# Patient Record
Sex: Male | Born: 1940 | Race: White | Hispanic: No | Smoking: Former smoker
Health system: Southern US, Community
[De-identification: ages and names within clinical notes are randomized; demographics above are authoritative.]

## PROBLEM LIST (undated history)

## (undated) DIAGNOSIS — I350 Nonrheumatic aortic (valve) stenosis: Secondary | ICD-10-CM

## (undated) DIAGNOSIS — M1990 Unspecified osteoarthritis, unspecified site: Secondary | ICD-10-CM

## (undated) DIAGNOSIS — I10 Essential (primary) hypertension: Secondary | ICD-10-CM

## (undated) DIAGNOSIS — Z720 Tobacco use: Secondary | ICD-10-CM

## (undated) DIAGNOSIS — J449 Chronic obstructive pulmonary disease, unspecified: Secondary | ICD-10-CM

## (undated) DIAGNOSIS — I5022 Chronic systolic (congestive) heart failure: Secondary | ICD-10-CM

## (undated) DIAGNOSIS — I639 Cerebral infarction, unspecified: Secondary | ICD-10-CM

## (undated) DIAGNOSIS — Q339 Congenital malformation of lung, unspecified: Secondary | ICD-10-CM

## (undated) DIAGNOSIS — I219 Acute myocardial infarction, unspecified: Secondary | ICD-10-CM

## (undated) DIAGNOSIS — I255 Ischemic cardiomyopathy: Secondary | ICD-10-CM

## (undated) DIAGNOSIS — Z7901 Long term (current) use of anticoagulants: Secondary | ICD-10-CM

## (undated) DIAGNOSIS — I739 Peripheral vascular disease, unspecified: Secondary | ICD-10-CM

## (undated) DIAGNOSIS — I6529 Occlusion and stenosis of unspecified carotid artery: Secondary | ICD-10-CM

## (undated) DIAGNOSIS — N183 Chronic kidney disease, stage 3 unspecified: Secondary | ICD-10-CM

## (undated) DIAGNOSIS — J189 Pneumonia, unspecified organism: Secondary | ICD-10-CM

## (undated) DIAGNOSIS — I2510 Atherosclerotic heart disease of native coronary artery without angina pectoris: Secondary | ICD-10-CM

## (undated) DIAGNOSIS — E785 Hyperlipidemia, unspecified: Secondary | ICD-10-CM

## (undated) DIAGNOSIS — E039 Hypothyroidism, unspecified: Secondary | ICD-10-CM

## (undated) HISTORY — PX: CAROTID STENT: SHX1301

## (undated) HISTORY — DX: Ischemic cardiomyopathy: I25.5

## (undated) HISTORY — PX: BACK SURGERY: SHX140

## (undated) HISTORY — DX: Hyperlipidemia, unspecified: E78.5

## (undated) HISTORY — DX: Atherosclerotic heart disease of native coronary artery without angina pectoris: I25.10

## (undated) HISTORY — DX: Occlusion and stenosis of unspecified carotid artery: I65.29

## (undated) HISTORY — DX: Peripheral vascular disease, unspecified: I73.9

## (undated) HISTORY — PX: LUMBAR DISC SURGERY: SHX700

## (undated) HISTORY — DX: Cerebral infarction, unspecified: I63.9

## (undated) HISTORY — DX: Essential (primary) hypertension: I10

## (undated) HISTORY — PX: INGUINAL HERNIA REPAIR: SUR1180

---

## 1983-06-28 DIAGNOSIS — I219 Acute myocardial infarction, unspecified: Secondary | ICD-10-CM

## 1983-06-28 HISTORY — DX: Acute myocardial infarction, unspecified: I21.9

## 1983-06-28 HISTORY — PX: CORONARY ARTERY BYPASS GRAFT: SHX141

## 1998-08-20 ENCOUNTER — Ambulatory Visit (HOSPITAL_COMMUNITY): Admission: RE | Admit: 1998-08-20 | Discharge: 1998-08-20 | Payer: Self-pay | Admitting: Neurosurgery

## 1998-08-20 ENCOUNTER — Encounter: Payer: Self-pay | Admitting: Neurosurgery

## 2002-04-01 ENCOUNTER — Encounter: Payer: Self-pay | Admitting: Emergency Medicine

## 2002-04-01 ENCOUNTER — Inpatient Hospital Stay (HOSPITAL_COMMUNITY): Admission: EM | Admit: 2002-04-01 | Discharge: 2002-04-03 | Payer: Self-pay | Admitting: Emergency Medicine

## 2002-04-01 ENCOUNTER — Encounter: Payer: Self-pay | Admitting: *Deleted

## 2002-04-23 ENCOUNTER — Encounter: Payer: Self-pay | Admitting: Cardiology

## 2002-04-23 ENCOUNTER — Ambulatory Visit (HOSPITAL_COMMUNITY): Admission: RE | Admit: 2002-04-23 | Discharge: 2002-04-23 | Payer: Self-pay | Admitting: Cardiology

## 2002-05-27 HISTORY — PX: CORONARY ANGIOPLASTY WITH STENT PLACEMENT: SHX49

## 2002-06-12 ENCOUNTER — Encounter: Payer: Self-pay | Admitting: *Deleted

## 2002-06-12 ENCOUNTER — Inpatient Hospital Stay (HOSPITAL_COMMUNITY): Admission: RE | Admit: 2002-06-12 | Discharge: 2002-06-16 | Payer: Self-pay | Admitting: *Deleted

## 2002-06-27 HISTORY — PX: AORTIC/RENAL BYPASS: SHX6299

## 2002-06-27 HISTORY — PX: AORTA - BILATERAL FEMORAL ARTERY BYPASS GRAFT: SHX1175

## 2002-08-12 ENCOUNTER — Encounter: Payer: Self-pay | Admitting: *Deleted

## 2002-08-14 ENCOUNTER — Encounter (INDEPENDENT_AMBULATORY_CARE_PROVIDER_SITE_OTHER): Payer: Self-pay | Admitting: Specialist

## 2002-08-14 ENCOUNTER — Inpatient Hospital Stay (HOSPITAL_COMMUNITY): Admission: RE | Admit: 2002-08-14 | Discharge: 2002-08-19 | Payer: Self-pay | Admitting: *Deleted

## 2002-08-14 ENCOUNTER — Encounter: Payer: Self-pay | Admitting: *Deleted

## 2002-08-15 ENCOUNTER — Encounter: Payer: Self-pay | Admitting: *Deleted

## 2002-12-17 ENCOUNTER — Encounter: Payer: Self-pay | Admitting: *Deleted

## 2002-12-19 ENCOUNTER — Ambulatory Visit (HOSPITAL_COMMUNITY): Admission: RE | Admit: 2002-12-19 | Discharge: 2002-12-19 | Payer: Self-pay | Admitting: *Deleted

## 2002-12-26 HISTORY — PX: CAROTID ENDARTERECTOMY: SUR193

## 2002-12-27 ENCOUNTER — Inpatient Hospital Stay (HOSPITAL_COMMUNITY): Admission: RE | Admit: 2002-12-27 | Discharge: 2002-12-28 | Payer: Self-pay | Admitting: *Deleted

## 2002-12-27 ENCOUNTER — Encounter (INDEPENDENT_AMBULATORY_CARE_PROVIDER_SITE_OTHER): Payer: Self-pay | Admitting: *Deleted

## 2003-02-28 ENCOUNTER — Emergency Department (HOSPITAL_COMMUNITY): Admission: EM | Admit: 2003-02-28 | Discharge: 2003-02-28 | Payer: Self-pay | Admitting: *Deleted

## 2003-03-07 ENCOUNTER — Encounter (INDEPENDENT_AMBULATORY_CARE_PROVIDER_SITE_OTHER): Payer: Self-pay | Admitting: Specialist

## 2003-03-07 ENCOUNTER — Ambulatory Visit (HOSPITAL_COMMUNITY): Admission: RE | Admit: 2003-03-07 | Discharge: 2003-03-07 | Payer: Self-pay | Admitting: Gastroenterology

## 2004-05-13 ENCOUNTER — Ambulatory Visit: Payer: Self-pay

## 2004-06-15 ENCOUNTER — Ambulatory Visit: Payer: Self-pay | Admitting: Internal Medicine

## 2004-06-30 ENCOUNTER — Ambulatory Visit: Payer: Self-pay | Admitting: Cardiology

## 2004-07-28 ENCOUNTER — Ambulatory Visit: Payer: Self-pay | Admitting: Internal Medicine

## 2004-07-28 ENCOUNTER — Ambulatory Visit: Payer: Self-pay | Admitting: Cardiology

## 2004-08-25 ENCOUNTER — Ambulatory Visit: Payer: Self-pay | Admitting: Cardiology

## 2004-08-30 ENCOUNTER — Ambulatory Visit: Payer: Self-pay | Admitting: Cardiology

## 2004-09-09 ENCOUNTER — Ambulatory Visit: Payer: Self-pay | Admitting: Cardiology

## 2004-09-09 ENCOUNTER — Ambulatory Visit: Payer: Self-pay

## 2004-09-22 ENCOUNTER — Ambulatory Visit: Payer: Self-pay | Admitting: *Deleted

## 2004-09-28 ENCOUNTER — Ambulatory Visit: Payer: Self-pay | Admitting: Cardiology

## 2004-10-20 ENCOUNTER — Ambulatory Visit: Payer: Self-pay | Admitting: Cardiology

## 2004-11-17 ENCOUNTER — Ambulatory Visit: Payer: Self-pay | Admitting: *Deleted

## 2004-12-21 ENCOUNTER — Ambulatory Visit: Payer: Self-pay | Admitting: Cardiology

## 2005-01-11 ENCOUNTER — Ambulatory Visit: Payer: Self-pay | Admitting: Cardiology

## 2005-02-08 ENCOUNTER — Ambulatory Visit: Payer: Self-pay | Admitting: Cardiology

## 2005-03-08 ENCOUNTER — Ambulatory Visit: Payer: Self-pay | Admitting: Cardiology

## 2005-04-05 ENCOUNTER — Ambulatory Visit: Payer: Self-pay | Admitting: Cardiovascular Disease

## 2005-05-03 ENCOUNTER — Ambulatory Visit: Payer: Self-pay | Admitting: Cardiology

## 2005-05-31 ENCOUNTER — Ambulatory Visit: Payer: Self-pay | Admitting: Cardiovascular Disease

## 2005-06-16 ENCOUNTER — Ambulatory Visit: Payer: Self-pay | Admitting: Cardiology

## 2005-06-28 ENCOUNTER — Ambulatory Visit: Payer: Self-pay | Admitting: Cardiology

## 2005-07-19 ENCOUNTER — Ambulatory Visit: Payer: Self-pay | Admitting: Cardiology

## 2005-08-16 ENCOUNTER — Ambulatory Visit: Payer: Self-pay | Admitting: Cardiology

## 2005-09-13 ENCOUNTER — Ambulatory Visit: Payer: Self-pay | Admitting: Cardiology

## 2005-09-27 ENCOUNTER — Ambulatory Visit: Payer: Self-pay | Admitting: Cardiology

## 2005-10-11 ENCOUNTER — Ambulatory Visit: Payer: Self-pay | Admitting: Cardiology

## 2005-11-08 ENCOUNTER — Ambulatory Visit: Payer: Self-pay | Admitting: Cardiology

## 2005-12-06 ENCOUNTER — Ambulatory Visit: Payer: Self-pay | Admitting: Cardiology

## 2006-01-03 ENCOUNTER — Ambulatory Visit: Payer: Self-pay | Admitting: Cardiology

## 2006-01-31 ENCOUNTER — Ambulatory Visit: Payer: Self-pay | Admitting: Cardiology

## 2006-02-28 ENCOUNTER — Ambulatory Visit: Payer: Self-pay | Admitting: Cardiovascular Disease

## 2006-03-14 ENCOUNTER — Ambulatory Visit: Payer: Self-pay | Admitting: Cardiology

## 2006-03-27 ENCOUNTER — Ambulatory Visit: Payer: Self-pay

## 2006-03-27 ENCOUNTER — Ambulatory Visit: Payer: Self-pay | Admitting: Internal Medicine

## 2006-03-27 ENCOUNTER — Ambulatory Visit: Payer: Self-pay | Admitting: Cardiology

## 2006-04-24 ENCOUNTER — Ambulatory Visit: Payer: Self-pay | Admitting: Cardiology

## 2006-05-22 ENCOUNTER — Ambulatory Visit: Payer: Self-pay | Admitting: Cardiology

## 2006-06-21 ENCOUNTER — Ambulatory Visit: Payer: Self-pay | Admitting: Cardiology

## 2006-07-19 ENCOUNTER — Ambulatory Visit: Payer: Self-pay | Admitting: Cardiovascular Disease

## 2006-08-16 ENCOUNTER — Ambulatory Visit: Payer: Self-pay | Admitting: Cardiology

## 2006-09-13 ENCOUNTER — Ambulatory Visit: Payer: Self-pay | Admitting: Cardiology

## 2006-10-11 ENCOUNTER — Ambulatory Visit: Payer: Self-pay | Admitting: Internal Medicine

## 2006-10-30 ENCOUNTER — Ambulatory Visit: Payer: Self-pay | Admitting: Cardiology

## 2006-11-15 ENCOUNTER — Ambulatory Visit: Payer: Self-pay | Admitting: Cardiology

## 2006-12-06 ENCOUNTER — Ambulatory Visit: Payer: Self-pay | Admitting: Cardiovascular Disease

## 2007-01-03 ENCOUNTER — Ambulatory Visit: Payer: Self-pay | Admitting: Cardiology

## 2007-01-17 ENCOUNTER — Ambulatory Visit: Payer: Self-pay | Admitting: Cardiology

## 2007-01-17 LAB — CONVERTED CEMR LAB
ALT: 20 units/L (ref 0–53)
AST: 22 units/L (ref 0–37)
Albumin: 3.8 g/dL (ref 3.5–5.2)
Alkaline Phosphatase: 117 units/L (ref 39–117)
BUN: 18 mg/dL (ref 6–23)
Basophils Absolute: 0 10*3/uL (ref 0.0–0.1)
Basophils Relative: 0 % (ref 0.0–1.0)
Bilirubin, Direct: 0.1 mg/dL (ref 0.0–0.3)
CO2: 27 meq/L (ref 19–32)
Calcium: 9.3 mg/dL (ref 8.4–10.5)
Chloride: 107 meq/L (ref 96–112)
Cholesterol: 177 mg/dL (ref 0–200)
Creatinine, Ser: 1.4 mg/dL (ref 0.4–1.5)
Eosinophils Absolute: 0.2 10*3/uL (ref 0.0–0.6)
Eosinophils Relative: 1.6 % (ref 0.0–5.0)
GFR calc Af Amer: 65 mL/min
GFR calc non Af Amer: 54 mL/min
Glucose, Bld: 101 mg/dL — ABNORMAL HIGH (ref 70–99)
HCT: 40.3 % (ref 39.0–52.0)
HDL: 35.9 mg/dL — ABNORMAL LOW (ref >39.0)
Hemoglobin: 13.7 g/dL (ref 13.0–17.0)
LDL Cholesterol: 114 mg/dL — ABNORMAL HIGH (ref 0–99)
Lymphocytes Relative: 15.8 % (ref 12.0–46.0)
MCHC: 34.1 g/dL (ref 30.0–36.0)
MCV: 88.1 fL (ref 78.0–100.0)
Monocytes Absolute: 0.9 10*3/uL — ABNORMAL HIGH (ref 0.2–0.7)
Monocytes Relative: 9.6 % (ref 3.0–11.0)
Neutro Abs: 7 10*3/uL (ref 1.4–7.7)
Neutrophils Relative %: 73 % (ref 43.0–77.0)
Platelets: 258 10*3/uL (ref 150–400)
Potassium: 4 meq/L (ref 3.5–5.1)
RBC: 4.57 M/uL (ref 4.22–5.81)
RDW: 14.9 % — ABNORMAL HIGH (ref 11.5–14.6)
Sodium: 141 meq/L (ref 135–145)
Total Bilirubin: 0.4 mg/dL (ref 0.3–1.2)
Total CHOL/HDL Ratio: 4.9
Total Protein: 7 g/dL (ref 6.0–8.3)
Triglycerides: 136 mg/dL (ref 0–149)
VLDL: 27 mg/dL (ref 0–40)
WBC: 9.6 10*3/uL (ref 4.5–10.5)

## 2007-01-24 ENCOUNTER — Ambulatory Visit: Payer: Self-pay | Admitting: Internal Medicine

## 2007-02-12 ENCOUNTER — Ambulatory Visit: Payer: Self-pay

## 2007-02-21 ENCOUNTER — Ambulatory Visit: Payer: Self-pay | Admitting: Internal Medicine

## 2007-02-22 ENCOUNTER — Ambulatory Visit: Payer: Self-pay | Admitting: *Deleted

## 2007-03-21 ENCOUNTER — Ambulatory Visit: Payer: Self-pay | Admitting: Cardiology

## 2007-04-17 ENCOUNTER — Ambulatory Visit: Payer: Self-pay | Admitting: Cardiology

## 2007-05-15 ENCOUNTER — Ambulatory Visit: Payer: Self-pay | Admitting: Cardiology

## 2007-06-12 ENCOUNTER — Ambulatory Visit: Payer: Self-pay | Admitting: Cardiology

## 2007-06-15 ENCOUNTER — Inpatient Hospital Stay (HOSPITAL_COMMUNITY): Admission: RE | Admit: 2007-06-15 | Discharge: 2007-06-16 | Payer: Self-pay | Admitting: *Deleted

## 2007-06-15 ENCOUNTER — Ambulatory Visit: Payer: Self-pay | Admitting: *Deleted

## 2007-06-27 ENCOUNTER — Ambulatory Visit: Payer: Self-pay | Admitting: Cardiovascular Disease

## 2007-07-05 ENCOUNTER — Ambulatory Visit: Payer: Self-pay | Admitting: Cardiology

## 2007-07-12 ENCOUNTER — Ambulatory Visit: Payer: Self-pay | Admitting: *Deleted

## 2007-08-02 ENCOUNTER — Ambulatory Visit: Payer: Self-pay | Admitting: Cardiology

## 2007-08-20 ENCOUNTER — Ambulatory Visit: Payer: Self-pay | Admitting: Internal Medicine

## 2007-09-17 ENCOUNTER — Ambulatory Visit: Payer: Self-pay | Admitting: Cardiovascular Disease

## 2007-10-15 ENCOUNTER — Ambulatory Visit: Payer: Self-pay | Admitting: Internal Medicine

## 2007-11-21 ENCOUNTER — Ambulatory Visit: Payer: Self-pay | Admitting: Cardiology

## 2007-11-29 ENCOUNTER — Ambulatory Visit: Payer: Self-pay | Admitting: *Deleted

## 2007-11-30 ENCOUNTER — Ambulatory Visit: Payer: Self-pay | Admitting: Internal Medicine

## 2007-12-12 ENCOUNTER — Ambulatory Visit: Payer: Self-pay | Admitting: Internal Medicine

## 2007-12-12 ENCOUNTER — Ambulatory Visit: Payer: Self-pay | Admitting: Cardiology

## 2007-12-31 ENCOUNTER — Ambulatory Visit: Payer: Self-pay | Admitting: Cardiology

## 2007-12-31 LAB — CONVERTED CEMR LAB
ALT: 22 units/L (ref 0–53)
AST: 27 units/L (ref 0–37)
Albumin: 3.9 g/dL (ref 3.5–5.2)
Alkaline Phosphatase: 115 units/L (ref 39–117)
BUN: 23 mg/dL (ref 6–23)
Basophils Absolute: 0 10*3/uL (ref 0.0–0.1)
Basophils Relative: 0.2 % (ref 0.0–1.0)
Bilirubin, Direct: 0.1 mg/dL (ref 0.0–0.3)
CO2: 27 meq/L (ref 19–32)
Calcium: 9.4 mg/dL (ref 8.4–10.5)
Chloride: 106 meq/L (ref 96–112)
Cholesterol: 173 mg/dL (ref 0–200)
Creatinine, Ser: 1.6 mg/dL — ABNORMAL HIGH (ref 0.4–1.5)
Eosinophils Absolute: 0.2 10*3/uL (ref 0.0–0.7)
Eosinophils Relative: 1.7 % (ref 0.0–5.0)
GFR calc Af Amer: 56 mL/min
GFR calc non Af Amer: 46 mL/min
Glucose, Bld: 107 mg/dL — ABNORMAL HIGH (ref 70–99)
HCT: 43.8 % (ref 39.0–52.0)
HDL: 36.6 mg/dL — ABNORMAL LOW (ref >39.0)
Hemoglobin: 15.3 g/dL (ref 13.0–17.0)
LDL Cholesterol: 101 mg/dL — ABNORMAL HIGH (ref 0–99)
Lymphocytes Relative: 17.5 % (ref 12.0–46.0)
MCHC: 34.9 g/dL (ref 30.0–36.0)
MCV: 85.5 fL (ref 78.0–100.0)
Monocytes Absolute: 0.8 10*3/uL (ref 0.1–1.0)
Monocytes Relative: 8.2 % (ref 3.0–12.0)
Neutro Abs: 7.2 10*3/uL (ref 1.4–7.7)
Neutrophils Relative %: 72.4 % (ref 43.0–77.0)
Platelets: 226 10*3/uL (ref 150–400)
Potassium: 4.4 meq/L (ref 3.5–5.1)
RBC: 5.12 M/uL (ref 4.22–5.81)
RDW: 14.8 % — ABNORMAL HIGH (ref 11.5–14.6)
Sodium: 141 meq/L (ref 135–145)
Total Bilirubin: 0.6 mg/dL (ref 0.3–1.2)
Total CHOL/HDL Ratio: 4.7
Total Protein: 7.1 g/dL (ref 6.0–8.3)
Triglycerides: 176 mg/dL — ABNORMAL HIGH (ref 0–149)
VLDL: 35 mg/dL (ref 0–40)
WBC: 9.9 10*3/uL (ref 4.5–10.5)

## 2008-01-02 ENCOUNTER — Ambulatory Visit: Payer: Self-pay | Admitting: Cardiovascular Disease

## 2008-01-30 ENCOUNTER — Ambulatory Visit: Payer: Self-pay | Admitting: Internal Medicine

## 2008-03-07 ENCOUNTER — Ambulatory Visit: Payer: Self-pay | Admitting: Internal Medicine

## 2008-03-21 ENCOUNTER — Ambulatory Visit: Payer: Self-pay | Admitting: Cardiology

## 2008-04-25 ENCOUNTER — Ambulatory Visit: Payer: Self-pay | Admitting: Internal Medicine

## 2008-06-02 ENCOUNTER — Ambulatory Visit: Payer: Self-pay | Admitting: Internal Medicine

## 2008-06-30 ENCOUNTER — Ambulatory Visit: Payer: Self-pay | Admitting: Cardiology

## 2008-06-30 DIAGNOSIS — F17200 Nicotine dependence, unspecified, uncomplicated: Secondary | ICD-10-CM | POA: Insufficient documentation

## 2008-06-30 DIAGNOSIS — I25810 Atherosclerosis of coronary artery bypass graft(s) without angina pectoris: Secondary | ICD-10-CM

## 2008-06-30 DIAGNOSIS — E785 Hyperlipidemia, unspecified: Secondary | ICD-10-CM

## 2008-06-30 DIAGNOSIS — I6350 Cerebral infarction due to unspecified occlusion or stenosis of unspecified cerebral artery: Secondary | ICD-10-CM | POA: Insufficient documentation

## 2008-06-30 DIAGNOSIS — I739 Peripheral vascular disease, unspecified: Secondary | ICD-10-CM

## 2008-06-30 DIAGNOSIS — I10 Essential (primary) hypertension: Secondary | ICD-10-CM

## 2008-06-30 DIAGNOSIS — I2589 Other forms of chronic ischemic heart disease: Secondary | ICD-10-CM

## 2008-07-03 ENCOUNTER — Ambulatory Visit: Payer: Self-pay | Admitting: Cardiology

## 2008-07-07 ENCOUNTER — Ambulatory Visit: Payer: Self-pay | Admitting: *Deleted

## 2008-07-11 ENCOUNTER — Ambulatory Visit: Payer: Self-pay

## 2008-07-11 ENCOUNTER — Ambulatory Visit: Payer: Self-pay | Admitting: Cardiology

## 2008-07-11 LAB — CONVERTED CEMR LAB
ALT: 18 units/L (ref 0–53)
AST: 20 units/L (ref 0–37)
Albumin: 3.9 g/dL (ref 3.5–5.2)
Alkaline Phosphatase: 105 units/L (ref 39–117)
BUN: 21 mg/dL (ref 6–23)
Basophils Absolute: 0 10*3/uL (ref 0.0–0.1)
Basophils Relative: 0.3 % (ref 0.0–3.0)
Bilirubin, Direct: 0.1 mg/dL (ref 0.0–0.3)
CO2: 28 meq/L (ref 19–32)
Calcium: 9 mg/dL (ref 8.4–10.5)
Chloride: 105 meq/L (ref 96–112)
Cholesterol: 180 mg/dL (ref 0–200)
Creatinine, Ser: 1.3 mg/dL (ref 0.4–1.5)
Eosinophils Absolute: 0.1 10*3/uL (ref 0.0–0.7)
Eosinophils Relative: 1.2 % (ref 0.0–5.0)
GFR calc Af Amer: 71 mL/min
GFR calc non Af Amer: 58 mL/min
Glucose, Bld: 90 mg/dL (ref 70–99)
HCT: 42.9 % (ref 39.0–52.0)
HDL: 36.5 mg/dL — ABNORMAL LOW (ref >39.0)
Hemoglobin: 14.9 g/dL (ref 13.0–17.0)
LDL Cholesterol: 123 mg/dL — ABNORMAL HIGH (ref 0–99)
Lymphocytes Relative: 19.4 % (ref 12.0–46.0)
MCHC: 34.6 g/dL (ref 30.0–36.0)
MCV: 87.5 fL (ref 78.0–100.0)
Monocytes Absolute: 0.7 10*3/uL (ref 0.1–1.0)
Monocytes Relative: 8.1 % (ref 3.0–12.0)
Neutro Abs: 6.2 10*3/uL (ref 1.4–7.7)
Neutrophils Relative %: 71 % (ref 43.0–77.0)
Platelets: 216 10*3/uL (ref 150–400)
Potassium: 4.7 meq/L (ref 3.5–5.1)
RBC: 4.91 M/uL (ref 4.22–5.81)
RDW: 13.8 % (ref 11.5–14.6)
Sodium: 139 meq/L (ref 135–145)
Total Bilirubin: 0.8 mg/dL (ref 0.3–1.2)
Total CHOL/HDL Ratio: 4.9
Total Protein: 7.2 g/dL (ref 6.0–8.3)
Triglycerides: 103 mg/dL (ref 0–149)
VLDL: 21 mg/dL (ref 0–40)
WBC: 8.7 10*3/uL (ref 4.5–10.5)

## 2008-07-24 ENCOUNTER — Ambulatory Visit: Payer: Self-pay | Admitting: *Deleted

## 2008-07-25 ENCOUNTER — Ambulatory Visit: Payer: Self-pay | Admitting: Cardiology

## 2008-07-25 ENCOUNTER — Ambulatory Visit: Payer: Self-pay | Admitting: Internal Medicine

## 2008-07-25 ENCOUNTER — Ambulatory Visit: Payer: Self-pay

## 2008-07-25 ENCOUNTER — Encounter: Payer: Self-pay | Admitting: Cardiology

## 2008-08-06 ENCOUNTER — Ambulatory Visit (HOSPITAL_COMMUNITY): Admission: RE | Admit: 2008-08-06 | Discharge: 2008-08-06 | Payer: Self-pay | Admitting: *Deleted

## 2008-08-06 ENCOUNTER — Ambulatory Visit: Payer: Self-pay | Admitting: *Deleted

## 2008-08-08 ENCOUNTER — Encounter: Payer: Self-pay | Admitting: Cardiology

## 2008-08-26 ENCOUNTER — Encounter: Payer: Self-pay | Admitting: Cardiology

## 2008-08-27 ENCOUNTER — Ambulatory Visit: Payer: Self-pay | Admitting: Cardiology

## 2008-08-27 ENCOUNTER — Ambulatory Visit: Payer: Self-pay | Admitting: Internal Medicine

## 2008-08-27 LAB — CONVERTED CEMR LAB
ALT: 19 units/L (ref 0–53)
AST: 22 units/L (ref 0–37)
Bilirubin, Direct: 0.1 mg/dL (ref 0.0–0.3)
HDL: 40.9 mg/dL (ref >39.0)
Total Bilirubin: 0.7 mg/dL (ref 0.3–1.2)

## 2008-09-24 ENCOUNTER — Ambulatory Visit: Payer: Self-pay | Admitting: Cardiology

## 2008-10-06 ENCOUNTER — Telehealth (INDEPENDENT_AMBULATORY_CARE_PROVIDER_SITE_OTHER): Payer: Self-pay | Admitting: *Deleted

## 2008-10-10 ENCOUNTER — Encounter: Payer: Self-pay | Admitting: Cardiology

## 2008-10-10 ENCOUNTER — Ambulatory Visit: Payer: Self-pay | Admitting: Cardiology

## 2008-10-10 DIAGNOSIS — I6529 Occlusion and stenosis of unspecified carotid artery: Secondary | ICD-10-CM

## 2008-10-10 LAB — CONVERTED CEMR LAB
Albumin: 3.8 g/dL (ref 3.5–5.2)
BUN: 19 mg/dL (ref 6–23)
Calcium: 9.2 mg/dL (ref 8.4–10.5)
Creatinine, Ser: 1.3 mg/dL (ref 0.4–1.5)
GFR calc non Af Amer: 58.22 mL/min (ref >60)
Glucose, Bld: 85 mg/dL (ref 70–99)
Sodium: 138 meq/L (ref 135–145)

## 2008-10-22 ENCOUNTER — Inpatient Hospital Stay (HOSPITAL_COMMUNITY): Admission: RE | Admit: 2008-10-22 | Discharge: 2008-10-23 | Payer: Self-pay | Admitting: *Deleted

## 2008-10-22 ENCOUNTER — Ambulatory Visit: Payer: Self-pay | Admitting: *Deleted

## 2008-11-19 ENCOUNTER — Ambulatory Visit: Payer: Self-pay | Admitting: Cardiology

## 2008-11-20 ENCOUNTER — Ambulatory Visit: Payer: Self-pay | Admitting: *Deleted

## 2008-11-26 ENCOUNTER — Encounter: Payer: Self-pay | Admitting: *Deleted

## 2008-12-17 ENCOUNTER — Ambulatory Visit: Payer: Self-pay | Admitting: Cardiology

## 2008-12-17 LAB — CONVERTED CEMR LAB: Prothrombin Time: 17.3 s

## 2008-12-24 ENCOUNTER — Encounter: Payer: Self-pay | Admitting: Cardiology

## 2008-12-25 ENCOUNTER — Encounter: Payer: Self-pay | Admitting: Cardiology

## 2008-12-31 ENCOUNTER — Encounter: Payer: Self-pay | Admitting: *Deleted

## 2009-01-02 ENCOUNTER — Telehealth (INDEPENDENT_AMBULATORY_CARE_PROVIDER_SITE_OTHER): Payer: Self-pay | Admitting: *Deleted

## 2009-01-13 ENCOUNTER — Encounter: Payer: Self-pay | Admitting: Cardiology

## 2009-01-28 ENCOUNTER — Ambulatory Visit (HOSPITAL_COMMUNITY): Admission: RE | Admit: 2009-01-28 | Discharge: 2009-01-28 | Payer: Self-pay | Admitting: General Surgery

## 2009-02-04 ENCOUNTER — Encounter (INDEPENDENT_AMBULATORY_CARE_PROVIDER_SITE_OTHER): Payer: Self-pay | Admitting: *Deleted

## 2009-02-18 ENCOUNTER — Ambulatory Visit: Payer: Self-pay | Admitting: Cardiovascular Disease

## 2009-02-18 LAB — CONVERTED CEMR LAB: POC INR: 2.5

## 2009-03-18 ENCOUNTER — Ambulatory Visit: Payer: Self-pay | Admitting: Cardiovascular Disease

## 2009-03-18 LAB — CONVERTED CEMR LAB: POC INR: 2.7

## 2009-04-23 ENCOUNTER — Ambulatory Visit: Payer: Self-pay | Admitting: Cardiovascular Disease

## 2009-05-14 ENCOUNTER — Ambulatory Visit: Payer: Self-pay | Admitting: Cardiology

## 2009-05-20 LAB — CONVERTED CEMR LAB
AST: 22 units/L (ref 0–37)
Albumin: 4 g/dL (ref 3.5–5.2)
Alkaline Phosphatase: 111 units/L (ref 39–117)
BUN: 23 mg/dL (ref 6–23)
Cholesterol: 344 mg/dL — ABNORMAL HIGH (ref 0–200)
Creatinine, Ser: 1.5 mg/dL (ref 0.4–1.5)
GFR calc non Af Amer: 49.27 mL/min (ref >60)
Total CHOL/HDL Ratio: 10
Total Protein: 7.5 g/dL (ref 6.0–8.3)

## 2009-05-25 ENCOUNTER — Ambulatory Visit: Payer: Self-pay | Admitting: Cardiology

## 2009-06-05 ENCOUNTER — Ambulatory Visit: Payer: Self-pay | Admitting: Vascular Surgery

## 2009-06-25 ENCOUNTER — Ambulatory Visit: Payer: Self-pay | Admitting: Cardiovascular Disease

## 2009-07-29 ENCOUNTER — Ambulatory Visit: Payer: Self-pay | Admitting: Cardiovascular Disease

## 2009-07-29 LAB — CONVERTED CEMR LAB: POC INR: 2.6

## 2009-08-26 ENCOUNTER — Ambulatory Visit: Payer: Self-pay | Admitting: Cardiovascular Disease

## 2009-09-23 ENCOUNTER — Ambulatory Visit: Payer: Self-pay | Admitting: Internal Medicine

## 2009-09-23 LAB — CONVERTED CEMR LAB: POC INR: 2.4

## 2009-10-21 ENCOUNTER — Ambulatory Visit: Payer: Self-pay | Admitting: Cardiovascular Disease

## 2009-10-21 LAB — CONVERTED CEMR LAB: POC INR: 2.7

## 2009-11-18 ENCOUNTER — Ambulatory Visit: Payer: Self-pay | Admitting: Internal Medicine

## 2009-11-18 LAB — CONVERTED CEMR LAB: POC INR: 2.2

## 2009-12-16 ENCOUNTER — Ambulatory Visit: Payer: Self-pay | Admitting: Cardiology

## 2010-01-08 ENCOUNTER — Ambulatory Visit: Payer: Self-pay | Admitting: Cardiology

## 2010-02-03 ENCOUNTER — Telehealth (INDEPENDENT_AMBULATORY_CARE_PROVIDER_SITE_OTHER): Payer: Self-pay | Admitting: *Deleted

## 2010-02-04 ENCOUNTER — Ambulatory Visit: Payer: Self-pay | Admitting: Internal Medicine

## 2010-02-04 ENCOUNTER — Encounter: Payer: Self-pay | Admitting: Internal Medicine

## 2010-02-04 ENCOUNTER — Ambulatory Visit: Payer: Self-pay | Admitting: Cardiology

## 2010-02-04 ENCOUNTER — Ambulatory Visit: Payer: Self-pay

## 2010-02-04 ENCOUNTER — Encounter (HOSPITAL_COMMUNITY): Admission: RE | Admit: 2010-02-04 | Discharge: 2010-03-16 | Payer: Self-pay | Admitting: Cardiology

## 2010-02-04 LAB — CONVERTED CEMR LAB: POC INR: 2.4

## 2010-02-08 LAB — CONVERTED CEMR LAB
Alkaline Phosphatase: 108 units/L (ref 39–117)
BUN: 30 mg/dL — ABNORMAL HIGH (ref 6–23)
Basophils Absolute: 0.1 10*3/uL (ref 0.0–0.1)
Basophils Relative: 0.7 % (ref 0.0–3.0)
Bilirubin, Direct: 0.1 mg/dL (ref 0.0–0.3)
Calcium: 9.6 mg/dL (ref 8.4–10.5)
Creatinine, Ser: 1.4 mg/dL (ref 0.4–1.5)
Direct LDL: 297.7 mg/dL
Eosinophils Absolute: 0.1 10*3/uL (ref 0.0–0.7)
GFR calc non Af Amer: 51.54 mL/min (ref >60)
HCT: 46.8 % (ref 39.0–52.0)
Hemoglobin: 15.8 g/dL (ref 13.0–17.0)
Lymphocytes Relative: 17.3 % (ref 12.0–46.0)
Lymphs Abs: 1.7 10*3/uL (ref 0.7–4.0)
MCHC: 33.8 g/dL (ref 30.0–36.0)
MCV: 89.4 fL (ref 78.0–100.0)
Monocytes Absolute: 0.8 10*3/uL (ref 0.1–1.0)
Neutro Abs: 6.9 10*3/uL (ref 1.4–7.7)
RDW: 15.4 % — ABNORMAL HIGH (ref 11.5–14.6)
Total Bilirubin: 0.4 mg/dL (ref 0.3–1.2)
Total Protein: 7.4 g/dL (ref 6.0–8.3)
VLDL: 44.4 mg/dL — ABNORMAL HIGH (ref 0.0–40.0)

## 2010-03-02 ENCOUNTER — Ambulatory Visit: Payer: Self-pay | Admitting: Cardiology

## 2010-03-04 ENCOUNTER — Ambulatory Visit: Payer: Self-pay | Admitting: Cardiology

## 2010-03-04 LAB — CONVERTED CEMR LAB: POC INR: 2.5

## 2010-03-31 ENCOUNTER — Ambulatory Visit: Payer: Self-pay | Admitting: Cardiology

## 2010-03-31 ENCOUNTER — Ambulatory Visit: Payer: Self-pay | Admitting: Cardiovascular Disease

## 2010-03-31 ENCOUNTER — Encounter (INDEPENDENT_AMBULATORY_CARE_PROVIDER_SITE_OTHER): Payer: Self-pay | Admitting: *Deleted

## 2010-03-31 LAB — CONVERTED CEMR LAB
ALT: 16 units/L (ref 0–53)
Albumin: 3.7 g/dL (ref 3.5–5.2)
HDL: 44.4 mg/dL (ref >39.00)
Total CHOL/HDL Ratio: 3
Total Protein: 6.7 g/dL (ref 6.0–8.3)
Triglycerides: 118 mg/dL (ref 0.0–149.0)

## 2010-04-27 ENCOUNTER — Ambulatory Visit: Payer: Self-pay | Admitting: Cardiovascular Disease

## 2010-04-27 ENCOUNTER — Ambulatory Visit: Payer: Self-pay | Admitting: Cardiology

## 2010-04-27 LAB — CONVERTED CEMR LAB: POC INR: 2.6

## 2010-05-05 LAB — CONVERTED CEMR LAB
ALT: 20 units/L (ref 0–53)
BUN: 23 mg/dL (ref 6–23)
Bilirubin, Direct: 0.1 mg/dL (ref 0.0–0.3)
Chloride: 104 meq/L (ref 96–112)
Direct LDL: 159.9 mg/dL
GFR calc non Af Amer: 57.45 mL/min (ref >60)
HDL: 49.1 mg/dL (ref >39.00)
Potassium: 4.3 meq/L (ref 3.5–5.1)
Sodium: 138 meq/L (ref 135–145)
Total Bilirubin: 0.7 mg/dL (ref 0.3–1.2)
Total Protein: 7.5 g/dL (ref 6.0–8.3)
Triglycerides: 140 mg/dL (ref 0.0–149.0)
VLDL: 28 mg/dL (ref 0.0–40.0)

## 2010-05-26 ENCOUNTER — Ambulatory Visit: Payer: Self-pay | Admitting: Cardiology

## 2010-05-26 ENCOUNTER — Ambulatory Visit (HOSPITAL_COMMUNITY)
Admission: RE | Admit: 2010-05-26 | Discharge: 2010-05-26 | Payer: Self-pay | Source: Home / Self Care | Admitting: Cardiology

## 2010-05-26 ENCOUNTER — Ambulatory Visit: Payer: Self-pay

## 2010-05-26 ENCOUNTER — Encounter: Payer: Self-pay | Admitting: Cardiology

## 2010-06-02 ENCOUNTER — Encounter (INDEPENDENT_AMBULATORY_CARE_PROVIDER_SITE_OTHER): Payer: Self-pay | Admitting: *Deleted

## 2010-06-22 ENCOUNTER — Ambulatory Visit: Payer: Self-pay | Admitting: Cardiology

## 2010-06-22 ENCOUNTER — Ambulatory Visit: Payer: Self-pay | Admitting: Internal Medicine

## 2010-06-29 ENCOUNTER — Ambulatory Visit (HOSPITAL_COMMUNITY)
Admission: RE | Admit: 2010-06-29 | Discharge: 2010-06-29 | Payer: Self-pay | Source: Home / Self Care | Attending: Cardiology | Admitting: Cardiology

## 2010-07-06 ENCOUNTER — Other Ambulatory Visit: Payer: Self-pay | Admitting: Cardiology

## 2010-07-06 LAB — LIPID PANEL
Cholesterol: 310 mg/dL — ABNORMAL HIGH (ref 0–200)
HDL: 48.2 mg/dL (ref >39.00)
Total CHOL/HDL Ratio: 6
Triglycerides: 217 mg/dL — ABNORMAL HIGH (ref 0.0–149.0)
VLDL: 43.4 mg/dL — ABNORMAL HIGH (ref 0.0–40.0)

## 2010-07-06 LAB — HEPATIC FUNCTION PANEL
ALT: 20 U/L (ref 0–53)
AST: 25 U/L (ref 0–37)
Albumin: 3.9 g/dL (ref 3.5–5.2)
Alkaline Phosphatase: 111 U/L (ref 39–117)
Bilirubin, Direct: 0.1 mg/dL (ref 0.0–0.3)
Total Bilirubin: 0.5 mg/dL (ref 0.3–1.2)
Total Protein: 7.3 g/dL (ref 6.0–8.3)

## 2010-07-06 LAB — LDL CHOLESTEROL, DIRECT: Direct LDL: 243.5 mg/dL

## 2010-07-06 LAB — CONVERTED CEMR LAB: POC INR: 2.8

## 2010-07-27 NOTE — Medication Information (Signed)
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 no    Recent/future dental: no  Any missed doses?: no       Is patient compliant with meds? yes       Allergies: 1)  Codeine Phosphate (Codeine Phosphate)  Anticoagulation Management History:      The patient is taking warfarin and comes in today for a routine follow up visit.  Positive risk factors for bleeding include an age of 70 years or older and history of CVA/TIA.  The bleeding index is 'intermediate risk'.  Positive CHADS2 values include History of HTN and Prior Stroke/CVA/TIA.  Negative CHADS2 values include Age > 25 years old.  The start date was 03/29/2002.  Anticoagulation responsible provider: Riley Kill MD, Maisie Fus.  INR POC: 2.2.  Cuvette Lot#: [id number].  Exp: 02/2011.    Anticoagulation Management Assessment/Plan:      The patient's current anticoagulation dose is Coumadin 6 mg tabs: Take as directed by coumadin clinic.  The target INR is 2.0-3.0.  The next INR is due 02/05/2010.  Anticoagulation instructions were given to patient.  Results were reviewed/authorized by Cloyde Reams, RN, BSN.  He was notified by Cloyde Reams RN.         Prior Anticoagulation Instructions: INR 1.9  Take an extra 1/2 tablet today then resume same dose of 1 tablet every day.    Current Anticoagulation Instructions: INR 2.2  Continue on same dosage 6mg  daily.  Recheck in 4 weeks.

## 2010-07-27 NOTE — Medication Information (Signed)
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 no    Recent/future dental: no  Any missed doses?: no       Is patient compliant with meds? yes       Allergies: 1)  Codeine Phosphate (Codeine Phosphate)  Anticoagulation Management History:      The patient is taking warfarin and comes in today for a routine follow up visit.  Positive risk factors for bleeding include an age of 70 years or older and history of CVA/TIA.  The bleeding index is 'intermediate risk'.  Positive CHADS2 values include History of HTN and Prior Stroke/CVA/TIA.  Negative CHADS2 values include Age > 68 years old.  The start date was 03/29/2002.  Anticoagulation responsible provider: Bensimhon MD, Reuel Boom.  INR POC: 2.4.  Cuvette Lot#: [id number].  Exp: 10/2010.    Anticoagulation Management Assessment/Plan:      The patient's current anticoagulation dose is Coumadin 6 mg tabs: Take as directed by coumadin clinic.  The target INR is 2.0-3.0.  The next INR is due 10/21/2009.  Anticoagulation instructions were given to patient.  Results were reviewed/authorized by Eda Keys, PharmD.  He was notified by Eda Keys.         Prior Anticoagulation Instructions: INR 2.4  Continue on same dosage 6mg  daily.  Recheck in 4 weeks.    Current Anticoagulation Instructions: INR 2.4  Continue taking 1 tablet (6 mg) daily.  Return to clinic in 4 weeks.

## 2010-07-27 NOTE — Medication Information (Signed)
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 no    Recent/future dental: no  Any missed doses?: no       Is patient compliant with meds? yes       Allergies: 1)  Codeine Phosphate (Codeine Phosphate)  Anticoagulation Management History:      The patient is taking warfarin and comes in today for a routine follow up visit.  Positive risk factors for bleeding include an age of 70 years or older and history of CVA/TIA.  The bleeding index is 'intermediate risk'.  Positive CHADS2 values include History of HTN and Prior Stroke/CVA/TIA.  Negative CHADS2 values include Age > 84 years old.  The start date was 03/29/2002.  Anticoagulation responsible provider: nahser, phillip.  INR POC: 2.6.  Cuvette Lot#: [id number].  Exp: 04/2011.    Anticoagulation Management Assessment/Plan:      The patient's current anticoagulation dose is Coumadin 6 mg tabs: Take as directed by coumadin clinic.  The target INR is 2.0-3.0.  The next INR is due 05/25/2010.  Anticoagulation instructions were given to patient.  Results were reviewed/authorized by Weston Brass, PharmD.  He was notified by Weston Brass PharmD.         Prior Anticoagulation Instructions: INR 3.2  Tomorrow, Thursday, October 6th, take Coumadin 0.5 tab (3 mg).  Then, continue taking Coumadin 1 tab (6 mg) every day. Return to clinic in 4 weeks to coincide with MD appointment.   Current Anticoagulation Instructions: INR 2.6  Continue same dose of 1 tablet every day.   Recheck INR in 4 weeks.

## 2010-07-27 NOTE — Progress Notes (Signed)
Summary: Nuc Pre-Procedure  Phone Note Outgoing Call Call back at [facility] Phone [phone]   Call placed by: Antionette Char RN,  February 03, 2010 1:40 PM Call placed to: Patient Reason for Call: Confirm/change Appt Summary of Call: Left message with information on Myoview Information Sheet (see scanned document for details).     Nuclear Med Background Indications for Stress Test: Evaluation for Ischemia, Graft Patency, PTCA Patency   History: Angioplasty, CABG, Echo, Heart Catheterization, Myocardial Perfusion Study  History Comments: 1985 CABG x 4 2003- Angioplasty- Cfx,RI 1/10 MPS Lat. and Ant. Lateral ischemia, EF-35% 1/10 Echo 40%       Nuclear Pre-Procedure Cardiac Risk Factors: Carotid Disease, Hypertension, LBBB, Lipids, PVD, Smoker Height (in): 71  Nuclear Med Study Referring MD:  Olga Millers MD

## 2010-07-27 NOTE — Medication Information (Signed)
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 no    Recent/future dental: no  Any missed doses?: no       Is patient compliant with meds? yes       Allergies: 1)  Codeine Phosphate (Codeine Phosphate)  Anticoagulation Management History:      The patient is taking warfarin and comes in today for a routine follow up visit.  Positive risk factors for bleeding include an age of 70 years or older and history of CVA/TIA.  The bleeding index is 'intermediate risk'.  Positive CHADS2 values include History of HTN and Prior Stroke/CVA/TIA.  Negative CHADS2 values include Age > 41 years old.  The start date was 03/29/2002.  Anticoagulation responsible provider: Rosario Kushner MD, Reuel Boom.  INR POC: 2.2.  Cuvette Lot#: [id number].  Exp: 01/2011.    Anticoagulation Management Assessment/Plan:      The patient's current anticoagulation dose is Coumadin 6 mg tabs: Take as directed by coumadin clinic.  The target INR is 2.0-3.0.  The next INR is due 12/16/2009.  Anticoagulation instructions were given to patient.  Results were reviewed/authorized by Weston Brass, PharmD.  He was notified by Weston Brass PharmD.         Prior Anticoagulation Instructions: INR 2.7  Continue on same dosage 6mg  daily.  Recheck in 4 weeks.    Current Anticoagulation Instructions: INR 2.2  Continue same dose of 1 tablet every day.

## 2010-07-27 NOTE — Medication Information (Signed)
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 no    Recent/future dental: no  Any missed doses?: no       Is patient compliant with meds? yes       Allergies: 1)  Codeine Phosphate (Codeine Phosphate)  Anticoagulation Management History:      The patient is taking warfarin and comes in today for a routine follow up visit.  Positive risk factors for bleeding include an age of 70 years or older and history of CVA/TIA.  The bleeding index is 'intermediate risk'.  Positive CHADS2 values include History of HTN and Prior Stroke/CVA/TIA.  Negative CHADS2 values include Age > 1 years old.  The start date was 03/29/2002.  Anticoagulation responsible provider: Jens Som MD, Arlys John.  INR POC: 2.5.  Cuvette Lot#: [id number].  Exp: 04/2011.    Anticoagulation Management Assessment/Plan:      The patient's current anticoagulation dose is Coumadin 6 mg tabs: Take as directed by coumadin clinic.  The target INR is 2.0-3.0.  The next INR is due 03/31/2010.  Anticoagulation instructions were given to patient.  Results were reviewed/authorized by Eda Keys, PharmD.  He was notified by Eda Keys.         Prior Anticoagulation Instructions: INR 2.4  Continue on same dosage 6mg  daily.  Recheck in 4 weeks.    Current Anticoagulation Instructions: INR 2.5  Continue taking 1 tablet every day.  Return to clinic in 1 month.

## 2010-07-27 NOTE — Letter (Signed)
Summary: Custom - Lipid  [facility], Main Office  [address]   [place], [place] [address]   Phone: [phone]  Fax: [phone]     March 31, 2010 MRN: [id number]   Clinton [name] [address] [place], [place]  [address]   Dear [name],  We have reviewed your cholesterol results.  They are as follows:     Total Cholesterol:    145 (Desirable: less than 200)       HDL  Cholesterol:     44.40  (Desirable: greater than 40 for men and 50 for women)       LDL Cholesterol:       77  (Desirable: less than 100 for low risk and less than 70 for moderate to high risk)       Triglycerides:       118.0  (Desirable: less than 150)  Our recommendations include:These numbers look good. Continue on the same medicine. Liver function is normal. Take care, Dr. Darel Hong.    Call our office at the number listed above if you have any questions.  Lowering your LDL cholesterol is important, but it is only one of a large number of "risk factors" that may indicate that you are at risk for heart disease, stroke or other complications of hardening of the arteries.  Other risk factors include:   A.  Cigarette Smoking* B.  High Blood Pressure* C.  Obesity* D.   Low HDL Cholesterol (see yours above)* E.   Diabetes Mellitus (higher risk if your is uncontrolled) F.  Family history of premature heart disease G.  Previous history of stroke or cardiovascular disease    *These are risk factors YOU HAVE CONTROL OVER.  For more information, visit .  There is now evidence that lowering the TOTAL CHOLESTEROL AND LDL CHOLESTEROL can reduce the risk of heart disease.  The American Heart Association recommends the following guidelines for the treatment of elevated cholesterol:  1.  If there is now current heart disease and less than two risk factors, TOTAL CHOLESTEROL should be less than 200 and LDL CHOLESTEROL should be less than 100. 2.  If there is current heart disease  or two or more risk factors, TOTAL CHOLESTEROL should be less than 200 and LDL CHOLESTEROL should be less than 70.  A diet low in cholesterol, saturated fat, and calories is the cornerstone of treatment for elevated cholesterol.  Cessation of smoking and exercise are also important in the management of elevated cholesterol and preventing vascular disease.  Studies have shown that 30 to 60 minutes of physical activity most days can help lower blood pressure, lower cholesterol, and keep your weight at a healthy level.  Drug therapy is used when cholesterol levels do not respond to therapeutic lifestyle changes (smoking cessation, diet, and exercise) and remains unacceptably high.  If medication is started, it is important to have you levels checked periodically to evaluate the need for further treatment options.  Thank you,    [facility] Team

## 2010-07-27 NOTE — Assessment & Plan Note (Signed)
Summary: Cardiology Nuclear Testing  Nuclear Med Background Indications for Stress Test: Evaluation for Ischemia, Graft Patency, PTCA Patency   History: Angioplasty, CABG, Echo, Heart Catheterization, Myocardial Perfusion Study  History Comments: '85 CABG x 4; '03 PTCA-Cfx, RI; 1/10 MPS: lateral and antero-lateral ischemia, EF-35% 1/10 Echo 40%; h/o ICM    Symptoms: Palpitations, Rapid HR    Nuclear Pre-Procedure Cardiac Risk Factors: Carotid Disease, Family History - CAD, Hypertension, LBBB, Lipids, PVD, Smoker, TIA Caffeine/Decaff Intake: None NPO After: 7:00 PM Lungs: Clear.  O2 Sat 98% on RA. IV 0.9% NS with Angio Cath: 22g     IV Site: (R) AC IV Started by: Bonnita Levan, RN Chest Size (in) 44     Height (in): 71 Weight (lb): 177 BMI: 24.78 Tech Comments: Metoprolol held this a.m.  Nuclear Med Study 1 or 2 day study:  1 day     Stress Test Type:  Adenosine Reading MD:  Arvilla Meres, MD     Referring MD:  Olga Millers, MD Resting Radionuclide:  Technetium 44m Tetrofosmin     Resting Radionuclide Dose:  10.9 mCi  Stress Radionuclide:  Technetium 15m Tetrofosmin     Stress Radionuclide Dose:  33.0 mCi   Stress Protocol  Dose of Adenosine:  45.1 mg    Stress Test Technologist:  Rea [employer] CMA-N     Nuclear Technologist:  Harlow Asa CNMT  Rest Procedure  Myocardial perfusion imaging was performed at rest 45 minutes following the intravenous administration of Myoview Technetium 35m Tetrofosmin.  Stress Procedure  Baseline EKG had more marked T-wave changes; this was discussed with Dr. Excell Seltzer prior to infusion.  The patient received IV adenosine at 140 mcg/kg/min for 4 minutes. There was a brief episode of 2nd degree AVB  and occasional PVC's with infusion. Myoview was injected at the 2 minute mark and quantitative spect images were obtained after a 45 minute delay.  QPS Raw Data Images:  Normal; no motion artifact; LV dilated Stress Images:  Decreased  uptake in the lateral wall, basilar to mid inferior and anterior walls. Rest Images:  Decreased uptake in the lateral wall, basilar to mid inferior and anterior walls. Subtraction (SDS):  Previous infarct in the inferior and lateral walls. Very mild ischemia in the base of the anterior wall and mid lateral wall. Transient Ischemic Dilatation:  1.04  (Normal <1.22)  Lung/Heart Ratio:  .26  (Normal <0.45)  Quantitative Gated Spect Images QGS EDV:  210 ml QGS ESV:  150 ml QGS EF:  29 % QGS cine images:  LV is dilated. The inferior wall is akinetic, the septum is dyskinetic and the lateral wall is hypokinetic.  Findings Abnormal nuclear study Evidence for lateral ischemia  Evidence for inferior infarct  Evidence for LV Dysfunction LV Dysfunction    Overall Impression  Exercise Capacity: Adenosine study with no exercise. ECG Impression: LBBB Overall Impression: Abnormal stress nuclear study. Overall Impression Comments: Previous inferior and lateral wall infarct with minimal ischemia in the mid lateral wall and base of the anterior wall. The LV is dilated with EF 29%.  Appended Document: Cardiology Nuclear Testing schedule f/u ov; may need cath and then consideration of icd  Appended Document: Cardiology Nuclear Testing pt aware of results, follow up made

## 2010-07-27 NOTE — Medication Information (Signed)
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 no    Recent/future dental: no  Any missed doses?: no       Is patient compliant with meds? yes       Allergies: 1)  Codeine Phosphate (Codeine Phosphate)  Anticoagulation Management History:      The patient is taking warfarin and comes in today for a routine follow up visit.  Positive risk factors for bleeding include an age of 70 years or older and history of CVA/TIA.  The bleeding index is 'intermediate risk'.  Positive CHADS2 values include History of HTN and Prior Stroke/CVA/TIA.  Negative CHADS2 values include Age > 71 years old.  The start date was 03/29/2002.  Anticoagulation responsible Makynzie Dobesh: Bensimhon MD, Reuel Boom.  INR POC: 2.4.  Cuvette Lot#: [id number].  Exp: 03/2011.    Anticoagulation Management Assessment/Plan:      The patient's current anticoagulation dose is Coumadin 6 mg tabs: Take as directed by coumadin clinic.  The target INR is 2.0-3.0.  The next INR is due 03/04/2010.  Anticoagulation instructions were given to patient.  Results were reviewed/authorized by Cloyde Reams, RN, BSN.  He was notified by Cloyde Reams RN.         Prior Anticoagulation Instructions: INR 2.2  Continue on same dosage 6mg  daily.  Recheck in 4 weeks.    Current Anticoagulation Instructions: INR 2.4  Continue on same dosage 6mg  daily.  Recheck in 4 weeks.

## 2010-07-27 NOTE — Medication Information (Signed)
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 no    Recent/future dental: no  Any missed doses?: no       Is patient compliant with meds? yes       Current Medications (verified): 1)  Coumadin 6 Mg Tabs (Warfarin Sodium) .... Take As Directed By Coumadin Clinic 2)  Cozaar 100 Mg Tabs (Losartan Potassium) .Marland Kitchen.. 1 Tab By Mouth Once Daily 3)  Crestor 40 Mg Tabs (Rosuvastatin Calcium) .... Take 1 Tablet By Mouth Once A Day 4)  Hydrochlorothiazide 25 Mg Tabs (Hydrochlorothiazide) .Marland Kitchen.. 1 Tab By Mouth Once Daily 5)  Metoprolol Succinate 25 Mg Xr24h-Tab (Metoprolol Succinate) .... Tab By Mouth Once Daily 6)  Norvasc 10 Mg Tabs (Amlodipine Besylate) .... Tab By Mouth Once Daily 7)  Zetia 10 Mg Tabs (Ezetimibe) .... Take 1 Tablet By Mouth At Bedtime 8)  Clonidine Hcl 0.2 Mg Tabs (Clonidine Hcl) .... Take One Tablet By Mouth Twice A Day Pt. Is Not Takingmed. Last Dose About 1 Week Ago 9)  Aspir-Low 81 Mg Tbec (Aspirin) .Marland Kitchen.. 1 Once Daily 10)  Hydralazine Hcl 50 Mg Tabs (Hydralazine Hcl) .... Take One Tablet By Mouth Three Times A Day  Allergies (verified): 1)  Codeine Phosphate (Codeine Phosphate)  Anticoagulation Management History:      The patient is taking warfarin and comes in today for a routine follow up visit.  Positive risk factors for bleeding include an age of 70 years or older and history of CVA/TIA.  The bleeding index is 'intermediate risk'.  Positive CHADS2 values include History of HTN and Prior Stroke/CVA/TIA.  Negative CHADS2 values include Age > 71 years  old.  The start date was 03/29/2002.  Anticoagulation responsible provider: Brinnley Lacap, phillip.  INR POC: 3.2.  Cuvette Lot#: [id number].  Exp: 04/2011.    Anticoagulation Management Assessment/Plan:      The patient's current anticoagulation dose is Coumadin 6 mg tabs: Take as directed by coumadin clinic.  The target INR is 2.0-3.0.  The next INR is due 04/27/2010.  Anticoagulation instructions were given to patient.  Results were reviewed/authorized by Reina Fuse, PharmD .  He was notified by Reina Fuse PharmD.         Prior Anticoagulation Instructions: INR 2.5  Continue taking 1 tablet every day.  Return to clinic in 1 month.    Current Anticoagulation Instructions: INR 3.2  Tomorrow, Thursday, October 6th, take Coumadin 0.5 tab (3 mg).  Then, continue taking Coumadin 1 tab (6 mg) every day. Return to clinic in 4 weeks to coincide with MD appointment.

## 2010-07-27 NOTE — Letter (Signed)
Summary: Appointment - Cardiac MRI  [facility], Main Office  [address]   [place], [place] [address]   Phone: [phone]  Fax: [phone]      June 02, 2010 MRN: [id number]   Clinton [name] [address] [place], [place]  [address]   Dear [name],   We have scheduled the above patient for an appointment for a Cardiac MRI on 06-29-2010  at 9:00a.m.  Please refer to the below information for the location and instructions for this test:  Location:     [facility]       [address]       [place], [place]  [address] Instructions:    Wilmon Arms at [facility] Outpatient Registration 45 minutes prior to your appointment time.  This will ensure you are in the Radiology Department 30 minutes prior to your appointment.    There are no restrictions for this test you may eat and take medications as usual.  If you need to reschedule this appointment please call at the number listed above.  Sincerely,    Lorne Skeens  [facility] Scheduling Team

## 2010-07-27 NOTE — Assessment & Plan Note (Signed)
Summary: f8wks per check out/lg   History of Present Illness:  Clinton [name] is a pleasant gentleman who has a history of coronary  disease status post coronary bypassing graft, peripheral vascular  disease, cerebrovascular disease, hypertension, hyperlipidemia. His last myoview was   performed in July of 2011. This revealed previous inferior and lateral wall infarct with minimal ischemia in the mid lateral wall and base of the anterior wall. The LV is dilated with EF 29%. Echocardiogram in January, 2010 showed an ejection fraction of 40%, left atrial enlargement and mild mitral regurgitation. On his previous visit his blood pressure was elevated and we added hydralazine to his medical regimen. Since I last saw him in Sept 2011, the patient denies any dyspnea on exertion, orthopnea, PND, pedal edema, palpitations, syncope or chest pain.  Current Medications (verified): 1)  Coumadin 6 Mg Tabs (Warfarin Sodium) .... Take As Directed By Coumadin Clinic 2)  Cozaar 100 Mg Tabs (Losartan Potassium) .Marland Kitchen.. 1 Tab By Mouth Once Daily 3)  Crestor 40 Mg Tabs (Rosuvastatin Calcium) .... Take 1 Tablet By Mouth Once A Day 4)  Hydrochlorothiazide 25 Mg Tabs (Hydrochlorothiazide) .Marland Kitchen.. 1 Tab By Mouth Once Daily 5)  Metoprolol Succinate 25 Mg Xr24h-Tab (Metoprolol Succinate) .... Tab By Mouth Once Daily 6)  Norvasc 10 Mg Tabs (Amlodipine Besylate) .... Tab By Mouth Once Daily 7)  Zetia 10 Mg Tabs (Ezetimibe) .... Take 1 Tablet By Mouth At Bedtime 8)  Clonidine Hcl 0.2 Mg Tabs (Clonidine Hcl) .... Take One Tablet By Mouth Twice A Day Pt. Is Not Takingmed. Last Dose About 1 Week Ago 9)  Aspir-Low 81 Mg Tbec (Aspirin) .Marland Kitchen.. 1 Once Daily 10)  Hydralazine Hcl 50 Mg Tabs (Hydralazine Hcl) .... Take One Tablet By Mouth Three Times A Day  Allergies (verified): 1)  Codeine Phosphate (Codeine Phosphate)  Past History:  Past Medical History: Reviewed history from 03/02/2010 and no changes required. HYPERLIPIDEMIA-MIXED  (ICD-272.4) HYPERTENSION (ICD-401.9) CVA (ICD-434.91) PERIPHERAL VASCULAR DISEASE (ICD-443.9) followed by CVTS for bilateral carotid artery disease CARDIOMYOPATHY, ISCHEMIC (ICD-414.8) CAD, ARTERY BYPASS GRAFT (ICD-414.04)  Past Surgical History: Reviewed history from 03/02/2010 and no changes required. [id number] Left carotid endarterectomy in 1993 and right carotid endarterectomy 2004 Left carotid artery stent 05/2007 Aortobifemoral bypass grafting and right aortorenal bypass in 2004 Lumbar surgery x3  Social History: Reviewed history from 03/02/2010 and no changes required. Disabled  Divorced with 4 children Tobacco Use -yes Alcohol Use - no  Review of Systems       no fevers or chills, productive cough, hemoptysis, dysphasia, odynophagia, melena, hematochezia, dysuria, hematuria, rash, seizure activity, orthopnea, PND, pedal edema, claudication. Remaining systems are negative.   Vital Signs:  Patient profile:   70 year old male Weight:      184 pounds Pulse rate:   100 / minute Pulse rhythm:   regular BP sitting:   190 / 92  (left arm) Cuff size:   large  Vitals Entered By: Lisabeth Devoid RN (April 27, 2010 8:17 AM)  Physical Exam  General:  Well-developed well-nourished in no acute distress.  Skin is warm and dry.  HEENT is normal.  Neck is supple. No thyromegaly.  Chest is clear to auscultation with normal expansion.  Cardiovascular exam is regular rate and rhythm. 2/6 systolic murmur left sternal border. Abdominal exam nontender or distended. No masses palpated. Extremities show no edema. neuro grossly intact    EKG  Procedure date:  04/27/2010  Findings:      Normal sinus rhythm  at a rate of 91. Left bundle branch block.  Impression & Recommendations:  Problem # 1:  RENAL ARTERY STENOSIS (ICD-440.1)  Continue aspirin and statin. Repeat renal dopplers.  Orders: Renal Artery Duplex (Renal Artery Duplex)  Problem # 2:  COUMADIN THERAPY  (ICD-V58.61) Goal of INR 2-3.  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 Followed by vascular surgery. His updated medication list for this problem includes:    Coumadin 6 Mg Tabs (Warfarin sodium) .Marland Kitchen... Take as directed by coumadin clinic    Aspir-low 81 Mg Tbec (Aspirin) .Marland Kitchen... 1 once daily  Problem # 4:  TOBACCO ABUSE (ICD-305.1) Patient counseled on discontinuing.  Problem # 5:  HYPERLIPIDEMIA-MIXED (ICD-272.4)  Patient recently resumed his statin. Check lipids and liver. His updated medication list for this problem includes:    Crestor 40 Mg Tabs (Rosuvastatin calcium) .Marland Kitchen... Take 1 tablet by mouth once a day    Zetia 10 Mg Tabs (Ezetimibe) .Marland Kitchen... Take 1 tablet by mouth at bedtime  Orders: EKG w/ Interpretation (93000) TLB-Lipid Panel (80061-LIPID) TLB-Hepatic/Liver Function Pnl (80076-HEPATIC) TLB-BMP (Basic Metabolic Panel-BMET) (80048-METABOL)  Problem # 6:  HYPERTENSION (ICD-401.9) Blood pressure elevated. He did bring records from home and it is also mildly elevated there. Increase Toprol to 50 mg p.o. daily. He will monitor his blood pressure at home and return in 8 weeks with his monitor to make sure that correlates. His updated medication list for this problem includes:    Cozaar 100 Mg Tabs (Losartan potassium) .Marland Kitchen... 1 tab by mouth once daily    Hydrochlorothiazide 25 Mg Tabs (Hydrochlorothiazide) .Marland Kitchen... 1 tab by mouth once daily    Metoprolol Succinate 50 Mg Xr24h-tab (Metoprolol succinate) .Marland Kitchen... Take one tablet by mouth daily    Norvasc 10 Mg Tabs (Amlodipine besylate) .Marland Kitchen... Tab by mouth once daily    Clonidine Hcl 0.2 Mg Tabs (Clonidine hcl) .Marland Kitchen... Take one tablet by mouth twice a day pt. is not takingmed. last dose about 1 week ago    Aspir-low 81 Mg Tbec (Aspirin) .Marland Kitchen... 1 once daily    Hydralazine Hcl 50 Mg Tabs (Hydralazine hcl) .Marland Kitchen... Take one tablet by mouth three times a day  Problem # 7:  CARDIOMYOPATHY, ISCHEMIC  (ICD-414.8)  Blood pressure improved. Plan echocardiogram. If ejection fraction less than or equal to 35% I will refer for consideration of ICD. He would most likely require cardiac catheterization prior to implantation. His updated medication list for this problem includes:    Coumadin 6 Mg Tabs (Warfarin sodium) .Marland Kitchen... Take as directed by coumadin clinic    Cozaar 100 Mg Tabs (Losartan potassium) .Marland Kitchen... 1 tab by mouth once daily    Hydrochlorothiazide 25 Mg Tabs (Hydrochlorothiazide) .Marland Kitchen... 1 tab by mouth once daily    Metoprolol Succinate 50 Mg Xr24h-tab (Metoprolol succinate) .Marland Kitchen... Take one tablet by mouth daily    Norvasc 10 Mg Tabs (Amlodipine besylate) .Marland Kitchen... Tab by mouth once daily    Aspir-low 81 Mg Tbec (Aspirin) .Marland Kitchen... 1 once daily  Orders: Echocardiogram (Echo)  Problem # 8:  CAD, ARTERY BYPASS GRAFT (ICD-414.04)  Continue aspirin, beta blocker and statin. His updated medication list for this problem includes:    Coumadin 6 Mg Tabs (Warfarin sodium) .Marland Kitchen... Take as directed by coumadin clinic    Metoprolol Succinate 50 Mg Xr24h-tab (Metoprolol succinate) .Marland Kitchen... Take one tablet by mouth daily    Norvasc 10 Mg Tabs (Amlodipine besylate) .Marland Kitchen... Tab by mouth once daily    Aspir-low 81 Mg Tbec (  Aspirin) .Marland Kitchen... 1 once daily  His updated medication list for this problem includes:    Coumadin 6 Mg Tabs (Warfarin sodium) .Marland Kitchen... Take as directed by coumadin clinic    Metoprolol Succinate 25 Mg Xr24h-tab (Metoprolol succinate) .Marland Kitchen... Tab by mouth once daily    Norvasc 10 Mg Tabs (Amlodipine besylate) .Marland Kitchen... Tab by mouth once daily    Aspir-low 81 Mg Tbec (Aspirin) .Marland Kitchen... 1 once daily  Orders: EKG w/ Interpretation (93000)  Problem # 9:  PERIPHERAL VASCULAR DISEASE (ICD-443.9)  Patient Instructions: 1)  Your physician recommends that you schedule a follow-up appointment in: 8 week with Dr. Jens Som 2)  Your physician recommends that you have a FASTING lipid profile: 3)  today 4)  Your  physician has recommended you make the following change in your medication: Increase Metoprolol 5)  Your physician has requested that you have a renal artery duplex. During this test, an ultrasound is used to evaluate blood flow to the kidneys. Allow one hour for this exam. Do not eat after midnight the day before and avoid carbonated beverages. Take your medications as you usually do. 6)  Your physician has requested that you have an echocardiogram.  Echocardiography is a painless test that uses sound waves to create images of your heart. It provides your doctor with information about the size and shape of your heart and how well your heart's chambers and valves are working.  This procedure takes approximately one hour. There are no restrictions for this procedure. 7)  Your physician discussed the hazards of tobacco use.  Tobacco use cessation is recommended and techniques and options to help you quit were discussed. Prescriptions: METOPROLOL SUCCINATE 50 MG XR24H-TAB (METOPROLOL SUCCINATE) Take one tablet by mouth daily  #30 x 6   Entered by:   Lisabeth Devoid RN   Authorized by:   Ferman Hamming, MD, [facility]   Signed by:   Lisabeth Devoid RN on 04/27/2010   Method used:   Electronically to        [facility]. [phone]* (retail)       [phone]       [place], [place]  [address]       Ph: [phone]       Fax: [phone]   RxID:   [id number]

## 2010-07-27 NOTE — Medication Information (Signed)
Summary: rov/sp  Anticoagulant Therapy  Managed by: Weston Brass, PharmD Referring MD: Olga Millers MD Supervising MD: Jens Som MD, Arlys John Indication 1: Mitral Valve Regurgitation (ICD-Regurg) Indication 2: CVA-stroke (ICD-436) Lab Used: [facility] Site: [facility] INR POC 1.9 INR RANGE 2 - 3  Dietary changes: no    Health status changes: no    Bleeding/hemorrhagic complications: no    Recent/future hospitalizations: no    Any changes in medication regimen? no    Recent/future dental: no  Any missed doses?: no       Is patient compliant with meds? yes       Allergies: 1)  Codeine Phosphate (Codeine Phosphate)  Anticoagulation Management History:      The patient is taking warfarin and comes in today for a routine follow up visit.  Positive risk factors for bleeding include an age of 71 years or older and history of CVA/TIA.  The bleeding index is 'intermediate risk'.  Positive CHADS2 values include History of HTN and Prior Stroke/CVA/TIA.  Negative CHADS2 values include Age > 81 years old.  The start date was 03/29/2002.  Anticoagulation responsible provider: Jens Som MD, Arlys John.  INR POC: 1.9.  Cuvette Lot#: [id number].  Exp: 02/2011.    Anticoagulation Management Assessment/Plan:      The patient's current anticoagulation dose is Coumadin 6 mg tabs: Take as directed by coumadin clinic.  The target INR is 2.0-3.0.  The next INR is due 01/08/2010.  Anticoagulation instructions were given to patient.  Results were reviewed/authorized by Weston Brass, PharmD.  He was notified by Weston Brass PharmD.         Prior Anticoagulation Instructions: INR 2.2  Continue same dose of 1 tablet every day.    Current Anticoagulation Instructions: INR 1.9  Take an extra 1/2 tablet today then resume same dose of 1 tablet every day.

## 2010-07-27 NOTE — Medication Information (Signed)
Summary: [facility]  Anticoagulant Therapy  Managed by: Cloyde Reams, RN, BSN Referring MD: Olga Millers MD Supervising MD: Eden Emms MD, Theron Arista Indication 1: Mitral Valve Regurgitation (ICD-Regurg) Indication 2: CVA-stroke (ICD-436) Lab Used: [facility] Site: [facility] INR POC 2.6 INR RANGE 2 - 3  Dietary changes: no    Health status changes: no    Bleeding/hemorrhagic complications: no    Recent/future hospitalizations: no    Any changes in medication regimen? no    Recent/future dental: no  Any missed doses?: no       Is patient compliant with meds? yes       Allergies (verified): 1)  Codeine Phosphate (Codeine Phosphate)  Anticoagulation Management History:      The patient is taking warfarin and comes in today for a routine follow up visit.  Positive risk factors for bleeding include an age of 32 years or older and history of CVA/TIA.  The bleeding index is 'intermediate risk'.  Positive CHADS2 values include History of HTN and Prior Stroke/CVA/TIA.  Negative CHADS2 values include Age > 40 years old.  The start date was 03/29/2002.  Anticoagulation responsible provider: Eden Emms MD, Theron Arista.  INR POC: 2.6.  Cuvette Lot#: [id number].  Exp: 09/2010.    Anticoagulation Management Assessment/Plan:      The patient's current anticoagulation dose is Coumadin 6 mg tabs: Take as directed by coumadin clinic.  The target INR is 2.0-3.0.  The next INR is due 08/26/2009.  Anticoagulation instructions were given to patient.  Results were reviewed/authorized by Cloyde Reams, RN, BSN.  He was notified by Cloyde Reams RN.         Prior Anticoagulation Instructions: INR 2.5 Continue 6mg s everyday. Recheck in 4 weeks.   Current Anticoagulation Instructions: INR 2.6  Continue on same dosage 6mg  daily.  Recheck in 4 weeks.

## 2010-07-27 NOTE — Medication Information (Signed)
Summary: rov/ewj  Anticoagulant Therapy  Managed by: Cloyde Reams, RN, BSN Referring MD: Olga Millers MD Supervising MD: Eden Emms MD, Theron Arista Indication 1: Mitral Valve Regurgitation (ICD-Regurg) Indication 2: CVA-stroke (ICD-436) Lab Used: [facility] Site: [facility] INR POC 2.4 INR RANGE 2 - 3  Dietary changes: no    Health status changes: no    Bleeding/hemorrhagic complications: no    Recent/future hospitalizations: no    Any changes in medication regimen? no    Recent/future dental: no  Any missed doses?: no       Is patient compliant with meds? yes       Allergies (verified): 1)  Codeine Phosphate (Codeine Phosphate)  Anticoagulation Management History:      The patient is taking warfarin and comes in today for a routine follow up visit.  Positive risk factors for bleeding include an age of 71 years or older and history of CVA/TIA.  The bleeding index is 'intermediate risk'.  Positive CHADS2 values include History of HTN and Prior Stroke/CVA/TIA.  Negative CHADS2 values include Age > 6 years old.  The start date was 03/29/2002.  Anticoagulation responsible provider: Eden Emms MD, Theron Arista.  INR POC: 2.4.  Cuvette Lot#: [id number].  Exp: 10/2010.    Anticoagulation Management Assessment/Plan:      The patient's current anticoagulation dose is Coumadin 6 mg tabs: Take as directed by coumadin clinic.  The target INR is 2.0-3.0.  The next INR is due 09/23/2009.  Anticoagulation instructions were given to patient.  Results were reviewed/authorized by Cloyde Reams, RN, BSN.  He was notified by Cloyde Reams RN.         Prior Anticoagulation Instructions: INR 2.6  Continue on same dosage 6mg  daily.  Recheck in 4 weeks.    Current Anticoagulation Instructions: INR 2.4  Continue on same dosage 6mg  daily.  Recheck in 4 weeks.

## 2010-07-27 NOTE — Assessment & Plan Note (Signed)
Summary: F6M/DM   History of Present Illness:  Clinton [name] is a pleasant gentleman who has a history of coronary  disease status post coronary bypassing graft, peripheral vascular  disease, cerebrovascular disease, hypertension, hyperlipidemia. His last myoview was   performed on July 11, 2008.  His ejection fraction was 35%.  There was a small inferobasal wall infarct with significant ischemia in the lateral and anterolateral wall per Dr. Fabio Bering read.  I have reviewed   this and there is ischemia in the lateral wall.  Compared to the  previous Myoview performed on March 27, 2006, the ejection fraction is  reduced and ischemia may be slightly worse.  However, it is in the same   distribution. Echocardiogram in January, 2010 showed an ejection fraction of 40%, left atrial enlargement and mild mitral regurgitation. We have elected to treat him medically. Since I last saw him in November of 2010, the patient denies any dyspnea on exertion, orthopnea, PND, pedal edema, palpitations, syncope or chest pain.   Current Medications (verified): 1)  Coumadin 6 Mg Tabs (Warfarin Sodium) .... Take As Directed By Coumadin Clinic 2)  Cozaar 100 Mg Tabs (Losartan Potassium) .Marland Kitchen.. 1 Tab By Mouth Once Daily 3)  Crestor 40 Mg Tabs (Rosuvastatin Calcium) .... Take 1 Tablet By Mouth Once A Day 4)  Hydrochlorothiazide 25 Mg Tabs (Hydrochlorothiazide) .Marland Kitchen.. 1 Tab By Mouth Once Daily 5)  Metoprolol Succinate 25 Mg Xr24h-Tab (Metoprolol Succinate) .... Tab By Mouth Once Daily 6)  Norvasc 10 Mg Tabs (Amlodipine Besylate) .... Tab By Mouth Once Daily 7)  Zetia 10 Mg Tabs (Ezetimibe) .... Take 1 Tablet By Mouth At Bedtime 8)  Clonidine Hcl 0.2 Mg Tabs (Clonidine Hcl) .... Take One Tablet By Mouth Twice A Day 9)  Aspir-Low 81 Mg Tbec (Aspirin) .Marland Kitchen.. 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 [id number] Left carotid endarterectomy in 1993 and right carotid endarterectomy 2004 Left carotid artery stent 05/2007 Aortobifemoral bypass grafting and right aortorenal bypass in 2004 Lumbar surgery x3  Social History: Reviewed history from 06/30/2008 and no changes required. Disabled  Divorced with 4 children Tobacco Use -yes Alcohol Use - no  Review of Systems       no fevers or chills, productive cough, hemoptysis, dysphasia, odynophagia, melena, hematochezia, dysuria, hematuria, rash, seizure activity, orthopnea, PND, pedal edema, claudication. Remaining systems are negative.   Vital Signs:  Patient profile:   70 year old male Height:      71 inches Weight:      179 pounds BMI:     25.06 Pulse rate:   79 / minute Resp:     12 per minute BP sitting:   160 / 90  (left arm)  Vitals Entered By: Kem Parkinson (January 08, 2010 10:51 AM)  Physical Exam  General:  Well-developed well-nourished in no acute distress.  Skin is warm and dry.  HEENT is normal.  Neck is supple. No thyromegaly. bilateral bruits Chest is clear to auscultation with normal expansion.  Cardiovascular exam is regular rate and rhythm. 2/6 systolic ejection murmur Abdominal exam nontender or distended. No masses palpated. positive bruit Extremities show no edema. neuro grossly intact    EKG  Procedure date:  01/08/2010  Findings:      Sinus rhythm at a rate of 79. Left bundle branch block. First degree AV block.  Impression & Recommendations:  Problem # 1:  COUMADIN THERAPY (ICD-V58.61) Goal INR 2-3. Check CBC.  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 Followed by vascular surgery. His  updated medication list for this problem includes:    Coumadin 6 Mg Tabs (Warfarin sodium) .Marland Kitchen... Take as directed by coumadin clinic    Aspir-low 81 Mg Tbec (Aspirin) .Marland Kitchen... 1 once daily  His updated medication list for this problem includes:    Coumadin 6 Mg Tabs (Warfarin sodium) .Marland Kitchen... Take as directed by coumadin clinic    Aspir-low 81 Mg Tbec (Aspirin) .Marland Kitchen... 1 once daily  Problem # 3:  TOBACCO ABUSE (ICD-305.1) Patient counseled on discontinuing.  Problem # 4:  HYPERLIPIDEMIA-MIXED (ICD-272.4)  Continue present medications. Check lipids and liver. His updated medication list for this problem includes:    Crestor 40 Mg Tabs (Rosuvastatin calcium) .Marland Kitchen... Take 1 tablet by mouth once a day    Zetia 10 Mg Tabs (Ezetimibe) .Marland Kitchen... Take 1 tablet by mouth at bedtime  His updated medication list for this problem includes:    Crestor 40 Mg Tabs (Rosuvastatin calcium) .Marland Kitchen... Take 1 tablet by mouth once a day    Zetia 10 Mg Tabs (Ezetimibe) .Marland Kitchen... Take 1 tablet by mouth at bedtime  Problem # 5:  HYPERTENSION (ICD-401.9) Blood pressure elevated. Add hydralazine 25 mg p.o. t.i.d. His updated medication list for this problem includes:    Cozaar 100 Mg Tabs (Losartan potassium) .Marland Kitchen... 1 tab by mouth once daily    Hydrochlorothiazide 25 Mg Tabs (Hydrochlorothiazide) .Marland Kitchen... 1 tab by mouth once daily    Metoprolol Succinate 25 Mg Xr24h-tab (Metoprolol succinate) .Marland Kitchen... Tab by mouth once daily    Norvasc 10 Mg Tabs (Amlodipine besylate) .Marland Kitchen... Tab by mouth once daily    Clonidine Hcl 0.2 Mg Tabs (Clonidine hcl) .Marland Kitchen... Take one tablet by mouth twice a day    Aspir-low 81 Mg Tbec (Aspirin) .Marland Kitchen... 1 once daily    Hydralazine Hcl 25 Mg Tabs (Hydralazine hcl) .Marland Kitchen... 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 His updated medication list for this problem includes:    Coumadin  6 Mg Tabs (Warfarin sodium) .Marland Kitchen... Take as directed by coumadin clinic    Cozaar 100 Mg Tabs (Losartan potassium) .Marland Kitchen... 1 tab by mouth once daily    Hydrochlorothiazide 25 Mg Tabs (Hydrochlorothiazide) .Marland Kitchen... 1 tab by mouth once daily    Metoprolol Succinate 25 Mg Xr24h-tab (Metoprolol succinate) .Marland Kitchen... Tab by mouth once daily    Norvasc 10 Mg Tabs (Amlodipine besylate) .Marland Kitchen... Tab by mouth once daily    Aspir-low 81 Mg Tbec (Aspirin) .Marland Kitchen... 1 once daily  His updated medication list for this problem includes:    Coumadin 6 Mg Tabs (Warfarin sodium) .Marland Kitchen... Take as directed by coumadin clinic    Cozaar 100 Mg Tabs (Losartan potassium) .Marland Kitchen... 1 tab by mouth once daily    Hydrochlorothiazide 25 Mg Tabs (Hydrochlorothiazide) .Marland Kitchen... 1 tab by mouth once daily    Metoprolol Succinate 25 Mg Xr24h-tab (Metoprolol succinate) .Marland Kitchen... Tab by mouth once daily    Norvasc 10 Mg Tabs (Amlodipine besylate) .Marland Kitchen... Tab by mouth once daily    Aspir-low 81 Mg Tbec (Aspirin) .Marland Kitchen... 1 once daily  Problem # 8:  CAD, ARTERY BYPASS GRAFT (ICD-414.04) Continue aspirin, beta blocker, ARB and statin. Schedule Myoview for risk stratification. His updated medication list for this problem includes:    Coumadin 6 Mg Tabs (Warfarin sodium) .Marland Kitchen... Take as directed by coumadin clinic    Metoprolol Succinate 25 Mg Xr24h-tab (Metoprolol succinate) .Marland Kitchen... Tab by mouth once daily    Norvasc 10 Mg Tabs (Amlodipine besylate) .Marland Kitchen... Tab by mouth once daily    Aspir-low 81 Mg Tbec (Aspirin) .Marland Kitchen... 1 once daily  Orders: Nuclear Stress Test (Nuc Stress Test)  Problem # 9:  RENAL ARTERY STENOSIS (ICD-440.1) Continue aspirin and statin.  Patient Instructions: 1)  Your physician recommends that you schedule a follow-up appointment in: 6 MONTHS 2)  Your physician recommends that you return for lab work ZO:XWRU STRESS TEST-LIPID/LIVER/CBC/BMP-272.0/V58.69/401.1 3)  Your physician has recommended you make the following change in your medication: START  HYDRALAZINE 25MG  ONE TABLET THREE TIMES DAILY 4)  Your physician has requested that you have an exercise stress myoview.  For further information please visit [email or web address]  Please follow instruction sheet, as given. Prescriptions: HYDRALAZINE HCL 25 MG TABS (HYDRALAZINE HCL) Take one tablet by mouth three times a day  #90 x 12   Entered by:   Deliah Goody, RN   Authorized by:   Ferman Hamming, MD, [facility]   Signed by:   Deliah Goody, RN on 01/08/2010   Method used:   Electronically to        [facility]. [phone]* (retail)       [phone]       [place], [place]  [address]       Ph: [phone]       Fax: [phone]   RxID:   [id number]

## 2010-07-27 NOTE — Medication Information (Signed)
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 no    Recent/future dental: no  Any missed doses?: no       Is patient compliant with meds? yes       Allergies: 1)  Codeine Phosphate (Codeine Phosphate)  Anticoagulation Management History:      The patient is taking warfarin and comes in today for a routine follow up visit.  Positive risk factors for bleeding include an age of 21 years or older and history of CVA/TIA.  The bleeding index is 'intermediate risk'.  Positive CHADS2 values include History of HTN and Prior Stroke/CVA/TIA.  Negative CHADS2 values include Age > 83 years old.  The start date was 03/29/2002.  Anticoagulation responsible provider: Shirlee Latch MD, [place].  INR POC: 3.4.  Cuvette Lot#: [id number].  Exp: 05/2011.    Anticoagulation Management Assessment/Plan:      The patient's current anticoagulation dose is Coumadin 6 mg tabs: Take as directed by coumadin clinic.  The target INR is 2.0-3.0.  The next INR is due 06/22/2010.  Anticoagulation instructions were given to patient.  Results were reviewed/authorized by Weston Brass, PharmD.  He was notified by Weston Brass PharmD.         Prior Anticoagulation Instructions: INR 2.6  Continue same dose of 1 tablet every day.   Recheck INR in 4 weeks.   Current Anticoagulation Instructions: INR 3.4  Skip tomorrow's dose of Coumadin then resume same dose of 1 tablet every day. Recheck INR in 3 weeks.

## 2010-07-27 NOTE — Assessment & Plan Note (Signed)
Summary: rov/discuss myoview/dm   CC:  follow up myoview results.  History of Present Illness:  Mr. Baehr is a pleasant gentleman who has a history of coronary  disease status post coronary bypassing graft, peripheral vascular  disease, cerebrovascular disease, hypertension, hyperlipidemia. His last myoview was   performed in July of 2011. This revealed previous inferior and lateral wall infarct with minimal ischemia in the mid lateral wall and base of the anterior wall. The LV is dilated with EF 29%. Echocardiogram in January, 2010 showed an ejection fraction of 40%, left atrial enlargement and mild mitral regurgitation. Since I last saw him in November of 2010, the patient denies any dyspnea on exertion, orthopnea, PND, pedal edema, palpitations, syncope or chest pain.   Current Medications (verified): 1)  Coumadin 6 Mg Tabs (Warfarin Sodium) .... Take As Directed By Coumadin Clinic 2)  Cozaar 100 Mg Tabs (Losartan Potassium) .Marland Kitchen.. 1 Tab By Mouth Once Daily 3)  Crestor 40 Mg Tabs (Rosuvastatin Calcium) .... Take 1 Tablet By Mouth Once A Day 4)  Hydrochlorothiazide 25 Mg Tabs (Hydrochlorothiazide) .Marland Kitchen.. 1 Tab By Mouth Once Daily 5)  Metoprolol Succinate 25 Mg Xr24h-Tab (Metoprolol Succinate) .... Tab By Mouth Once Daily 6)  Norvasc 10 Mg Tabs (Amlodipine Besylate) .... Tab By Mouth Once Daily 7)  Zetia 10 Mg Tabs (Ezetimibe) .... Take 1 Tablet By Mouth At Bedtime 8)  Clonidine Hcl 0.2 Mg Tabs (Clonidine Hcl) .... Take One Tablet By Mouth Twice A Day Pt. Is Not Takingmed. Last Dose About 1 Week Ago 9)  Aspir-Low 81 Mg Tbec (Aspirin) .Marland Kitchen.. 1 Once Daily 10)  Hydralazine Hcl 25 Mg Tabs (Hydralazine Hcl) .... Take One Tablet By Mouth Three Times A Day  Allergies: 1)  Codeine Phosphate (Codeine Phosphate)  Past History:  Past Medical History: HYPERLIPIDEMIA-MIXED (ICD-272.4) HYPERTENSION (ICD-401.9) CVA (ICD-434.91) PERIPHERAL VASCULAR DISEASE (ICD-443.9) followed by CVTS for bilateral  carotid artery disease CARDIOMYOPATHY, ISCHEMIC (ICD-414.8) CAD, ARTERY BYPASS GRAFT (ICD-414.04)  Past Surgical History: Reviewed history from 03/02/2010 and no changes required. [id number] Left carotid endarterectomy in 1993 and right carotid endarterectomy 2004 Left carotid artery stent 05/2007 Aortobifemoral bypass grafting and right aortorenal bypass in 2004 Lumbar surgery x3  Social History: Reviewed history from 03/02/2010 and no changes required. Disabled  Divorced with 4 children Tobacco Use -yes Alcohol Use - no  Review of Systems       no fevers or chills, productive cough, hemoptysis, dysphasia, odynophagia, melena, hematochezia, dysuria, hematuria, rash, seizure activity, orthopnea, PND, pedal edema, claudication. Remaining systems are negative.   Vital Signs:  Patient profile:   70 year old male Height:      71 inches Weight:      181.25 pounds BMI:     25.37 Pulse rate:   100 / minute Pulse rhythm:   regular Resp:     18 per minute BP sitting:   180 / 100  (left arm) Cuff size:   large  Vitals Entered By: Vikki Ports (March 02, 2010 12:26 PM)  Physical Exam  General:  Well-developed well-nourished in no acute distress.  Skin is warm and dry.  HEENT is normal.  Neck is supple. No thyromegaly.  Chest is clear to auscultation with normal expansion.  Cardiovascular exam is regular rate and rhythm.  Abdominal exam nontender or distended. No masses palpated. Extremities show no edema. neuro grossly intact    Impression & Recommendations:  Problem # 1:  CARDIOMYOPATHY, ISCHEMIC (ICD-414.8)  On his most recent Myoview his LV  function has deteriorated. He is now 29%. I am concerned that there is a contribution from uncontrolled hypertension. He discontinued his clonidine as he did not realize he was supposed to continue this medication. I will increase his hydralazine to 50 mg p.o. t.i.d. and resume his clonidine. Once it is clear that his blood  pressure is controlled we will repeat his echocardiogram. If his ejection fraction is less than or equal to 35% we will refer to the EP for consideration of ICD. He would most likely require cardiac catheterization prior to that procedure. 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 I have asked the patient to follow his blood pressure at home and keep records. He will also bring his cuff at next visit to correlate with ours. His updated medication list for this problem includes:    Coumadin 6 Mg Tabs (Warfarin sodium) .Marland Kitchen... Take as directed by coumadin clinic    Cozaar 100 Mg Tabs (Losartan potassium) .Marland Kitchen... 1 tab by mouth once daily    Hydrochlorothiazide 25 Mg Tabs (Hydrochlorothiazide) .Marland Kitchen... 1 tab by mouth once daily    Metoprolol Succinate 25 Mg Xr24h-tab (Metoprolol succinate) .Marland Kitchen... Tab by mouth once daily    Norvasc 10 Mg Tabs (Amlodipine besylate) .Marland Kitchen... Tab by mouth once daily    Aspir-low 81 Mg Tbec (Aspirin) .Marland Kitchen... 1 once daily  His updated medication list for this problem includes:    Coumadin 6 Mg Tabs (Warfarin sodium) .Marland Kitchen... Take as directed by coumadin clinic    Cozaar 100 Mg Tabs (Losartan potassium) .Marland Kitchen... 1 tab by mouth once daily    Hydrochlorothiazide 25 Mg Tabs (Hydrochlorothiazide) .Marland Kitchen... 1 tab by mouth once daily    Metoprolol Succinate 25 Mg Xr24h-tab (Metoprolol succinate) .Marland Kitchen... Tab by mouth once daily    Norvasc 10 Mg Tabs (Amlodipine besylate) .Marland Kitchen... Tab by mouth once daily    Aspir-low 81 Mg Tbec (Aspirin) .Marland Kitchen... 1 once daily  Problem # 2:  COUMADIN THERAPY (ICD-V58.61) Goal INR 2-3.  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 Followed by vascular surgery. His updated medication list for this problem includes:    Coumadin 6 Mg Tabs (Warfarin sodium) .Marland Kitchen... Take as directed by coumadin clinic    Aspir-low 81 Mg Tbec (Aspirin) .Marland Kitchen... 1 once daily  His updated medication  list for this problem includes:    Coumadin 6 Mg Tabs (Warfarin sodium) .Marland Kitchen... Take as directed by coumadin clinic    Aspir-low 81 Mg Tbec (Aspirin) .Marland Kitchen... 1 once daily  Problem # 4:  TOBACCO ABUSE (ICD-305.1) Patient counseled on discontinuing.  Problem # 5:  HYPERLIPIDEMIA-MIXED (ICD-272.4)  Recent LDL significantly elevated. Crestor resumed. Check lipids and liver in 4 weeks. His updated medication list for this problem includes:    Crestor 40 Mg Tabs (Rosuvastatin calcium) .Marland Kitchen... Take 1 tablet by mouth once a day    Zetia 10 Mg Tabs (Ezetimibe) .Marland Kitchen... Take 1 tablet by mouth at bedtime  His updated medication list for this problem includes:    Crestor 40 Mg Tabs (Rosuvastatin calcium) .Marland Kitchen... Take 1 tablet by mouth once a day    Zetia 10 Mg Tabs (Ezetimibe) .Marland Kitchen... Take 1 tablet by mouth at bedtime  Problem # 6:  RENAL ARTERY STENOSIS (ICD-440.1) Continue aspirin and statin.  Problem # 7:  HYPERTENSION (ICD-401.9) Blood pressure elevated. Medication adjustment as described above. The following medications were removed from the medication list:  Hydralazine Hcl 25 Mg Tabs (Hydralazine hcl) .Marland Kitchen... Take one tablet by mouth three times a day His updated medication list for this problem includes:    Cozaar 100 Mg Tabs (Losartan potassium) .Marland Kitchen... 1 tab by mouth once daily    Hydrochlorothiazide 25 Mg Tabs (Hydrochlorothiazide) .Marland Kitchen... 1 tab by mouth once daily    Metoprolol Succinate 25 Mg Xr24h-tab (Metoprolol succinate) .Marland Kitchen... Tab by mouth once daily    Norvasc 10 Mg Tabs (Amlodipine besylate) .Marland Kitchen... Tab by mouth once daily    Clonidine Hcl 0.2 Mg Tabs (Clonidine hcl) .Marland Kitchen... Take one tablet by mouth twice a day pt. is not takingmed. last dose about 1 week ago    Aspir-low 81 Mg Tbec (Aspirin) .Marland Kitchen... 1 once daily    Hydralazine Hcl 50 Mg Tabs (Hydralazine hcl) .Marland Kitchen... Take one tablet by mouth three times a day  Problem # 8:  CAD, ARTERY BYPASS GRAFT (ICD-414.04) Only minimal ischemia on recent  Myoview. Continue medical therapy for now including aspirin, beta blocker and statin. His updated medication list for this problem includes:    Coumadin 6 Mg Tabs (Warfarin sodium) .Marland Kitchen... Take as directed by coumadin clinic    Metoprolol Succinate 25 Mg Xr24h-tab (Metoprolol succinate) .Marland Kitchen... Tab by mouth once daily    Norvasc 10 Mg Tabs (Amlodipine besylate) .Marland Kitchen... Tab by mouth once daily    Aspir-low 81 Mg Tbec (Aspirin) .Marland Kitchen... 1 once daily  Patient Instructions: 1)  Your physician recommends that you schedule a follow-up appointment in: 8 weeks. 2)  Your physician recommends that you return for a FASTING lipid profile: Fasting Lipids and Liver panel in 4 weeks. 3)  Your physician has recommended you make the following change in your medication: Re-start Clonidine 0.2 mg twice a day, start Hydralazine 50 mg take one table three time a day. Prescriptions: HYDRALAZINE HCL 50 MG TABS (HYDRALAZINE HCL) take one tablet by mouth three times a day  #90 x 6   Entered by:   Ollen Gross, RN, BSN   Authorized by:   Ferman Hamming, MD, [facility]   Signed by:   Ollen Gross, RN, BSN on 03/02/2010   Method used:   Print then Give to Patient   RxID:   [id number]

## 2010-07-27 NOTE — Medication Information (Signed)
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 no    Recent/future dental: no  Any missed doses?: no       Is patient compliant with meds? yes       Allergies: 1)  Codeine Phosphate (Codeine Phosphate)  Anticoagulation Management History:      The patient is taking warfarin and comes in today for a routine follow up visit.  Positive risk factors for bleeding include an age of 71 years or older and history of CVA/TIA.  The bleeding index is 'intermediate risk'.  Positive CHADS2 values include History of HTN and Prior Stroke/CVA/TIA.  Negative CHADS2 values include Age > 46 years old.  The start date was 03/29/2002.  Anticoagulation responsible provider: Eden Emms MD, Theron Arista.  INR POC: 2.7.  Cuvette Lot#: [id number].  Exp: 11/2010.    Anticoagulation Management Assessment/Plan:      The patient's current anticoagulation dose is Coumadin 6 mg tabs: Take as directed by coumadin clinic.  The target INR is 2.0-3.0.  The next INR is due 11/18/2009.  Anticoagulation instructions were given to patient.  Results were reviewed/authorized by Cloyde Reams, RN, BSN.  He was notified by Cloyde Reams RN.         Prior Anticoagulation Instructions: INR 2.4  Continue taking 1 tablet (6 mg) daily.  Return to clinic in 4 weeks.   Current Anticoagulation Instructions: INR 2.7  Continue on same dosage 6mg  daily.  Recheck in 4 weeks.

## 2010-07-29 NOTE — Assessment & Plan Note (Signed)
Summary: per check out/sf   Visit Type:  Follow-up  CC:  chest pain during MRI.  History of Present Illness:  Clinton [name] is a pleasant gentleman who has a history of coronary  disease status post coronary bypassing graft, peripheral vascular  disease, cerebrovascular disease, hypertension, hyperlipidemia. His last myoview was   performed in July of 2011. This revealed previous inferior and lateral wall infarct with minimal ischemia in the mid lateral wall and base of the anterior wall. The LV is dilated with EF 29%. Echocardiogram in November of 2011 showed an ejection fraction of 35-40% and moderate mitral regurgitation. His ejection fraction was felt to be borderline for ICD and MRI was recommended to more fully assess. This was performed in December of 2011 and revealed an ejection fraction of 30%. Renal Dopplers in November of 2011 showed patent aortobifemoral grafts and normal renal arteries. 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 I last saw him in November of 2011. Since then, the patient denies any dyspnea on exertion, orthopnea, PND, pedal edema, palpitations, syncope or chest pain.    Current Medications (verified): 1)  Coumadin 6 Mg Tabs (Warfarin Sodium) .... Take As Directed By Coumadin Clinic 2)  Cozaar 100 Mg Tabs (Losartan Potassium) .Marland Kitchen.. 1 Tab By Mouth Once Daily 3)  Crestor 40 Mg Tabs (Rosuvastatin Calcium) .... Take 1 Tablet By Mouth Once A Day 4)  Hydrochlorothiazide 25 Mg Tabs (Hydrochlorothiazide) .Marland Kitchen.. 1 Tab By Mouth Once Daily 5)  Metoprolol Succinate 50 Mg Xr24h-Tab (Metoprolol Succinate) .... Take One Tablet By Mouth Daily 6)  Norvasc 10 Mg Tabs (Amlodipine Besylate) .... Tab By Mouth Once Daily 7)  Zetia 10 Mg Tabs (Ezetimibe) .... Take 1 Tablet By Mouth At Bedtime 8)  Clonidine Hcl 0.2 Mg Tabs (Clonidine Hcl) .... Take One Tablet By Mouth Twice A Day 9)  Aspir-Low 81 Mg Tbec (Aspirin) .Marland Kitchen.. 1 Once Daily 10)  Hydralazine Hcl 50 Mg Tabs (Hydralazine Hcl) .... Take One  Tablet By Mouth Three Times A Day  Allergies (verified): 1)  Codeine Phosphate (Codeine Phosphate)  Past History:  Past Medical History: Reviewed history from 03/02/2010 and no changes required. HYPERLIPIDEMIA-MIXED (ICD-272.4) HYPERTENSION (ICD-401.9) CVA (ICD-434.91) PERIPHERAL VASCULAR DISEASE (ICD-443.9) followed by CVTS for bilateral carotid artery disease CARDIOMYOPATHY, ISCHEMIC (ICD-414.8) CAD, ARTERY BYPASS GRAFT (ICD-414.04)  Past Surgical History: Reviewed history from 03/02/2010 and no changes required. [id number] Left carotid endarterectomy in 1993 and right carotid endarterectomy 2004 Left carotid artery stent 05/2007 Aortobifemoral bypass grafting and right aortorenal bypass in 2004 Lumbar surgery x3  Social History: Reviewed history from 03/02/2010 and no changes required. Disabled  Divorced with 4 children Tobacco Use -yes Alcohol Use - no  Review of Systems       no fevers or chills, productive cough, hemoptysis, dysphasia, odynophagia, melena, hematochezia, dysuria, hematuria, rash, seizure activity, orthopnea, PND, pedal edema, claudication. Remaining systems are negative.   Vital Signs:  Patient profile:   70 year old male Height:      71 inches Weight:      184 pounds BMI:     25.76 Pulse rate:   80 / minute BP sitting:   192 / 76  (left arm) Cuff size:   large  Vitals Entered By: Caralee Ates CMA (July 06, 2010 10:51 AM)  Physical Exam  General:  Well-developed well-nourished in no acute distress.  Skin is warm and dry.  HEENT is normal.  Neck is supple. No thyromegaly.  Chest is clear to auscultation with normal expansion.  Cardiovascular  exam is regular rate and rhythm. 2/6 systolic murmur left sternal border. S4 Abdominal exam nontender or distended. No masses palpated. Extremities show no edema. neuro grossly intact    Impression & Recommendations:  Problem # 1:  RENAL ARTERY STENOSIS (ICD-440.1) Continue present medications  including aspirin and statin.  Problem # 2:  COUMADIN THERAPY (ICD-V58.61) Goal INR 2-3.  Problem # 3:  CAROTID ARTERY STENOSIS (ICD-433.10) Continue aspirin and statin. Carotids followed by vascular surgery. His updated medication list for this problem includes:    Coumadin 6 Mg Tabs (Warfarin sodium) .Marland Kitchen... Take as directed by coumadin clinic    Aspir-low 81 Mg Tbec (Aspirin) .Marland Kitchen... 1 once daily  Problem # 4:  TOBACCO ABUSE (ICD-305.1) Patient counseled on discontinuing.  Problem # 5:  HYPERLIPIDEMIA-MIXED (ICD-272.4)  Continue present medications. Check lipids and liver. His updated medication list for this problem includes:    Crestor 40 Mg Tabs (Rosuvastatin calcium) .Marland Kitchen... Take 1 tablet by mouth once a day    Zetia 10 Mg Tabs (Ezetimibe) .Marland Kitchen... Take 1 tablet by mouth at bedtime  Orders: TLB-Lipid Panel (80061-LIPID)  Problem # 6:  HYPERTENSION (ICD-401.9) Blood pressure elevated. Increase hydralazine to 100 mg p.o. t.i.d. His updated medication list for this problem includes:    Cozaar 100 Mg Tabs (Losartan potassium) .Marland Kitchen... 1 tab by mouth once daily    Hydrochlorothiazide 25 Mg Tabs (Hydrochlorothiazide) .Marland Kitchen... 1 tab by mouth once daily    Metoprolol Succinate 50 Mg Xr24h-tab (Metoprolol succinate) .Marland Kitchen... Take one tablet by mouth daily    Norvasc 10 Mg Tabs (Amlodipine besylate) .Marland Kitchen... Tab by mouth once daily    Clonidine Hcl 0.2 Mg Tabs (Clonidine hcl) .Marland Kitchen... Take one tablet by mouth twice a day    Aspir-low 81 Mg Tbec (Aspirin) .Marland Kitchen... 1 once daily    Hydralazine Hcl 100 Mg Tabs (Hydralazine hcl) .Marland Kitchen... 1 three times a day  Problem # 7:  CARDIOMYOPATHY, ISCHEMIC (ICD-414.8) Continue present medications. Ejection fraction 30% by MRI. Referred to the EP for consideration of ICD. His updated medication list for this problem includes:    Coumadin 6 Mg Tabs (Warfarin sodium) .Marland Kitchen... Take as directed by coumadin clinic    Cozaar 100 Mg Tabs (Losartan potassium) .Marland Kitchen... 1 tab by mouth once  daily    Hydrochlorothiazide 25 Mg Tabs (Hydrochlorothiazide) .Marland Kitchen... 1 tab by mouth once daily    Metoprolol Succinate 50 Mg Xr24h-tab (Metoprolol succinate) .Marland Kitchen... Take one tablet by mouth daily    Norvasc 10 Mg Tabs (Amlodipine besylate) .Marland Kitchen... Tab by mouth once daily    Aspir-low 81 Mg Tbec (Aspirin) .Marland Kitchen... 1 once daily  Problem # 8:  CAD, ARTERY BYPASS GRAFT (ICD-414.04) Continue aspirin, beta blocker, ARB and statin. His updated medication list for this problem includes:    Coumadin 6 Mg Tabs (Warfarin sodium) .Marland Kitchen... Take as directed by coumadin clinic    Metoprolol Succinate 50 Mg Xr24h-tab (Metoprolol succinate) .Marland Kitchen... Take one tablet by mouth daily    Norvasc 10 Mg Tabs (Amlodipine besylate) .Marland Kitchen... Tab by mouth once daily    Aspir-low 81 Mg Tbec (Aspirin) .Marland Kitchen... 1 once daily  Other Orders: TLB-Hepatic/Liver Function Pnl (80076-HEPATIC)  Patient Instructions: 1)  Your physician recommends that you schedule a follow-up appointment in: 6 MONTHS WITH DR Jens Som AND ALSO NEEDS APPT WITH DR Johney Frame RESCHEDULED 2)  Your physician recommends that you return for lab work ZO:XWRUE LIPID LIVER 272.4 3)  Your physician has recommended you make the following change in your medication: INCREASE HYDRALAZINE  TO 100 MG three times a day  Prescriptions: HYDRALAZINE HCL 100 MG TABS (HYDRALAZINE HCL) 1 three times a day  #90 x 11   Entered by:   Scherrie Bateman, LPN   Authorized by:   Ferman Hamming, MD, [facility]   Signed by:   Scherrie Bateman, LPN on 44/06/270   Method used:   Electronically to        [facility]. [phone]* (retail)       [phone]       [place], [place]  [address]       Ph: [phone]       Fax: [phone]   RxID:   [id number]

## 2010-07-29 NOTE — Medication Information (Signed)
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 no    Recent/future dental: no  Any missed doses?: no       Is patient compliant with meds? yes       Allergies: 1)  Codeine Phosphate (Codeine Phosphate)  Anticoagulation Management History:      The patient is taking warfarin and comes in today for a routine follow up visit.  Positive risk factors for bleeding include an age of 71 years or older and history of CVA/TIA.  The bleeding index is 'intermediate risk'.  Positive CHADS2 values include History of HTN and Prior Stroke/CVA/TIA.  Negative CHADS2 values include Age > 43 years old.  The start date was 03/29/2002.  Anticoagulation responsible provider: Bensimhon MD, Reuel Boom.  INR POC: 2.8.  Cuvette Lot#: [id number].  Exp: 05/2011.    Anticoagulation Management Assessment/Plan:      The patient's current anticoagulation dose is Coumadin 6 mg tabs: Take as directed by coumadin clinic.  The target INR is 2.0-3.0.  The next INR is due 08/03/2010.  Anticoagulation instructions were given to patient.  Results were reviewed/authorized by Weston Brass, PharmD.  He was notified by Stephannie Peters, PharmD Candidate .         Prior Anticoagulation Instructions: INR 3.4  Skip tomorrow's dose of Coumadin then resume same dose of 1 tablet every day. Recheck INR in 3 weeks.   Current Anticoagulation Instructions: INR 2.8  Coumadin 6 mg tablets - Continue 1 tablet every day

## 2010-07-30 ENCOUNTER — Encounter (INDEPENDENT_AMBULATORY_CARE_PROVIDER_SITE_OTHER): Payer: Medicare Other | Admitting: Vascular Surgery

## 2010-07-30 ENCOUNTER — Other Ambulatory Visit (INDEPENDENT_AMBULATORY_CARE_PROVIDER_SITE_OTHER): Payer: Medicare Other

## 2010-07-30 ENCOUNTER — Ambulatory Visit: Admit: 2010-07-30 | Payer: Self-pay | Admitting: Vascular Surgery

## 2010-07-30 DIAGNOSIS — I6529 Occlusion and stenosis of unspecified carotid artery: Secondary | ICD-10-CM

## 2010-07-30 DIAGNOSIS — Z48812 Encounter for surgical aftercare following surgery on the circulatory system: Secondary | ICD-10-CM

## 2010-08-05 ENCOUNTER — Encounter (INDEPENDENT_AMBULATORY_CARE_PROVIDER_SITE_OTHER): Payer: Medicare Other

## 2010-08-05 ENCOUNTER — Encounter: Payer: Self-pay | Admitting: Internal Medicine

## 2010-08-05 ENCOUNTER — Encounter (INDEPENDENT_AMBULATORY_CARE_PROVIDER_SITE_OTHER): Payer: Medicare Other | Admitting: Internal Medicine

## 2010-08-05 DIAGNOSIS — I5022 Chronic systolic (congestive) heart failure: Secondary | ICD-10-CM

## 2010-08-05 DIAGNOSIS — I10 Essential (primary) hypertension: Secondary | ICD-10-CM

## 2010-08-05 DIAGNOSIS — Z7901 Long term (current) use of anticoagulants: Secondary | ICD-10-CM

## 2010-08-05 DIAGNOSIS — I2589 Other forms of chronic ischemic heart disease: Secondary | ICD-10-CM

## 2010-08-05 DIAGNOSIS — I6789 Other cerebrovascular disease: Secondary | ICD-10-CM

## 2010-08-05 LAB — CONVERTED CEMR LAB: POC INR: 2.8

## 2010-08-06 NOTE — Procedures (Unsigned)
CAROTID DUPLEX EXAM  INDICATION:  Followup carotid artery disease.  HISTORY: Diabetes:  No Cardiac:  MI and CABG at 36 Hypertension:  Yes Smoking:  Yes Previous Surgery:  Left ICA and CCA stent, June 05, 2007, by Dr. Madilyn Fireman; left carotid endarterectomy, July 05, 1991; right carotid endarterectomy, December 26, 1992. CV History:  The patient is currently asymptomatic. Amaurosis Fugax No, Paresthesias No, Hemiparesis No                                      RIGHT             LEFT Brachial systolic pressure:         168               164 Brachial Doppler waveforms:         WNL               WNL Vertebral direction of flow:        Antegrade         Bidirectional DUPLEX VELOCITIES (cm/sec) CCA peak systolic                   145               162 ECA peak systolic                   116               400 ICA peak systolic                   149               203 ICA end diastolic                   46                41 PLAQUE MORPHOLOGY:                  Heterogeneous     Heterogeneous PLAQUE AMOUNT:                      Mild to moderate  Mild PLAQUE LOCATION:                    CCA, the bulb, the ICA, and the ECA ECA and the CCA  IMPRESSION:  A 40% to 59% stenosis within the right internal carotid artery.  This could be secondary to vessel tortuosity.  A 60% to 79% stenosis in the left internal carotid artery.  Intimal thickening within bilateral common carotid arteries.  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  The right vertebral is prominent.  A left external carotid artery stenosis.  ___________________________________________ Fransisco Hertz, MD  OD/MEDQ  D:  07/30/2010  T:  07/30/2010  Job:  [id number]

## 2010-08-06 NOTE — Assessment & Plan Note (Addendum)
OFFICE VISIT  Clinton [name], Clinton [name] DOB:  [birth year]                                       07/30/2010 ZOXWR#:[id number]  This is an established patient.  HISTORY OF PRESENT ILLNESS:  This is a 70 year old gentleman with history of multiple carotid interventions who presents for routine carotid protocol followup.  This gentleman previously has undergone bilateral carotid endarterectomy and left internal carotid and common carotid artery stents.  At this point he denies any symptoms of stroke or TIAs.  He notes specifically no episodes of amaurosis fugax.  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  Denies any episodes of any facial drooping or hemiplegia.  Also notes no episodes of expressive or receptive aphasia.  He has multiple atherosclerotic risk factors including hypertension, hyperlipidemia, smoking.  His management of those risk factors included aspirin, Coumadin use, Crestor, a number of antihypertensive.  He is in the process of trying to quit smoking.  PAST MEDICAL HISTORY:  Includes history of bilateral internal carotid artery stenosis, coronary artery disease, hypertension, aortoiliac occlusive disease, hyperlipidemia, right stroke with left-sided weakness initially, right eye blindness congenital, history of renal artery stenosis.  PAST SURGICAL HISTORY:  Includes a left internal carotid and common carotid artery stent placed in November of 2008.  A left carotid endarterectomy done in January of 1993, right carotid endarterectomy done in July of 1994.  He has undergone a CABG x5 vessels, lumbar surgery x3, history of some type of angioplasty he thinks in the iliac segments.  He has also undergone an aortobifemoral bypass and also a right aortorenal bypass.  SOCIAL HISTORY:  Currently smokes a few cigarettes a day.  Known previous smoking history 40 pack year history.  Denies any alcohol or illicit drug  use.  FAMILY HISTORY:  Mother died of coronary artery disease at 73 and father at 32 also of coronary artery disease.  MEDICATIONS:  Included Coumadin, Cozaar, Crestor, hydrochlorothiazide, metoprolol, Norvasc, Zetia, clonidine, aspirin, hydralazine.  ALLERGIES:  Codeine.  REVIEW OF SYSTEMS:  Today he noted shortness of breath when lying flat and frequency with urination.  Otherwise the rest of his review of systems was noted to be negative.  PHYSICAL EXAMINATION:  Vital signs:  On the right side of blood pressure 189/71, on the left side 183/73, a heart rate of 75, respirations were 12. General:  Well-developed, well-nourished, no apparent distress, alert and oriented x3. Head:  Normocephalic, atraumatic. ENT:  Hearing was grossly intact.  Oropharynx with erythema without any exudate.  Nares he had drainage without any erythema. Neck:  Supple without any nuchal rigidity. Eyes:  He had decreased direct response on the right side, however, indirect pupillary reactions were intact.  Extraocular movements were intact. Pulmonary:  Symmetric expansion, good air movement.  No rales, rhonchi or wheezing. Cardiac:  Regular rate and rhythm.  He had a systolic ejection murmur that was 2/6.  There were no gallops noted. Vascular:  He had palpable radials and brachials.  His carotids were palpable.  There were incisions over the neck consistent with carotid endarterectomies and there were bruits bilaterally.  I could not palpate his abdominal aorta easily.  There was a palpable femoral pulse and actually bilateral posterior tibial pulses with the left being stronger than the right.  DP I did not appreciate very well or popliteals. Abdomen:  Soft  abdomen, nontender, nondistended.  There is an incision consistent with the previous noted surgeries.  No guarding, no rebound, no masses noted.  Musculoskeletal:  He had 5/5 strength throughout.  No obvious ischemic changes in any extremity with  no signs of gangrene. Neurological:  Cranial nerves II-XII were intact except for the findings on the right eye with direct pupillary complications.  His motor was as listed above.  His sensation is grossly intact all extremities. Psychiatric:  Judgment was intact.  His mood and affect were appropriate for his situation. Skin:  No obvious rashes.  Extremities were as listed above. Lymphatic:  There was no cervical, axillary or inguinal lymphadenopathy.  NONINVASIVE VASCULAR IMAGING:  He had bilateral carotids done.  On the right side he had velocities up to 149 cm/sec.  This is somewhat increased from 114 c/s; this would put him in the 40% to 59% stenosis but this is not significantly different from previous.  On the left side the velocity went up to 203 c/s  from previously only 75 c/s.  However, there is a stent in this position too so it is hard to say exactly the degree of velocity as the current velocity criteria do not take into account the presence of a stent.  By pure velocity criteria this is a 60% to 70% with the stent widely open.  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  MEDICAL DECISION MAKING:  This is a 70 year old gentleman who presents with followup after multiple carotid interventions.  On his right side the change of velocity is relatively unimpressive and I do not really believe this is a significant change.  In regards to the left side it is hard to interpret the change in the velocity.  I would rather say that the stent is widely patent and that the usual velocity criteria in the presence of stent should not be utilized, as it does not apply to carotid stents.  However, even by velocity criteria it is less than 80% so no intervention is necessary.  He also has some evidence of some left subclavian pathology with bidirectional vertebral flow.  However, the patient is completely asymptomatic in regards to his  left arm so I would not actively pursue this.  Additionally he has a known history of both aortoiliac occlusive disease and renal occlusive disease that required an aortobifemoral and aortorenal bypasses.  He has not been undergoing surveillance for this so he needs to undergo a duplex of the aortobifemoral graft and aortorenal bypass.  That will be done in the next 2 to 4 weeks and then he will follow up with [place] to see if any further intervention.  In the future I would have all of these studies done together at a q.6 month interval.  I emphasized again to this patient the importance of smoking sensation and strict adherence to his medical regimen as evident by his blood pressure up in the 180's and the continued smoking he is not at maximal medical management at this point.  We appreciate being given the opportunity to participate in this patient's care.  We will continue to follow along and see him in another 2 to 4 weeks after the additional studies are completed.    Fransisco Hertz, MD Electronically Signed  BLC/MEDQ  D:  07/30/2010  T:  07/30/2010  Job:  [id number]

## 2010-08-12 NOTE — Medication Information (Signed)
Summary: Coumadin Clinic  Anticoagulant Therapy  Managed by: Eda Keys, PharmD Referring MD: Olga Millers, MD Supervising MD: Johney Frame MD, Fayrene Fearing Indication 1: Mitral Valve Regurgitation (ICD-Regurg) Indication 2: CVA-stroke (ICD-436) Lab Used: [facility] Site: [facility] INR POC 2.8 INR RANGE 2 - 3  Dietary changes: no    Health status changes: no    Bleeding/hemorrhagic complications: no    Recent/future hospitalizations: no    Any changes in medication regimen? no    Recent/future dental: no  Any missed doses?: no       Is patient compliant with meds? yes       Current Medications (verified): 1)  Coumadin 6 Mg Tabs (Warfarin Sodium) .... Take As Directed By Coumadin Clinic 2)  Cozaar 100 Mg Tabs (Losartan Potassium) .Marland Kitchen.. 1 Tab By Mouth Once Daily 3)  Crestor 40 Mg Tabs (Rosuvastatin Calcium) .... Take 1 Tablet By Mouth Once A Day 4)  Hydrochlorothiazide 25 Mg Tabs (Hydrochlorothiazide) .Marland Kitchen.. 1 Tab By Mouth Once Daily 5)  Metoprolol Succinate 50 Mg Xr24h-Tab (Metoprolol Succinate) .... Take One Tablet By Mouth Daily 6)  Norvasc 10 Mg Tabs (Amlodipine Besylate) .... Tab By Mouth Once Daily 7)  Zetia 10 Mg Tabs (Ezetimibe) .... Take 1 Tablet By Mouth At Bedtime 8)  Clonidine Hcl 0.2 Mg Tabs (Clonidine Hcl) .... Take One Tablet By Mouth Twice A Day 9)  Aspir-Low 81 Mg Tbec (Aspirin) .Marland Kitchen.. 1 Once Daily 10)  Hydralazine Hcl 100 Mg Tabs (Hydralazine Hcl) .Marland Kitchen.. 1 Three Times A Day  Allergies (verified): 1)  Codeine Phosphate (Codeine Phosphate)  Anticoagulation Management History:      The patient is taking warfarin and comes in today for a routine follow up visit.  Positive risk factors for bleeding include an age of 71 years or older and history of CVA/TIA.  The bleeding index is 'intermediate risk'.  Positive CHADS2 values include History of HTN and Prior Stroke/CVA/TIA.  Negative CHADS2 values include Age > 47 years old.  The start date was 03/29/2002.   Anticoagulation responsible provider: Latrica Clowers MD, Fayrene Fearing.  INR POC: 2.8.  Cuvette Lot#: [id number].  Exp: 06/2011.    Anticoagulation Management Assessment/Plan:      The patient's current anticoagulation dose is Coumadin 6 mg tabs: Take as directed by coumadin clinic.  The target INR is 2.0-3.0.  The next INR is due 09/02/2010.  Anticoagulation instructions were given to patient.  Results were reviewed/authorized by Eda Keys, PharmD.  He was notified by Eda Keys.         Prior Anticoagulation Instructions: INR 2.8  Coumadin 6 mg tablets - Continue 1 tablet every day   Current Anticoagulation Instructions: INR 2.8  Continue taking 1 tablet (6 mg) every day.  Return to clinic in 4 weeks.

## 2010-08-12 NOTE — Assessment & Plan Note (Signed)
Summary: ec6/discuss icd/dm/kwb   Visit Type:  Initial Consult Referring Provider:  Dr Clinton [name] Primary Provider:  Dr Clinton [name]   History of Present Illness: Clinton [name] is a pleasant 71 yo WM with a h/o CAD s/p CABG, ischemic CM (EF 30%), PVD, and NYHA Class II/III CHF who presents today for EP consultation regarding risk stratefication for sudden death.  He underwent CABG 1983-11-11 by Dr Andrey Campanile.  He has required multiple PCIs since that time.  He has also required  mutliple vascular procedures. He has been followed by Dr Clinton [name] for coronary disease and chronic systolic dysfunction.  His last myoview was performed in July of 2011. This revealed previous inferior and lateral wall infarct with minimal ischemia in the mid lateral wall and base of the anterior wall. The LV is dilated with EF 29%. Echocardiogram in November of 2011 showed an ejection fraction of 35-40% and moderate mitral regurgitation. His ejection fraction was felt to be borderline for ICD and MRI was recommended to more fully assess. This was performed in December of 2011 and revealed an ejection fraction of 30%. Renal Dopplers in November of 2011 showed patent aortobifemoral grafts and normal renal arteries. 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   Despite these chronic problems, he remains active.  He reports that he is able to mow his own lawn (riding and [employer]) and do yard work.  He walks without difficult on level ground but becomes quickly fatigued and SOB when on an incline.  He has 2 pillow orthopnea chronically.  He denies CP, edema, palpitations, presyncope, or syncope.  HE reports occasional orthostatic dizziness.  He is otherwise without complaint today.  Current Medications (verified): 1)  Coumadin 6 Mg Tabs (Warfarin Sodium) .... Take As Directed By Coumadin Clinic 2)  Cozaar 100 Mg Tabs (Losartan Potassium) .Marland Kitchen.. 1 Tab By Mouth Once Daily 3)  Crestor 40 Mg Tabs (Rosuvastatin Calcium) .... Take 1 Tablet By Mouth Once A  Day 4)  Hydrochlorothiazide 25 Mg Tabs (Hydrochlorothiazide) .Marland Kitchen.. 1 Tab By Mouth Once Daily 5)  Metoprolol Succinate 50 Mg Xr24h-Tab (Metoprolol Succinate) .... Take One Tablet By Mouth Daily 6)  Norvasc 10 Mg Tabs (Amlodipine Besylate) .... Tab By Mouth Once Daily 7)  Zetia 10 Mg Tabs (Ezetimibe) .... Take 1 Tablet By Mouth At Bedtime 8)  Clonidine Hcl 0.2 Mg Tabs (Clonidine Hcl) .... Take One Tablet By Mouth Twice A Day 9)  Aspir-Low 81 Mg Tbec (Aspirin) .Marland Kitchen.. 1 Once Daily 10)  Hydralazine Hcl 100 Mg Tabs (Hydralazine Hcl) .Marland Kitchen.. 1 Three Times A Day  Allergies: 1)  Codeine Phosphate (Codeine Phosphate)  Past History:  Past Medical History: HYPERLIPIDEMIA-MIXED (ICD-272.4) HYPERTENSION (ICD-401.9) CVA (ICD-434.91) PERIPHERAL VASCULAR DISEASE (ICD-443.9) followed by CVTS for bilateral carotid artery disease, prior renal artery stenosis, and femoral disease CARDIOMYOPATHY, ISCHEMIC (ICD-414.8) CAD, ARTERY BYPASS GRAFT (ICD-414.04)  Past Surgical History: Reviewed history from 03/02/2010 and no changes required. [id number] Left carotid endarterectomy in 11-Nov-1991 and right carotid endarterectomy 2002/11/11 Left carotid artery stent 05/2007 Aortobifemoral bypass grafting and right aortorenal bypass in Nov 11, 2002 Lumbar surgery x3  Family History: Reviewed history from 03/02/2010 and no changes required. Father:deceased-MI age 56 Mother:deceased-stroke Siblings:Brothers x2-both died fo CHF age 69 and age 33  Social History: Pt lives in [place] with his son.  Disabled  Divorced with 4 children Tobacco Use -yes, continues to smoke several cigarettes per day Alcohol Use - no  Review of Systems       All systems are reviewed and negative except  as listed in the HPI.   Vital Signs:  Patient profile:   70 year old male Height:      71 inches Weight:      180 pounds Pulse rate:   74 / minute BP sitting:   154 / 88  (left arm)  Vitals Entered By: Laurance Flatten CMA (August 05, 2010 11:22  AM)  Physical Exam  General:  elderly, NAD Head:  normocephalic and atraumatic Eyes:  PERRLA/EOM intact; conjunctiva and lids normal. Mouth:  Teeth, gums and palate normal. Oral mucosa normal. Neck:  supple + loud bilateral bruits Lungs:  Clear bilaterally to auscultation and percussion. Heart:  RRR, 2/6 early systolic murmur LUSB Abdomen:  Bowel sounds positive; abdomen soft and non-tender without masses, organomegaly, or hernias noted. No hepatosplenomegaly. Msk:  Back normal, normal gait. Muscle strength and tone normal. Extremities:  No clubbing or cyanosis. Neurologic:  Alert and oriented x 3. Skin:  Intact without lesions or rashes. Psych:  Normal affect.    Echocardiogram  Procedure date:  05/26/2010  Findings:      Study Conclusions    - Left ventricle: The cavity size was normal. Wall thickness was     increased in a pattern of mild LVH. Systolic function was     moderately reduced. The estimated ejection fraction was in the     range of 35% to 40%. Severe inferior and posterior hypokinesis.     The anterolateral wall was also hypokinetic. Features are     consistent with a pseudonormal left ventricular filling pattern,     with concomitant abnormal relaxation and increased filling     pressure (grade 2 diastolic dysfunction). E/medial e' > 15     suggests LV end diastolic pressure at least 20 mmHg.   - Aortic valve: Sclerosis without stenosis.   - Mitral valve: Mildly calcified annulus. Moderate regurgitation,     suspect ischemic Clinton from tethering of the posterior leaflet.   - Left atrium: The atrium was mildly to moderately dilated.   - Right ventricle: The cavity size was normal. Systolic function was     mildly reduced.   - Tricuspid valve: Peak RV-RA gradient: 32mm Hg (S).   - Pulmonary arteries: PA systolic pressure 43-47 mmHg.   - Systemic veins: IVC measured 2.4 cm with < 50% respirophasic     variation, suggesting RA pressurearound11-15 mmHg.    Impressions:    - Normal LV size with mild LV hypertrophy. EF 35-40% with inferior     and posterior severe hypokinesis and anterolateral hypokinesis.     There did appear to be some septal-lateral dyssynchrony. Moderate     diastolic dysfunction with evidence for elevated LV filling     pressure. Normal RV size with mild RV hypokinesis. Mild pulmonary     hypertension. Dilated IVC suggests elevated RV filling pressure.     Patient is borderline for ICD by EF criterion, consider cardiac     MRI to quantify.  Prepared and Electronically Authenticated by    Marca Ancona, MD   2011-11-30T13:12:01.153    EKG  Procedure date:  08/05/2010  Findings:      sinus rhythm 74 bpm, PR 216, QRS 182, Qtc 540, LBBB  MRI Chest  Procedure date:  06/29/2010  Findings:       Mild to moderately dilated LV with mild to moderate LV hypertrophy.   Moderate to severe systolic dysfunction, EF 30%.  The mid   anterolateral and posterior walls and  the apical lateral wall were   severely hypokinetic.  There was inferior moderate to severe   hypokinesis.  The left atrium was mildly dilated.  The right   ventricle was normal in size with mildly decreased systolic   function.  The right atrium was normal in size.  The aortic valve   was trileaflet, moderately calcified and mildly restricted. There   was at least mild mitral regurgitation (this was not quantified on   this study as flow sequences were not done).  Impression & Recommendations:  Problem # 1:  CARDIOMYOPATHY, ISCHEMIC (ICD-414.8) The patient has an ischemic CM (EF 30%), Chronic systolic dysfunction with NYHA Class II/III CHF, LBBB, and CAD.  He meets MADIT II/ SCD-HeFT criteria for ICD implantation for primary prevention of sudden death. Given LBBB, he may also benefit from resynchronization therapy.  Risks, benefits, alternatives to BiV ICD implantation were discussed in detail with the patient today.  At this point, the patient is clear  that he wishes to defer ICD implantation.  He states "I have been cut on too much lately".  He will continue to consider ICD implantation and will contact my office if he wishes to consider BiV ICD implant in the future.  He will follow-up with Dr Clinton [name] in the interim.  Problem # 2:  HYPERTENSION (ICD-401.9) continue medical therapy salt restriction  Problem # 3:  HYPERLIPIDEMIA-MIXED (ICD-272.4) stable His updated medication list for this problem includes:    Crestor 40 Mg Tabs (Rosuvastatin calcium) .Marland Kitchen... Take 1 tablet by mouth once a day    Zetia 10 Mg Tabs (Ezetimibe) .Marland Kitchen... Take 1 tablet by mouth at bedtime  Problem # 4:  TOBACCO ABUSE (ICD-305.1) cessaiton advised he states that Dr Clinton [name] has encouraged him to quit previously, but he remains unwilling to quit at this time.  Patient Instructions: 1)  Your physician recommends that you keep appointment with Dr. Jens [name] as scheduled. 2)  Your physician discussed the hazards of tobacco use.  Tobacco use cessation is recommended and techniques and options to help you quit were discussed. 3)  ICD information is included. 4)  Your physician recommends that you continue on your current medications as directed. Please refer to the Current Medication list given to you today.

## 2010-08-16 ENCOUNTER — Ambulatory Visit: Payer: Self-pay

## 2010-08-23 ENCOUNTER — Encounter: Payer: Self-pay | Admitting: Cardiology

## 2010-08-23 DIAGNOSIS — I6350 Cerebral infarction due to unspecified occlusion or stenosis of unspecified cerebral artery: Secondary | ICD-10-CM

## 2010-08-23 DIAGNOSIS — I340 Nonrheumatic mitral (valve) insufficiency: Secondary | ICD-10-CM | POA: Insufficient documentation

## 2010-08-27 ENCOUNTER — Ambulatory Visit (INDEPENDENT_AMBULATORY_CARE_PROVIDER_SITE_OTHER): Payer: Medicare Other | Admitting: Vascular Surgery

## 2010-08-27 ENCOUNTER — Other Ambulatory Visit (INDEPENDENT_AMBULATORY_CARE_PROVIDER_SITE_OTHER): Payer: Medicare Other

## 2010-08-27 ENCOUNTER — Encounter (INDEPENDENT_AMBULATORY_CARE_PROVIDER_SITE_OTHER): Payer: Medicare Other

## 2010-08-27 DIAGNOSIS — I7092 Chronic total occlusion of artery of the extremities: Secondary | ICD-10-CM

## 2010-08-27 DIAGNOSIS — I6529 Occlusion and stenosis of unspecified carotid artery: Secondary | ICD-10-CM

## 2010-08-27 DIAGNOSIS — Z48812 Encounter for surgical aftercare following surgery on the circulatory system: Secondary | ICD-10-CM

## 2010-08-27 DIAGNOSIS — I70219 Atherosclerosis of native arteries of extremities with intermittent claudication, unspecified extremity: Secondary | ICD-10-CM

## 2010-08-27 DIAGNOSIS — I701 Atherosclerosis of renal artery: Secondary | ICD-10-CM

## 2010-09-02 ENCOUNTER — Encounter (INDEPENDENT_AMBULATORY_CARE_PROVIDER_SITE_OTHER): Payer: Medicare Other

## 2010-09-02 ENCOUNTER — Encounter: Payer: Self-pay | Admitting: Cardiology

## 2010-09-02 DIAGNOSIS — I6789 Other cerebrovascular disease: Secondary | ICD-10-CM

## 2010-09-02 DIAGNOSIS — Z7901 Long term (current) use of anticoagulants: Secondary | ICD-10-CM

## 2010-09-02 LAB — CONVERTED CEMR LAB: POC INR: 2.9

## 2010-09-02 NOTE — Procedures (Unsigned)
BYPASS GRAFT EVALUATION  INDICATION:  Followup aortobifemoral and aortorenal bypass graft.  HISTORY: Diabetes:  No. Cardiac:  MI 54. Hypertension:  Yes. Smoking:  Yes. Previous Surgery:  Aortobifemoral and aortorenal bypass graft.  SINGLE LEVEL ARTERIAL EXAM                              RIGHT              LEFT Brachial:                    213                211 Anterior tibial:             193                178 Posterior tibial:            191                179 Peroneal: Ankle/brachial index:        0.91               0.84  PREVIOUS ABI:  Date:  RIGHT:  LEFT:  LOWER EXTREMITY BYPASS GRAFT DUPLEX EXAM:  DUPLEX:  Patent aortobifemoral bypass graft with an elevated velocity of the left distal anastomosis of 277 cm/s.  IMPRESSION: 1. Patent aortobifemoral bypass graft with an elevated velocity as     described above. 2. Ankle brachial indices suggestive of mild arterial disease     bilaterally.  ___________________________________________ Fransisco Hertz, MD  EM/MEDQ  D:  08/27/2010  T:  08/27/2010  Job:  [id number]

## 2010-09-02 NOTE — Assessment & Plan Note (Signed)
OFFICE VISIT  Clinton [name], Clinton [name] DOB:  October 09, 1940                                       08/27/2010 GEXBM#:[id number]  This is an established patient followup.  HISTORY OF PRESENT ILLNESS:  This is a 70 year old gentleman previously I have been seeing for routine surveillance.  At part of the routine surveillance I noticed that he had previously undergone aortobifemoral with right aortorenal bypass so he has not been previously scheduled for screening examination.  At this point the patient denies any abdominal or leg complaints.  His past medical, past surgical, social history, family history, review of systems, medications and allergies were completely unchanged from previous.  PHYSICAL EXAMINATION:  Today he had vital signs, blood pressure 201/78, heart rate of 77, respirations were 12, satting 97%.  On focused examination he has palpable bilateral femoral pulses, palpable dorsalis pedis pulses and no palpable aortic pulsation.  Incision is well-healed. No guarding, no rebound on abdominal examination.  NONINVASIVE VASCULAR IMAGING:  There were multiple studies completed. First was bilateral lower extremity ABIs.  Right side ABI was 0.91, on the left 0.84.  Biphasic waveforms in both lower extremities.  He also had aortobifemoral duplex that shows widely patent, at the left distal anastomosis there is an elevation up to 277 cm/sec.  He had a renal duplex completed which demonstrated R to R ratios on the right side 1.13 and the left 0.55, both suggestive of no significant renal artery stenosis.  Also, however, some portions of the bypass could not be visualized due to bowel gas.  Bilateral kidney lengths were within normal.  The resistive indices are slightly elevated, right 0.8 and left 0.79.  The celiac, SMA could not be visualized due to bowel gas and also some cystic structures were noted on both of the kidneys.  MEDICAL DECISION MAKING:  This is  a 70 year old gentleman who has previously undergone aortobifemoral with right aortorenal bypass.  He is also being seen for bilateral carotid stenosis.  At this point he is already plugged into surveillance for his carotids and also now will resume surveillance of his aorta and also his renal bypass.  Even though he has an elevated velocity in the left groin up to 277 c/s his ABIs are still consistent with widely patent flow with biphasic flow in the PT and dorsalis pedis.  I do not think there is necessarily any advantage at this point to pursuing any intervention as PTAs on aortobifemoral anastomoses are not technically easy due to the bifurcation of the aortobifemoral graft and inability to easily track into that anastomosis and subsequently would likely require open revision.  At this point the patient is completely asymptomatic and I do not see advantage of pursuing this at this point.  The patient agrees to just continue with surveillance for now.    Fransisco Hertz, MD Electronically Signed  BLC/MEDQ  D:  08/27/2010  T:  08/30/2010  Job:  [id number]

## 2010-09-02 NOTE — Procedures (Unsigned)
RENAL ARTERY DUPLEX EVALUATION  INDICATION:  Right renal artery bypass graft in 2004.  HISTORY: Diabetes:  No Cardiac:  No Hypertension:  Yes Smoking:  Yes  RENAL ARTERY DUPLEX FINDINGS: Aorta-Proximal:  48 cm/s Aorta-Mid:  78 cm/s Aorta-Distal:  77 cm/s Celiac Artery Origin:  Not visualized SMA Origin:  Not visualized.                                   RIGHT               LEFT Renal Artery Origin:             not visualized      not visualized Renal Artery Proximal:           not visualized      not visualized Renal Artery Mid:                not visualized      not visualized. Renal Artery Distal:             88 cm/s             43 cm/s Hilar Acceleration Time (AT): Renal-Aortic Ratio (RAR):        1.13                0.55 Kidney Size:                     11.5                11.1 End Diastolic Ratio (EDR): Resistive Index (RI):            0.80                0.79  IMPRESSION: 1. Patent right renal artery bypass graft and left renal artery noted     at their distal segments; however, the bilateral proximal to mid     segments were not adequately visualized. 2. Bilateral kidney length measurements are within normal limits with     abnormal bilateral resistive indices noted. 3. Incidental finding:  Multiple cystic structures noted near the     borders of the bilateral kidneys, two on the right and one on the     left, ranging from 0.9 cm to 2.6 cm. 4. Unable to adequately visualize the proximal celiac, proximal     superior mesenteric, and bilateral proximal to mid renal arteries     due to overlying bowel gas patterns.  ___________________________________________ Fransisco Hertz, MD  CH/MEDQ  D:  08/27/2010  T:  08/27/2010  Job:  [id number]

## 2010-09-06 LAB — CREATININE, SERUM: GFR calc non Af Amer: 51 mL/min — ABNORMAL LOW (ref >60)

## 2010-09-07 NOTE — Medication Information (Signed)
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 no    Recent/future dental: no  Any missed doses?: no       Is patient compliant with meds? yes       Allergies: 1)  Codeine Phosphate (Codeine Phosphate)  Anticoagulation Management History:      The patient is taking warfarin and comes in today for a routine follow up visit.  Positive risk factors for bleeding include an age of 71 years or older and history of CVA/TIA.  The bleeding index is 'intermediate risk'.  Positive CHADS2 values include History of HTN and Prior Stroke/CVA/TIA.  Negative CHADS2 values include Age > 65 years old.  The start date was 03/29/2002.  Anticoagulation responsible provider: Myrtis Ser MD, Tinnie Gens.  INR POC: 2.9.  Cuvette Lot#: [id number].  Exp: 06/2011.    Anticoagulation Management Assessment/Plan:      The patient's current anticoagulation dose is Coumadin 6 mg tabs: Take as directed by coumadin clinic.  The target INR is 2.0-3.0.  The next INR is due 09/30/2010.  Anticoagulation instructions were given to patient.  Results were reviewed/authorized by Weston Brass, PharmD.  He was notified by Weston Brass PharmD.         Prior Anticoagulation Instructions: INR 2.8  Continue taking 1 tablet (6 mg) every day.  Return to clinic in 4 weeks.   Current Anticoagulation Instructions: INR 2.9  Continue same dose of 1 tablet every day.  Recheck INR in 4 weeks.

## 2010-09-29 ENCOUNTER — Other Ambulatory Visit: Payer: Self-pay | Admitting: Cardiology

## 2010-09-30 ENCOUNTER — Ambulatory Visit (INDEPENDENT_AMBULATORY_CARE_PROVIDER_SITE_OTHER): Payer: Medicare Other | Admitting: *Deleted

## 2010-09-30 DIAGNOSIS — I340 Nonrheumatic mitral (valve) insufficiency: Secondary | ICD-10-CM

## 2010-09-30 DIAGNOSIS — Z7901 Long term (current) use of anticoagulants: Secondary | ICD-10-CM | POA: Insufficient documentation

## 2010-09-30 DIAGNOSIS — I6350 Cerebral infarction due to unspecified occlusion or stenosis of unspecified cerebral artery: Secondary | ICD-10-CM

## 2010-09-30 DIAGNOSIS — I059 Rheumatic mitral valve disease, unspecified: Secondary | ICD-10-CM

## 2010-09-30 LAB — POCT INR: INR: 3.1

## 2010-09-30 NOTE — Patient Instructions (Signed)
Skip tomorrow's dosage of Coumadin, then resume same dosage 6mg  daily. Recheck in 4 weeks.

## 2010-10-02 LAB — PROTIME-INR
INR: 1.1 (ref 0.00–1.49)
Prothrombin Time: 13.8 seconds (ref 11.6–15.2)

## 2010-10-03 LAB — BASIC METABOLIC PANEL
BUN: 19 mg/dL (ref 6–23)
CO2: 24 mEq/L (ref 19–32)
Calcium: 9 mg/dL (ref 8.4–10.5)
Creatinine, Ser: 1.4 mg/dL (ref 0.4–1.5)
GFR calc non Af Amer: 50 mL/min — ABNORMAL LOW (ref >60)
Glucose, Bld: 109 mg/dL — ABNORMAL HIGH (ref 70–99)
Sodium: 137 mEq/L (ref 135–145)

## 2010-10-03 LAB — CBC
Hemoglobin: 14.4 g/dL (ref 13.0–17.0)
MCHC: 33.8 g/dL (ref 30.0–36.0)
Platelets: 247 10*3/uL (ref 150–400)
RDW: 14.6 % (ref 11.5–15.5)

## 2010-10-06 LAB — CBC
HCT: 38.6 % — ABNORMAL LOW (ref 39.0–52.0)
MCHC: 34 g/dL (ref 30.0–36.0)
MCV: 88.7 fL (ref 78.0–100.0)
RBC: 4.35 MIL/uL (ref 4.22–5.81)
WBC: 7.8 10*3/uL (ref 4.0–10.5)

## 2010-10-06 LAB — BASIC METABOLIC PANEL
BUN: 20 mg/dL (ref 6–23)
CO2: 26 mEq/L (ref 19–32)
Chloride: 105 mEq/L (ref 96–112)
Potassium: 4 mEq/L (ref 3.5–5.1)

## 2010-10-06 LAB — PROTIME-INR: INR: 1.1 (ref 0.00–1.49)

## 2010-10-06 LAB — POCT I-STAT, CHEM 8
BUN: 23 mg/dL (ref 6–23)
Calcium, Ion: 1.04 mmol/L — ABNORMAL LOW (ref 1.12–1.32)
Chloride: 108 mEq/L (ref 96–112)
Glucose, Bld: 104 mg/dL — ABNORMAL HIGH (ref 70–99)
HCT: 42 % (ref 39.0–52.0)
Potassium: 4.1 mEq/L (ref 3.5–5.1)

## 2010-10-12 LAB — POCT I-STAT, CHEM 8
Calcium, Ion: 1.15 mmol/L (ref 1.12–1.32)
Chloride: 109 mEq/L (ref 96–112)
Creatinine, Ser: 1.3 mg/dL (ref 0.4–1.5)
Glucose, Bld: 99 mg/dL (ref 70–99)
Potassium: 4.3 mEq/L (ref 3.5–5.1)

## 2010-10-28 ENCOUNTER — Ambulatory Visit (INDEPENDENT_AMBULATORY_CARE_PROVIDER_SITE_OTHER): Payer: Medicare Other | Admitting: *Deleted

## 2010-10-28 DIAGNOSIS — I340 Nonrheumatic mitral (valve) insufficiency: Secondary | ICD-10-CM

## 2010-10-28 DIAGNOSIS — I059 Rheumatic mitral valve disease, unspecified: Secondary | ICD-10-CM

## 2010-10-28 DIAGNOSIS — I6350 Cerebral infarction due to unspecified occlusion or stenosis of unspecified cerebral artery: Secondary | ICD-10-CM

## 2010-11-09 NOTE — Op Note (Signed)
NAME:  Clinton [name], Clinton [name]             ACCOUNT NO.:  [id number]   MEDICAL RECORD NO.:  [id number]          PATIENT TYPE:  AMB   LOCATION:  SDS                          FACILITY:  [facility]   PHYSICIAN:  Gabrielle Dare. Janee Morn, M.D.DATE OF BIRTH:  04/23/1941   DATE OF PROCEDURE:  01/28/2009  DATE OF DISCHARGE:                               OPERATIVE REPORT   PREOPERATIVE DIAGNOSIS:  Right inguinal hernia.   POSTOPERATIVE DIAGNOSIS:  Right inguinal hernia.   PROCEDURE:  Repair of right inguinal hernia with mesh.   SURGEON:  Gabrielle Dare. Janee Morn, MD   ANESTHESIA:  General with laryngeal mask airway.   HISTORY OF PRESENT ILLNESS:  Mr. Dubin is a 70 year old white male who  presents for elective repair of right inguinal hernia with mesh.  He  underwent cardiac clearance and now presents for surgery.   PROCEDURE IN DETAIL:  Informed consent was obtained.  The patient was  identified in preop holding area.  His site was marked.  He received  intravenous antibiotics.  He was brought to the operating room.  General  anesthesia with laryngeal mask airway was administered by the anesthesia  staff.  His abdomen and groins were prepped and draped in sterile  fashion.  Marcaine 0.25% with epinephrine was injected out towards the  right anterior-superior iliac spine as well as down along the right  inguinal planned line of incision.  Right inguinal incision was made.  Subcutaneous tissues were dissected down through Scarpa fascia revealing  external oblique fascia.  This was somewhat attenuated medially due to  the hernia.  The external oblique was divided out laterally and the  division continued down sharply through the external ring.  The superior  leaflet of the external oblique fascia was dissected free of the  underlying transversalis.  The inferior leaflet was dissected down  revealing the shelving edge of the inguinal ligament.  There was a  visible inguinal branch of the ilioinguinal nerve.  This  was going to be  in danger of nerve entrapment, so it was divided and removed.  The cord  structures were then encircled with a Penrose drain.  Dissection  revealed the hernia to be a large direct hernia.  This was dissected  free off the cord structures.  There was no evidence of indirect hernia  sac.  The direct hernia easily reduced back into the abdomen in order to  prevent it from popping back up.  A figure-of-eight 2-0 Vicryl was  placed to keep it reduced.  The hernia repair was then completed with  polypropylene mesh cut into a keyhole shape and this was tacked to the  tissues over the pubic tubercle with 2-0 Prolene and then tacked in a  running fashion to the shelving edge of the inguinal ligament inferiorly  with running 2-0 Prolene suture.  Next, interrupted 2-0 Prolene sutures  were used to tack the mesh down to the tissues over the pubic tubercle  again and then extending out superiorly, tacking the mesh down to the  transversalis.  The 2 leaflets of the mesh were rejoined behind the cord  and tacked  together into the underlying musculature with interrupted 2-0  Prolene sutures.  The aperture in the mesh was dissected and it admitted  the tip of fifth digit.  Cord structures remained nonedematous and  viable.  The area was copiously irrigated.  Some additional local  anesthetic was injected.  Meticulous hemostasis was ensured.  External  oblique fascia was closed with a running 2-0 Vicryl suture.  Scarpa  fascia was closed with interrupted 3-0 Vicryl sutures and skin was  closed with running 4-0 Monocryl subcuticular stitch followed by  Dermabond.  Sponge, needle, and instrument counts were all  correct.  The patient tolerated procedure well without apparent  complications.  His right testicle was returned to anatomic position in  the scrotum at the completion of the procedure and he was taken to  recovery room in stable condition.      Gabrielle Dare Janee Morn, M.D.   Electronically Signed     BET/MEDQ  D:  01/28/2009  T:  01/28/2009  Job:  [id number]   cc:   Balinda Quails, M.D.  Madolyn Frieze Jens Som, MD, [facility]

## 2010-11-09 NOTE — Op Note (Signed)
NAME:  Clinton [name] NO.:  [id number]   MEDICAL RECORD NO.:  [id number]          PATIENT TYPE:  INP   LOCATION:  3316                         FACILITY:  [facility]   PHYSICIAN:  Balinda Quails, M.D.    DATE OF BIRTH:  1941-04-24   DATE OF PROCEDURE:  10/22/2008  DATE OF DISCHARGE:                               OPERATIVE REPORT   DIAGNOSIS:  Recurrent left common carotid stenosis.   PROCEDURE:  Left common carotid stent with distal embolic protection.   ACCESS:  Right common femoral artery 6-French sheath.   CONTRAST:  85 mL Visipaque.   COMPLICATIONS:  None apparent.   PERFORMING PHYSICIANS:  1. Balinda Quails, MD  2. Nanetta Batty, MD   CLINICAL NOTE:  Clinton [name] is a 70 year old gentleman who underwent  left carotid endarterectomy in 1993.  He had subsequent right carotid  endarterectomy in 2004.  He had a left carotid stent placed for  recurrent stenosis in December 2008.  Developed a further stenosis in  the left common carotid artery verified by duplex and arteriography.  Brought to the cath lab at this time for planned left carotid stent  under the choice trial.   PROCEDURE NOTE:  The patient was brought to the cath lab in stable  condition.  Placed in supine position.  Both groins were prepped and  draped in sterile fashion.  Skin, subcutaneous tissue, and right groins  were instilled with 1% Xylocaine.  An 18-gauge needle induced in right  common femoral artery.  A 0.035 Amplatz guidewire advanced through the  needle into the abdominal aorta.  Site opened with 11 blade.  A 6-French  shuttle sheath advanced over the guidewire and into the descending  thoracic aorta.  The Amplatz guidewire then removed.  A H1 catheter was  then advanced through the sheath and a 0.035 Versacore guidewire  advanced through the catheter.  The guidewire advanced into the aortic  arch and the catheter advanced in the aortic arch.  The left common  carotid artery was  accessed with the catheter.  This was verified by  contrast injection.  The Versacore wire was then removed, and the  Amplatz wire replaced through the catheter.  The shuttle sheath advanced  over the catheter in the left common carotid artery.   Left carotid arteriography obtained, this revealed the patient to be  status post left carotid stenting extending from the proximal left  internal carotid artery across the carotid bulb and in the left common  carotid artery.  There was evidence of recurrent stenosis and the  proximal left common carotid artery was 80%.  There was mild restenosis  at the origin of left internal carotid artery estimated to be less than  50%.   Intracranial views were obtained, dictated under separate heading by  Neurosurgery.   The patient administered bolus dose of Angiomax and continuous drip of  ACT 292 seconds.   An embolic protection device EmboShield 7.2 mm was then advanced through  the sheath and up in the left internal carotid artery and deployed.  There was good apposition verified by  a contrast injection.  The patient  remained neurologically stable.   Stent was then placed in the area of recurrent stenosis and left common  carotid artery, exact 10 x 30 stent deployed.  Post dilatation was then  carried out with an aviator 7 x 20 at 14 atmospheres x10 seconds.  No  hemodynamic change.   The retrieval catheter was then advanced over the guidewire and the  EmboShield device retrieved.  Completion arteriogram then completed with  intracranial views.  The patient remained neurologically stable.   This completed the procedure.  No apparent complications.  Post  dilatation residual stenosis estimated to be less than 20%.  The patient  transferred to the recovery room in stable condition.   FINAL IMPRESSION:  1. Recurrent left common carotid artery stenosis, status post      endarterectomy and previous carotid stenting.  2. Successful left common  carotid stent with distal embolic      protection, no apparent complications.      Balinda Quails, M.D.  Electronically Signed     PGH/MEDQ  D:  10/22/2008  T:  10/22/2008  Job:  [id number]   cc:   Pramod P. Pearlean Brownie, MD  Madolyn Frieze. Jens Som, MD, [facility]  Nanetta Batty, M.D.

## 2010-11-09 NOTE — Op Note (Signed)
NAMEMCCAULEY, DIEHL NO.:  [id number]   MEDICAL RECORD NO.:  [id number]          PATIENT TYPE:  INP   LOCATION:  2807                         FACILITY:  [facility]   PHYSICIAN:  Balinda Quails, M.D.    DATE OF BIRTH:  1940/06/30   DATE OF PROCEDURE:  06/15/2007  DATE OF DISCHARGE:                               OPERATIVE REPORT   SURGEON:  Balinda Quails, M.D.  Nanetta Batty, M.D.   DIAGNOSIS:  Severe, progressive, recurrent left internal carotid artery  stenosis.   PROCEDURE:  Left carotid stenting with distal embolic protection.   ACCESS:  Right common femoral artery 6 French sheath.   COMPLICATIONS:  None apparent.   CLINICAL NOTE:  Clinton [name] is a 70 year old male with severe,  advanced atherosclerotic vascular disease.  He has a history of coronary  artery bypass, aorto-bi-femoral bypass, and bilateral carotid  endarterectomies.  He developed severe recurrent left internal carotid  artery stenosis.  He was seen in consultation and brought to the cath  lab at this time for diagnostic workup and planned left carotid stent  with distal embolic protection.  Earlier, he underwent diagnostic  arteriography.  Now, planned left carotid stent.   OPERATIVE PROCEDURE:  Access was present through a 6 French right  femoral sheath.  A JB1 Select shuttle sheath system was used.  The  Wholey guide-wire was placed in the aortic arch.  The JB1 catheter  advanced into the left common carotid artery.  The shuttle sheath was  advanced over the JB1 catheter in the left common carotid artery.  With  the sheath in place, left carotid arteriography was obtained.  Planned  landing area for the embolic protection device was identified.  The  patient was administered a bolus dose of Angiomax followed by drip with  ACT 375 seconds.  A 7 mm extra support Angioguard embolic protection  device was then advanced through the sheath, across the left internal  carotid artery lesion, and  into the identified straight segment of the  distal internal carotid artery.  The embolic protection device was  deployed and opposed well to the distal internal carotid artery wall.  Pre-dilatation was then carried out with a 3 by 20 Maverick balloon at 8  atmospheres x6 seconds.  The balloon was then removed and over the wire  was passed an 8 by 40 Precise stent.  This was deployed in the left  internal carotid artery and out into the bulb of the left common carotid  artery.  A second stent, 9 by 30 Precise, was then overlapped in the  bulb and down into the left common carotid artery.  The area of maximal  stenosis was then post dilated with a 5.5 by 20 Aviator balloon at 10  atmospheres x11 seconds.  Final arteriograms were then obtained in the  cervical, AP, and lateral intracranial images.  There were no apparent  complications.  The patient remained stable throughout the procedure.  The embolic protection device was removed with a retrieval catheter.  Passing the catheter through the stented segment was difficult and  during passing  of the catheter, the embolic protection device was  dislodged distally into the siphon.  This, however, was easily  retrieved.  The patient remained neurologically intact.  The embolic  protection device removed.  Completion arteriography revealed no  evidence of injury to the siphon or distal internal carotid artery.  The  patient tolerated the procedure well.  The sheath was drawn back into  the right external iliac artery.  An oblique right retrograde femoral  arteriogram was obtained.  This revealed the right limb of the aorto-bi-  femoral graft to be widely patent with profunda run off.  The shuttle  sheath was then removed and a short 6 French sheath advanced over the  guide-wire, the dilator removed.  The sheath was flushed with heparin  saline solution.  Angiomax was discontinued.   FINAL IMPRESSION:  1. Severe, recurrent left internal carotid  artery stenosis estimated      to be greater than 80%.  2. Successful stenting of left internal carotid artery with overlap 8      by 40 and 9 by 30 Precise stents.      Balinda Quails, M.D.  Electronically Signed     PGH/MEDQ  D:  06/15/2007  T:  06/16/2007  Job:  [id number]   cc:   Nanetta Batty, M.D.

## 2010-11-09 NOTE — Assessment & Plan Note (Signed)
[facility]                            CARDIOLOGY OFFICE NOTE   Clinton [name], Clinton [name]                    MRN:          [id number]  DATE:01/17/2007                            DOB:          Feb 08, 1941    Mr. Mroczkowski is a very pleasant 70 year old gentleman who has a history  of coronary artery disease status post coronary artery bypass grafting,  peripheral vascular disease, cerebrovascular disease, hypertension, and  hyperlipidemia.  Since I last saw him, he is doing well from a  symptomatic standpoint.  He denies any chest pain,  shortness of breath,  syncope, or pedal edema.  Note: He does continue to smoke.  He is  exercising by walking, and he is also trying to follow a diet.   CURRENT MEDICATIONS:  1. Coumadin as directed, and he is followed in our Coumadin clinic.  2. Aspirin 81 mg p.o. daily.  3. Toprol 25 mg p.o. daily.  4. Cozaar 100 mg p.o. daily.  5. Norvasc  10 mg p.o. daily.  6. Zetia 10 mg p.o. daily.  7. Lipitor 80 mg p.o. daily.  8. Hydrochlorothiazide 12.5 mg p.o. daily.  9. Imdur 60 mg p.o. daily.   PHYSICAL EXAMINATION:  VITAL SIGNS:  Blood pressure 198/90, but he has  not taken his medicines.  His pulse is 96.  He weighs 193 pounds.  HEENT:  Normal.  NECK:  Supple with bilateral carotid bruits.  CHEST:  Clear.  CARDIOVASCULAR:  Regular rate and rhythm.  There is a 2/6 systolic  murmur in the left sternal border.  ABDOMEN: Exam shows no pulsatile masses, but there is some bruit.  EXTREMITIES:  Show no edema, and there is 2+ posterior tibial pulses.   His electrocardiogram shows a sinus rhythm with occasional PVCs.  He has  a left bundle branch block which is old.   DIAGNOSES:  1. Coronary artery disease status post coronary artery bypass      grafting.  The patient has had no chest pain or shortness of      breath, and his most recent Myoview in October 2007 was felt to be      low risk.  (He had an ejection fraction of  45% with an inferior      wall infarct; there was a question of mild ischemia in the      inferolateral wall.)  We will continue with medical therapy      including aspirin, a statin,  beta blocker, and ARB.  We      discussed the importance of discontinuance of tobacco use, and he      will continue on diet and exercise.  2. History of ischemic cardiomyopathy improved by most recent      echocardiogram.  3. Peripheral vascular disease.  The patient has not followed up with      CVTS.  I will schedule him to have carotid Dopplers to follow      cerebrovascular disease as well as abdominal ultrasound for his      history of renal bypass and aortofemoral bypass.  4. History of cerebrovascular accident.  5. Hypertension.  His blood pressure is elevated today, but he has not      taken his medications.  I have asked him to track this at home, and      we will adjust his regimen as indicated.  6. Hyperlipidemia.  We will check lipids and liver today and adjust      for a goal LDL of less than 70.  We will also check a BMET to      follow his potassium and renal function given his diuretic use.  7. Coumadin therapy.  We will check a CBC today, and he will continue      to be followed in our Coumadin clinic.  8. Tobacco abuse.  We again discussed the importance of discontinuing      this.   We will see him back in 9 months.     Madolyn Frieze Jens Som, MD, [facility]  Electronically Signed    BSC/MedQ  DD: 01/17/2007  DT: 01/17/2007  Job #: [id number]

## 2010-11-09 NOTE — Procedures (Signed)
CAROTID DUPLEX EXAM   INDICATION:  Followup evaluation of known carotid artery disease.   HISTORY:  Diabetes:  No.  Cardiac:  MI and coronary artery bypass graft in 1985.  Hypertension:  Medically controlled.  Smoking:  Less than 1 pack per day.  Previous Surgery:  Left internal and common carotid artery stent  06/15/2007 by Dr. Madilyn Fireman.  Left carotid endarterectomy 07/05/1991, right  carotid endarterectomy 12/26/1992, both performed by Dr. Madilyn Fireman.  The  patient had an aortobifemoral and an aortorenal bypass graft.  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  The  patient complains of 1 week of a dull headache over the left eye.  Amaurosis Fugax No, Paresthesias No, Hemiparesis No                                       RIGHT             LEFT  Brachial systolic pressure:         200               200  Brachial Doppler waveforms:         Triphasic         Triphasic  Vertebral direction of flow:        Antegrade         Atypical antegrade  DUPLEX VELOCITIES (cm/sec)  CCA peak systolic                   93                134  ECA peak systolic                   94                197  ICA peak systolic                   91                74  ICA end diastolic                   77                72  PLAQUE MORPHOLOGY:                  Calcified         Soft  PLAQUE AMOUNT:                      Mild              Mild  PLAQUE LOCATION:                    Proximal ICA      Proximal ICA   IMPRESSION:  1. 20-39% right ICA stenosis status post endarterectomy.  2. Atypical left vertebral artery Doppler waveform suggests left      distal obstruction or occlusion.  3. No significant restenosis within the stents in the left internal      and common carotid artery.   ___________________________________________  P. Liliane Bade, M.D.   MC/MEDQ  D:  11/30/2007  T:  11/30/2007  Job:  [id number]

## 2010-11-09 NOTE — Discharge Summary (Signed)
Clinton [name], Clinton [name]             ACCOUNT NO.:  [id number]   MEDICAL RECORD NO.:  [id number]          PATIENT TYPE:  INP   LOCATION:  2305                         FACILITY:  [facility]   PHYSICIAN:  Balinda Quails, M.D.    DATE OF BIRTH:  1941/04/03   DATE OF ADMISSION:  06/15/2007  DATE OF DISCHARGE:  06/16/2007                               DISCHARGE SUMMARY   ANTICIPATED DATE OF DISCHARGE:  June 16, 2007.   ADMISSION DIAGNOSIS:  Recurrent left internal carotid artery stenosis.   DISCHARGE/SECONDARY DIAGNOSES:  1. Recurrent left internal carotid artery stenosis, status post left      carotid stent.  2. Intolerant to codeine.  3. History of coronary artery disease, status post coronary artery      bypass graft in 1985.  4. Left external carotid cerebrovascular obstructive disease, status      post left carotid endarterectomy in 1993 and right carotid      endarterectomy in 2004.  5. History of a previous cerebrovascular accident.  6. Hypertension.  7. Dyslipidemia.  8. Chronic Coumadin therapy, which he reports has been on since his      coronary artery bypass graft (CABG).  9. A low risk Myoview study in October 2007.  Ejection fraction of 45%      with an inferior wall infarct, question of mild ischemic in the      inferolateral wall.  10.History of ischemic cardiomyopathy.  11.History of aortobifemoral bypass grafting and right aortorenal      bypass in 2004.   PROCEDURES:  Left carotid artery stent by Dr. Madilyn Fireman and Dr. Allyson Sabal on  June 15, 2007.   BRIEF HISTORY:  Clinton [name] is a 70 year old Caucasian male who is well  known to Dr. Madilyn Fireman.  He has had previous carotid and aortic surgery.  He  has had a previous stroke but has no significant deficits.  His last  carotid surgery was in July 2004, on the right.  He apparently was lost  to followup since that time but recently underwent carotid Doppler  evaluation at [facility] revealing blockages in his mid  left  internal carotid artery at 495/181-cm/sec, representing progressive  severe recurrent left internal carotid artery stenosis.  He has had no  neurologic symptoms.  Dr. Madilyn Fireman recommended that he undergo left carotid  cine for his recurrent disease and the patient was set up to see Dr.  Pearlean Brownie preoperatively prior to scheduling.   HOSPITAL COURSE:  Clinton [name] was electively admitted to [facility] on June 15, 2007.  He underwent the previously mentioned  procedure.  There were no known complication; however, the patient was  transferred to the surgical intensive care unit following the procedure  as there was no stepdown unit available.  He had an uneventful  postoperative course, although with mild bradycardia with a heart rate  in the 50s but was asymptomatic.  Heart rate was at 75 and sinus rhythm  at discharge.  Of note, baseline EKG showed a normal sinus rhythm with a  left bundle branch block with a rate in the 60s.  He also had some mild  hypertension and his home antihypertensive medication was resumed,  however, we also used labetalol and Apresoline p.r.n. orders as needed.  Oxygen saturation was 96% on room air.  He is afebrile.  Per Dr. Hazle Coca orders the patient did receive one set of cardiac  enzymes following the procedure.  This showed a normal CK and CK-MB of  196 and 3.8, respectively.  His relative index was normal at 1.9.  His  troponin level was minimally elevated at 0.08.  However, the patient  remained asymptomatic with no chest pain or significant fluctuations in  heart rate, rhythm, or blood pressure.  It was though this most likely  was just be the result from the procedure and not cardiac injury.  Other  labs showed a white count of 8.8, hemoglobin 11.7, hematocrit 34.6,  platelet count 242.  Sodium 136, potassium 3.9, BUN 21, creatinine 1.41,  and glucose of 98.  Of note his baseline creatinine was 1.11 and most  likely the mild elevation in his  creatinine was from dye during the  procedure.  Morning labs showed his INR 1.3.  He did get a half dose of  his Coumadin the night of the procedure just to ensure there was no  evidence of bleeding post procedure.  Following sheath removal, there was no evidence of right groin hematoma  and his legs remained well perfused.  On June 16, 2007, Clinton [name]  was felt appropriate for discharge.   PHYSICAL EXAMINATION:  GENERAL:  He was comfortable.  VITAL SIGNS:  Stable.  LUNGS:  Showed a few wheezes at the bases with no signs of respiratory  distress.  ABDOMEN:  Benign.  NEUROLOGIC:  He remained intact.   He is tolerating a regular diet and voiding without difficulty.  After  discussion with Dr. Madilyn Fireman the patient is supposed to remain on his  Coumadin and baby aspirin and with the addition of Plavix x6 weeks.   DISCHARGE MEDICATIONS:  1. Isosorbide 60 mg p.o. q.a.m.  2. Coumadin 6 mg 6 mg p.o. daily and as directed by [facility].  3. Toprol 25 mg daily.  4. Norvasc 10 mg daily.  5. Zetia 10 mg daily.  6. Hydralazine 25 mg two tablets in the morning and one in the      evening.  7. Lipitor 80 mg daily.  8. Cozaar 100 mg daily.  9. Hydrochlorothiazide 20 mg daily.  10.Aspirin 81 mg p.o. daily.  11.Plavix 75 mg daily x6 weeks.   DISCHARGE INSTRUCTIONS:  He is to:  1. Continue a heart healthy diet.  2. Increased activity slowly.  3. Shower.  4. Should avoid heavy lifting for the next few weeks and driving for      the next week.  5. Should call us if he develops redness or drainage or swelling from      his catheterization site or for neurologic changes.  6. He will see Dr. Madilyn Fireman at the [facility]  office in approximately 2 weeks      or I will just contact him regarding a      specific appointment date and time.  7. He was also instructed to contact [facility] to      schedule PT INR lab draw within 1 week to ensure he needs no      adjustment in  his Coumadin regimen.      Jerold Coombe, P.A.  Balinda Quails, M.D.  Electronically Signed    AWZ/MEDQ  D:  06/16/2007  T:  06/17/2007  Job:  [id number]   cc:   Balinda Quails, M.D.  Madolyn Frieze Jens Som, MD, [facility]  Nanetta Batty, M.D.  Pramod P. Pearlean Brownie, MD

## 2010-11-09 NOTE — Assessment & Plan Note (Signed)
[facility]                            CARDIOLOGY OFFICE NOTE   Clinton [name], [place] Clinton [name]                    MRN:          [id number]  DATE:12/12/2007                            DOB:          [birth year]    Clinton [name] is a pleasant 70 year old gentleman who has a history of  coronary artery disease, status post coronary artery bypass graft;  peripheral vascular disease; cerebrovascular disease; hypertension; and  hyperlipidemia.  Since I last saw him, he is doing well from the  symptomatic standpoint.  He denies any dyspnea, chest pain,  palpitations, or syncope.  There is no pedal edema.  Note, he is  continuing to smoke.  He also has not been taking his blood pressure  medicines as he has not been able to afford these.   CURRENT MEDICATIONS:  1. Coumadin as directed.  2. Aspirin 81 mg p.o. daily.  3. Toprol 25 mg p.o. daily.  4. Cozaar 100 mg p.o. daily - he is not taking this at present.  5. Norvasc 10 mg p.o. daily - he is also off this.  6. Zetia 10 mg p.o. daily.  7. Lipitor 80 mg p.o. daily.  8. Hydrochlorothiazide 12.5 mg p.o. daily - this one has been      discontinued.  9. Imdur 60 mg p.o. daily.  10.Niaspan 1 gram p.o. daily.   PHYSICAL EXAM:  Physical exam today shows a blood pressure of 208/88 and  his pulse is 68.  He weighs 192 pounds.  HEENT:  Normal.  NECK:  Supple with bilateral carotid bruits.  CHEST:  Clear.  CARDIOVASCULAR:  Regular rate and rhythm.  ABDOMEN:  Shows no tenderness.  SKIN:  Shows no edema.   His electrocardiogram shows a sinus rhythm with a left bundle branch  block.  There is also a first-degree AV block.   DIAGNOSES:  1. Coronary artery disease, status post coronary artery bypass graft -      Clinton [name] has had no chest pain, and his Myoview in October 2007      was felt to be at low risk.  We will continue with medical therapy      to include his aspirin, statin, beta-blocker, and I have asked him    to resume his angiotensin receptor blocker.  We again discussed the      importance of discontinuing his tobacco use.  He will continue with      diet and exercise.  2. History of ischemic cardiomyopathy - this has improved on his most      recent left ventricular function calculation.  3. Peripheral vascular disease - he is now being followed by [facility] for      his bilateral carotid disease.  We will plan an abdominal      ultrasound to follow up on his renal artery stenosis in October.  4. History of cerebrovascular accident.  5. Hypertension - his blood pressure is elevated today.  However, he      is not taking his Cozaar, Norvasc, or hydrochlorothiazide.  I have      asked him to resume  these, and we will check a BMET 1 week      afterwards.  6. Hyperlipidemia - when he returns for his above blood work, we will      have him check lipids and liver and adjust as indicated.  7. Coumadin therapy - we will check a CBC when he returns for his      blood work as well, and this is being also monitored in our      [facility] Clinic with goal INR of 2-3.  8. Tobacco abuse - we again discussed the importance of discontinuing      this for 3-10 minutes.   He will see [place] back in 6 months.     Madolyn Frieze Jens Som, MD, [facility]  Electronically Signed    BSC/MedQ  DD: 12/12/2007  DT: 12/13/2007  Job #: [id number]

## 2010-11-09 NOTE — Discharge Summary (Signed)
NAME:  Clinton [name], Clinton [name] NO.:  [id number]   MEDICAL RECORD NO.:  [id number]          PATIENT TYPE:  INP   LOCATION:  3316                         FACILITY:  [facility]   PHYSICIAN:  Balinda Quails, M.D.    DATE OF BIRTH:  [birth year]   DATE OF ADMISSION:  10/22/2008  DATE OF DISCHARGE:  10/23/2008                               DISCHARGE SUMMARY   ADMISSION DIAGNOSIS:  Recurrent left common carotid stenosis.   FINAL DISCHARGE DIAGNOSIS:  1. Recurrent left common carotid stenosis status post left carotid      artery stent.  2. History of initial left carotid artery stent in June 15, 2007      with previous left carotid endarterectomy in 1993 and right carotid      endarterectomy in 2004, all by Dr. Madilyn Fireman.  3. Coronary artery disease with history of coronary artery bypass      grafting in 1985.  4. Previous cerebrovascular accident.  5. Hypertension.  6. Dyslipidemia.  7. Chronic Coumadin therapy, which he reports have been since his      coronary artery bypass graft surgery.  8. History of ischemic cardiomyopathy.  9. History of aortobifemoral bypass grafting and aortorenal bypass in      2004.  10.Intolerance to CODEINE.  11.Normal sinus rhythm with first-degree atrioventricular block per      EKG.  12.History of right eye blindness since birth.   PROCEDURES:  October 22, 2008, left common carotid stent with distal  embolic protection by Dr. Balinda Quails.   BRIEF HISTORY:  Clinton [name] is a 70 year old Caucasian male who  underwent left carotid endarterectomy in 1993, and subsequent right  carotid endarterectomy in 2004.  He had a left carotid stent placed in  for recurrent stenosis in December 2008.  He developed further stenosis  on the left common carotid artery verified by duplex and arteriography  and Dr. Madilyn Fireman recommended left common carotid artery stent placement.   HOSPITAL COURSE:  Clinton [name] was electively admitted to [facility] on October 22, 2008.  He underwent the previously mentioned  procedure.  Post procedure, he was taken to the step-down unit 3300,  where it is anticipated he will remain until discharge.  At the time of  this dictation, he has had uneventful postprocedure course.  He has been  hemodynamically stable.  Heart rate is maintained in the mid 50s to mid  60s range in sinus rhythm, first-degree AV block, which is no  significant change from his baseline.   His labs showed a white count of 7.8, hemoglobin 13.1, hematocrit 38.6,  platelet count of 192.  Sodium 137, potassium 4.0, BUN 20, creatinine  1.29, and blood glucose of 100.  An INR on October 22, 2008, was 1.1.  On  that morning, he was ambulating and voiding without difficulty.  Lungs  were clear.  Abdomen was soft and right groin showed no evidence of  hematoma.  He had palpable posterior tibial pulses bilaterally.  Neurologically, he was intact at his baseline.  Since that morning, diet  was advanced and it is  anticipated that if he tolerates breakfast, we  will plan him to be discharged home on October 23, 2008.  Currently, he  remains in stable and improving condition.   DISCHARGE MEDICATIONS:  1. Cozaar 100 mg p.o. daily.  2. Coumadin 6 mg p.o. daily.  3. Crestor 40 mg p.o. daily.  4. Toprol 25 mg p.o. daily.  5. Norvasc 10 mg p.o. daily.  6. Zetia 10 mg p.o. daily.  7. Hydralazine 25 mg p.o. daily.  8. Hydrochlorothiazide 25 mg p.o. daily.  9. Clonidine 0.1 mg p.o. b.i.d.  10.Plavix 75 mg p.o. daily x6 weeks.  11.Aspirin 81 mg p.o. daily.   DISCHARGE INSTRUCTIONS:  Continue heart-healthy diet.  May shower and  clean the incisions gently with soap and water.  Avoid driving or heavy  lifting for the next 2 weeks.  See Dr. Madilyn Fireman in approximately 2 weeks.  He is to call sooner if he has fever greater than 101, neurologic  changes, or swelling or pain in the right groin or circulation changes  in the leg.      Clinton [name],  P.A.      Balinda Quails, M.D.  Electronically Signed    AWZ/MEDQ  D:  10/23/2008  T:  10/23/2008  Job:  [id number]   cc:   Madolyn Frieze. Jens Som, MD, [facility]  Nanetta Batty, M.D.

## 2010-11-09 NOTE — Assessment & Plan Note (Signed)
[facility]                            CARDIOLOGY OFFICE NOTE   Messi, Twedt OZIL STETTLER                    MRN:          [id number]  DATE:07/03/2008                            DOB:          08/21/1940    [name] is a pleasant gentleman who has a history of coronary  disease status post coronary bypassing graft, peripheral vascular  disease, cerebrovascular disease, hypertension, hyperlipidemia.  I last  saw him on December 12, 2007.  Since then, he has mild dyspnea on exertion,  but there is no orthopnea, PND, pedal edema, palpitations, presyncope,  syncope, or exertional chest pain.  He does continue to smoke.   His medications include:  1. Coumadin as directed.  2. Aspirin 81 mg p.o. daily.  3. Toprol 25 mg p.o. daily.  4. Cozaar 100 mg p.o. daily.  5. Norvasc 10 mg p.o. daily.  6. Zetia 10 mg p.o. daily.  7. Lipitor 80 mg p.o. daily.  8. Hydrochlorothiazide 12.5 mg p.o. daily.  9. Imdur 60 mg p.o. daily.  10.Niaspan 1 g p.o. daily.   His physical exam today shows a blood pressure of 170/80, but he has not  taken his medications as of yet.  His pulse is 89.  His HEENT is normal.  His neck is supple.  Chest is clear.  Cardiovascular exam reveals a  regular rate and rhythm.  There is a 2/6 systolic murmur at the left  sternal border.  Abdominal exam shows no tenderness.  His extremities  show no edema.   Electrocardiogram shows a sinus rhythm with a first-degree AV block and  left bundle-branch block.   DIAGNOSES:  1. Coronary artery disease status post coronary bypass graft - Mr.      Shiffler will continue with his aspirin, statin, beta-blocker, and      ARB.  I have again discussed the importance of diet and exercise      and discontinue his tobacco use.  He will need a followup Myoview      and we will arrange this.  2. Ischemic cardiomyopathy - this is improved by most recent      echocardiogram.  3. Peripheral vascular disease - he  will continue on his aspirin and      statin.  [facility] is now following his carotids.  We will plan to      repeat his abdominal ultrasound for his renal artery stenosis.  4. History of cerebrovascular accident.  5. Hypertension - his blood pressure is elevated, but he has not taken      his medications as yet.  I will check a BMET and follow his      potassium and renal function.  6. Hyperlipidemia - he will continue on his statin, Zetia, and      Niaspan.  We will check lipids and liver.  7. Coumadin therapy - we will check a CBC as well, and he will      continue to monitored in our [facility].  8. Tobacco abuse - we discussed the importance of discontinuing this      for between  3-10 minutes.   I will see him back in 6 months.     Madolyn Frieze Jens Som, MD, [facility]  Electronically Signed    BSC/MedQ  DD: 07/03/2008  DT: 07/04/2008  Job #: [id number]

## 2010-11-09 NOTE — Assessment & Plan Note (Signed)
OFFICE VISIT   Clinton [name], Clinton [name]  DOB:  1940-12-04                                       07/12/2007  VHQIO#:[id number]   The patient underwent left carotid stenting with distal embolic  protection 06/15/2007 for a severe recurrent left internal carotid  artery stenosis.  This was an uneventful procedure.  He was discharged  home in good condition.   Since discharge, he has no complaints.  Denies sensory, motor, or visual  deficit.   He has had no recent chest pain or shortness of breath.  His activity  level is reasonable.   On evaluation, he is alert and oriented.  In no acute distress.  BP  148/62, pulse 76 per minute, respirations 18 per minute, O2 saturation  99%.  His left neck does reveal a soft bruit.  No bruit in the right  neck.  Cranial nerves intact.  Strength equal bilaterally.  Normal gait.  Reflexes are 1+ bilaterally.   The patient remains on Coumadin and aspirin at this time.  He will  continue these.  He will return in 6 months for a carotid Doppler  surveillance study.   Balinda Quails, M.D.  Electronically Signed   PGH/MEDQ  D:  07/12/2007  T:  07/13/2007  Job:  [id number]   cc:   Madolyn Frieze. Jens Som, MD, [facility]  Pramod P. Pearlean Brownie, MD

## 2010-11-09 NOTE — Op Note (Signed)
NAME:  Clinton [name], Clinton [name]             ACCOUNT NO.:  [id number]   MEDICAL RECORD NO.:  [id number]          PATIENT TYPE:  AMB   LOCATION:  SDS                          FACILITY:  [facility]   PHYSICIAN:  Balinda Quails, M.D.    DATE OF BIRTH:  [birth year]   DATE OF PROCEDURE:  08/06/2008  DATE OF DISCHARGE:                               OPERATIVE REPORT   PHYSICIAN:  Balinda Quails, MD   DIAGNOSIS:  Recurrent left carotid stenosis.   PROCEDURES:  1. Arch aortogram.  2. Bilateral selective carotid arteriograms.   ACCESS:  Right common femoral artery 5-French sheath.   CONTRAST:  Visipaque 125 mL.   COMPLICATIONS:  None apparent.   HISTORY:  Clinton [name] is a 70 year old gentleman with a history  of left carotid endarterectomy carried out in 1993, right carotid  endarterectomy carried out in 2004.  Recurrent left carotid stenosis  treated with stenting in December 2008.  He has remained asymptomatic.   Followup duplex scan revealed evidence of progression of left common  carotid artery stenosis, in-stent restenosis with velocity of 318/74  cm/sec.   The patient brought to the Cath Lab at this time for diagnostic cerebral  arteriography.   PROCEDURE NOTE:  The patient was brought to the operating room in stable  condition.  Placed in supine position.  Both groins were prepped and  draped in a sterile fashion.  Skin and subcutaneous tissues were  instilled with 1% Xylocaine.  An 18-gauge needle was introduced into the  right common femoral artery.  A 0.035 Wholey guidewire advanced through  the needle into the mid abdominal aorta.  A 5-French sheath advanced  over the guidewire.   A long pigtail catheter advanced over the guidewire.  Guidewire and  catheter advanced into the ascending aorta.  In 40 degree LAO  projection, arch aortogram obtained.  This revealed the great vessels to  be widely patent.  Origin of the innominate, left carotid and left  subclavian widely patent.   Small left vertebral artery arising directly  from the aortic arch.  Large dominant right vertebral artery with  antegrade flow.   An exchange was then made for an H1 catheter.  The H1 catheter engaged  into the innominate artery.  The guidewire advanced into the right  common carotid and the catheter advanced in the right common carotid  artery.   Right carotid arteriography obtained with cervical and intracranial  views.  Cervical images revealed widely patent right carotid bifurcation  status post endarterectomy.  Intracranial views dictated separately  under radiology heading.   The H1 catheter then withdrawn back into the aortic arch.  Engaged in  the left common carotid artery.  Advanced into the left common carotid  artery.   Left carotid arteriography obtained with cervical and intracranial  views.  Cervical views revealed the left carotid system to be status  post stenting extending from the proximal left internal carotid artery  across the carotid bulb and in the left common carotid artery.  There  was an area of recurrent stenosis in left common carotid artery  estimated  to be 70-80%, the proximal left common carotid artery within  the stent.  The left internal carotid artery was otherwise patent.  Intracranial views dictated separately by Radiology.   This completed the arteriogram procedure.  No apparent complications.  The patient tolerated the procedure well.  Guidewire reinserted and  catheter removed.  Right femoral sheath removed.  No apparent  complications.   FINAL IMPRESSION:  1. Widely patent right carotid bifurcation status post endarterectomy.  2. 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   DISPOSITION:  These results will be reviewed further with the patient  and family.  The patient is scheduled to have redo left carotid stenting  for recurrent stenosis.      Balinda Quails, M.D.  Electronically Signed      PGH/MEDQ  D:  08/06/2008  T:  08/07/2008  Job:  [id number]   cc:   Madolyn Frieze. Jens Som, MD, [facility]  Pramod P. Pearlean Brownie, MD

## 2010-11-09 NOTE — Consult Note (Signed)
NEW PATIENT CONSULTATION   Clinton [name], Clinton [name]  DOB:  1941-01-18                                       02/22/2007  YTKZS#:[id number]   REASON FOR CONSULTATION:  Recurrent left internal carotid artery  stenosis.   HISTORY:  Clinton [name] is a 70 year old male with a history of fairly  extensive atherosclerotic vascular disease.  He underwent coronary  artery bypass in 1985.  Had a left carotid endarterectomy carried out in  1993.  Aortobifemoral bypass along with a right ureteral artery renal  bypass carried out in 2004, and subsequent right carotid endarterectomy  carried out in July of 2004.   With evidence of severe, recurrent, left internal carotid artery  stenosis with velocities 300/103 cm per second.  He was lost to followup  since that time, however, recently underwent a carotid Doppler  evaluation at the [facility] revealing velocities in his mid  left internal carotid artery at 495/181 cm per second.  This represents  progressive, severe, recurrent, left internal carotid artery stenosis.   Fortunately, Clinton [name] denies any symptoms.  He has had no sensory,  motor, or visual deficit.  No gait abnormalities.  Denies speech  problems.   PAST MEDICAL HISTORY:  1. Coronary artery disease status post coronary artery bypass in 1985.  2. Ischemic cardiomyopathy.  3. Peripheral vascular disease.  4. Remote CVA.  5. Hypertension.  6. Hyperlipidemia.  7. Tobacco abuse.   MEDICATIONS:  1. Coumadin 6 mg daily.  2. Imdur 30 mg daily.  3. Lipitor 80 mg daily.  4. Zetia 10 mg daily.  5. Hydrochlorothiazide 12.5 mg daily.  6. Norvasc 10 mg daily.  7. Cozaar 100 mg daily.  8. Toprol 25 mg daily.  9. Aspirin 81 mg daily.   ALLERGIES:  CODEINE.   SOCIAL HISTORY:  The patient is divorced.  He has four children.  He is  retired as a Chief Technology Officer.  He smokes about one to three  cigarettes daily.  Heavy tobacco use in the past.  Does not  consume  alcohol.   FAMILY HISTORY:  Mother died at age 24 with a history of coronary artery  disease.  Father died at age 2, coronary artery disease.  He has two  brothers deceased of coronary artery disease, age 5 and 89.   REVIEW OF SYSTEMS:  Refer to Patient Encounter Form.  The patient denies  any recent weight change.  No anorexia.  No cough or sputum production.  No shortness of breath or chest pain.  Denies change in bowel habits.  No urinary tract symptoms.   PHYSICAL EXAMINATION:  General:  A 70 year old male appears  approximately his stated age.  Alert and oriented.  Vital signs:  BP  163/67 left arm, 177/68 right arm, pulse is 62 per minute, respirations  are 16 per minute:  HEENT:  Mouth and throat are clear.  Normocephalic.  Extraocular movements intact.  Neck:  Supple, no thyromegaly or  adenopathy.  Chest:  Equal air entry bilaterally without rales or  rhonchi.  Cardiovascular:  Bilateral carotid bruits.  Regular rate and  rhythm.  2/6 systolic murmur at left sternal border.  Abdomen:  Soft and  nontender. No masses.  No organomegaly.  Extremities:  2+ femoral pulses  bilaterally.  No ankle edema.  Neurologic:  The patient is alert  and  oriented.  Cranial nerves intact.  Strength equal bilaterally.  2+  reflexes bilaterally.   IMPRESSION:  1. Asymptomatic, severe, recurrent, left internal carotid artery      stenosis.  2. Coronary artery disease.  3. Ischemic cardiomyopathy.  4. Peripheral vascular disease.  5. Remote history of cerebrovascular accident.  6. Hypertension.  7. Hyperlipidemia.  8. History of tobacco abuse.   RECOMMENDATIONS:  The patient will be referred to Dr. Delia Heady for a  pre-carotid stent evaluation.  He fulfills SAPPHIRE criteria for stent  placement due to the recurrent nature of his left ICA stenosis and the  history of ischemic cardiomyopathy.   Clinton [name], M.D.  Electronically Signed   PGH/MEDQ  D:  02/22/2007  T:   02/23/2007  Job:  [id number]   cc:   Madolyn Frieze. Jens Som, MD, [facility]  Delia Heady, M.D.  [facility]

## 2010-11-09 NOTE — Consult Note (Signed)
NAME:  Clinton [name], Clinton [name] NO.:  [id number]   MEDICAL RECORD NO.:  [id number]          PATIENT TYPE:  AMB   LOCATION:  SDS                          FACILITY:  [facility]   PHYSICIAN:  Marin Roberts, MDDATE OF BIRTH:  05-21-1941   DATE OF CONSULTATION:  08/06/2008  DATE OF DISCHARGE:  08/06/2008                                 CONSULTATION   REASON FOR CONSULTATION:  Intracranial interpretation of carotid  arteriogram on August 06, 2008.   FINDINGS:  Injection of the right common carotid artery demonstrates  moderate irregularity of the cavernous and precavernous right internal  carotid artery without focal stenosis.  A fetal-type right posterior  cerebral artery is present.  There is some irregularity at the overlap  of the right MCA bifurcation and posterior cerebral artery on the  lateral view.  This is incompletely evaluated by these 2 images.  The  dural sinuses fill normally.  The anterior communicating artery fills  with flash filling of the left A2 segments.   The left common carotid injection demonstrates irregularity through the  cavernous segment of the internal carotid artery without focal stenosis.  There is no cross filling of the anterior communicating artery from this  injection.  There is no significant stenosis, aneurysm, or branch vessel  occlusion.   IMPRESSION:  1. Irregularity of the cavernous internal carotid arteries bilaterally      without significant stenosis.  2. No other significant intracranial stenosis, aneurysm, or branch      vessel occlusion.      Marin Roberts, MD  Electronically Signed     CM/MEDQ  D:  08/06/2008  T:  08/07/2008  Job:  [id number]

## 2010-11-09 NOTE — Assessment & Plan Note (Signed)
OFFICE VISIT   Clinton [name], Clinton [name]  DOB:  [birth year]                                       11/20/2008  ZOXWR#:[id number]   The patient underwent redo carotid stenting for recurrent left carotid  stenosis October 22, 2008, at [facility].  This was an uneventful  procedure.  No periprocedure complications evident.   Since that time he has no major complaints.  Blood pressure 165/74,  pulse 64 per minute.  Neck reveals no bruits.  Right groin is well-  healed without false aneurysm.   Note is made in the right groin of an inguinal hernia.   The patient is doing well following his recent procedure.  Will plan  follow-up per protocol with carotid Doppler in 6 months.  Refer to  [facility] for an opinion regarding right inguinal hernia.   Balinda Quails, M.D.  Electronically Signed   PGH/MEDQ  D:  11/20/2008  T:  11/21/2008  Job:  [id number]   cc:   Madolyn Frieze. Jens Som, MD, [facility]  Pramod P. Pearlean Brownie, MD

## 2010-11-09 NOTE — Procedures (Signed)
CAROTID DUPLEX EXAM   INDICATION:  Follow up carotid artery disease.   HISTORY:  Diabetes:  No.  Cardiac:  MI, CABG in 1985.  Hypertension:  Yes.  Smoking:  Yes.  Previous Surgery:  Left ICA and CCA stent, 06/15/2007 by Dr. Madilyn Fireman.  Left CEA on 07/05/1991, right CEA 12/26/1992, both performed by Dr.  Madilyn Fireman.  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  Amaurosis Fugax No, Paresthesias No, Hemiparesis No.                                       RIGHT             LEFT  Brachial systolic pressure:         196               200  Brachial Doppler waveforms:         WNL               WNL  Vertebral direction of flow:        Antegrade         Antegrade,  atypical  DUPLEX VELOCITIES (cm/sec)  CCA peak systolic                   103               Proximal/mid = 318  ECA peak systolic                   154               339  ICA peak systolic                   90                108  ICA end diastolic                   33                37  PLAQUE MORPHOLOGY:                  Calcified         Homogenous  PLAQUE AMOUNT:                      Mild              Mild  PLAQUE LOCATION:                    Proximal ICA      ICA/ECA   IMPRESSION:  1. Right internal carotid artery shows evidence of 1-39% stenosis.  2. Left internal carotid artery shows no evidence of significant      stenosis.  3. Left common carotid artery, proximal/mid, approximately 1.22 cm      into stent has elevated velocities of 318/74 cm/s, which is an      increase from previous study at 134 cm/s (stent appears narrower      there).  4. Left external carotid artery stenosis.  5. Left vertebral artery is antegrade; however, atypical.  Right      vertebral artery is antegrade.   Dr. Myra Gianotti was informed of the results and follow-up appointment  scheduled with Dr. Madilyn Fireman for next week.  ___________________________________________  P. Liliane Bade, M.D.   AS/MEDQ  D:  07/07/2008  T:   07/07/2008  Job:  [id number]

## 2010-11-09 NOTE — Assessment & Plan Note (Signed)
[facility]                            CARDIOLOGY OFFICE NOTE   Clinton [name], Clinton [name]                    MRN:          [id number]  DATE:07/25/2008                            DOB:          12/13/1940    [name] returns for followup today.  I last saw him on July 03, 2008.  At that time, we scheduled him to have a Myoview.  This was  performed on July 11, 2008.  His ejection fraction was 35%.  There  was a small inferobasal wall infarct with significant ischemia in the  lateral and anterolateral wall per Dr. Fabio Bering read.  I have reviewed  this and there is ischemia in the lateral wall.  Compared to the  previous Myoview performed on March 27, 2006, the ejection fraction is  reduced and ischemia may be slightly worse.  However, it is in the same  distribution.  Since I saw him, he denies any new chest pain.  He does  have dyspnea on exertion, which has been a chronic issue and unchanged  since I last saw him.  It is promptly relieved with rest.  It is not  associated with a cough or chest pain.  It does not occur at rest.  He  also has apparently been found to have significant carotid disease and  is scheduled for a possible repeat carotid stent next week.   MEDICATIONS:  1. Imdur 60 mg p.o. daily.  2. Lipitor 80 mg p.o. daily.  3. Toprol 25 mg p.o. daily.  4. Norvasc 10 mg p.o. daily.  5. Zetia 10 mg p.o. daily.  6. Cozaar 100 mg p.o. daily.  7. Hydrochlorothiazide 12.5 mg p.o. daily.  8. Plavix 75 mg p.o. daily.   Note, he is out of his blood pressure medicines.   PHYSICAL EXAMINATION:  VITAL SIGNS:  Today, shows a blood pressure of  215/94 and his pulse is 79.  He weighs 190 pounds.  HEENT:  Normal.  NECK:  Supple.  CHEST:  Clear.  CARDIOVASCULAR:  Regular rate and rhythm.  There is a 2/6 systolic  murmur at the left sternal border.  ABDOMEN:  No tenderness.  EXTREMITIES:  No edema.   DIAGNOSIS:  1. Abnormal nuclear study -  I have reviewed [name] study from      July 11, 2008, and compared to the previous study in 2007.  The      ischemia is slightly worse and the ejection fraction is down      compared to then.  However, he is not having chest pain.  I do not      think it is a high-risk study.  We will continue with medical      therapy.  If he develops chest pain, then we can proceed with      cardiac catheterization at that point.  Note, he is having carotid      stent in the near future, but I do not think we need to delay that      procedure based on his Myoview results.  2. Coronary artery disease  status post coronary artery bypassing graft      - he will continue his aspirin, statin, beta-blocker, and ARB.  3.      Tobacco abuse - I have discussed the importance of discontinuing      this for between 3-10 minutes.  3. Ischemic cardiomyopathy - we will plan to repeat his      echocardiogram.  If his ejection fraction is 35% or less, then we      will plan to refer to electrophysiologist for possible implantable      cardioverter-defibrillator.  4. Peripheral vascular disease - he will continue on his aspirin,      statin and CVTS is planning a carotid stent next week.  5. History of renal artery stenosis - we did repeat his renal Dopplers      on July 11, 2008, and there was no significant stenosis.  6. Hypertension - his blood pressure is elevated, but he is not taking      his hypertension medications.  We will resume these.  7. Hyperlipidemia - he will continue on his statin and Zetia.  We will      plan to check lipids and liver and adjust as indicated.  8. History of Coumadin therapy - He is off this for his procedure.   I will see him back in 3 months.     Madolyn Frieze Jens Som, MD, [facility]  Electronically Signed    BSC/MedQ  DD: 07/25/2008  DT: 07/25/2008  Job #: [id number]

## 2010-11-09 NOTE — Cardiovascular Report (Signed)
[name], Clinton NO.:  [id number]   MEDICAL RECORD NO.:  [id number]          PATIENT TYPE:  INP   LOCATION:  2305                         FACILITY:  [facility]   PHYSICIAN:  Nanetta Batty, M.D.   DATE OF BIRTH:  Jan 06, 1941   DATE OF PROCEDURE:  06/15/2007  DATE OF DISCHARGE:  06/16/2007                            CARDIAC CATHETERIZATION   PROCEDURE:  Cerebral angiogram.   Clinton [name] is a 70 year old gentleman with history of ischemic  cardiomyopathy, remote CABG and carotid endarterectomy, with multiple  other comorbidities.  He was evaluated by Dr. Madilyn Fireman with progressively  increasing velocities in his left internal carotid artery, the site of  his prior endarterectomy.  He has been neurologically asymptomatic and  cardiovascularly stable.  He was deemed to be a candidate for theSAFIR  registry.  He presents now for cerebral angiography prior to potential  stenting.   DESCRIPTION OF PROCEDURE:  The patient was brought to the second floor  [facility] angiographic suite in the postabsorptive state.  He was  not premedicated.  His right groin was prepped and shaved in usual  sterile fashion.  Xylocaine 1% was used for local anesthesia.  A 6-  French sheath was inserted into the right femoral artery using standard  Seldinger technique.  A 6-French tennis racket catheter along with a JB1  catheter were used for arch angiography, selective right vertebral,  right and left carotid angiography.  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  Visipaque dye was used for the entirety of  the case.  Retrograde aortic pressures were monitored throughout the  case.   ANGIOGRAPHIC RESULTS:  1. Aortic arch; type 1.  2. Right vertebral; dominant, widely patent with normal intracranial      posterior circulation.  3. Right carotid; normal with extra and intracranial circulation.      Patently filled the anterocerebral.  4. Left carotid; left carotid had  60-70% diffuse, segmental distal      common ulcerated stenosis.  There was a 90% stenosis in the      internal carotid just beyond the bifurcation.  The intracranial      left carotid anatomy was normal.   IMPRESSION:  [name] has high-grade left internal carotid artery  carotid endarterectomy restenosis, with moderate ulcerated disease in  the common as well.  Anatomically he is a good candidate to undergo  carotid artery stenting, as part of the SAFIR registry.      Nanetta Batty, M.D.  Electronically Signed     JB/MEDQ  D:  06/15/2007  T:  06/16/2007  Job:  [id number]   cc:   Pramod P. Pearlean Brownie, MD

## 2010-11-09 NOTE — Consult Note (Signed)
NAMEZANDON, TALTON NO.:  [id number]   MEDICAL RECORD NO.:  [id number]          PATIENT TYPE:  INP   LOCATION:  2305                         FACILITY:  [facility]   PHYSICIAN:  Marin Roberts, MDDATE OF BIRTH:  May 21, 1941   DATE OF CONSULTATION:  DATE OF DISCHARGE:  06/16/2007                                 CONSULTATION   I was consulted to review the intracranial portion of the patient's  bilateral carotid arteriogram performed June 15, 2007.  Injection of  the right common carotid artery demonstrates moderate irregular of the  pre-cavernous and cavernous, and right internal carotid artery.  There  is a fetal type right posterior cerebral artery.  M1 and A1 segments  were unremarkable.  There is no focal stenosis, aneurysmal branch vessel  occlusion.  There is no significant filling of the anterior  communicating artery.   Injection of the left internal carotid artery demonstrates somewhat more  pronounced atherosclerotic irregularity of the pre-cavernous and  cavernous internal carotid artery, without focal stenosis.  The A1 and  M1 segments are unremarkable.  There is no focal stenosis or branch  vessel occlusion.  The anterior communicating artery does not  significantly fill from this injection either.   IMPRESSION:  1. Distal internal carotid artery atherosclerotic disease within the      pre-cavernous and cavernous carotid arteries bilaterally, left      greater than right.  2. No significant stenosis or branch vessel occlusion.      Marin Roberts, MD  Electronically Signed     CM/MEDQ  D:  06/29/2007  T:  06/29/2007  Job:  [id number]

## 2010-11-09 NOTE — Procedures (Signed)
CAROTID DUPLEX EXAM   INDICATION:  Follow up carotid artery disease   HISTORY:  Diabetes:  no  Cardiac:  MI, CABG 1985  Hypertension:  yes  Smoking:  yes  Previous Surgery:  Left ICA and CCA stent on 06/15/2007 by Dr. Madilyn Fireman;  left carotid endarterectomy on 07/05/1991; right CEA on 12/26/1992.  CV History:  no  Amaurosis Fugax No, Paresthesias No, Hemiparesis No                                       RIGHT             LEFT  Brachial systolic pressure:         170               172  Brachial Doppler waveforms:         WNL               WNL  Vertebral direction of flow:        antegrade         bidirectional  DUPLEX VELOCITIES (cm/sec)  CCA peak systolic                   130               120  ECA peak systolic                   127               287  ICA peak systolic                   114               75  ICA end diastolic                   23                20  PLAQUE MORPHOLOGY:                  calcified         Heterogeneous  PLAQUE AMOUNT:                      mild              mild  PLAQUE LOCATION:                    ICA               ECA   IMPRESSION:  1. Right internal carotid artery suggests 20% to 39% stenosis.  2. Left internal carotid artery shows no restenosis.  3. Left external carotid artery suggests no stenosis.  4. Right vertebral appears antegrade, however the left vertebral      appears bidirectional.   ___________________________________________  Di Kindle. Edilia Bo, M.D.   CB/MEDQ  D:  06/05/2009  T:  06/05/2009  Job:  [id number]

## 2010-11-09 NOTE — Assessment & Plan Note (Signed)
OFFICE VISIT   Clinton [name], Clinton [name]  DOB:  [birth year]                                       07/24/2008  OZHYQ#:[id number]   The patient is a 70 year old gentleman with a history of left carotid  endarterectomy carried out in 1993, right carotid endarterectomy carried  out in 2004.  He had left carotid stenting carried out in December of  2008.  He has remained asymptomatic.  Denies visual, sensory or motor  deficit.  No speech problems.   His past medical history also includes coronary artery disease with CABG  carried out in 1985.  He has a history of hypertension and  hyperlipidemia.   He underwent surveillance carotid Doppler evaluation on 07/07/2008 which  reveals a marked increase in velocities within the left carotid stent at  318/74 cm/sec.  This is an increase from previous study at 134 cm/sec.  Stent appears to have a narrowing at this during imaging.   The patient is a 70 year old gentleman who appears approximately his  stated age.  Blood pressure 211/92, pulse is 51 per minute.  Left  carotid bruit audible.  Cranial nerves intact.  Strength equal  bilaterally.  1+ reflexes.   The patient shows evidence of recurrent stenosis within the stent of his  left common carotid artery.  I will therefore go ahead and schedule him  to undergo a cerebral arteriogram next week with plans for possible  reintervention.   He will discontinue his Coumadin at this time, continue aspirin 81 mg  per day and begin Plavix 75 mg daily.   Balinda Quails, M.D.  Electronically Signed   PGH/MEDQ  D:  07/24/2008  T:  07/25/2008  Job:  [id number]   cc:   Pramod P. Pearlean Brownie, MD  Madolyn Frieze. Jens Som, MD, [facility]

## 2010-11-12 NOTE — Discharge Summary (Signed)
NAME:  Clinton [name], Clinton [name] NO.:  [id number]   MEDICAL RECORD NO.:  [id number]                   PATIENT TYPE:  INP   LOCATION:  4740                                 FACILITY:  [facility]   PHYSICIAN:  Veneda Melter, M.D. [facility]               DATE OF BIRTH:  1941-03-26   DATE OF ADMISSION:  06/12/2002  DATE OF DISCHARGE:  06/16/2002                           DISCHARGE SUMMARY - REFERRING   HISTORY OF PRESENT ILLNESS:  The patient is a 70 year old gentleman with  known coronary disease per prior coronary artery bypass graft, hypertension  which has recently been difficult to control, normal renal function but a  small left kidney and bilateral lower extremity claudication. He underwent  abdominal  aortography at the time of the cardiac catheterization in late  October and bilateral renal artery stenosis as well as severe disease of  both iliacs was noted. He had subsequent duplex ultrasound of his renal  arteries which demonstrated increased velocities  bilaterally. His ABI was  0.44 on the right and 0.53 on the left. He was therefore brought in for  peripheral angiography and for further evaluation.   This was done by Dr. Samule Ohm. He was noted to have 80% renal artery stenosis  bilaterally and 90% diffusely diseased aortoiliac disease. His SFAs looked  fairly clean. He had normal three vessel runoff on the right and two vessel  runoff on the left. It was felt that  he needed aorto bifemoral bypass graft  surgery and bilateral  renal bypass graft surgery. He was seen in  consultation for this by Dr. Madilyn Fireman, who concurred with such and the plan is  to proceed with  this in a month or so.   He also had a cardiac catheterization. He was actually to have gone home  after his aortography, but because his groin was oozing, he was kept  overnight and the decision was made to keep him here to proceed with  circumflex intervention that had been scheduled. This was done by  Dr.  Samule Ohm, who opened his ramus and circumflex  ostia with kissing balloon  angioplasty and rotational atherectomy with balloon angioplasty. He did have  a slight rise in his cardiac enzymes, so we kept him one extra night. His CK-  MB rose to 709/11.6, and this was declining by  the following morning when  he was discharged.   LABORATORY DATA:  Hemoglobin 11.4, hematocrit 33.4, white count 6900.  Electrolytes and renal function were normal.   IMPRESSION:  1. Coronary artery disease with prior coronary artery bypass graft. He     underwent rotational atherectomy and angioplasty using kissing balloon     technique of the circumflex and ramus this admission.  2. Peripheral vascular disease with 80% bilateral renal artery stenosis and     90% diffuse bilateral iliac disease with not much disease in the     superficial femoral artery and beyond.  The plan is to proceed with     aortofemoral bypass  graft and bilateral renal artery bypass graft     surgery in about a month by Dr. Madilyn Fireman.  3. Long history of cigarette use. He has quit so  far.  4. History of hypertension.  5. Hyperlipidemia.  6. Left ventricular ejection fraction 35%.    DISCHARGE MEDICATIONS:  The patient was discharged to home on the same  medications  plus Plavix 75 mg q.d. x 1 month.   FOLLOW UP:  He will follow up with Dr. Jens Som in two to three weeks.      Dian Queen, P.A. [facility]                     Veneda Melter, M.D. [facility]    BY/MEDQ  D:  06/16/2002  T:  06/16/2002  Job:  [id number]

## 2010-11-12 NOTE — Op Note (Signed)
NAME:  Clinton [name], Clinton [name] NO.:  [id number]   MEDICAL RECORD NO.:  [id number]                   PATIENT TYPE:  INP   LOCATION:  3308                                 FACILITY:  [facility]   PHYSICIAN:  Balinda Quails, M.D.                 DATE OF BIRTH:  25-Apr-1941   DATE OF PROCEDURE:  12/27/2002  DATE OF DISCHARGE:                                 OPERATIVE REPORT   SURGEON:  Balinda Quails, M.D.   ASSISTANTS:  Larina Earthly, M.D. and Coral Ceo, P.A.   ANESTHESIA:  General endotracheal anesthesia, anesthesiologist Maren Beach, M.D.   PREOPERATIVE DIAGNOSIS:  Severe right internal carotid artery stenosis.   POSTOPERATIVE DIAGNOSIS:  Severe right internal carotid artery stenosis.   PROCEDURE:  Right carotid endarterectomy, Dacron patch angioplasty.   CLINICAL NOTE:  Clinton [name] is a 70 year old male with a history of  advanced  atherosclerotic vascular disease. He has previously undergone left  carotid endarterectomy approximately 10 years ago. He has had coronary  artery bypass and aortobifemoral bypass. Recent Doppler  evaluation revealed  a severe right internal carotid artery stenosis. He underwent  arteriography  which verified the severe right internal artery stenosis and moderate  recurrent left internal carotid artery stenosis. He is brought to the  operating room at this time for right carotid endarterectomy.   The risks and benefits of the operative procedure were explained to the  patient in detail. The major morbidity and mortality associated this  operation approximately 1% to 2% including but not limited to MI, CVA and  death.   DESCRIPTION OF PROCEDURE:  The patient was brought to the operating room in  stable condition. He was placed in the supine position. General endotracheal  anesthesia was induced. An arterial line and a Foley catheter were then  placed. The right neck was prepped and draped in a sterile fashion.   A  curvilinear skin incision was made along the anterior border of the right  sternomastoid muscle. Dissection was carried out through the subcutaneous  tissue. The platysma was divided with electrocautery. Deep dissection was  carried along the anterior border of the sternomastoid to the carotid  sheath. The common carotid artery was mobilized proximally  down to the  omohyoid muscle and encircled with a vessel loop.   Distal dissection was carried along the common carotid artery up to the  bifurcation. The vagus nerve was identified posteriorly and preserved. The  origin of the superior thyroid and the external carotid were encircled with  the vessel loops. The internal carotid artery was followed  distally up to  the posterior belly of the digastric muscle and encircled with a vessel  loop.   The carotid bifurcation revealed moderate calcified plaque disease extending  into the origin of the right internal carotid artery. The patient was  administered 7000 units of heparin intravenously. Adequate circulation time  was permitted. The carotid vessel was controlled with clamps.   A longitudinal arteriotomy was made in the distal common carotid artery. The  arteriotomy was extended across the carotid bulb and up into the internal  carotid artery. There was a tight stenosis at the origin of the right  internal carotid artery estimated to be greater than 80%. A shunt was then  inserted.   The plaque was removed with an endarterectomy elevator. The plaque was  raised down into the common carotid artery where it was divided transversely  with the Potts scissors. The plaque was then raised up into the bulb where  the superior  thyroid and external carotid were endarterectomized using the  eversion technique. The distal internal carotid artery plaque was removed  and feathered out well. Fragments of plaque were removed with plaque  forceps. The site was irrigated with heparinized saline  solution.   A Finesse Dacron patch was then placed over the endarterectomy site using  running 6-0 Prolene suture. At completion of the patch angioplasty the shunt  was removed. The clamps were removed directing initial antegrade flow up the  external carotid artery. Following  this the internal carotid artery was  released.   There was an excellent pulse and Doppler signal in the distal internal  carotid artery. The patient was administered 50 mg of Protamine  intravenously. Adequate hemostasis was achieved. Sponge and instrument  counts were correct.   The sternomastoid fascia was then closed with a running 2-0 Vicryl suture.  The platysma was closed with running 3-0 Vicryl suture. The skin  was  approximated with 4-0 Monocryl. Steri-Strips were applied. A sterile  dressing was applied.   The patient tolerated the procedure well. He was transferred to the recovery  room in stable condition neurologically intact.                                               Balinda Quails, M.D.    PGH/MEDQ  D:  12/27/2002  T:  12/28/2002  Job:  [id number]   cc:   Olga Millers, M.D.

## 2010-11-12 NOTE — Op Note (Signed)
NAME:  Clinton [name], Clinton [name] NO.:  [id number]   MEDICAL RECORD NO.:  [id number]                   PATIENT TYPE:  OIB   LOCATION:  2869                                 FACILITY:  [facility]   PHYSICIAN:  Balinda Quails, M.D.                 DATE OF BIRTH:  03/03/1941   DATE OF PROCEDURE:  12/19/2002  DATE OF DISCHARGE:  12/19/2002                                 OPERATIVE REPORT   PREOPERATIVE DIAGNOSIS:  Extracranial cerebrovascular occlusive disease.   PROCEDURE:  1. Arterial aortogram.  2. Innominate arteriogram.  3. Bilateral selective carotid arteriograms.   ACCESS:  Right common femoral artery 5 French sheath.   CONTRAST:  118 mL Visipaque.   COMPLICATIONS:  None apparent.   INDICATIONS FOR PROCEDURE:  The patient is a 70 year old vasculopathic with  a history of heavy tobacco abuse. He has previously undergone coronary  artery bypass, aortobifemoral bypass, renal artery bypass, and a left  carotid endarterectomy.  He was recently noted to have a right carotid bruit  and the Doppler revealed evidence of severe right internal carotid artery  stenosis, moderate recurrent left internal carotid artery stenosis, and  possible proximal brachial cephalic disease.  He is brought to the  catheterization lab at this time for diagnostic arteriography.  Risks of  this procedure including approximately 1% major complication rate were  discussed with the patient prior to the procedure.   DESCRIPTION OF PROCEDURE:  The patient was brought to the catheterization  lab in stable condition.  Placed in the supine position.  Both groins were  prepped and draped in the usual sterile fashion.  Skin and subcutaneous  tissue in the right groin instilled with 1% Xylocaine. The needle was easily  introduced into the right common femoral artery.  A 0.035 Rosen guide wire  was advanced through the needle. This was easily advanced into the aortic  graft and up into the abdominal  aorta. A 5 French sheath was then advanced  over the guide wire. The sheath flushed with heparin and saline solution. A  pigtail catheter was then advanced over the guide wire. The guide wire and  pigtail were advanced into the aortic arch and into the ascending aorta.   A 30 degree projection LAO arch aortogram was obtained.  This revealed a  widely patent innominate artery. There was overlap of the origin of the  right subclavian and right common carotid arteries. The proximal right  subclavian artery appeared widely patent.  The midportion of the right  common carotid artery was also widely patent. There was a large right  vertebral artery which was dominant.  The left vertebral artery was noted to  arise from the aortic arch. This was also patent. The left common carotid  artery was widely patent proximally.  In its midportion there was evidence  of recurrent disease with atherosclerotic irregularity. The proximal left  subclavian artery was  widely patent.   A posterior 2.5 Wooley guide wire was then advanced through the pigtail.  Catheter exchange carried out for a JV1 catheter.  The Wooley and JV1 were  advanced back into the arch.  The JV1 was engaged into the origin of the  innominate artery. The [facility] guide wire advanced into the right common  carotid artery. Right carotid arteriography obtained for cervical and  intracranial views. The cervical carotid arteriogram revealed widely patent  common carotid artery. There was a high grade stenosis of the origin of the  right internal carotid artery estimated to be 80%. The right external  carotid artery appeared widely patent. The JV1 catheter was then drawn back  into the innominate artery. A 30 degree RAO projection innominate  arteriogram was obtained.  This revealed the origin of the right common  carotid artery to be widely patent. There was a mild stenosis at the origin  of the right subclavian artery estimated to be  approximately 30%. The right  vertebral origin was widely patent.   The [facility] guide wire then readvanced into the JV1 catheter and the JV1  brought into the arch and engaged into the left common carotid artery.  Left  carotid arteriography obtained in the cervical and intracranial views. There  was evidence of recurrent disease at the left carotid bifurcation. The  patient is status post carotid endarterectomy.  The left internal carotid  artery revealed approximately a 40 to 50% recurrent stenosis.  The left  common carotid artery just proximal to the bifurcation also revealed  recurrent atherosclerosis with multiple mild to moderate stenoses estimated  to be 30 to 50%.   Intracranial views will be dictated under a separate heading by  neuroradiology.   This completed the arteriogram procedure. The patient tolerated the  procedure well. Guide wire reinserted and the catheter and guide wire  removed. There were no apparent complications.   The patient is transferred to the holding area neurologically intact.  Right  femoral sheath removed.   These results have been discussed with the patient and the family.  It is  recommended that the patient undergo elective right carotid endarterectomy  for reduction of stroke risk.   IMPRESSION:  1. 80% right internal carotid artery stenosis.  2. Approximately 50% recurrent left internal carotid artery stenosis.   DISPOSITION:  The patient will be scheduled for elective right carotid  endarterectomy.                                               Balinda Quails, M.D.    PGH/MEDQ  D:  12/19/2002  T:  12/20/2002  Job:  [id number]   cc:   Olga Millers, M.D.

## 2010-11-12 NOTE — Cardiovascular Report (Signed)
NAME:  Clinton [name], Clinton [name] NO.:  [id number]   MEDICAL RECORD NO.:  [id number]                   PATIENT TYPE:  OIB   LOCATION:  2899                                 FACILITY:  [facility]   PHYSICIAN:  Salvadore Farber, M.D. [facility]         DATE OF BIRTH:  23-Aug-1940   DATE OF PROCEDURE:  04/23/2002  DATE OF DISCHARGE:                              CARDIAC CATHETERIZATION   PROCEDURE:  Left heart catheterization, left ventriculography, coronary  angiography, saphenous vein graft angiography x2, selective right and left  subclavian angiography, right internal mammary artery and left internal  mammary artery angiography, abdominal aortogram.   INDICATIONS FOR PROCEDURE:  The patient is a 70 year old man status post  distant coronary artery bypass grafting.  [facility] demonstrated a severe ostial stenosis of the  circumflex, occluded vein graft to ramus, occluded vein graft to obtuse  marginal, and patent LIMA to LAD, and patent RIMA to distal RCA.  He has  continued to have Class III symptoms.  He is referred for diagnostic  angiography.   DIAGNOSTIC TECHNIQUE:  Informed consent was obtained.  Under 1% lidocaine  local anesthesia using a Smart needle, a #6 French sheath was placed in the  left femoral artery using the modified Seldinger technique.  A JL4 catheter  was used to engage the native left coronary artery.  A JR4 catheter was used  to engage the native right.  A LIMA catheter was used to selectively engage  the left internal mammary artery and to perform left subclavian angiogram.  A JR4 catheter was used to perform selective right subclavian angiography  and nonselective angiography of the right internal mammary artery.  A #6  French pigtail catheter was manipulated in the left ventricle.  Pressures  were measured.  Ventriculography was performed by power injection.  The  catheter was then pulled  back to the abdominal aorta.  Abdominal aortography  was performed by power injection.  The patient tolerated the procedure well  and was transferred to the holding room in stable condition.   COMPLICATIONS:  None.   FINDINGS:  1. Ejection fraction equals 36% with anterolateral hypokinesis and     posterobasal akinesis.  2. Mitral regurgitation, 1+, and no aortic stenosis.  3. Left main:  There is a 40% stenosis of the distal left main.  4. LAD:  The LAD is occluded at its ostium.  The sequential LIMA graft to     diagonal and LAD is widely patent.  There is no significant disease of     the downstream vessels.  5. Ramus:  There is a 60% stenosis of the mid portion of the vessel.  6. Circumflex:  There is an ostial 90-95% stenosis with moderate     calcification.  There is a 30% stenosis of the proximal vessel.  The     circumflex gives rise to a single moderate-sized  obtuse marginal branch.     There is a 70% stenosis within this branch.  7. Saphenous vein graft to the ramus intermedius and the saphenous vein     graft to the obtuse marginal are both occluded.  8. RCA:  The RCA is occluded proximally.  The RIMA to the distal RCA is     widely patent; however, there is a 70% stenosis of the distal     anastomosis.  The PDA is a small vessel with a 70% mid stenosis.  9. Abdominal aortography demonstrates severe diffuse disease of the     abdominal aorta.  The celiac access, SMA, and IMA are all patent.  There     are two left renal arteries.  Both have at least moderate stenoses.     There is a single right renal artery.  There is a mild to moderate     (approximately 50%) stenosis of its ostium.  The right common iliac     artery is occluded at its ostium.  The left common iliac artery has     severe (greater than 90%) ostial stenosis.   IMPRESSION/PLAN:  1. Coronaries:  Severe native vessel disease with occluded vein grafts to     ramus intermedius and obtuse marginal.  The left  anterior descending     artery territory is well perfused via the left internal mammary artery     graft.  The distal right coronary artery is a relatively small territory     and is attended by a patent right internal mammary artery graft.  The     flow is compromised by a 70% stenosis of the distal anastomosis.  The     ostium of the circumflex has a 90% calcified stenosis.  This stenosis is     clearly responsible for the large area of lateral ischemia demonstrated     on his Cardiolite.  This circumflex territory is approachable     percutaneously but is relatively high risk given the need for Rotablator     in the setting of his decreased ejection fraction and the inability to     use an intra-aortic balloon pump due to his severe peripheral vascular     disease.  Therefore, I recommend a trial of medical therapy with     reconsideration of percutaneous coronary intervention if his symptoms     remain intolerable.  2. Renal artery stenosis:  He has at least moderate renal artery stenosis of     both left vessels.  I suspect the inferior vessel is, in fact, severe.     There is mild to moderate stenosis of the right renal artery.  3. Occluded right common iliac artery.  Severe stenosis of the left internal     iliac artery.  Strong consideration should be given to revascularization     of the left iliac artery to preserve access for further cardiac     catheterization procedures.                                               Salvadore Farber, M.D. [facility]    WED/MEDQ  D:  04/23/2002  T:  04/23/2002  Job:  [id number]   cc:   Olga Millers, MD [facility]

## 2010-11-12 NOTE — Assessment & Plan Note (Signed)
[facility]                              CARDIOLOGY OFFICE NOTE   Clinton [name], Clinton [name] Clinton [name]                    MRN:          [id number]  DATE:03/14/2006                            DOB:          1940/08/08    Clinton [name] is a gentleman who has a history of coronary disease, status  post coronary bypassing graft, hypertension, hyperlipidemia and peripheral  vascular disease as well as cerebrovascular disease.  Since I last saw him,  he is doing well symptomatically with no dyspnea, chest pain, palpitations  or syncope.  He continues to occasionally smoke.  He is exercising by his  report as well as following the diet.   MEDICATIONS:  1. Coumadin as directed.  2. Aspirin 81 mg p.o. daily.  3. Imdur 60 mg p.o. daily.  4. Toprol 25 mg p.o. daily.  5. Cozaar 100 mg p.o. daily.  6. Norvasc 10 mg p.o. daily.  7. Zetia 10 mg p.o. daily.  8. Lipitor 80 mg p.o. daily.  9. Hydrochlorothiazide 12.5 mg p.o. daily.   PHYSICAL EXAMINATION:  Shows a blood pressure of 174/90, his pulse is 66.  He weighs 193 pounds.  NECK:  Supple with bilateral bruits.  He is status post bilateral carotid  endarterectomy.  CHEST:  Clear.  CARDIOVASCULAR:  Exam reveals a regular rate and rhythm.  There is a 2/6  systolic ejection murmur at the left sternal border.  ABDOMEN:  Exam shows a soft bruit.  EXTREMITIES:  Show no edema.   His electrocardiogram shows a sinus rhythm with a first degree A-V block.  There is a left bundle branch block.   DIAGNOSES:  1. Coronary artery disease, status post coronary bypassing graft.  2. History of ischemic cardiomyopathy, improved by most recent      echocardiogram.  3. History of peripheral vascular disease, status post aortofemoral bypass      as well as renal bypass.  4. History of cerebrovascular accident, status post bilateral carotid      endarterectomies.  5. Hypertension.  6. Hyperlipidemia.  7. Coumadin therapy.  8. Tobacco  abuse.   PLAN:  Clinton [name] is doing well from a symptomatic standpoint.  However,  his blood pressure continues to be elevated.  We will add hydralazine 25 mg  p.o. t.i.d., and increase this as indicated.  It has been 4 years since his  most recent stress test, and we will schedule him for an adenosine Myoview  for risk stratification.  Also note we will check a CBC, CMET and lipids  when he returns.  We discussed risk factor modification including  discontinuing his tobacco use and diet and exercise.  Note I asked him back  in December to follow up with the vascular surgeons concerning his previous  cerebrovascular and peripheral vascular disease.  He has not done this, but  he states that he will make an appointment.  We will see him back in 9  months.  Clinton Frieze Jens Som, MD, [facility]    BSC/MedQ  DD:  03/14/2006  DT:  03/15/2006  Job #:  [id number]

## 2010-11-12 NOTE — Op Note (Signed)
NAME:  CORDIE, BUENING NO.:  [id number]   MEDICAL RECORD NO.:  [id number]                   PATIENT TYPE:  OIB   LOCATION:  6527                                 FACILITY:  [facility]   PHYSICIAN:  Salvadore Farber, M.D. [facility]         DATE OF BIRTH:  10/10/1940   DATE OF PROCEDURE:  06/12/2002  DATE OF DISCHARGE:                                 OPERATIVE REPORT   PROCEDURE:  Abdominal aortography with distal runoff, selective bilateral  renal angiography.   INDICATIONS:  Mr. Italiano is a 70 year old gentleman with coronary artery  disease, status post coronary artery bypass grafting, who presents with  poorly controlled hypertension, despite four medications, normal renal  function, left kidney smaller than the right, and substantial bilateral  lower extremity claudication.  Abdominal aortography performed at the time  of cardiac catheterization and on October 28 of this year suggested  bilateral renal artery stenosis and severe disease of both iliacs.  Subsequent noninvasive evaluation has included duplex ultrasound of the  renal arteries which demonstrates increased velocities bilaterally.  Lower  extremity duplex evaluation is remarkable for an ABI of 0.44 on the right  and 0.53 on the left.  There are damp CFA wave forms bilaterally, suggestive  of proximal disease.  There are increased SFA velocities on the right,  consistent with greater than 50% stenosis.  Based on his symptoms and  noninvasive evaluation, he was referred for diagnostic angiography with  possible percutaneous intervention on the renal arteries and iliacs.   DIAGNOSTIC TECHNIQUE:  Informed consent was obtained.  Under 1% local  anesthesia, a 5 French sheath was placed in the left femoral artery using  the modified Seldinger technique.  I was unable to advance a wooly or an  angled glide wire through the ostial stenosis of the left common iliac.  Injection via the catheter placed in  the proximal left common iliac  demonstrated antegrade flow in the right common iliac.  Therefore, access  was obtained in the right common femoral artery with a 5 French sheath  placed using the modified Seldinger technique.  4000 units of heparin were  administered intravenously. A wooly wire was advanced into the thoracic  aorta without difficulty.  A 5 French pigtail catheter was advanced into the  suprarenal abdominal aorta.  Abdominal aortography was performed by power  injection.  The catheter was then pulled back over a wire into the distal  abdominal aorta.  Abdominal aortography with distal runoff was performed by  power injection.  This catheter was then removed over a wire.  A diagnostic  5 French LIMA catheter was then used to selectively bleeding gage the single  renal artery on the right and both renal arteries on the left.  The  catheters were removed over a wire.  The sheaths are to be removed when the  ACT is less than 175 seconds.   COMPLICATIONS:  None.   DIAGNOSTIC  FINDINGS:  1. Aorta:  Diffusely diseased abdominal aorta with severe disease of the     distal aorta.  2. SMA: Widely patent. It collateralizes the IMA.  3. Renal arteries:  There is a single right and two left renal arteries.     The right renal artery has a 70-80% stenosis of its ostium, associated     with an 80 mm mercury gradient when measured using a 5 French catheter.     The upper left renal artery has an 80% ostial stenosis.  The lower left     renal artery has approximately 30-50% ostial stenosis.  The remainder of     all renal vessels is essentially normal.  4. Left leg:  There is a 90% stenosis of the ostium of the left common iliac     artery.  The internal iliac is patent but diseased.  The left common and     external iliac is diffusely diseased.  There is approximately 40%     stenosis of the proximal SFA.  The profunda is widely patent.  There are     serial 30 and 40% stenoses of the  popliteal.  The anterior tibial is     occluded proximally.  There is two-vessel runoff to the left foot.  5. Right leg:  There is at least 90% stenosis of the ostium of the right     common iliac.  The right common and external are diffusely and severely     diseased.  The right internal iliac is patent.  The profunda and SFA are     without significant disease.  There is three-vessel runoff to the right     foot.   IMPRESSION/RECOMMENDATIONS:  1. Severe bilateral renal artery stenosis.  2. Severe bilateral iliac disease.   Given the extensive nature of his bilateral iliac disease, I think it would  be best served by aortobifemoral bypass grafting.  The distal vessels are  reasonable targets.  After discussion with Dr. Madilyn Fireman, the plan will be for  combined surgical revascularization of the iliacs and of the renal arteries.  I will discuss with Dr. Jens Som the need for further cardiac risk  stratification and/or revascularization prior to this peripheral vascular  procedure.                                               Salvadore Farber, M.D. [facility]    WED/MEDQ  D:  06/12/2002  T:  06/13/2002  Job:  [id number]   cc:   Balinda Quails, M.D.  [address]  [place]  [place] [address]  Fax: [phone]   Olga Millers, M.D. [facility]   Veneda Melter, M.D. [facility]

## 2010-11-12 NOTE — Consult Note (Signed)
NAME:  Clinton [name], Clinton [name].                      ACCOUNT NO.:  [id number]   MEDICAL RECORD NO.:  [id number]                   PATIENT TYPE:  OIB   LOCATION:                                       FACILITY:  [facility]   PHYSICIAN:  Mark E. Karin Golden, M.D.                DATE OF BIRTH:  07-20-1940   DATE OF CONSULTATION:  12/19/2002  DATE OF DISCHARGE:                                   CONSULTATION   This is a consultation for interpretation of intracranial views of a  cerebral arteriogram done on December 19, 2002 by Dr. Madilyn Fireman.  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   RIGHT INTERNAL CAROTID ARTERY ARTERIOGRAM:  This vessel is opacified via a  common carotid artery injection.  There is no siphon stenosis.  Supply is  given to the anterior, middle and posterior cerebral arteries on the right.  There is no proximal stenosis, aneurysm or vascular malformation.  The  parenchymal and venous phases are normal.   LEFT INTERNAL CAROTID ARTERY ARTERIOGRAM:  This vessel is opacified via a  common carotid artery injection.  There is no siphon stenosis.  Supply is  given to the anterior, middle, and posterior cerebral arteries.  There is no  proximal stenosis, aneurysm, or vascular malformation.  The venous and  parenchymal phases are normal.   IMPRESSION:  Normal intracranial anterior circulation.                                               Mark E. Karin Golden, M.D.    MES/MEDQ  D:  01/21/2003  T:  01/22/2003  Job:  [id number]

## 2010-11-12 NOTE — Op Note (Signed)
NAME:  Clinton [name], Clinton [name] NO.:  [id number]   MEDICAL RECORD NO.:  [id number]                   PATIENT TYPE:  INP   LOCATION:  2314                                 FACILITY:  [facility]   PHYSICIAN:  Balinda Quails, M.D.                 DATE OF BIRTH:  02-Feb-1941   DATE OF PROCEDURE:  08/14/2002  DATE OF DISCHARGE:                                 OPERATIVE REPORT   PREOPERATIVE DIAGNOSES:  1. Aortoiliac occlusive disease.  2. Bilateral renal artery stenosis.   POSTOPERATIVE DIAGNOSES:  1. Aortoiliac occlusive disease.  2. Bilateral renal artery stenosis.   PROCEDURES:  1. Aortobifemoral bypass with 16 x 8 bifurcated Hemashield Dacron graft.  2. Right aortorenal bypass with 6 mm Dacron graft.   SURGEON:  Balinda Quails, M.D.   ASSISTANT:  Coral Ceo, P.A.   ANESTHESIA:  General endotracheal.   ANESTHESIOLOGIST:  Guadalupe Maple, M.D.   CLINICAL NOTE:  The patient is a 70 year old male with a history of renal  artery stenosis, hypertension, and lower extremity claudication.  He has  undergone arteriography revealing extensive aortoiliac occlusive disease,  single right renal artery with high-grade stenosis, and two left renal  arteries with moderate stenosis.   He is brought to the operating room at this time for aortobifemoral bypass  and renal revascularization.   DESCRIPTION OF PROCEDURE:  Patient brought to the operating room in stable  condition.  Placed in the supine position.  General endotracheal anesthesia  induced.   Foley catheter, arterial line, Swan-Ganz catheter in place.  In the supine  position the abdomen and both legs were prepped and draped in a sterile  fashion.   Longitudinal skin incision made from xiphoid to umbilicus.  Subcutaneous  tissue divided with electrocautery.  Linea alba incised.  Peritoneal cavity  entered.   Full laparotomy evaluation carried out.  Stomach and duodenum were normal.  Liver, gallbladder,  bile ducts, pancreas normal.  Spleen was unremarkable.  The small bowel was normal.  Large bowel revealed no masses.   The transverse colon was brought superiorly.  The retroperitoneum incised  over the infrarenal artery.  Dissection carried proximally up to the left  renal vein.  The inferior mesenteric vein was mobilized and retracted  superiorly.  The juxtarenal aorta was cleared.  The origin of the renal  arteries bilaterally was clearly identified.  Inspection of the right renal  artery revealed a large single renal artery with a significant plaque at its  origin and easily palpable thrill.  The evaluation of the left renal  arteries revealed a small inferior pole left renal artery which appeared  widely patent.  The superior pole left renal artery had a moderate amount of  plaque but appeared to have excellent flow.   The juxtarenal artery was cleared adequately for clamp placement.  The right  renal artery was dissected out to the margin of  the inferior vena cava.  The  left renal arteries were not dissected for revascularization, they appeared  to have moderate stenosis at most.   Distal dissection then carried along the infrarenal aorta to the level of  the inferior mesenteric artery takeoff.  The infrarenal aorta cleared to  this level.  The anterior surface of the infrarenal aorta was then exposed  down to the aortic bifurcation.   Bilateral longitudinal groin incisions were then made.  Dissection carried  down through the subcutaneous tissue and lymphatics with electrocautery.  The common femoral artery exposed bilaterally, freed, then encircled with a  vessel loop.  Distal dissection carried down to expose the origin of the  profunda and superficial femoral arteries, which were also encircled with  vessel loops.  The common femoral arteries bilaterally were generally soft  with depressed pulses.   Retroperitoneal tunnels were then created from the aortic bifurcation to   each groin.   The patient administered 25 g of mannitol intravenously, 6000 units of  heparin intravenously.  Adequate circulation time permitted.  The infrarenal  aorta controlled with an aortic DeBakey clamp.  The distal aorta controlled  also with an aortic DeBakey clamp.  The aorta divided just beyond the origin  of the renal arteries.  The distal stump was beveled and oversewn with a  running 3-0 Prolene suture in a double layer.  The distal clamp then  removed.  The proximal aorta was then endarterectomized.  A 16 x 8  bifurcated Hemashield graft was anastomosed end-to-end to the infrarenal  aorta with a running 4-0 Prolene suture.  At completion of the proximal  anastomosis, the graft was flushed and each limb of the graft controlled  with a Fogarty clamp.   Attention then placed on the groins.  Each limb of the graft was tunneled to  the respective groin.  The femoral vessels controlled with clamps.  A  longitudinal arteriotomy made in the common femoral artery bilaterally.  Each limb of the graft was beveled and anastomosed end-to-side to the  respective common femoral artery with running 5-0 Prolene suture.  At  completion of each femoral anastomosis the graft was flushed, clamps were  removed, reperfusing each leg.  There were no apparent complications.   Attention then placed on the right renal artery.  A partial occlusion clamp  was placed on the body of the aortic graft.  This was opened with an 11  blade and a 5 punch.  A 6 mm Dacron graft was anastomosed end-to-side to the  aortic graft with running 6-0 Prolene suture.  The partial occlusion clamp  removed.  The graft flushed and controlled with a Fogarty clamp.  The right  renal artery was then ligated at its origin with an 0 silk tie.  This was  divided transversely and controlled distally with a clamp.  The 6 mm graft  was divided and anastomosed end-to-end to the right renal artery with running 6-0 Prolene suture.   At completion of the renal anastomosis, the  graft was flushed and clamps were removed, reperfusing the right kidney.  The right kidney ischemia time 20 minutes.   The patient administered 50 mg of protamine intravenously.  Adequate  hemostasis obtained.  Sponge, needle, and instrument counts correct.   The retroperitoneum was closed over the graft with running 3-0 Vicryl  suture.  The abdomen examined to assure there were no retained instruments  or sponges.  The midline fascia closed with running #1 PDS suture.  Skin  closed with staples.   The groin incisions irrigated with saline solution.  The subcutaneous tissue  closed in the groin incisions with running 2-0 Vicryl suture in two layers.  Staples applied to the skin.  Sterile dressings applied.   The patient tolerated the procedure well.  He was transferred directly to  the surgical intensive care unit in stable condition.  No apparent  intraoperative complications.                                               Balinda Quails, M.D.    PGH/MEDQ  D:  08/14/2002  T:  08/15/2002  Job:  [id number]   cc:   Salvadore Farber, M.D. [facility]   Gabriel Earing, M.D.  [address]  [place]  [place] [address]  Fax: [phone]

## 2010-11-12 NOTE — Discharge Summary (Signed)
NAME:  Clinton [name], Clinton [name] NO.:  [id number]   MEDICAL RECORD NO.:  [id number]                   PATIENT TYPE:  INP   LOCATION:  2001                                 FACILITY:  [facility]   PHYSICIAN:  Balinda Quails, M.D.                 DATE OF BIRTH:  [birth year]   DATE OF ADMISSION:  08/14/2002  DATE OF DISCHARGE:  08/19/2002                                 DISCHARGE SUMMARY   PRIMARY ADMISSION DIAGNOSES:  1. Aortoiliac occlusive disease.  2. Bilateral renal artery stenoses.   DISCHARGE DIAGNOSES:  1. Aortoiliac occlusive disease.  2. Bilateral renal artery stenoses.  3. History of coronary artery disease status post coronary artery bypass     grafting in 1985.  4. Hypertension.  5. History of peripheral vascular disease.  6. Hyperlipidemia.  7. History of prior cerebrovascular accident.  8. History of tobacco abuse.  9. Gastroesophageal reflux.   PROCEDURES:  1. Aortobifemoral bypass with 16 x 8 bifurcating Hemashield Dacron graft.  2. Right-aorta-to-renal bypass with 6 mm Dacron graft.   HISTORY:  The patient is a 70 year old white male who was referred to Dr.  Bud Face for evaluation of aortoiliac occlusive disease.  He has a  history of poorly controlled hypertension and renal artery stenosis as well  as some lower extremity claudication symptoms.  He had an arteriogram  performed which showed severe infrarenal aortic disease with a widely patent  superior mesenteric artery which collateralizes to the inferior mesenteric  artery.  He has a 70 to 80% stenosis of the right renal artery with an 80%  stenosis of the left renal artery.  He also had 90% ostial common iliac  stenosis on the right with diffuse disease of the right common and external  iliac arteries.  There was also high-grade stenosis of the left common iliac  ostial origin and diffuse disease of the left common and external iliac  arteries.   Because of his current symptoms and  extensive disease, it was felt that he  would benefit from surgical revascularization at this time.   HOSPITAL COURSE:  He was admitted on 08/14/2002 and was taken to the  operating room where he underwent an aortobifemoral bypass as well as a  right renal artery bypass.  Intraoperatively, the left renal artery was  examined, and the left inferior pole renal artery was apparently widely  patent.  The superior pole left renal artery had a moderate amount of plaque  but also appeared to have good flow.  It was decided at that time not to  bypass the left renal artery at this time.  He tolerated the procedure well  and was transferred to the SICU in stable condition.   He was hemodynamically stable, extubated, and doing well on postop day #1.  At that time, his NG tube was removed, and he was able to be mobilized.   By postop  day #2, he was ready for transfer to the floor.  Postoperatively,  he has done well.  His GI function has slowly returned.   On postop day #3, he was started on clear liquid diet which he tolerated  well.  He was able to return to regular bowel function, and by postop day #4  was advanced to a regular diet.  His surgical incision sites are healing  well.  His extremities have remained warm and well perfused with strong  pulses.  His urine output has been excellent.   Postoperative labs have remained stable with hemoglobin 10, hematocrit 28.9.  Creatinine 1.4, BUN 21.  He has been ambulating in the halls without  difficulty and has been weaned off supplemental oxygen, maintaining O2  saturation of greater than 90% on room air.  He has been afebrile, and vital  signs have been stable throughout his admission.  It is felt that, if he  continues to do well and remains stable, he will be ready for discharge home  on 08/19/2002.   DISCHARGE MEDICATIONS:  1. Enteric-coated aspirin 81 mg daily.  2. Cozaar 50 mg daily.  3. Imdur 30 mg daily.  4. Lipitor 80 mg daily.  5.  Norvasc 5 mg daily.  6. Temazepam 30 mg q.h.s.  7. Protonix 40 mg daily.  8. Toprol XL 25 mg daily.  9. Tylox 1 to 2 q.4h. p.r.n. pain.  10.      Coumadin 6 mg on Tuesday, Wednesday, Friday, Saturday, and Sunday;     and 3 mg on Monday and Thursday.   DISCHARGE ACTIVITIES:  1. He is to refrain from driving, heavy lifting, or strenuous activity.  2. He may continue daily walking and use of incentive spirometer.  3. He may continue same preoperative diet.  4. He is asked to shower daily and clean his incisions with soap and water.   FOLLOW UP:  He is asked to follow up with the [facility] who  regularly follows his Coumadin on Wednesday or Thursday of this week.  Until  that time, he will continue his regular home dose of Coumadin.  The  CV[facility] office will contact him with appointments for followup in one week for  staple removal and wound recheck and an appointment to see Dr. Madilyn Fireman in  three weeks.  He is asked to call if her experiences any problems or has any  questions in the interim.     Coral Ceo, P.A.                        Balinda Quails, M.D.    GC/MEDQ  D:  08/18/2002  T:  08/18/2002  Job:  [id number]   cc:   Salvadore Farber, M.D. [facility]   Gabriel Earing, M.D.  [address]  [place]  [place] [address]  Fax: [phone]   Veneda Melter, M.D. [facility]

## 2010-11-12 NOTE — Discharge Summary (Signed)
NAME:  Clinton [name], Clinton [name] NO.:  [id number]   MEDICAL RECORD NO.:  [id number]                   PATIENT TYPE:  INP   LOCATION:  3308                                 FACILITY:  [facility]   PHYSICIAN:  Balinda Quails, M.D.                 DATE OF BIRTH:  [birth year]   DATE OF ADMISSION:  12/27/2002  DATE OF DISCHARGE:  12/28/2002                                 DISCHARGE SUMMARY   ADMISSION DIAGNOSES:  1. Asymptomatic right internal carotid artery stenosis.  2. Status post left carotid endarterectomy after previous cerebrovascular     accident.  3. Status post coronary artery bypass grafting in 1985.  4. History of left cerebrovascular accident.  5. Hypertension.  6. Dyslipidemia.  7. Ongoing tobacco use.   DISCHARGE DIAGNOSES:  1. Asymptomatic right internal carotid artery stenosis.  2. Status post left carotid endarterectomy after previous cerebrovascular     accident.  3. Status post coronary artery bypass grafting in 1985.  4. History of left cerebrovascular accident.  5. Hypertension.  6. Dyslipidemia.  7. Ongoing tobacco use.   PROCEDURES:  Right carotid endarterectomy with Dacron patch angioplasty on  December 27, 2002 by Dr. Madilyn Fireman.   BRIEF HISTORY:  The patient is a 70 year old white male with aortoiliac  occlusive disease who underwent aortofemoral bypass grafting using a 16 x 8  Dacron graft and an aorta to renal bypass graft with a 6 mm Dacron graft on  August 14, 2002 by Dr. Madilyn Fireman.  Doppler studies have been ongoing since his  original left carotid endarterectomy in 1993.  He now shows increased  velocities on the right and subsequently underwent cerebral arteriograms.  Arteriograms reveal an 80% right ICA stenosis and a 40-50% recurrent left  ICA stenosis.  He is asymptomatic, was admitted for elective carotid  endarterectomy and Dacron patch angioplasty at this time.   PAST MEDICAL HISTORY:  Includes aortoiliac occlusive disease with  claudication.  He is status post coronary artery bypass grafting in 1985;  ejection fraction was 35% at that time.  He has a history of hypertension,  dyslipidemia, prior stroke which is resolved.  He had a right CVA with left-  sided weakness at that time.  He has a history of ongoing tobacco use.   SURGERY:  1. Left carotid endarterectomy on July 05, 1991.  2. Coronary artery bypass grafting with left internal mammary to the LAD,     saphenous vein graft to the intermediate and OM sequentially, right     internal mammary to the RCA on August 27, 1983.  3. He has also had three lumbar surgeries and a prior PTA in December 2003.   MEDICATIONS ON ADMISSION:  1. Coumadin 6 mg a day - last dose was taken on December 22, 2002.  2. Cozaar 50 mg daily.  3. Toprol-XL 25 mg daily.  4. Lipitor 80 mg p.o. h.s.  5.  Norvasc 5 mg daily.  6. Aspirin 81 mg daily.   ALLERGIES:  CODEINE causes nausea.   For further history and physical please see the dictated note.   HOSPITAL COURSE:  The patient was admitted, taken to the operating room, and  underwent right carotid endarterectomy with Dacron patch angioplasty by Dr.  Madilyn Fireman.  He tolerated the procedure well and returned to the recovery room in  satisfactory condition.  The first postoperative morning he was doing well,  neurologically intact.  He had some episodes of bradycardia with first  degree block in the past but postoperatively he had what appeared to be some  second degree or junctional block.  He was seen by Dr. Lewayne Bunting of  [place].  It was their opinion that this was probably related to his Toprol-  XL and carotid stimulation and inflammation from his surgery.  They  recommended holding his Toprol-XL for 48 hours and then resuming it.  He was  subsequently discharged home on his preadmission medicines as before, simply  holding his Toprol-XL for 48 hours.  He was neurologically intact.  His  heart rate improved and was up to 68 and he  was subsequently discharged  home.   LABORATORY DATA:  Discharge laboratory shows electrolytes to be normal, BUN  of 20, creatinine of 1.3, glucose of 118.  Hemoglobin was 11.5; hematocrit  34; white count was 12.4; and platelets were 243,000.   CONDITION ON DISCHARGE:  Improving.   He was scheduled to return to see Dr. Madilyn Fireman in two weeks, follow-up with Dr.  Ladona Ridgel and Dr. Jens Som at their instructions.      Eber Hong, P.A.                 Balinda Quails, M.D.    WDJ/MEDQ  D:  02/20/2003  T:  02/22/2003  Job:  [id number]   cc:   Olga Millers, M.D.   Salvadore Farber, M.D.

## 2010-11-12 NOTE — H&P (Signed)
NAME:  Clinton [name], Clinton [name] NO.:  [id number]   MEDICAL RECORD NO.:  [id number]                   PATIENT TYPE:  INP   LOCATION:  NA                                   FACILITY:  [facility]   PHYSICIAN:  Balinda Quails, M.D.                 DATE OF BIRTH:   DATE OF ADMISSION:  08/14/2002  DATE OF DISCHARGE:                                HISTORY & PHYSICAL   REFERRING PHYSICIANS:  1. Salvadore Farber, M.D., [facility].  2. Olga Millers, M.D. [facility]   CHIEF COMPLAINT:  Aortoiliac occlusive disease.   BRIEF HISTORY:  The patient is a 70 year old white male status post coronary  artery bypass grafting in 1985, and left carotid endarteriectomy in 1993 who  now presents with lower-extremity claudication and bilateral renal artery  stenosis.  The patient underwent arteriography which revealed diffuse  disease of the abdominal aorta with severe infrarenal aortic disease and  widely patent SMA which collateralizes to the IMA.  He has a single right  renal artery and two left renal arteries.  The right renal artery has a 70-  80% stenosis with an 80 mm gradient.  The left renal artery stenosis is 80%  with a lower pole renal artery stenosis of 30-50%.  There is a high grade  stenosis of left common iliac origin, and diffuse disease of the left common  external iliac arteries.  There is moderate infrainguinal disease in the  left leg.  The right leg reveals a 90% ostial common iliac stenosis, and  diffuse disease of the right common and external iliac arteries.  The  profunda and SFA were patent in the right lower extremity.  The patient  currently has claudication a couple hundred yards flat and one flight of  steps.  He has pain in both calves.  After arteriography and extensive  evaluation, it was Dr. Madilyn Fireman opinion the patient should undergo aortofemoral  bypass grafting and bilateral renal artery bypass grafting.  The risks and  benefits were discussed in detail, and  informed consent was obtained.   PAST MEDICAL HISTORY:  1. Coronary artery disease status post coronary artery bypass graft in 1985,     with an ejection fraction of 35%.  2. History of hypertension for three to four years.  3. History of peripheral vascular occlusive disease, dyslipidemia, history     of stroke which is resolved, history of tobacco use.   SURGICAL HISTORY:  Coronary artery bypass graft x5.  Left internal mammary  to the LAD, saphenous vein graft to the intermediate and OM.  Right internal  mammary to the RCA by Dr. Andrey Campanile treated 2/85.  Status post left carotid  endarterectomy.  He has had three lumbar surgeries, and PTA on 12/03 by Dr.  Joycelyn Rua.   MEDICATIONS:  1. Aspirin 81 mg every day.  2. Cozaar 50 mg every day.  3. Imdur 30 mg every  day.  4. Lipitor 80 mg every day.  5. Norvasc 5 mg every day.  6. Restoril 30 mg h.s.  7. Protonix 40 mg every day.  8. Toprol XL, 25 mg every day.  9. Coumadin 6 mg daily except for Thursday and Monday at which time he takes     3 mg.   ALLERGIES:  Codeine causes a rash or causes nausea.   REVIEW OF SYSTEMS:  No history of diabetes, kidney disease, asthma.  Positive history of COPD, and TIA.  He has coronary artery disease, and it  sounds like he has arrhythmias with atrial fibrillation although I do not  have any documentation for that.  He does have a history of MI back in 1985  when he originally presented.  He complains of trouble voiding and his  stream is slow.  He has occasional gastroesophageal reflux disease.  No  history of GI bleed.  No constipation or diarrhea.  He has a history of  hypertension.  He does not have any angina since December when he had his  PTA.  He has dyspnea on exertion, and he has some orthopnea.  He denies PND  or chronic productive cough.   FAMILY HISTORY:  His mother died with arthritis and coronary artery disease  at age 41.  His father died at age 23 of coronary artery disease.  He  has  two brothers deceased with coronary artery disease, ages 76 and 12.  One  sister who is in good health.   SOCIAL HISTORY:  He is divorced but lives with his son.  He has four  children.  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  He denies alcohol use.  He smokes one pack a day for  45 years, and quit in December of 2003.   PHYSICAL EXAMINATION:  GENERAL:  This is a well-nourished, well-developed,  slightly overweight white male, in no acute distress.  VITAL SIGNS:  Blood pressure 190/100 right arm and 208/94 in the left arm.  Pulse is 76.  Respirations are 18 and unlabored.  HEENT:  Normocephalic.  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  Left  eye was normal.  Ears, nose and throat is within normal limits.  NECK:  Supple, no JVD.  He has bilateral carotid bruits and a well-healed  surgical scar on the left.  No thyromegaly or lymphadenopathy.  CHEST:  Clear to auscultation and percussion bilaterally.  There is no  palpable lymphadenopathy.  Respirations were nonlabored.  CARDIAC:  He has a 2/6 systolic murmur best heard at the left sternal  border.  In the aortic area, he has an S3 gallop.  His rhythm at this time  was regular with an occasional extra beat.  ABDOMEN:  Soft, nontender, positive bowel sounds.  No palpable hepatomegaly.  He has abdominal bruits bilateral groin and midline bruits.  GU/RECTAL:  Deferred.  EXTREMITIES:  No cyanosis, clubbing or edema.  PULSES:  The pulses are +2 in the carotid, radial and brachial, femorals are  +1, popliteal, DP and PT are +1 bilaterally.  NEUROLOGIC:  Grossly within normal limits.  No focal deficits.   IMPRESSION:  1. Aortoiliac occlusive disease with bilateral claudication.  2. Bilateral renal artery stenosis.  3. Coronary disease, status post myocardial infarction, status post coronary    artery bypass  graft in 1985, and status post percutaneous transluminal     coronary angioplasty 12/03, probably chronic atrial fibrillation, and     ejection fraction of 35%.  4. Status post left carotid endarterectomy 1/93.  5. Hypertension.  6. History of transient ischemic attack, cerebrovascular accident.  7.     History of tobacco use.  8. Blind right eye.   PLAN:  The patient is admitted at this time for aortofemoral and bilateral  renal bypass grafting in the a.m.     Eber Hong, P.A.                 Balinda Quails, M.D.    WDJ/MEDQ  D:  08/12/2002  T:  08/12/2002  Job:  [id number]

## 2010-11-12 NOTE — Consult Note (Signed)
NAME:  Clinton [name], Clinton [name] NO.:  [id number]   MEDICAL RECORD NO.:  [id number]                   PATIENT TYPE:  INP   LOCATION:  3308                                 FACILITY:  [facility]   PHYSICIAN:  Doylene Canning. Ladona Ridgel, M.D.               DATE OF BIRTH:  19-Jun-1941   DATE OF CONSULTATION:  12/28/2002  DATE OF DISCHARGE:  12/28/2002                                   CONSULTATION   The consultation is requested by Dr. Liliane Bade for evaluation of  bradycardia, status post carotid endarterectomy.   HISTORY OF PRESENT ILLNESS:  The patient is a 70 year old man with coronary  artery disease, status post bypass surgery, as well as a history of  peripheral vascular disease.  He has a history of hypertension and  hyperlipidemia.  He has a history of MI with EF of 35-40%.  He has a left  bundle branch block at baseline.  He underwent carotid endarterectomy  yesterday, and postoperatively the patient had episodes of bradycardia with  heart rates in the 40s.  Review of his telemetry strip demonstrates what  appears to be sinus bradycardia with intermittent junctional escape rhythm.  He was asymptomatic with this.  The patient denies any history of syncope.  He denies palpitations.   PAST MEDICAL HISTORY:  Notable for:  1. Peripheral vascular disease, status post aortobifem in February 2004,     with right aorta to renal bypass grafting at that time.  2. Has a history of right carotid stenosis.  3. Status post left carotid endarterectomy in the past.  4. History of gastroesophageal reflux disease.  5. He had bypass surgery in 1985.   SOCIAL HISTORY:  The patient has a history of tobacco abuse.  He denies  alcohol use.  He is disabled.   FAMILY HISTORY:  Notable for coronary disease.   MEDICINES:  Cozaar, Lipitor, Coumadin, aspirin, and Toprol, which was  recently held.  In addition, he has taken Norvasc and Imdur.   REVIEW OF SYSTEMS:  Notable for no fevers,  chills, or night sweats.  He  denies any headache, vision or hearing problems.  There is no skin rash, no  skin changes.  There is no chest pain, shortness of breath, dyspnea, or  orthopnea.  He denied dysuria, hematuria, nocturia.  He denies weakness or  numbness.  He denies nausea, vomiting, diarrhea, or constipation.  The rest  of his review of systems was all negative.   PHYSICAL EXAMINATION:  GENERAL:  Pleasant, well-appearing 70 year old man in  no distress.  VITAL SIGNS:  Blood pressure was 170/50, the pulse was 65-75, the  respirations were 18, temperature was 98.  HEENT:  Normocephalic, atraumatic.  The pupils are equal and round.  The  oropharynx is moist.  The sclerae were anicteric.  NECK:  Revealed right carotid endarterectomy scar.  He has bilateral bruits.  CARDIOVASCULAR:  Revealed a regular rate  and rhythm with normal S1 and S2.  LUNGS:  Clear bilaterally to auscultation.  SKIN:  Revealed no rashes or lesions.  ABDOMEN:  Soft, nontender, nondistended.  There was no organomegaly.  EXTREMITIES:  Demonstrate no clubbing, cyanosis, or edema.  NEUROLOGIC:  Alert and oriented x3, with cranial nerves II-XII grossly  intact.   LABORATORY DATA:  EKG demonstrates sinus bradycardia with left bundle branch  block.   IMPRESSION:  1. Carotid vascular disease, status post right carotid endarterectomy.  2. Postoperative bradycardia.  3. Chronic left bundle branch block.  4. Ischemic heart disease, status post myocardial infarction with an     ejection fraction of 35-40% with underlying left bundle branch block.   DISCUSSION:  Clinton [name] bradycardia is probably multifactorial, but I  suspect his sinus bradycardia is as much as anything related to his  combination beta blocker therapy in the setting of right carotid  endarterectomy resulting in carotid sinus stimulation from inflammation from  his surgery.  I recommend that Toprol be held for the next two days and then  be  restarted at half dose as an outpatient.  His dose can be titrated  upwards after that.                                               Doylene Canning. Ladona Ridgel, M.D.    GWT/MEDQ  D:  12/28/2002  T:  12/30/2002  Job:  [id number]   cc:   Olga Millers, M.D.

## 2010-11-12 NOTE — H&P (Signed)
NAME:  Clinton [name], Clinton [name].                      ACCOUNT NO.:  [id number]   MEDICAL RECORD NO.:  [id number]                   PATIENT TYPE:  INP   LOCATION:                                       FACILITY:  [facility]   PHYSICIAN:  Balinda Quails, M.D.                 DATE OF BIRTH:  Oct 27, 1940   DATE OF ADMISSION:  12/27/2002  DATE OF DISCHARGE:                                HISTORY & PHYSICAL   REFERRING PHYSICIAN:  Salvadore Farber, M.D., [facility].   CHIEF COMPLAINT:  Severe right ICA stenosis.   BRIEF HISTORY:  The patient is a 70 year old white male with aortoiliac  occlusive disease who underwent an aortofemoral bypass grafting with a 16 x  8 mm Dacron graft in the right aorta to renal bypass graft with 6 mm Dacron,  August 14, 2002, by Dr. Madilyn Fireman.  Doppler studies have been ongoing since  his original left carotid endarterectomy in 1993.  He now shows increased  velocities on the right and subsequently he underwent cerebral arteriograms.  Cerebral arteriograms underwent an 80% right ICA stenosis and a 40-50%  recurrent left ICA stenosis.  The patient was asymptomatic at this time.  He  is admitted now for right carotid endarterectomy and Dacron patch  angioplasty in the a.m.   PAST MEDICAL HISTORY:  Aortoiliac occlusive disease with claudication.  Status post coronary artery bypass grafting in 1985.  Ejection fraction was  35%.  He has a history of hypertension, history of dyslipidemia, history of  prior stroke, resolved.  He had a right CVA with left-sided weakness at that  time.  History of tobacco use, ongoing.   SURGERIES:  1. Left carotid endarterectomy, July 05, 1991.  2. Coronary artery bypass grafting, left internal mammary to the LAD,     saphenous vein graft to the intermediate and obtuse marginal     sequentially, right internal mammary to the RCA August 27, 1983 at [facility].  He     has had three lumbar surgeries in the past and PTA December 2003.   CURRENT  MEDICATIONS:  1. Coumadin 6 mg a day, the last dose was taken on December 22, 2002.  2. Cozaar 50 mg daily.  3. Toprol XL 25 mg daily.  4. Lipitor 80 mg h.s.  5. Norvasc 5 mg daily.  6. Aspirin 81 mg daily.   ALLERGIES:  CODEINE causes NAUSEA.   REVIEW OF SYSTEMS:  No history of thyroid disease.  His weight is stable.  No history of diabetes, kidney disease.  No history of asthma.  No history  of COPD.  He has had history of TIA/CVA, resolved.  No history of angina or  arrhythmias.  No history of myocardial infarction.  No history of pulmonary  embolus or DVT.  No history of GI bleed.  No dysuria.  No hematuria.  No  gastroesophageal reflux disease.  No congestive heart  failure.  He does have  a history of hypertension.  No shortness of breath.  He does have some  dyspnea on exertion.  He denies PND, orthopnea.  He does not have  obstructive cough, or regular dry cough.   FAMILY HISTORY:  Mother died at age 33 with coronary artery disease and  father died at age 77 with coronary artery disease.  He has two brothers  deceased with MIs at ages 28 and 9.   SOCIAL HISTORY:  The patient is divorced, lives with his son.  He has four  children.  He works as a Chief Technology Officer.  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  He  quit using tobacco regularly in December 2003, but still smokes about a  quarter of a pack a day.   PHYSICAL EXAMINATION:  GENERAL:  This is a well-nourished, well-developed  white male in no acute distress.  VITAL SIGNS:  Stable with blood pressure 120/80 in the right arm and 160/86  in the left arm.  The pulse was 78 and regular.  Respirations were 18 and  unlabored.  HEENT:  Head normocephalic.  Eyes, PERRLA, EOMs intact, fundi normal.  Ears,  nose, throat, and mouth grossly within normal limits.  He has dentures in  the upper and lower arch.  NECK:  Supple, no JVD.  He has bilateral bruits.  The right is louder than  the left.  He has a well-healed left carotid endarterectomy  scar.  No  thyromegaly, no lymphadenopathy.  CHEST:  Symmetrical.  No wheezes, rales, or rhonchi.  No palpable  lymphadenopathy.  There is a well-healed mediastinal scar.  CARDIAC:  Regular rate and rhythm.  He has 2/6 systolic murmur.  No rubs or  gallops.  ABDOMEN:  Soft, nontender, positive bowel sounds.  No hepatosplenomegaly.  He has a well-healing midline and femoral scars.  He has bilateral femoral  bruits.  He does not have an abdominal bruit.  GUR:  Deferred.  EXTREMITIES:  No clubbing, cyanosis, or edema.  Pulses are +2 in the  carotid, femoral.  Popliteals are +1 bilaterally.  DP and PT are +2  bilaterally.  Both femoral pulses have bruits.  NEUROLOGIC:  No focal  deficits.   IMPRESSION:  1. Asymptomatic right internal carotid artery stenosis.  2. Status post left carotid endarterectomy after cerebrovascular accident.  3. Status post coronary artery bypass graft in 1985.  4. History of left cerebrovascular accident with seizures.  5. Hypertension.  6. Dyslipidemia.  7. History of tobacco use, ongoing, but diminished.   PLAN:  The patient is admitted for right carotid endarterectomy.     Eber Hong, P.A.                 Balinda Quails, M.D.    WDJ/MEDQ  D:  12/25/2002  T:  12/25/2002  Job:  [id number]

## 2010-11-12 NOTE — H&P (Signed)
NAME:  Clinton [name], Clinton [name] NO.:  [id number]   MEDICAL RECORD NO.:  [id number]                   PATIENT TYPE:  INP   LOCATION:  4713                                 FACILITY:  [facility]   PHYSICIAN:  Olga Millers, MD [facility]              DATE OF BIRTH:  09/13/1940   DATE OF ADMISSION:  04/01/2002  DATE OF DISCHARGE:  04/03/2002                                HISTORY & PHYSICAL   DISCHARGE DIAGNOSES:  1. Epigastric pain.  2. Coronary artery disease, status pos bypass surgery in 1985 with     subsequent cardiac catheterization in the year 2000.  3. Dyslipidemia.  4. Ischemic cardiomyopathy, ejection fraction 30%.  5. Hypertension.  6. Hemorrhoids.  7. Status post left carotid endarterectomy.  8. Status post lumbar surgery by Dr. Channing Mutters x 2.  9. Continued tobacco abuse, 1 and 1/2 packs per day.   HOSPITAL COURSE:  The patient is a 70 year old male who has previously been  followed for cardiology issues by Dr. Roxan Hockey in [place]. On the day  of admission the patient was standing at the sink when he had onset of  epigastric pain. He took a single nitroglycerin which eased the pain. He  then went to [facility] where he got two nitroglycerins with negligible  result. He was subsequently sent to [facility] for further  evaluation.   The patient was seen and admitted by Dr. Olga Millers. Dr. Jens Som felt  that the pain was rather atypical for angina. He intended to rule the  patient out for myocardial infarction with serial enzymes and ask internal  medicine to look at the patient too.   The patient was  then seen by Dr. Rene Paci of [facility]  [facility]. She agreed that the pain seemed to be GI in origin. She planned to  follow up on labs that had been previously ordered.   The next day the patient began to complain of a band-like epigastric pain.  However, he stated it has now moved toward his back. Dr. Jens Som the  patient's  serial cardiac enzymes were negative. Amylase and lipase as well  as liver function tests were all normal. The preliminary reading of an  abdominal ultrasound showed  no acute process. He thought that the IV  nitroglycerin could be discontinued and raised the possibility of a  musculoskeletal origin to the pain. The patient was  then seen by Dr.  Felicity Coyer. She agreed that the pain began to sound more musculoskeletal in  nature and suggested a course of Vioxx.   On April 03, 2002, the patient was  again seen by Dr. Jens Som. The  patient's chest/back pain resolved. Preliminary results of a chest CT scan  showed no aortic dissection. He felt the patient was stable for discharge.   DISCHARGE MEDICATIONS:  1. Cozaar 50 mg q.d.  2. Toprol XL 25 mg q.d.  3. Enteric coated aspirin 81  mg q.d.  4. Lipitor 40 mg q.h.s.  5. Imdur 30 mg q.d.  6. Coumadin 6 mg q. p.m.  7. Vioxx 25 mg q.d. x 1 week.  8. Sublingual nitroglycerin p.r.n.   LABORATORY DATA:  Sodium 136, potassium 4.5, chloride 107, CO2 22, BUN 21,  creatinine 1.2, glucose 103. White count 8.7, hemoglobin 13.6, hematocrit  40.5, platelets 253. Amylase 70, total protein 7.0, albumin 2.2, AST 32, ALT  25, alkaline phosphatase 115, lipase 30. INR 1.8, total cholesterol 281,  triglycerides 208, HDL 44, LDL 195, total cholesterol with HDL ratio 6.4,  VLDL 42.   An electrocardiogram showed a sinus rhythm with a borderline first degree AV  block. There was also noted to be a left bundle branch block, PR interval  218, QRS 168, QTC 487, axis -20.   DISCHARGE INSTRUCTIONS:  The patient is to return to his normal level of  activity. He is to follow a low fat, low cholesterol diet.   FOLLOW UP:  He is to follow up in the Coumadin clinic on Friday, April 05, 2002, at 12:30 in the morning. He is to follow up with Dr. Jens Som on  May 03, 2002, at 2:30 in the afternoon. He is to have an adenosine rest  stress  Cardiolite exam the week  after discharge and the office will call  him with the times. He is to follow up with Dr. Andi Devon as needed or  scheduled. He is to follow up with Dr. Shon Millet as needed or  scheduled. He is to follow up with Dr. Channing Mutters as needed or scheduled.        Annett Fabian, P.A. [facility]                  Olga Millers, MD [facility]    CKM/MEDQ  D:  04/03/2002  T:  04/05/2002  Job:  [id number]   cc:   Gabriel Earing, MD  [address]  [place]  [place] [address]  Fax: [phone]   Alease Medina, M.D.  [facility]   Payton Doughty, M.D.   Shelbie Ammons, M.D.  [facility] [employer]

## 2010-11-12 NOTE — H&P (Signed)
NAME:  Clinton [name], Clinton [name] NO.:  [id number]   MEDICAL RECORD NO.:  [id number]                   PATIENT TYPE:  INP   LOCATION:  4713                                 FACILITY:  [facility]   PHYSICIAN:  Madolyn Frieze. Jens Som, M.D. [facility]         DATE OF BIRTH:  06/29/1940   DATE OF ADMISSION:  04/01/2002  DATE OF DISCHARGE:                                HISTORY & PHYSICAL   HISTORY OF PRESENT ILLNESS:  The patient is a 70 year old male with a past  medical history of coronary artery disease, status post coronary artery  bypass graft in 1985, hypertension, hyperlipidemia, and is status post left  carotid endarterectomy as well as a CVA, who we are asked to evaluate for  epigastric pain. The patient is status post coronary artery bypass graft in  1985. His most recent catheterization in February 2000 was performed at  [facility]. I do not have all of his records available, although he does state  that he had a patent RIMA to the PDA and a patent LIMA to the LAD. His  ejection fraction is in the 30% range with 2+ mitral regurgitation. The  patient typically has mild dyspnea with more extreme exertion, but denies  any orthopnea, PND or pedal edema. He also has mild chest tightness with  more extreme exertion that is relieved with rest and unchanged.   Today he developed epigastric pain which was described as a sharp sensation.  It was in a band like distribution. The pain was not associated with nausea  or vomiting, no diaphoresis, but there was shortness of breath. These  symptoms did increase with leaning forward at times. The pain was not  exertional, nor was it pleuritic. It was not related to food.   MEDICATIONS:  His medications at present include:  1. Cozaar 25 mg p.o. q.d.  2. Zocor 20 mg p.o. q.h.s.  3. Coumadin 60 mg p.o. q.d.  4. Atenolol 25 mg p.o. q.d.  5. Imdur 30 mg p.o. q.d.  6. Aspirin 325 mg p.o. q.d.  7. Norvasc 5 mg p.o. q.d.  8. Restoril  occasionally.   ALLERGIES:  CODEINE.   FAMILY HISTORY:  Negative for coronary artery disease.   SOCIAL HISTORY:  He does smoke but does not consume alcohol.   PAST MEDICAL HISTORY:  Significant for:  1. Hyperlipidemia.  2. Hypertension.  3. No diabetes mellitus.  4. History of coronary artery disease and is status post coronary artery     bypass graft.  5. History of left carotid endarterectomy.  6. History of hemorrhoids.  7. He has had prior lumbar surgery.   REVIEW OF SYSTEMS:  He denies any headaches, fever or chills. There is no  productive cough or hemoptysis. There is no dysphagia, odynophagia, melena  or hematochezia. There is no dysuria or hematuria. There is no rash or  seizure activity. He denies any orthopnea, PND or pedal edema. The remainder  of the  systems are negative.   PHYSICAL EXAMINATION:  His physical exam today shows:  VITAL SIGNS:  Blood pressure 157.73, pulse 59, afebrile.  GENERAL:  He is well developed, well nourished and in mild distress.  SKIN:  Warm and dry.  HEENT:  Unremarkable with normal eyelids.  NECK:  Supple. There are bilateral carotid bruits. He is status post left  carotid endarterectomy. There is no jugular venous  distention or  thyromegaly noted.  CHEST:  Clear to auscultation with normal expansion.  CARDIOVASCULAR:  Regular rate and rhythm. There is a 1 to 2/6 systolic  ejection murmur at the left sternal border.  ABDOMEN:  He is tender in the epigastric area, but there is no right upper  quadrant tenderness. There is no rebound or guarding. I can appreciate no  masses. There is an abdominal bruit.  EXTREMITIES:  He has 1+ femoral pulses bilaterally and there are bilateral  femoral bruits noted. His extremities showed no edema. I cannot palpate  distal pulses.  NEUROLOGIC:  Grossly intact.   LABORATORY DATA:  His electrocardiogram shows normal sinus rhythm with a  left bundle branch block of uncertain duration. His chest x-ray  shows  cardiac enlargement as well as status post coronary artery bypass graft.   His white blood cell count is 8.7 with a hemoglobin of 13.6 and hematocrit  of 40.5. BUN 21, creatinine 1.2. His initial enzymes are negative. INR 2.4.   DIAGNOSES:  1. Atypical epigastric pain.  2. Coronary artery disease, status post coronary artery bypass graft.  3. Ischemic cardiomyopathy.  4. Hypertension.  5. Hyperlipidemia.  6. Tobacco abuse.   PLAN:  The patient presents with epigastric pain of uncertain etiology. This  does not seem to be cardiac, and he describes it as different than  his  previous cardiac pain. There is tenderness in the mid epigastric area on  palpation. This may be musculoskeletal, as it is in the lower sternal area,  however, I will also check amylase as well as lipase and liver function  tests to rule out pancreatitis or liver disease. We will also check a right  upper quadrant ultrasound. We will cycle enzymes, and if they are negative,  I do not think we need to pursue further cardiac workup. We will continue  with his present medications, but I will discontinue his Norvasc and we will  increase his Cozaar to 50 mg p.o. q.d.  This can be increased further as an  outpatient given his LV dysfunction. We will also decrease his aspirin to 81  mg p.o. q.d.  given that he is on Coumadin. Finally I will ask internal  medicine to evaluate the patient to see if they  have any thoughts about the  etiology of this pain.                                                Madolyn Frieze Jens Som, M.D. [facility]   BSC/MEDQ  D:  04/01/2002  T:  04/03/2002  Job:  [id number]

## 2010-11-12 NOTE — Cardiovascular Report (Signed)
NAME:  Clinton [name], Clinton [name] NO.:  [id number]   MEDICAL RECORD NO.:  [id number]                   PATIENT TYPE:  INP   LOCATION:  6527                                 FACILITY:  [facility]   PHYSICIAN:  Salvadore Farber, M.D. [facility]         DATE OF BIRTH:  09-03-1940   DATE OF PROCEDURE:  06/14/2002  DATE OF DISCHARGE:                              CARDIAC CATHETERIZATION   PROCEDURE:  Rotablation and balloon angioplasty of the ramus intermedius and  circumflex with kissing balloon angioplasty of the ostia of both of these  vessels.   INDICATIONS FOR PROCEDURE:  The patient is a 70 year old gentleman with  known coronary artery disease.  Coronary catheterization October 28th of  this year demonstrated a widely patent LIMA to LAD, RIMA to distal RCA with  a 60-70% distal stenosis, and occluded vein graft to OM.  There is a 95%  stenosis of the ostium of the circumflex.  The plan was for a trial with  medical therapy; however, his claudication has become increasingly  troublesome.  He therefore underwent diagnostic vascular angiography  yesterday by myself.  This demonstrated severe bilateral diffuse iliac  disease.  Recommendation is for aortobifemoral bypass.  In light of the  large area of lateral ischemia on his exercise test and severe stenosis at  the ostium of the circumflex, he is referred for percutaneous intervention  of the circumflex ostium to minimize the chance of perioperative cardiac  complications.   PROCEDURAL TECHNIQUE:  Informed consent was obtained.  Under 1% lidocaine  local anesthesia, a #7 French sheath was placed in the right femoral artery  using the modified Seldinger technique.  A #6 French sheath was placed in  the right femoral vein using the modified Seldinger technique.  Anticoagulation was initiated with heparin and ReoPro to achieve an ACT of  greater than 200 seconds.  A #7 [place] CLS 3.5 guide was advanced over a  wire and  engaged in the left main coronary artery.  A RotoFloppy wire was  advanced into the distal portion of the circumflex.  Rotablation was  undertaken using a 1.5-mm bur at 145,000 rpm.  A total of two passes for a  total of 60 seconds were made.  Care was taken not to allow a deceleration  of greater than 5000 rpm.  This Rotablation resulted in marked improvement  in the lumen.  The ostium of the circumflex was then dilated using a 2.25 x  10-mm cutting balloon at 10 atmospheres for a total of two inflations.  Images thereafter demonstrated approximately 70% stenosis of the ostium of  the ramus intermedius which was likely preexisting; therefore, the  Rotablator wire was inserted into the ramus intermedius.  A single 20-second  pass at 145,000 was performed into the ramus intermedius.  This, too,  improved the lumen.  Care was again taken not to allow a deceleration of  more than 5000 rpm.  Next, kissing  balloon angioplasty of the circumflex and  ramus intermedius ostia was performed using 2.25 x 15-mm Maverick in the  circumflex and 2.25 x 15-mm CrossSail in the ramus intermedius.  These were  simultaneously inflated to 6 atmospheres for 45 seconds.  Angiograms  demonstrated approximately 30% residual stenosis in the ramus intermedius;  therefore, the CrossSail was reintroduced into this ostium and inflated  alone to 12 atmospheres for a total of 45 seconds.  Repeat angiograms  demonstrated approximately 20% stenosis of the ramus ostium and no residual  stenosis of the circumflex ostium.  There was TIMI-3 flow to both vessels.   COMPLICATIONS:  None.   IMPRESSION/RECOMMENDATIONS:  Successful rotational atherectomy and balloon  angioplasty of the ostia of both the circumflex and the ramus resulting in  no residual stenosis in the circumflex and less than 20% residual stenosis  in the ramus intermedius.  Will plan on continuing ReoPro for 12 hours.  Aspirin will be continued indefinitely.   Given the complex nature of the  intervention, I would encourage Plavix to be continued for four weeks  despite the fact that no stent was placed.  His planned aortobifemoral  bypass should be deferred a minimum of four weeks after this intervention to  allow endothelialization of the vessels and thus minimize the chance of  perioperative thrombosis.                                               Salvadore Farber, M.D. [facility]    WED/MEDQ  D:  06/14/2002  T:  06/14/2002  Job:  [id number]   cc:   Balinda Quails, M.D.  [address]  [place]  [place] [address]  Fax: [phone]

## 2010-11-25 ENCOUNTER — Ambulatory Visit (INDEPENDENT_AMBULATORY_CARE_PROVIDER_SITE_OTHER): Payer: Medicare Other | Admitting: *Deleted

## 2010-11-25 DIAGNOSIS — I059 Rheumatic mitral valve disease, unspecified: Secondary | ICD-10-CM

## 2010-11-25 DIAGNOSIS — I6350 Cerebral infarction due to unspecified occlusion or stenosis of unspecified cerebral artery: Secondary | ICD-10-CM

## 2010-11-25 DIAGNOSIS — I340 Nonrheumatic mitral (valve) insufficiency: Secondary | ICD-10-CM

## 2010-12-23 ENCOUNTER — Encounter: Payer: Medicare Other | Admitting: *Deleted

## 2010-12-27 ENCOUNTER — Ambulatory Visit (INDEPENDENT_AMBULATORY_CARE_PROVIDER_SITE_OTHER): Payer: Medicare Other | Admitting: *Deleted

## 2010-12-27 DIAGNOSIS — I6350 Cerebral infarction due to unspecified occlusion or stenosis of unspecified cerebral artery: Secondary | ICD-10-CM

## 2010-12-27 DIAGNOSIS — I059 Rheumatic mitral valve disease, unspecified: Secondary | ICD-10-CM

## 2010-12-27 DIAGNOSIS — I340 Nonrheumatic mitral (valve) insufficiency: Secondary | ICD-10-CM

## 2010-12-27 LAB — POCT INR: INR: 2.3

## 2011-01-24 ENCOUNTER — Ambulatory Visit (INDEPENDENT_AMBULATORY_CARE_PROVIDER_SITE_OTHER): Payer: Medicare Other | Admitting: *Deleted

## 2011-01-24 DIAGNOSIS — I340 Nonrheumatic mitral (valve) insufficiency: Secondary | ICD-10-CM

## 2011-01-24 DIAGNOSIS — I059 Rheumatic mitral valve disease, unspecified: Secondary | ICD-10-CM

## 2011-01-24 DIAGNOSIS — I6350 Cerebral infarction due to unspecified occlusion or stenosis of unspecified cerebral artery: Secondary | ICD-10-CM

## 2011-01-24 LAB — POCT INR: INR: 2.6

## 2011-01-25 ENCOUNTER — Other Ambulatory Visit: Payer: Self-pay | Admitting: Cardiology

## 2011-02-04 ENCOUNTER — Other Ambulatory Visit: Payer: Self-pay

## 2011-02-21 ENCOUNTER — Encounter: Payer: Medicare Other | Admitting: *Deleted

## 2011-03-03 ENCOUNTER — Encounter: Payer: Self-pay | Admitting: Vascular Surgery

## 2011-03-04 ENCOUNTER — Other Ambulatory Visit (INDEPENDENT_AMBULATORY_CARE_PROVIDER_SITE_OTHER): Payer: Medicare Other | Admitting: *Deleted

## 2011-03-04 ENCOUNTER — Ambulatory Visit (INDEPENDENT_AMBULATORY_CARE_PROVIDER_SITE_OTHER): Payer: Medicare Other | Admitting: *Deleted

## 2011-03-04 DIAGNOSIS — I6350 Cerebral infarction due to unspecified occlusion or stenosis of unspecified cerebral artery: Secondary | ICD-10-CM

## 2011-03-04 DIAGNOSIS — I059 Rheumatic mitral valve disease, unspecified: Secondary | ICD-10-CM

## 2011-03-04 DIAGNOSIS — I6529 Occlusion and stenosis of unspecified carotid artery: Secondary | ICD-10-CM

## 2011-03-04 DIAGNOSIS — I340 Nonrheumatic mitral (valve) insufficiency: Secondary | ICD-10-CM

## 2011-03-04 DIAGNOSIS — Z48812 Encounter for surgical aftercare following surgery on the circulatory system: Secondary | ICD-10-CM

## 2011-03-04 LAB — POCT INR: INR: 2.3

## 2011-03-11 ENCOUNTER — Encounter: Payer: Self-pay | Admitting: Vascular Surgery

## 2011-04-01 ENCOUNTER — Ambulatory Visit (INDEPENDENT_AMBULATORY_CARE_PROVIDER_SITE_OTHER): Payer: Medicare Other | Admitting: *Deleted

## 2011-04-01 DIAGNOSIS — I340 Nonrheumatic mitral (valve) insufficiency: Secondary | ICD-10-CM

## 2011-04-01 DIAGNOSIS — I059 Rheumatic mitral valve disease, unspecified: Secondary | ICD-10-CM

## 2011-04-01 DIAGNOSIS — I6350 Cerebral infarction due to unspecified occlusion or stenosis of unspecified cerebral artery: Secondary | ICD-10-CM

## 2011-04-01 LAB — APTT: aPTT: 51 — ABNORMAL HIGH

## 2011-04-01 LAB — URINALYSIS, ROUTINE W REFLEX MICROSCOPIC
Bilirubin Urine: NEGATIVE
Nitrite: NEGATIVE
Specific Gravity, Urine: 1.018
pH: 6.5

## 2011-04-01 LAB — CBC
MCHC: 33.7
MCHC: 33.9
MCV: 88
Platelets: 238
RBC: 3.91 — ABNORMAL LOW
RBC: 3.99 — ABNORMAL LOW
RDW: 15.1
RDW: 15.2
WBC: 10.5

## 2011-04-01 LAB — PROTIME-INR
INR: 1.3
INR: 1.4
Prothrombin Time: 16.8 — ABNORMAL HIGH

## 2011-04-01 LAB — COMPREHENSIVE METABOLIC PANEL
ALT: 23
AST: 24
Albumin: 3.7
Alkaline Phosphatase: 113
Chloride: 108
Creatinine, Ser: 1.37
GFR calc Af Amer: >60
Potassium: 4.3
Sodium: 139
Total Bilirubin: 0.8

## 2011-04-01 LAB — CARDIAC PANEL(CRET KIN+CKTOT+MB+TROPI)
CK, MB: 3.8
Total CK: 196
Troponin I: 0.08 — ABNORMAL HIGH

## 2011-04-01 LAB — POCT INR: INR: 2.2

## 2011-04-01 LAB — BASIC METABOLIC PANEL
CO2: 24
Calcium: 8.5
Calcium: 8.9
Chloride: 105
Creatinine, Ser: 1.11
Creatinine, Ser: 1.41
GFR calc Af Amer: >60
GFR calc Af Amer: >60
GFR calc non Af Amer: 50 — ABNORMAL LOW
Sodium: 136

## 2011-04-29 ENCOUNTER — Ambulatory Visit (INDEPENDENT_AMBULATORY_CARE_PROVIDER_SITE_OTHER): Payer: Medicare Other | Admitting: *Deleted

## 2011-04-29 DIAGNOSIS — I059 Rheumatic mitral valve disease, unspecified: Secondary | ICD-10-CM

## 2011-04-29 DIAGNOSIS — I340 Nonrheumatic mitral (valve) insufficiency: Secondary | ICD-10-CM

## 2011-04-29 DIAGNOSIS — Z7901 Long term (current) use of anticoagulants: Secondary | ICD-10-CM

## 2011-04-29 DIAGNOSIS — I6350 Cerebral infarction due to unspecified occlusion or stenosis of unspecified cerebral artery: Secondary | ICD-10-CM

## 2011-04-29 LAB — POCT INR: INR: 2.3

## 2011-05-25 ENCOUNTER — Other Ambulatory Visit: Payer: Self-pay | Admitting: Cardiology

## 2011-06-06 ENCOUNTER — Ambulatory Visit (INDEPENDENT_AMBULATORY_CARE_PROVIDER_SITE_OTHER): Payer: Medicare Other | Admitting: *Deleted

## 2011-06-06 DIAGNOSIS — I059 Rheumatic mitral valve disease, unspecified: Secondary | ICD-10-CM

## 2011-06-06 DIAGNOSIS — I340 Nonrheumatic mitral (valve) insufficiency: Secondary | ICD-10-CM

## 2011-06-06 DIAGNOSIS — I6350 Cerebral infarction due to unspecified occlusion or stenosis of unspecified cerebral artery: Secondary | ICD-10-CM

## 2011-06-06 DIAGNOSIS — Z7901 Long term (current) use of anticoagulants: Secondary | ICD-10-CM

## 2011-07-14 ENCOUNTER — Ambulatory Visit (INDEPENDENT_AMBULATORY_CARE_PROVIDER_SITE_OTHER): Payer: Medicare Other | Admitting: Cardiology

## 2011-07-14 ENCOUNTER — Encounter: Payer: Self-pay | Admitting: Cardiology

## 2011-07-14 ENCOUNTER — Ambulatory Visit (INDEPENDENT_AMBULATORY_CARE_PROVIDER_SITE_OTHER): Payer: Medicare Other | Admitting: *Deleted

## 2011-07-14 DIAGNOSIS — E785 Hyperlipidemia, unspecified: Secondary | ICD-10-CM

## 2011-07-14 DIAGNOSIS — Z7901 Long term (current) use of anticoagulants: Secondary | ICD-10-CM

## 2011-07-14 DIAGNOSIS — F17200 Nicotine dependence, unspecified, uncomplicated: Secondary | ICD-10-CM

## 2011-07-14 DIAGNOSIS — I25810 Atherosclerosis of coronary artery bypass graft(s) without angina pectoris: Secondary | ICD-10-CM

## 2011-07-14 DIAGNOSIS — I701 Atherosclerosis of renal artery: Secondary | ICD-10-CM

## 2011-07-14 DIAGNOSIS — I6529 Occlusion and stenosis of unspecified carotid artery: Secondary | ICD-10-CM

## 2011-07-14 DIAGNOSIS — I6350 Cerebral infarction due to unspecified occlusion or stenosis of unspecified cerebral artery: Secondary | ICD-10-CM

## 2011-07-14 DIAGNOSIS — I059 Rheumatic mitral valve disease, unspecified: Secondary | ICD-10-CM

## 2011-07-14 DIAGNOSIS — I2589 Other forms of chronic ischemic heart disease: Secondary | ICD-10-CM

## 2011-07-14 DIAGNOSIS — I10 Essential (primary) hypertension: Secondary | ICD-10-CM

## 2011-07-14 DIAGNOSIS — I340 Nonrheumatic mitral (valve) insufficiency: Secondary | ICD-10-CM

## 2011-07-14 LAB — POCT INR: INR: 2.1

## 2011-07-14 MED ORDER — METOPROLOL SUCCINATE ER 100 MG PO TB24
100.0000 mg | ORAL_TABLET | Freq: Every day | ORAL | Status: DC
Start: 2011-07-14 — End: 2013-06-05

## 2011-07-14 NOTE — Assessment & Plan Note (Signed)
Blood pressure controlled. Continue present medications. Check potassium and renal function. 

## 2011-07-14 NOTE — Assessment & Plan Note (Addendum)
Continue statin. Discontinue aspirin given Coumadin use. 

## 2011-07-14 NOTE — Assessment & Plan Note (Signed)
Continue ARB. Change metoprolol to Toprol 100 mg daily given reduced LV function. Patient declines ICD and understands risk of sudden death.

## 2011-07-14 NOTE — Assessment & Plan Note (Signed)
Patient counseled on discontinuing. 

## 2011-07-14 NOTE — Assessment & Plan Note (Signed)
Continue aspirin and statin. 

## 2011-07-14 NOTE — Assessment & Plan Note (Signed)
Continue statin. Check lipids and liver. 

## 2011-07-14 NOTE — Assessment & Plan Note (Signed)
On Coumadin for history of peripheral vascular disease started by vascular surgery. Check CBC. Discontinue aspirin.

## 2011-07-14 NOTE — Progress Notes (Signed)
HPI: [name] is a pleasant gentleman who has a history of coronary  disease status post coronary bypassing graft, peripheral vascular  disease, cerebrovascular disease, hypertension, hyperlipidemia. His last myoview was   performed in July of 2011. This revealed previous inferior and lateral wall infarct with minimal ischemia in the mid lateral wall and base of the anterior wall. The LV is dilated with EF 29%. Echocardiogram in November of 2011 showed an ejection fraction of 35-40% and moderate mitral regurgitation. His ejection fraction was felt to be borderline for ICD and MRI was recommended to more fully assess. This was performed in December of 2011 and revealed an ejection fraction of 30%. Renal Dopplers in November of 2011 showed patent aortobifemoral grafts and normal renal arteries. 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 Seen by Dr Johney Frame in Feb 2012 for ICD but patient declined. Since I last saw him in Jan 2012, the patient denies any dyspnea on exertion, orthopnea, PND, pedal edema, palpitations, syncope or chest pain.   Current Outpatient Prescriptions  Medication Sig Dispense Refill  . amLODipine (NORVASC) 10 MG tablet Take 10 mg by mouth daily.        Marland Kitchen aspirin 81 MG tablet Take 81 mg by mouth daily.        Marland Kitchen ezetimibe (ZETIA) 10 MG tablet Take 10 mg by mouth daily.        . hydrALAZINE (APRESOLINE) 50 MG tablet Take 100 mg by mouth 3 (three) times daily.       . hydrochlorothiazide 25 MG tablet Take 25 mg by mouth daily.        Marland Kitchen losartan (COZAAR) 100 MG tablet Take 100 mg by mouth daily.        . metoprolol (LOPRESSOR) 50 MG tablet Take 50 mg by mouth 2 (two) times daily.        . rosuvastatin (CRESTOR) 40 MG tablet Take 40 mg by mouth daily.        Marland Kitchen warfarin (COUMADIN) 6 MG tablet take 1 tablet by mouth as directed  35 tablet  3     Past Medical History  Diagnosis Date  . Peripheral arterial disease   . Carotid artery occlusion   . CAD (coronary artery disease)   .  Cardiomyopathy, ischemic   . Stroke     remote  . Hypertension   . Hyperlipidemia   . Substance abuse     tobacco    Past Surgical History  Procedure Date  . Carotid endarterectomy   . Coronary artery bypass graft   . Pr vein bypass graft,aorto-fem-pop   . Ureteral artery renal bypass     History   Social History  . Marital Status: Divorced    Spouse Name: N/A    Number of Children: N/A  . Years of Education: N/A   Occupational History  . Not on file.   Social History Main Topics  . Smoking status: Current Everyday Smoker    Types: Cigarettes  . Smokeless tobacco: Not on file  . Alcohol Use: No  . Drug Use: No  . Sexually Active: Not on file   Other Topics Concern  . Not on file   Social History Narrative  . No narrative on file    ROS: no fevers or chills, productive cough, hemoptysis, dysphasia, odynophagia, melena, hematochezia, dysuria, hematuria, rash, seizure activity, orthopnea, PND, pedal edema, claudication. Remaining systems are negative.  Physical Exam: Well-developed well-nourished in no acute distress.  Skin is warm and dry.  HEENT  is normal.  Neck is supple. No thyromegaly. Bilateral bruits Chest is clear to auscultation with normal expansion.  Cardiovascular exam is regular rate and rhythm. 2/6 systolic ejection murmur Abdominal exam nontender or distended. No masses palpated. Extremities show no edema. neuro grossly intact  ECG sinus rhythm at a rate of 89. Occasional PVC. Left bundle branch block. First degree AV block.

## 2011-07-14 NOTE — Assessment & Plan Note (Signed)
Followed by vascular surgery. 

## 2011-07-14 NOTE — Patient Instructions (Addendum)
Your physician wants you to follow-up in: ONE YEAR You will receive a reminder letter in the mail two months in advance. If you don't receive a letter, please call our office to schedule the follow-up appointment.   Your physician recommends that you return for lab work in: WHEN FASTING, WITH NEXT COUMADIN CHECK=DO NOT EAT  STOP ASPIRIN  CHANGE METOPROLOL TO SUCC 100 MG ONCE DAILY

## 2011-08-24 ENCOUNTER — Ambulatory Visit (INDEPENDENT_AMBULATORY_CARE_PROVIDER_SITE_OTHER): Payer: Medicare Other | Admitting: *Deleted

## 2011-08-24 ENCOUNTER — Other Ambulatory Visit (INDEPENDENT_AMBULATORY_CARE_PROVIDER_SITE_OTHER): Payer: Medicare Other

## 2011-08-24 ENCOUNTER — Other Ambulatory Visit: Payer: Self-pay | Admitting: Cardiology

## 2011-08-24 DIAGNOSIS — E785 Hyperlipidemia, unspecified: Secondary | ICD-10-CM

## 2011-08-24 DIAGNOSIS — I059 Rheumatic mitral valve disease, unspecified: Secondary | ICD-10-CM

## 2011-08-24 DIAGNOSIS — I10 Essential (primary) hypertension: Secondary | ICD-10-CM

## 2011-08-24 DIAGNOSIS — Z7901 Long term (current) use of anticoagulants: Secondary | ICD-10-CM

## 2011-08-24 DIAGNOSIS — I2589 Other forms of chronic ischemic heart disease: Secondary | ICD-10-CM

## 2011-08-24 DIAGNOSIS — F17200 Nicotine dependence, unspecified, uncomplicated: Secondary | ICD-10-CM

## 2011-08-24 DIAGNOSIS — I701 Atherosclerosis of renal artery: Secondary | ICD-10-CM

## 2011-08-24 DIAGNOSIS — I25810 Atherosclerosis of coronary artery bypass graft(s) without angina pectoris: Secondary | ICD-10-CM

## 2011-08-24 DIAGNOSIS — I340 Nonrheumatic mitral (valve) insufficiency: Secondary | ICD-10-CM

## 2011-08-24 DIAGNOSIS — I6350 Cerebral infarction due to unspecified occlusion or stenosis of unspecified cerebral artery: Secondary | ICD-10-CM

## 2011-08-24 DIAGNOSIS — I6529 Occlusion and stenosis of unspecified carotid artery: Secondary | ICD-10-CM

## 2011-08-24 LAB — LIPID PANEL
HDL: 43.9 mg/dL (ref >39.00)
Total CHOL/HDL Ratio: 6
Triglycerides: 112 mg/dL (ref 0.0–149.0)

## 2011-08-24 LAB — HEPATIC FUNCTION PANEL
Bilirubin, Direct: 0.1 mg/dL (ref 0.0–0.3)
Total Bilirubin: 0.5 mg/dL (ref 0.3–1.2)

## 2011-08-24 LAB — BASIC METABOLIC PANEL
CO2: 24 mEq/L (ref 19–32)
Glucose, Bld: 86 mg/dL (ref 70–99)
Potassium: 4.3 mEq/L (ref 3.5–5.1)
Sodium: 139 mEq/L (ref 135–145)

## 2011-08-29 ENCOUNTER — Other Ambulatory Visit: Payer: Medicare Other

## 2011-09-02 ENCOUNTER — Encounter: Payer: Self-pay | Admitting: *Deleted

## 2011-09-09 ENCOUNTER — Other Ambulatory Visit: Payer: Medicare Other

## 2011-09-09 ENCOUNTER — Ambulatory Visit: Payer: Medicare Other | Admitting: Vascular Surgery

## 2011-09-28 ENCOUNTER — Other Ambulatory Visit: Payer: Self-pay | Admitting: Cardiology

## 2011-10-05 ENCOUNTER — Ambulatory Visit (INDEPENDENT_AMBULATORY_CARE_PROVIDER_SITE_OTHER): Payer: Medicare Other | Admitting: *Deleted

## 2011-10-05 DIAGNOSIS — I340 Nonrheumatic mitral (valve) insufficiency: Secondary | ICD-10-CM

## 2011-10-05 DIAGNOSIS — I059 Rheumatic mitral valve disease, unspecified: Secondary | ICD-10-CM

## 2011-10-05 DIAGNOSIS — Z7901 Long term (current) use of anticoagulants: Secondary | ICD-10-CM

## 2011-10-05 DIAGNOSIS — I6350 Cerebral infarction due to unspecified occlusion or stenosis of unspecified cerebral artery: Secondary | ICD-10-CM

## 2011-10-20 ENCOUNTER — Encounter: Payer: Self-pay | Admitting: Vascular Surgery

## 2011-10-21 ENCOUNTER — Encounter (INDEPENDENT_AMBULATORY_CARE_PROVIDER_SITE_OTHER): Payer: Medicare Other

## 2011-10-21 ENCOUNTER — Ambulatory Visit (INDEPENDENT_AMBULATORY_CARE_PROVIDER_SITE_OTHER): Payer: Medicare Other | Admitting: *Deleted

## 2011-10-21 ENCOUNTER — Other Ambulatory Visit: Payer: Medicare Other

## 2011-10-21 ENCOUNTER — Encounter: Payer: Self-pay | Admitting: Vascular Surgery

## 2011-10-21 ENCOUNTER — Other Ambulatory Visit (INDEPENDENT_AMBULATORY_CARE_PROVIDER_SITE_OTHER): Payer: Medicare Other | Admitting: *Deleted

## 2011-10-21 ENCOUNTER — Ambulatory Visit (INDEPENDENT_AMBULATORY_CARE_PROVIDER_SITE_OTHER): Payer: Medicare Other | Admitting: Vascular Surgery

## 2011-10-21 VITALS — BP 193/83 | HR 85 | Resp 16 | Ht 71.0 in | Wt 162.2 lb

## 2011-10-21 DIAGNOSIS — I701 Atherosclerosis of renal artery: Secondary | ICD-10-CM

## 2011-10-21 DIAGNOSIS — Z48812 Encounter for surgical aftercare following surgery on the circulatory system: Secondary | ICD-10-CM

## 2011-10-21 DIAGNOSIS — I70409 Unspecified atherosclerosis of autologous vein bypass graft(s) of the extremities, unspecified extremity: Secondary | ICD-10-CM

## 2011-10-21 DIAGNOSIS — I70219 Atherosclerosis of native arteries of extremities with intermittent claudication, unspecified extremity: Secondary | ICD-10-CM

## 2011-10-21 DIAGNOSIS — I6529 Occlusion and stenosis of unspecified carotid artery: Secondary | ICD-10-CM

## 2011-10-21 DIAGNOSIS — I7409 Other arterial embolism and thrombosis of abdominal aorta: Secondary | ICD-10-CM | POA: Insufficient documentation

## 2011-10-21 NOTE — Progress Notes (Signed)
[facility]  Referred by:  Marye Round, MD [address] [place], [place] [address]  Reason for referral: follow-up on multiple vascular interventions  History of Present Illness  Clinton [name] is a 71 y.o. (Sep 29, 1940) male who presents with chief complaint: follow up on multiple vascular interventions.  This pt previous underwent aortobifemoral bypass and aorto-right renal bypass, B CEA, L ICA and L CCA stenting.  The patient has been seen in the vascular lab but not by a provider since 2008.  He denies any claudication or rest pain symptoms.  He denies any stroke or TIA sx.  He denies any left arm sx with exertion.  He denies any change in his kidney function or urinary habits.  His risks for atherosclerosis include: Hypertension, hyperlipidemia, and tobacco abuse.  Past Medical History  Diagnosis Date  . Peripheral arterial disease   . Carotid artery occlusion   . CAD (coronary artery disease)   . Cardiomyopathy, ischemic   . Stroke     remote  . Hypertension   . Hyperlipidemia   . Substance abuse     tobacco    Past Surgical History  Procedure Date  . Carotid endarterectomy   . Coronary artery bypass graft   . Pr vein bypass graft,aorto-fem-pop   . Ureteral artery renal bypass     History   Social History  . Marital Status: Divorced    Spouse Name: N/A    Number of Children: N/A  . Years of Education: N/A   Occupational History  . Not on file.   Social History Main Topics  . Smoking status: Current Everyday Smoker    Types: Cigarettes  . Smokeless tobacco: Not on file  . Alcohol Use: No  . Drug Use: No  . Sexually Active: Not on file   Other Topics Concern  . Not on file   Social History Narrative  . No narrative on file    History reviewed. No pertinent family history.  Current Outpatient Prescriptions on File Prior to Visit  Medication Sig Dispense Refill  . amLODipine (NORVASC) 10 MG tablet Take 10 mg  by mouth daily.        Marland Kitchen ezetimibe (ZETIA) 10 MG tablet Take 10 mg by mouth daily.        . hydrALAZINE (APRESOLINE) 100 MG tablet take 1 tablet by mouth three times a day  90 tablet  11  . hydrALAZINE (APRESOLINE) 50 MG tablet Take 100 mg by mouth 3 (three) times daily.       . hydrochlorothiazide 25 MG tablet Take 25 mg by mouth daily.        Marland Kitchen losartan (COZAAR) 100 MG tablet Take 100 mg by mouth daily.        . metoprolol succinate (TOPROL XL) 100 MG 24 hr tablet Take 1 tablet (100 mg total) by mouth daily. Take with or immediately following a meal.  30 tablet  11  . rosuvastatin (CRESTOR) 40 MG tablet Take 40 mg by mouth daily.        Marland Kitchen warfarin (COUMADIN) 6 MG tablet take 1 tablet by mouth as directed  35 tablet  3    Allergies  Allergen Reactions  . Codeine Phosphate     REACTION: unspecified    Review of Systems (Positive items checked otherwise negative)  General: [ ]  Weight loss, [ ]  Weight gain, [ ]   Loss of appetite, [ ]  Fever  Neurologic: [ ]  Dizziness, [ ]   Blackouts, [ ]  Headaches, [ ]  Seizure  Ear/Nose/Throat: [ ]  Change in eyesight, [ ]  Change in hearing, [ ]  Nose bleeds, [ ]  Sore throat  Vascular: [ ]  Pain in legs with walking, [ ]  Pain in feet while lying flat, [ ]  Non-healing ulcer, [x]  previous R Stroke, [ ]  "Mini stroke", [ ]  Slurred speech, [ ]  Temporary blindness, [ ]  Blood clot in vein, [ ]  Phlebitis  Pulmonary: [ ]  Home oxygen, [ ]  Productive cough, [ ]  Bronchitis, [ ]  Coughing up blood, [ ]  Asthma, [ ]  Wheezing  Musculoskeletal: [ ]  Arthritis, [ ]  Joint pain, [ ]  Muscle pain  Cardiac: [ ]  Chest pain, [ ]  Chest tightness/pressure, [ ]  Shortness of breath when lying flat, [ ]  Shortness of breath with exertion, [ ]  Palpitations, [ ]  Heart murmur, [ ]  Arrythmia, [ ]  Atrial fibrillation  Hematologic: [ ]  Bleeding problems, [ ]  Clotting disorder, [ ]  Anemia  Psychiatric:  [ ]  Depression, [ ]  Anxiety, [ ]  Attention deficit disorder  Gastrointestinal:  [ ]  Black  stool,[ ]   Blood in stool, [ ]  Peptic ulcer disease, [ ]  Reflux, [ ]  Hiatal hernia, [ ]  Trouble swallowing, [ ]  Diarrhea, [ ]  Constipation  Urinary:  [ ]  Kidney disease, [ ]  Burning with urination, [ ]  Frequent urination, [ ]  Difficulty urinating  Skin: [ ]  Ulcers, [ ]  Rashes  Physical Examination  Filed Vitals:   10/21/11 1147  BP: 193/83  Pulse: 85  Resp: 16  Height: 5\' 11"  (1.803 m)  Weight: 162 lb 3.2 oz (73.573 kg)  SpO2: 98%   Body mass index is 22.62 kg/(m^2).  General: A&O x 3, WDWN, thin  Head: [place]/AT  Ear/Nose/Throat: Hearing grossly intact, nares w/o erythema or drainage, oropharynx w/o Erythema/Exudate  Eyes: PERRLA, EOMI  Neck: Supple, no nuchal rigidity, no palpable LAD  Pulmonary: Sym exp, good air movt, CTAB, no rales, rhonchi, & wheezing  Cardiac: RRR, Nl S1, S2, no Murmurs, rubs or gallops  Vascular: Vessel Right Left  Radial Palpable Palpable  Brachial Palpable Palpable  Carotid Palpable, without bruit Palpable, without bruit  Aorta Non-palpable N/A  Femoral Palpable Palpable  Popliteal Non-palpable Non-palpable  PT Palpable Palpable  DP Palpable Palpable   Gastrointestinal: soft, NTND, -G/R, - HSM, - masses, - CVAT B  Musculoskeletal: M/S 5/5 throughout , Extremities without ischemic changes   Neurologic: CN 2-12 intact , Pain and light touch intact in extremities , Motor exam as listed above  Psychiatric: Judgment intact, Mood & affect appropriate for pt's clinical situation  Dermatologic: See M/S exam for extremity exam, no rashes otherwise noted  Lymph : No Cervical, Axillary, or Inguinal lymphadenopathy   Non-Invasive Vascular Imaging  CAROTID DUPLEX (Date: 10/20/11):   R ICA stenosis: 40-59%  R VA: patent and antegrade  L ICA stenosis: 60-79% (low end)  L VA:  patent and bidirectional  >50% stent L distal CCA/stent  ABI (Date: 10/20/11)  RLE: 0.87, PT: monophasic, DP: barely biphasic  LLE: 0.92, PT: biphasic, DP:  monophasic  Renal Duplex (Date: 10/20/11)  R kidney size: 11.4 cm   L kidney size: 10.2 cm  R aortorenal bypass: 54-107 c/s (patent)  L RA stenosis: >60% PSV 404  Medical Decision Making  Clinton [name] is a 71 y.o. male who presents with: s/p Aobifemoral bypass for aortoiliac occlusive disease, s/p aortorenal (R) bypass for bilateral renal arterial disease, possible high grade L renal stenosis, s/p B CEA for asx and sx caortid  disease and recurrent stenosis   His PAD remains stable as does his carotid disease.  I would maintain both on a 6 month surveillance cycle.  I discussed with the patient that the renal duplex suggests possible L RAS especially with kidney size contraction.  However, the renal arterial angiogram and possible intervention may cause renal failure so he wanted to defer immediate intervention.    We will subsequently repeat the B renal arterial duplex in 3 months to determine if any progression has occurred, and intervene if the repeat ultrasound demonstrates progression.  I discussed with him this choice of action may result in infarction of the kidney, but as I can't guarantee that there won't be renal artery embolization with intervention anyway, I don't think this choice of management is unreasonable.    I discussed in depth with the patient the nature of atherosclerosis, and emphasized the importance of maximal medical management including strict control of blood pressure, blood glucose, and lipid levels, antiplatelet agents, obtaining regular exercise, and cessation of smoking.  The patient is aware that without maximal medical management the underlying atherosclerotic disease process will progress, limiting the benefit of any interventions.  Thank you for allowing [place] to participate in this patient's care.  Leonides Sake, MD [facility] Office: [phone] Pager: [phone]  10/21/2011, 1:39 PM

## 2011-10-21 NOTE — Progress Notes (Deleted)
Patient ID: Clinton [name], male   DOB: May 24, 1941, 71 y.o.   MRN: [id number]

## 2011-10-26 NOTE — Procedures (Unsigned)
RENAL ARTERY DUPLEX EVALUATION  INDICATION:  Follow up right renal artery bypass graft placed in 2004  HISTORY: History of aorta dissection.  08/14/2002 aortofemoral bypass graft; right aorta to renal bypass graft (Dacron).  Note:  Small left renal artery at the inferior pole.  Left renal artery superior pole has a small amount of plaque.  Left renal artery not dissected for revascularization, per Dr. Florina Ou notes. Diabetes:  No Cardiac:  MI, CABG 1985 Hypertension:  Yes Smoking:  Yes  RENAL ARTERY DUPLEX FINDINGS:  Aorta-Proximal:  77 cm/s Aorta-Mid:  125 cm/s Aorta-Distal:  57 cm/s Celiac Artery Origin:  179 cm/s SMA Origin:  403 cm/s                                   RIGHT               LEFT Renal Artery Origin:             54 cm/s (bypass graft)                 >404 cm/s Renal Artery Proximal:           107 cm/s (bypass graft)                >391 cm/s Renal Artery Mid:                115 cm/s            70 cm/s Renal Artery Distal:             62 cm/s             38 cm/s Hilar Acceleration Time (AT): Renal-Aortic Ratio (RAR): Kidney Size:                     11.4 cm             10.2 cm End Diastolic Ratio (EDR): Resistive Index (RI):  AORTOBIFEMORAL BYPASS GRAFT:  Right limb 102, left limb 61  IMPRESSION: 1. Pleural effusion. 2. Right renal artery bypass graft is patent, origin not well     visualized. 3. Severe stenosis in the left renal artery origin. 4. Increased velocities at the superior mesenteric artery origin. 5. Technically difficult exam due to congestive heart failure.  ___________________________________________ Fransisco Hertz, MD  SS/MEDQ  D:  10/21/2011  T:  10/21/2011  Job:  [id number]

## 2011-10-26 NOTE — Procedures (Unsigned)
CAROTID DUPLEX EXAM  INDICATION:  Followup carotid artery disease  HISTORY: Diabetes:  No Cardiac:  MI, coronary artery bypass graft 1985 Hypertension:  Yes Smoking:  Yes Previous Surgery:  Left ICA and CCA stent 06/05/2007 by Dr. Madilyn Fireman, left carotid endarterectomy 07/05/1991, right common carotid artery endarterectomy 12/26/1992 CV History:  Asymptomatic Amaurosis Fugax No, Paresthesias No, Hemiparesis No                                      RIGHT             LEFT Brachial systolic pressure:         178               182 Brachial Doppler waveforms:         Triphasic         Biphasic Vertebral direction of flow:        Antegrade         Bidirectional DUPLEX VELOCITIES (cm/sec) CCA peak systolic                   242 (proximal)    180 (distal stent) ECA peak systolic                   122               436 ICA peak systolic                   125               140 (proximal to mid) ICA end diastolic                   40                49 PLAQUE MORPHOLOGY:                  Mixed, irregular  Mixed PLAQUE AMOUNT:                      Mild              Moderate   PLAQUE LOCATION:  Bifurcation, ICA and ECA      CCA, ECA, bifurcation 147/34    IMPRESSION: 1. 40%-59% right internal carotid artery stenosis. 2. 60%-79% left internal carotid artery stenosis (low end of range). 3. Greater than 50% stenosis left distal common carotid artery/stent. 4. Left external carotid artery stenosis.  ___________________________________________ Fransisco Hertz, MD  SS/MEDQ  D:  10/21/2011  T:  10/21/2011  Job:  [id number]

## 2011-11-16 ENCOUNTER — Ambulatory Visit (INDEPENDENT_AMBULATORY_CARE_PROVIDER_SITE_OTHER): Payer: Medicare Other | Admitting: Pharmacist

## 2011-11-16 DIAGNOSIS — I340 Nonrheumatic mitral (valve) insufficiency: Secondary | ICD-10-CM

## 2011-11-16 DIAGNOSIS — Z7901 Long term (current) use of anticoagulants: Secondary | ICD-10-CM

## 2011-11-16 DIAGNOSIS — I6350 Cerebral infarction due to unspecified occlusion or stenosis of unspecified cerebral artery: Secondary | ICD-10-CM

## 2011-11-16 DIAGNOSIS — I059 Rheumatic mitral valve disease, unspecified: Secondary | ICD-10-CM

## 2011-12-26 ENCOUNTER — Ambulatory Visit (INDEPENDENT_AMBULATORY_CARE_PROVIDER_SITE_OTHER): Payer: Medicare Other | Admitting: Pharmacist

## 2011-12-26 ENCOUNTER — Telehealth: Payer: Self-pay | Admitting: Cardiology

## 2011-12-26 DIAGNOSIS — I6350 Cerebral infarction due to unspecified occlusion or stenosis of unspecified cerebral artery: Secondary | ICD-10-CM

## 2011-12-26 DIAGNOSIS — I340 Nonrheumatic mitral (valve) insufficiency: Secondary | ICD-10-CM

## 2011-12-26 DIAGNOSIS — I059 Rheumatic mitral valve disease, unspecified: Secondary | ICD-10-CM

## 2011-12-26 DIAGNOSIS — Z7901 Long term (current) use of anticoagulants: Secondary | ICD-10-CM

## 2011-12-26 LAB — POCT INR: INR: 2.4

## 2011-12-26 NOTE — Telephone Encounter (Signed)
Spoke with pt.  He is not leaving for vacation until 7/11.  Explained the appt on 7/3 is already a 6 week appt and if we waited until 7/24, that would be 9 weeks later.  Since his vacation is still a week away, asked if he could keep his 7/3 appt or come in a different day this week.  Appt made for this afternoon at 4:30.

## 2011-12-26 NOTE — Telephone Encounter (Signed)
New Problem:    Patient called in wanting to change his appointment from 12/28/11 to 01/18/12 because he will be out of town.  Please call back.

## 2012-01-26 ENCOUNTER — Encounter: Payer: Self-pay | Admitting: Neurosurgery

## 2012-01-27 ENCOUNTER — Encounter: Payer: Self-pay | Admitting: Neurosurgery

## 2012-01-27 ENCOUNTER — Ambulatory Visit (INDEPENDENT_AMBULATORY_CARE_PROVIDER_SITE_OTHER): Payer: Medicare Other | Admitting: Neurosurgery

## 2012-01-27 ENCOUNTER — Other Ambulatory Visit (INDEPENDENT_AMBULATORY_CARE_PROVIDER_SITE_OTHER): Payer: Medicare Other | Admitting: *Deleted

## 2012-01-27 VITALS — BP 185/89 | HR 81 | Resp 16 | Ht 71.5 in | Wt 160.2 lb

## 2012-01-27 DIAGNOSIS — I701 Atherosclerosis of renal artery: Secondary | ICD-10-CM

## 2012-01-27 NOTE — Progress Notes (Signed)
[facility] Office Note  CC: Three-month PVD followup Referring Physician: Imogene Burn  History of Present Illness: 71 y.o. (11-Oct-1940) male who presents with chief complaint: follow up on multiple vascular interventions. This pt previous underwent aortobifemoral bypass and aorto-right renal bypass, B CEA, L ICA and L CCA stenting. The patient denies any abdominal or lower extremity pain. The patient denies claudication, rest pain or open ulcerations of lower extremities. The patient denies any new medical diagnoses or recent surgery.   Past Medical History  Diagnosis Date  . Peripheral arterial disease   . Carotid artery occlusion   . CAD (coronary artery disease)   . Cardiomyopathy, ischemic   . Stroke     remote  . Hypertension   . Hyperlipidemia   . Substance abuse     tobacco    ROS: [x]  Positive   [ ]  Denies    General: [ ]  Weight loss, [ ]  Fever, [ ]  chills Neurologic: [ ]  Dizziness, [ ]  Blackouts, [ ]  Seizure [ ]  Stroke, [ ]  "Mini stroke", [ ]  Slurred speech, [ ]  Temporary blindness; [ ]  weakness in arms or legs, [ ]  Hoarseness Cardiac: [ ]  Chest pain/pressure, [ ]  Shortness of breath at rest [ ]  Shortness of breath with exertion, [ ]  Atrial fibrillation or irregular heartbeat Vascular: [ ]  Pain in legs with walking, [ ]  Pain in legs at rest, [ ]  Pain in legs at night,  [ ]  Non-healing ulcer, [ ]  Blood clot in vein/DVT,   Pulmonary: [ ]  Home oxygen, [ ]  Productive cough, [ ]  Coughing up blood, [ ]  Asthma,  [ ]  Wheezing Musculoskeletal:  [ ]  Arthritis, [ ]  Low back pain, [ ]  Joint pain Hematologic: [ ]  Easy Bruising, [ ]  Anemia; [ ]  Hepatitis Gastrointestinal: [ ]  Blood in stool, [ ]  Gastroesophageal Reflux/heartburn, [ ]  Trouble swallowing Urinary: [ ]  chronic Kidney disease, [ ]  on HD - [ ]  MWF or [ ]  TTHS, [ ]  Burning with urination, [ ]  Difficulty urinating Skin: [ ]  Rashes, [ ]  Wounds Psychological: [ ]  Anxiety, [ ]   Depression   Social History History  Substance Use Topics  . Smoking status: Current Everyday Smoker    Types: Cigarettes  . Smokeless tobacco: Not on file  . Alcohol Use: No    Family History History reviewed. No pertinent family history.  Allergies  Allergen Reactions  . Codeine Phosphate Nausea Only    REACTION: unspecified    Current Outpatient Prescriptions  Medication Sig Dispense Refill  . amLODipine (NORVASC) 10 MG tablet Take 10 mg by mouth daily.        Marland Kitchen ezetimibe (ZETIA) 10 MG tablet Take 10 mg by mouth daily.        . hydrALAZINE (APRESOLINE) 100 MG tablet take 1 tablet by mouth three times a day  90 tablet  11  . hydrALAZINE (APRESOLINE) 50 MG tablet Take 100 mg by mouth 3 (three) times daily.       . hydrochlorothiazide 25 MG tablet Take 25 mg by mouth daily.        Marland Kitchen losartan (COZAAR) 100 MG tablet Take 100 mg by mouth daily.        . metoprolol succinate (TOPROL XL) 100 MG 24 hr tablet Take 1 tablet (100 mg total) by mouth daily. Take with or immediately following a meal.  30 tablet  11  . rosuvastatin (CRESTOR) 40 MG tablet Take 40 mg by mouth daily.        Marland Kitchen  warfarin (COUMADIN) 6 MG tablet take 1 tablet by mouth as directed  35 tablet  3    Physical Examination  Filed Vitals:   01/27/12 1110  BP: 185/89  Pulse: 81  Resp: 16    Body mass index is 22.03 kg/(m^2).  General:  WDWN in NAD Gait: Normal HEENT: WNL Eyes: Pupils equal Pulmonary: normal non-labored breathing , without Rales, rhonchi,  wheezing Cardiac: RRR, without  Murmurs, rubs or gallops; No carotid bruits Abdomen: soft, NT, no masses Skin: no rashes, ulcers noted Vascular Exam/Pulses: Lower extremities are well-perfused, 3+ radial pulses bilaterally, no carotid bruits are heard to  Extremities without ischemic changes, no Gangrene , no cellulitis; no open wounds;  Musculoskeletal: no muscle wasting or atrophy  Neurologic: A&O X 3; Appropriate Affect ; SENSATION: normal; MOTOR  FUNCTION:  moving all extremities equally. Speech is fluent/normal  Non-Invasive Vascular Imaging: Renal artery duplex evaluation today shows a patent right aortorenal graft, kidney size is 10.0 cm on the right, 10.1 cm on the left RI is 0.80/0.78 on the right, 0.64 on the left, aortic stenosis is observed distally to the SMA proximal to the renals greater than 50%. This was reviewed by Dr. Imogene Burn and compared to the last study.  ASSESSMENT/PLAN: Per Dr. Imogene Burn the patient may have an aortogram with bilateral runoff to further evaluate his problem or return in 3 months and repeat the same study as above, the patient has chosen to return in 3 months and repeat the studies and be seen by Dr. Imogene Burn. Otherwise, the patient's questions were encouraged and answered, he is in agreement with the above plan.  Lauree Chandler ANP  Clinic M.D.: Imogene Burn

## 2012-02-03 NOTE — Procedures (Unsigned)
RENAL ARTERY DUPLEX EVALUATION  INDICATION:  Followup bilateral renal artery stenosis, aortobifemoral graft with graft to right renal artery placed 2004; the aortobifemoral graft has an end-to-end anastomosis proximally and end-to-side anastomosis distally.  The right renal artery graft is end-to-side anastomosis with the aortobifemoral graft and end-to-end anastomosis with the right renal artery.  There are known dual left renal arteries.  HISTORY: Diabetes:  No Cardiac:  No Hypertension:  Yes Smoking:  Yes  RENAL ARTERY DUPLEX FINDINGS: Aorta-Proximal:  59 cm/s Aorta-Mid:  126 cm/s Aorta-Distal:  59 cm/s Celiac Artery Origin:  304 cm/s SMA Origin:  437 cm/s                                   RIGHT               LEFT Renal Artery Origin:             105 cm/s            NV/NV Renal Artery Proximal:           102 cm/s            161/NV cm/s Renal Artery Mid:                142 cm/s            124/41 cm/s Renal Artery Distal:             110 cm/s            53/30 cm/s Hilar Acceleration Time (AT):    m/s2                m/s2 Renal-Aortic Ratio (RAR):        Invalid             Invalid Kidney Size:                     10 cm               10.1 cm End Diastolic Ratio (EDR): Resistive Index (RI):            0.80/0.78           0.64  IMPRESSION: 1. Aortic stenosis is observed distal to the superior mesenteric     artery  and proximal to the renal arteries which is greater than     50%. 2. Widely patent right aortorenal graft with slightly dampened     waveforms, likely due to aortic stenosis. 3. Dual left renal arteries are patent but difficult to visualize.     Waveforms are dampened distally but without significant turbulence.     Velocities are mildly elevated in the superior pole artery which     may suggest less than 60% stenosis. 4. Renal aortic ratios are invalid due to aortic stenosis. 5. Bilateral kidney size is approximately 10 cm. 6. 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  ___________________________________________ Fransisco Hertz, MD  LT/MEDQ  D:  01/27/2012  T:  01/27/2012  Job:  [id number]

## 2012-02-06 ENCOUNTER — Ambulatory Visit (INDEPENDENT_AMBULATORY_CARE_PROVIDER_SITE_OTHER): Payer: Medicare Other | Admitting: *Deleted

## 2012-02-06 DIAGNOSIS — I340 Nonrheumatic mitral (valve) insufficiency: Secondary | ICD-10-CM

## 2012-02-06 DIAGNOSIS — I6350 Cerebral infarction due to unspecified occlusion or stenosis of unspecified cerebral artery: Secondary | ICD-10-CM

## 2012-02-06 DIAGNOSIS — I059 Rheumatic mitral valve disease, unspecified: Secondary | ICD-10-CM

## 2012-02-06 DIAGNOSIS — Z7901 Long term (current) use of anticoagulants: Secondary | ICD-10-CM

## 2012-02-06 LAB — POCT INR: INR: 2.3

## 2012-02-15 ENCOUNTER — Other Ambulatory Visit: Payer: Self-pay | Admitting: Cardiology

## 2012-03-21 ENCOUNTER — Ambulatory Visit (INDEPENDENT_AMBULATORY_CARE_PROVIDER_SITE_OTHER): Payer: Medicare Other | Admitting: *Deleted

## 2012-03-21 DIAGNOSIS — I059 Rheumatic mitral valve disease, unspecified: Secondary | ICD-10-CM

## 2012-03-21 DIAGNOSIS — I6350 Cerebral infarction due to unspecified occlusion or stenosis of unspecified cerebral artery: Secondary | ICD-10-CM

## 2012-03-21 DIAGNOSIS — I340 Nonrheumatic mitral (valve) insufficiency: Secondary | ICD-10-CM

## 2012-03-21 DIAGNOSIS — Z7901 Long term (current) use of anticoagulants: Secondary | ICD-10-CM

## 2012-03-21 LAB — POCT INR: INR: 2.6

## 2012-05-02 ENCOUNTER — Ambulatory Visit (INDEPENDENT_AMBULATORY_CARE_PROVIDER_SITE_OTHER): Payer: Medicare Other | Admitting: *Deleted

## 2012-05-02 DIAGNOSIS — I059 Rheumatic mitral valve disease, unspecified: Secondary | ICD-10-CM

## 2012-05-02 DIAGNOSIS — I6350 Cerebral infarction due to unspecified occlusion or stenosis of unspecified cerebral artery: Secondary | ICD-10-CM

## 2012-05-02 DIAGNOSIS — I340 Nonrheumatic mitral (valve) insufficiency: Secondary | ICD-10-CM

## 2012-05-02 DIAGNOSIS — Z7901 Long term (current) use of anticoagulants: Secondary | ICD-10-CM

## 2012-05-02 LAB — POCT INR: INR: 3.7

## 2012-05-04 ENCOUNTER — Other Ambulatory Visit: Payer: Medicare Other

## 2012-05-04 ENCOUNTER — Ambulatory Visit: Payer: Medicare Other | Admitting: Vascular Surgery

## 2012-05-11 ENCOUNTER — Ambulatory Visit: Payer: Medicare Other | Admitting: Vascular Surgery

## 2012-05-11 ENCOUNTER — Other Ambulatory Visit: Payer: Medicare Other

## 2012-05-23 ENCOUNTER — Ambulatory Visit (INDEPENDENT_AMBULATORY_CARE_PROVIDER_SITE_OTHER): Payer: Medicare Other | Admitting: *Deleted

## 2012-05-23 DIAGNOSIS — I6350 Cerebral infarction due to unspecified occlusion or stenosis of unspecified cerebral artery: Secondary | ICD-10-CM

## 2012-05-23 DIAGNOSIS — Z7901 Long term (current) use of anticoagulants: Secondary | ICD-10-CM

## 2012-05-23 DIAGNOSIS — I340 Nonrheumatic mitral (valve) insufficiency: Secondary | ICD-10-CM

## 2012-05-23 DIAGNOSIS — I059 Rheumatic mitral valve disease, unspecified: Secondary | ICD-10-CM

## 2012-05-23 LAB — POCT INR: INR: 2.5

## 2012-06-21 ENCOUNTER — Encounter: Payer: Self-pay | Admitting: Vascular Surgery

## 2012-06-22 ENCOUNTER — Other Ambulatory Visit (INDEPENDENT_AMBULATORY_CARE_PROVIDER_SITE_OTHER): Payer: Medicare Other | Admitting: *Deleted

## 2012-06-22 ENCOUNTER — Ambulatory Visit (INDEPENDENT_AMBULATORY_CARE_PROVIDER_SITE_OTHER): Payer: Medicare Other | Admitting: Vascular Surgery

## 2012-06-22 ENCOUNTER — Ambulatory Visit (INDEPENDENT_AMBULATORY_CARE_PROVIDER_SITE_OTHER): Payer: Medicare Other | Admitting: *Deleted

## 2012-06-22 ENCOUNTER — Encounter: Payer: Self-pay | Admitting: Vascular Surgery

## 2012-06-22 VITALS — BP 161/63 | HR 99 | Ht 71.5 in | Wt 160.0 lb

## 2012-06-22 DIAGNOSIS — I6350 Cerebral infarction due to unspecified occlusion or stenosis of unspecified cerebral artery: Secondary | ICD-10-CM

## 2012-06-22 DIAGNOSIS — I359 Nonrheumatic aortic valve disorder, unspecified: Secondary | ICD-10-CM

## 2012-06-22 DIAGNOSIS — I340 Nonrheumatic mitral (valve) insufficiency: Secondary | ICD-10-CM

## 2012-06-22 DIAGNOSIS — I350 Nonrheumatic aortic (valve) stenosis: Secondary | ICD-10-CM

## 2012-06-22 DIAGNOSIS — Z48812 Encounter for surgical aftercare following surgery on the circulatory system: Secondary | ICD-10-CM

## 2012-06-22 DIAGNOSIS — K559 Vascular disorder of intestine, unspecified: Secondary | ICD-10-CM

## 2012-06-22 DIAGNOSIS — Z7901 Long term (current) use of anticoagulants: Secondary | ICD-10-CM

## 2012-06-22 DIAGNOSIS — I701 Atherosclerosis of renal artery: Secondary | ICD-10-CM

## 2012-06-22 DIAGNOSIS — I739 Peripheral vascular disease, unspecified: Secondary | ICD-10-CM

## 2012-06-22 DIAGNOSIS — I6529 Occlusion and stenosis of unspecified carotid artery: Secondary | ICD-10-CM

## 2012-06-22 DIAGNOSIS — I059 Rheumatic mitral valve disease, unspecified: Secondary | ICD-10-CM

## 2012-06-22 LAB — POCT INR: INR: 4.1

## 2012-06-22 NOTE — Addendum Note (Signed)
Addended by: Melodye Ped C on: 06/22/2012 12:07 PM   Modules accepted: Orders

## 2012-06-22 NOTE — Progress Notes (Signed)
[facility]  History of Present Illness  Clinton [name] is a 72 y.o. (Apr 02, 1941) male who presents with chief complaint: routine follow-up.  This pt previous underwent aortobifemoral bypass and aorto-right renal bypass, B CEA, L ICA and L CCA stenting. He denies any claudication or rest pain symptoms. He denies any stroke or TIA sx. He denies any left arm sx with exertion. He denies any change in his kidney function or urinary habits.   The patient's symptoms have not progressed.  The patient's symptoms are: none.   The patient's treatment regimen currently included: maximal medical management.  The patient denies wt loss or food fear.  He has no abdominal pain.  Past Medical History, Past Surgical History, Social History, Family History, Medications, Allergies, and Review of Systems are unchanged from previous evaluation on 01/27/12.  Physical Examination  Filed Vitals:   06/22/12 1106  BP: 161/63  Pulse: 99  Height: 5' 11.5" (1.816 m)  Weight: 160 lb (72.576 kg)  SpO2: 96%   Body mass index is 22.00 kg/(m^2).  General: A&O x 3, WDWN  Pulmonary: Sym exp, good air movt, CTAB, no rales, rhonchi, & wheezing  Cardiac: RRR, Nl S1, S2, no Murmurs, rubs or gallops  Vascular: Vessel Right Left  Radial Palpable Palpable  Ulnar Palpable Palpable  Brachial Palpable Palpable  Carotid Palpable, without bruit Palpable, without bruit  Aorta Not palpable N/A  Femoral Prominently Palpable Prominently Palpable  Popliteal Not palpable Not palpable  PT Not Palpable Not Palpable  DP Faintly Palpable Faintly Palpable   Musculoskeletal: M/S 5/5 throughout , Extremities without ischemic changes , B feet warm  Neurologic: Pain and light touch intact in extremities , Motor exam as listed above  Non-Invasive Vascular Imaging Renal Duplex (Date: 06/22/12)  R:  Size 10.7 cm, Graft patent: 84-212 c/s  L: 11 cm, Mid seg 69 c/s  SMA: 433 c/s  CA: 478 c/s  Medical  Decision Making  Clinton [name] is a 71 y.o. male who presents with: s/p Aobifemoral BPG, B CEA, and L ICA/CCA stenting  Based on the patient's vascular studies and examination, I have offered the patient: B carotid duplex and BLE ABI in one month.  Additionally, I would proceed with CTA Abd/Pelvis with the abnormal velocities in the mesenteric vessels and also to evaluate for an aortic stenosis previous seen on duplex.  The patient will follow up with [place] in one month with these studies.  I discussed in depth with the patient the nature of atherosclerosis, and emphasized the importance of maximal medical management including strict control of blood pressure, blood glucose, and lipid levels, antiplatelet agents, obtaining regular exercise, and cessation of smoking.  The patient is aware that without maximal medical management the underlying atherosclerotic disease process will progress, limiting the benefit of any interventions.  Thank you for allowing [place] to participate in this patient's care.  Leonides Sake, MD [facility] Office: [phone] Pager: [phone]  06/22/2012, 11:45 AM

## 2012-06-25 ENCOUNTER — Other Ambulatory Visit: Payer: Self-pay | Admitting: Cardiology

## 2012-06-25 MED ORDER — WARFARIN SODIUM 6 MG PO TABS: ORAL_TABLET | ORAL | Status: DC

## 2012-07-06 ENCOUNTER — Ambulatory Visit (INDEPENDENT_AMBULATORY_CARE_PROVIDER_SITE_OTHER): Payer: 59

## 2012-07-06 DIAGNOSIS — I059 Rheumatic mitral valve disease, unspecified: Secondary | ICD-10-CM

## 2012-07-06 DIAGNOSIS — Z7901 Long term (current) use of anticoagulants: Secondary | ICD-10-CM

## 2012-07-06 DIAGNOSIS — I6350 Cerebral infarction due to unspecified occlusion or stenosis of unspecified cerebral artery: Secondary | ICD-10-CM

## 2012-07-06 DIAGNOSIS — I340 Nonrheumatic mitral (valve) insufficiency: Secondary | ICD-10-CM

## 2012-07-17 ENCOUNTER — Other Ambulatory Visit: Payer: Self-pay | Admitting: Vascular Surgery

## 2012-07-17 LAB — CREATININE, SERUM: Creat: 1.49 mg/dL — ABNORMAL HIGH (ref 0.50–1.35)

## 2012-07-19 ENCOUNTER — Encounter: Payer: Self-pay | Admitting: Vascular Surgery

## 2012-07-20 ENCOUNTER — Ambulatory Visit: Payer: Medicare Other | Admitting: Vascular Surgery

## 2012-07-20 ENCOUNTER — Inpatient Hospital Stay: Admission: RE | Admit: 2012-07-20 | Payer: Medicare Other | Source: Ambulatory Visit

## 2012-07-27 ENCOUNTER — Ambulatory Visit (INDEPENDENT_AMBULATORY_CARE_PROVIDER_SITE_OTHER): Payer: 59 | Admitting: *Deleted

## 2012-07-27 DIAGNOSIS — Z7901 Long term (current) use of anticoagulants: Secondary | ICD-10-CM

## 2012-07-27 DIAGNOSIS — I340 Nonrheumatic mitral (valve) insufficiency: Secondary | ICD-10-CM

## 2012-07-27 DIAGNOSIS — I6350 Cerebral infarction due to unspecified occlusion or stenosis of unspecified cerebral artery: Secondary | ICD-10-CM

## 2012-07-27 DIAGNOSIS — I059 Rheumatic mitral valve disease, unspecified: Secondary | ICD-10-CM

## 2012-08-24 ENCOUNTER — Ambulatory Visit (INDEPENDENT_AMBULATORY_CARE_PROVIDER_SITE_OTHER): Payer: 59 | Admitting: *Deleted

## 2012-08-24 ENCOUNTER — Encounter: Payer: Self-pay | Admitting: Cardiology

## 2012-08-24 ENCOUNTER — Ambulatory Visit (INDEPENDENT_AMBULATORY_CARE_PROVIDER_SITE_OTHER): Payer: 59 | Admitting: Cardiology

## 2012-08-24 VITALS — BP 164/70 | HR 98 | Ht 71.0 in | Wt 157.0 lb

## 2012-08-24 DIAGNOSIS — I340 Nonrheumatic mitral (valve) insufficiency: Secondary | ICD-10-CM

## 2012-08-24 DIAGNOSIS — I059 Rheumatic mitral valve disease, unspecified: Secondary | ICD-10-CM

## 2012-08-24 DIAGNOSIS — I6350 Cerebral infarction due to unspecified occlusion or stenosis of unspecified cerebral artery: Secondary | ICD-10-CM

## 2012-08-24 DIAGNOSIS — Z7901 Long term (current) use of anticoagulants: Secondary | ICD-10-CM

## 2012-08-24 LAB — POCT INR: INR: 2.8

## 2012-08-24 MED ORDER — METOPROLOL SUCCINATE ER 25 MG PO TB24: 25.0000 mg | ORAL_TABLET | Freq: Every day | ORAL | Status: DC

## 2012-08-24 NOTE — Assessment & Plan Note (Signed)
Continue statin. Check lipids and liver. 

## 2012-08-24 NOTE — Patient Instructions (Addendum)
Your physician wants you to follow-up in: ONE YEAR WITH DR Shelda Pal will receive a reminder letter in the mail two months in advance. If you don't receive a letter, please call our office to schedule the follow-up appointment.   TAKE METOPROLOL 25 MG WITH THE METOPROLOL 100 MG ONCE DAILY= 125 MG   Your physician recommends that you return for lab work WITH NEXT COUMADIN APPT=DO NOT EAT PRIOR TO LAB WORK   Your physician has requested that you have an echocardiogram. Echocardiography is a painless test that uses sound waves to create images of your heart. It provides your doctor with information about the size and shape of your heart and how well your heart's chambers and valves are working. This procedure takes approximately one hour. There are no restrictions for this procedure.

## 2012-08-24 NOTE — Progress Notes (Signed)
HPI: Clinton [name] is a pleasant gentleman who has a history of coronary disease status post coronary bypassing graft, peripheral vascular disease, cerebrovascular disease, hypertension, hyperlipidemia. His last myoview was performed in July of 2011. This revealed previous inferior and lateral wall infarct with minimal ischemia in the mid lateral wall and base of the anterior wall. The LV is dilated with EF 29%. Echocardiogram in November of 2011 showed an ejection fraction of 35-40% and moderate mitral regurgitation. His ejection fraction was felt to be borderline for ICD and MRI was recommended to more fully assess. This was performed in December of 2011 and revealed an ejection fraction of 30%. Renal Dopplers in November of 2011 showed patent aortobifemoral grafts and normal renal arteries. 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 Seen by Dr Johney Frame in Feb 2012 for ICD but patient declined. Since I last saw him in Jan 2013, the patient denies any dyspnea on exertion, orthopnea, PND, pedal edema, palpitations, syncope or chest pain.   Current Outpatient Prescriptions  Medication Sig Dispense Refill  . amLODipine (NORVASC) 10 MG tablet Take 10 mg by mouth daily.        Marland Kitchen ezetimibe (ZETIA) 10 MG tablet Take 10 mg by mouth daily.        . hydrALAZINE (APRESOLINE) 100 MG tablet take 1 tablet by mouth three times a day  90 tablet  11  . hydrALAZINE (APRESOLINE) 50 MG tablet Take 100 mg by mouth 3 (three) times daily.       . hydrochlorothiazide 25 MG tablet Take 25 mg by mouth daily.        Marland Kitchen losartan (COZAAR) 100 MG tablet Take 100 mg by mouth daily.        . metoprolol succinate (TOPROL XL) 100 MG 24 hr tablet Take 1 tablet (100 mg total) by mouth daily. Take with or immediately following a meal.  30 tablet  11  . rosuvastatin (CRESTOR) 40 MG tablet Take 40 mg by mouth daily.        Marland Kitchen warfarin (COUMADIN) 6 MG tablet Take as directed by coumadin clinic  35 tablet  3   No current facility-administered  medications for this visit.     Past Medical History  Diagnosis Date  . Peripheral arterial disease   . Carotid artery occlusion   . CAD (coronary artery disease)   . Cardiomyopathy, ischemic   . Stroke     remote  . Hypertension   . Hyperlipidemia   . Substance abuse     tobacco    Past Surgical History  Procedure Laterality Date  . Carotid endarterectomy    . Coronary artery bypass graft    . Pr vein bypass graft,aorto-fem-pop    . Ureteral artery renal bypass      History   Social History  . Marital Status: Divorced    Spouse Name: N/A    Number of Children: N/A  . Years of Education: N/A   Occupational History  . Not on file.   Social History Main Topics  . Smoking status: Current Every Day Smoker -- 0.10 packs/day    Types: Cigarettes  . Smokeless tobacco: Never Used     Comment: pt states he smokes 2 cigs per day  . Alcohol Use: No  . Drug Use: No  . Sexually Active: Not on file   Other Topics Concern  . Not on file   Social History Narrative  . No narrative on file    ROS: recent flu symptoms that  are improving but no fevers or chills, productive cough, hemoptysis, dysphasia, odynophagia, melena, hematochezia, dysuria, hematuria, rash, seizure activity, orthopnea, PND, pedal edema, claudication. Remaining systems are negative.  Physical Exam: Well-developed well-nourished in no acute distress.  Skin is warm and dry.  HEENT is normal.  Neck is supple. Bilateral carotid bruits Chest is clear to auscultation with normal expansion.  Cardiovascular exam is regular rate and rhythm. 2/6 systolic murmur apex. Abdominal exam nontender or distended. No masses palpated. Mid abdominal bruit Extremities show no edema. neuro grossly intact  ECG sinus rhythm at a rate of 98. Left bundle branch block.

## 2012-08-24 NOTE — Assessment & Plan Note (Signed)
Continue statin. Followed by vascular surgery. 

## 2012-08-24 NOTE — Assessment & Plan Note (Signed)
Blood pressure elevated. Increase Toprol to 125 mg daily.

## 2012-08-24 NOTE — Assessment & Plan Note (Signed)
Repeat echocardiogram. 

## 2012-08-24 NOTE — Assessment & Plan Note (Signed)
Continue statin. Not on aspirin given need for Coumadin. We discussed repeating his Myoview today but he declined.

## 2012-08-24 NOTE — Assessment & Plan Note (Signed)
Continue statin. 

## 2012-08-24 NOTE — Assessment & Plan Note (Signed)
Continue ARB and beta blocker. Patient declines ICD.

## 2012-08-24 NOTE — Assessment & Plan Note (Signed)
Patient counseled on discontinuing. 

## 2012-09-21 ENCOUNTER — Other Ambulatory Visit: Payer: 59

## 2012-09-21 ENCOUNTER — Ambulatory Visit (INDEPENDENT_AMBULATORY_CARE_PROVIDER_SITE_OTHER): Payer: 59

## 2012-09-21 ENCOUNTER — Ambulatory Visit (HOSPITAL_COMMUNITY): Payer: Medicare HMO | Attending: Cardiology | Admitting: Radiology

## 2012-09-21 ENCOUNTER — Other Ambulatory Visit (INDEPENDENT_AMBULATORY_CARE_PROVIDER_SITE_OTHER): Payer: 59

## 2012-09-21 DIAGNOSIS — I10 Essential (primary) hypertension: Secondary | ICD-10-CM | POA: Insufficient documentation

## 2012-09-21 DIAGNOSIS — I340 Nonrheumatic mitral (valve) insufficiency: Secondary | ICD-10-CM

## 2012-09-21 DIAGNOSIS — I359 Nonrheumatic aortic valve disorder, unspecified: Secondary | ICD-10-CM

## 2012-09-21 DIAGNOSIS — E119 Type 2 diabetes mellitus without complications: Secondary | ICD-10-CM | POA: Insufficient documentation

## 2012-09-21 DIAGNOSIS — I2589 Other forms of chronic ischemic heart disease: Secondary | ICD-10-CM | POA: Insufficient documentation

## 2012-09-21 DIAGNOSIS — R0989 Other specified symptoms and signs involving the circulatory and respiratory systems: Secondary | ICD-10-CM

## 2012-09-21 DIAGNOSIS — I2510 Atherosclerotic heart disease of native coronary artery without angina pectoris: Secondary | ICD-10-CM

## 2012-09-21 DIAGNOSIS — E785 Hyperlipidemia, unspecified: Secondary | ICD-10-CM | POA: Insufficient documentation

## 2012-09-21 DIAGNOSIS — I701 Atherosclerosis of renal artery: Secondary | ICD-10-CM | POA: Insufficient documentation

## 2012-09-21 DIAGNOSIS — I6350 Cerebral infarction due to unspecified occlusion or stenosis of unspecified cerebral artery: Secondary | ICD-10-CM

## 2012-09-21 DIAGNOSIS — I059 Rheumatic mitral valve disease, unspecified: Secondary | ICD-10-CM

## 2012-09-21 DIAGNOSIS — Z7901 Long term (current) use of anticoagulants: Secondary | ICD-10-CM

## 2012-09-21 DIAGNOSIS — I739 Peripheral vascular disease, unspecified: Secondary | ICD-10-CM | POA: Insufficient documentation

## 2012-09-21 DIAGNOSIS — I080 Rheumatic disorders of both mitral and aortic valves: Secondary | ICD-10-CM | POA: Insufficient documentation

## 2012-09-21 LAB — LDL CHOLESTEROL, DIRECT: Direct LDL: 186.1 mg/dL

## 2012-09-21 LAB — BASIC METABOLIC PANEL
Calcium: 8.8 mg/dL (ref 8.4–10.5)
GFR: 48.43 mL/min — ABNORMAL LOW (ref >60.00)
Potassium: 4.4 mEq/L (ref 3.5–5.1)
Sodium: 135 mEq/L (ref 135–145)

## 2012-09-21 LAB — CBC WITH DIFFERENTIAL/PLATELET
Basophils Relative: 1.2 % (ref 0.0–3.0)
Eosinophils Relative: 1.9 % (ref 0.0–5.0)
Hemoglobin: 12.6 g/dL — ABNORMAL LOW (ref 13.0–17.0)
Lymphocytes Relative: 11.6 % — ABNORMAL LOW (ref 12.0–46.0)
Monocytes Relative: 11.5 % (ref 3.0–12.0)
Neutro Abs: 4.6 10*3/uL (ref 1.4–7.7)
RBC: 4.58 Mil/uL (ref 4.22–5.81)

## 2012-09-21 LAB — HEPATIC FUNCTION PANEL
ALT: 20 U/L (ref 0–53)
Albumin: 3.8 g/dL (ref 3.5–5.2)
Bilirubin, Direct: 0.1 mg/dL (ref 0.0–0.3)
Total Protein: 7.4 g/dL (ref 6.0–8.3)

## 2012-09-21 LAB — LIPID PANEL
Cholesterol: 239 mg/dL — ABNORMAL HIGH (ref 0–200)
HDL: 45.2 mg/dL (ref >39.00)
Triglycerides: 99 mg/dL (ref 0.0–149.0)
VLDL: 19.8 mg/dL (ref 0.0–40.0)

## 2012-09-21 LAB — POCT INR: INR: 2.5

## 2012-09-21 NOTE — Progress Notes (Signed)
Echocardiogram performed.  

## 2012-10-05 ENCOUNTER — Other Ambulatory Visit: Payer: Self-pay | Admitting: Cardiology

## 2012-10-23 ENCOUNTER — Encounter: Payer: Self-pay | Admitting: *Deleted

## 2012-11-02 ENCOUNTER — Ambulatory Visit (INDEPENDENT_AMBULATORY_CARE_PROVIDER_SITE_OTHER): Payer: 59 | Admitting: *Deleted

## 2012-11-02 DIAGNOSIS — Z7901 Long term (current) use of anticoagulants: Secondary | ICD-10-CM

## 2012-11-02 DIAGNOSIS — I6350 Cerebral infarction due to unspecified occlusion or stenosis of unspecified cerebral artery: Secondary | ICD-10-CM

## 2012-11-02 DIAGNOSIS — I059 Rheumatic mitral valve disease, unspecified: Secondary | ICD-10-CM

## 2012-11-02 DIAGNOSIS — I340 Nonrheumatic mitral (valve) insufficiency: Secondary | ICD-10-CM

## 2012-11-02 LAB — POCT INR: INR: 2.4

## 2013-02-11 ENCOUNTER — Other Ambulatory Visit: Payer: Self-pay | Admitting: Cardiology

## 2013-02-20 ENCOUNTER — Telehealth: Payer: Self-pay | Admitting: *Deleted

## 2013-02-20 NOTE — Telephone Encounter (Signed)
Rx for Warfarin

## 2013-02-21 NOTE — Telephone Encounter (Signed)
Left message that pt needs to call coumadin clinic regarding refill for coumadin as he has not been seen since May

## 2013-02-27 ENCOUNTER — Ambulatory Visit (INDEPENDENT_AMBULATORY_CARE_PROVIDER_SITE_OTHER): Payer: Medicare HMO | Admitting: *Deleted

## 2013-02-27 DIAGNOSIS — Z7901 Long term (current) use of anticoagulants: Secondary | ICD-10-CM

## 2013-02-27 DIAGNOSIS — I340 Nonrheumatic mitral (valve) insufficiency: Secondary | ICD-10-CM

## 2013-02-27 DIAGNOSIS — I6350 Cerebral infarction due to unspecified occlusion or stenosis of unspecified cerebral artery: Secondary | ICD-10-CM

## 2013-02-27 DIAGNOSIS — I059 Rheumatic mitral valve disease, unspecified: Secondary | ICD-10-CM

## 2013-02-27 LAB — POCT INR: INR: 1.2

## 2013-02-27 MED ORDER — WARFARIN SODIUM 6 MG PO TABS: ORAL_TABLET | ORAL | Status: DC

## 2013-03-06 ENCOUNTER — Ambulatory Visit (INDEPENDENT_AMBULATORY_CARE_PROVIDER_SITE_OTHER): Payer: Medicare HMO | Admitting: *Deleted

## 2013-03-06 DIAGNOSIS — Z7901 Long term (current) use of anticoagulants: Secondary | ICD-10-CM

## 2013-03-06 DIAGNOSIS — I059 Rheumatic mitral valve disease, unspecified: Secondary | ICD-10-CM

## 2013-03-06 DIAGNOSIS — I340 Nonrheumatic mitral (valve) insufficiency: Secondary | ICD-10-CM

## 2013-03-06 DIAGNOSIS — I6350 Cerebral infarction due to unspecified occlusion or stenosis of unspecified cerebral artery: Secondary | ICD-10-CM

## 2013-03-20 ENCOUNTER — Ambulatory Visit (INDEPENDENT_AMBULATORY_CARE_PROVIDER_SITE_OTHER): Payer: Medicare HMO | Admitting: *Deleted

## 2013-03-20 DIAGNOSIS — I059 Rheumatic mitral valve disease, unspecified: Secondary | ICD-10-CM

## 2013-03-20 DIAGNOSIS — I6350 Cerebral infarction due to unspecified occlusion or stenosis of unspecified cerebral artery: Secondary | ICD-10-CM

## 2013-03-20 DIAGNOSIS — I340 Nonrheumatic mitral (valve) insufficiency: Secondary | ICD-10-CM

## 2013-03-20 DIAGNOSIS — Z7901 Long term (current) use of anticoagulants: Secondary | ICD-10-CM

## 2013-04-17 ENCOUNTER — Ambulatory Visit (INDEPENDENT_AMBULATORY_CARE_PROVIDER_SITE_OTHER): Payer: Medicare HMO | Admitting: *Deleted

## 2013-04-17 DIAGNOSIS — Z7901 Long term (current) use of anticoagulants: Secondary | ICD-10-CM

## 2013-04-17 DIAGNOSIS — I059 Rheumatic mitral valve disease, unspecified: Secondary | ICD-10-CM

## 2013-04-17 DIAGNOSIS — I6350 Cerebral infarction due to unspecified occlusion or stenosis of unspecified cerebral artery: Secondary | ICD-10-CM

## 2013-04-17 DIAGNOSIS — I340 Nonrheumatic mitral (valve) insufficiency: Secondary | ICD-10-CM

## 2013-04-17 LAB — POCT INR: INR: 3.3

## 2013-05-09 ENCOUNTER — Other Ambulatory Visit: Payer: Self-pay | Admitting: Cardiology

## 2013-05-22 ENCOUNTER — Ambulatory Visit (INDEPENDENT_AMBULATORY_CARE_PROVIDER_SITE_OTHER): Payer: Medicare HMO | Admitting: Pharmacist

## 2013-05-22 DIAGNOSIS — I6350 Cerebral infarction due to unspecified occlusion or stenosis of unspecified cerebral artery: Secondary | ICD-10-CM

## 2013-05-22 DIAGNOSIS — I340 Nonrheumatic mitral (valve) insufficiency: Secondary | ICD-10-CM

## 2013-05-22 DIAGNOSIS — I059 Rheumatic mitral valve disease, unspecified: Secondary | ICD-10-CM

## 2013-05-22 DIAGNOSIS — Z7901 Long term (current) use of anticoagulants: Secondary | ICD-10-CM

## 2013-06-05 ENCOUNTER — Emergency Department (HOSPITAL_COMMUNITY): Payer: Medicare HMO

## 2013-06-05 ENCOUNTER — Inpatient Hospital Stay (HOSPITAL_COMMUNITY)
Admission: EM | Admit: 2013-06-05 | Discharge: 2013-06-12 | DRG: 293 | Disposition: A | Discharge status: Home Health | Payer: Medicare HMO | Attending: Cardiology | Admitting: Cardiology

## 2013-06-05 ENCOUNTER — Encounter (HOSPITAL_COMMUNITY): Payer: Self-pay | Admitting: Emergency Medicine

## 2013-06-05 DIAGNOSIS — I6529 Occlusion and stenosis of unspecified carotid artery: Secondary | ICD-10-CM | POA: Diagnosis present

## 2013-06-05 DIAGNOSIS — I6350 Cerebral infarction due to unspecified occlusion or stenosis of unspecified cerebral artery: Secondary | ICD-10-CM | POA: Diagnosis present

## 2013-06-05 DIAGNOSIS — Z79899 Other long term (current) drug therapy: Secondary | ICD-10-CM

## 2013-06-05 DIAGNOSIS — I447 Left bundle-branch block, unspecified: Secondary | ICD-10-CM | POA: Diagnosis present

## 2013-06-05 DIAGNOSIS — I5023 Acute on chronic systolic (congestive) heart failure: Principal | ICD-10-CM | POA: Diagnosis present

## 2013-06-05 DIAGNOSIS — I2510 Atherosclerotic heart disease of native coronary artery without angina pectoris: Secondary | ICD-10-CM | POA: Diagnosis present

## 2013-06-05 DIAGNOSIS — I340 Nonrheumatic mitral (valve) insufficiency: Secondary | ICD-10-CM

## 2013-06-05 DIAGNOSIS — Z8673 Personal history of transient ischemic attack (TIA), and cerebral infarction without residual deficits: Secondary | ICD-10-CM

## 2013-06-05 DIAGNOSIS — I2589 Other forms of chronic ischemic heart disease: Secondary | ICD-10-CM | POA: Diagnosis present

## 2013-06-05 DIAGNOSIS — E782 Mixed hyperlipidemia: Secondary | ICD-10-CM | POA: Diagnosis present

## 2013-06-05 DIAGNOSIS — E785 Hyperlipidemia, unspecified: Secondary | ICD-10-CM | POA: Diagnosis present

## 2013-06-05 DIAGNOSIS — F17200 Nicotine dependence, unspecified, uncomplicated: Secondary | ICD-10-CM | POA: Diagnosis present

## 2013-06-05 DIAGNOSIS — I10 Essential (primary) hypertension: Secondary | ICD-10-CM | POA: Diagnosis present

## 2013-06-05 DIAGNOSIS — Z7901 Long term (current) use of anticoagulants: Secondary | ICD-10-CM

## 2013-06-05 DIAGNOSIS — I359 Nonrheumatic aortic valve disorder, unspecified: Secondary | ICD-10-CM | POA: Diagnosis present

## 2013-06-05 DIAGNOSIS — E039 Hypothyroidism, unspecified: Secondary | ICD-10-CM | POA: Diagnosis present

## 2013-06-05 DIAGNOSIS — N183 Chronic kidney disease, stage 3 unspecified: Secondary | ICD-10-CM | POA: Diagnosis present

## 2013-06-05 DIAGNOSIS — Z8249 Family history of ischemic heart disease and other diseases of the circulatory system: Secondary | ICD-10-CM

## 2013-06-05 DIAGNOSIS — Z951 Presence of aortocoronary bypass graft: Secondary | ICD-10-CM

## 2013-06-05 DIAGNOSIS — I701 Atherosclerosis of renal artery: Secondary | ICD-10-CM | POA: Diagnosis present

## 2013-06-05 DIAGNOSIS — I739 Peripheral vascular disease, unspecified: Secondary | ICD-10-CM | POA: Diagnosis present

## 2013-06-05 DIAGNOSIS — I129 Hypertensive chronic kidney disease with stage 1 through stage 4 chronic kidney disease, or unspecified chronic kidney disease: Secondary | ICD-10-CM | POA: Diagnosis present

## 2013-06-05 DIAGNOSIS — I25810 Atherosclerosis of coronary artery bypass graft(s) without angina pectoris: Secondary | ICD-10-CM | POA: Diagnosis present

## 2013-06-05 DIAGNOSIS — I509 Heart failure, unspecified: Secondary | ICD-10-CM | POA: Diagnosis present

## 2013-06-05 HISTORY — DX: Chronic kidney disease, stage 3 (moderate): N18.3

## 2013-06-05 HISTORY — DX: Chronic kidney disease, stage 3 unspecified: N18.30

## 2013-06-05 HISTORY — DX: Chronic systolic (congestive) heart failure: I50.22

## 2013-06-05 HISTORY — DX: Long term (current) use of anticoagulants: Z79.01

## 2013-06-05 HISTORY — DX: Hypothyroidism, unspecified: E03.9

## 2013-06-05 HISTORY — DX: Tobacco use: Z72.0

## 2013-06-05 HISTORY — DX: Nonrheumatic aortic (valve) stenosis: I35.0

## 2013-06-05 LAB — CBC WITH DIFFERENTIAL/PLATELET
Basophils Absolute: 0 10*3/uL (ref 0.0–0.1)
Basophils Relative: 0 % (ref 0–1)
Hemoglobin: 13.2 g/dL (ref 13.0–17.0)
Lymphs Abs: 0.6 10*3/uL — ABNORMAL LOW (ref 0.7–4.0)
MCHC: 32.4 g/dL (ref 30.0–36.0)
Monocytes Relative: 9 % (ref 3–12)
Neutro Abs: 6.4 10*3/uL (ref 1.7–7.7)
Neutrophils Relative %: 83 % — ABNORMAL HIGH (ref 43–77)

## 2013-06-05 LAB — COMPREHENSIVE METABOLIC PANEL
ALT: 23 U/L (ref 0–53)
AST: 28 U/L (ref 0–37)
Albumin: 3.6 g/dL (ref 3.5–5.2)
Alkaline Phosphatase: 176 U/L — ABNORMAL HIGH (ref 39–117)
BUN: 31 mg/dL — ABNORMAL HIGH (ref 6–23)
Chloride: 101 mEq/L (ref 96–112)
GFR calc non Af Amer: 42 mL/min — ABNORMAL LOW (ref >90)
Potassium: 4.4 mEq/L (ref 3.5–5.1)
Total Bilirubin: 1.1 mg/dL (ref 0.3–1.2)

## 2013-06-05 LAB — MRSA PCR SCREENING: MRSA by PCR: NEGATIVE

## 2013-06-05 LAB — PROTIME-INR: Prothrombin Time: 28.7 seconds — ABNORMAL HIGH (ref 11.6–15.2)

## 2013-06-05 LAB — POCT I-STAT TROPONIN I: Troponin i, poc: 0.06 ng/mL (ref 0.00–0.08)

## 2013-06-05 LAB — LIPASE, BLOOD: Lipase: 92 U/L — ABNORMAL HIGH (ref 11–59)

## 2013-06-05 LAB — D-DIMER, QUANTITATIVE (NOT AT ARMC): D-Dimer, Quant: 2.74 ug/mL-FEU — ABNORMAL HIGH (ref 0.00–0.48)

## 2013-06-05 MED ORDER — AMLODIPINE BESYLATE 10 MG PO TABS
10.0000 mg | ORAL_TABLET | Freq: Every day | ORAL | Status: DC
Start: 2013-06-06 — End: 2013-06-12
  Administered 2013-06-06 – 2013-06-11 (×6): 10 mg via ORAL
  Filled 2013-06-05 (×7): qty 1

## 2013-06-05 MED ORDER — ATORVASTATIN CALCIUM 80 MG PO TABS
80.0000 mg | ORAL_TABLET | Freq: Every day | ORAL | Status: DC
  Administered 2013-06-05 – 2013-06-11 (×7): 80 mg via ORAL
  Filled 2013-06-05 (×8): qty 1

## 2013-06-05 MED ORDER — CARVEDILOL 3.125 MG PO TABS
3.1250 mg | ORAL_TABLET | Freq: Two times a day (BID) | ORAL | Status: DC
  Administered 2013-06-05 – 2013-06-10 (×10): 3.125 mg via ORAL
  Filled 2013-06-05 (×12): qty 1

## 2013-06-05 MED ORDER — LOSARTAN POTASSIUM 50 MG PO TABS
100.0000 mg | ORAL_TABLET | Freq: Every day | ORAL | Status: DC
  Administered 2013-06-06 – 2013-06-12 (×7): 100 mg via ORAL
  Filled 2013-06-05 (×7): qty 2

## 2013-06-05 MED ORDER — EZETIMIBE 10 MG PO TABS
10.0000 mg | ORAL_TABLET | Freq: Every day | ORAL | Status: DC
  Administered 2013-06-06 – 2013-06-12 (×7): 10 mg via ORAL
  Filled 2013-06-05 (×7): qty 1

## 2013-06-05 MED ORDER — HYDRALAZINE HCL 50 MG PO TABS
100.0000 mg | ORAL_TABLET | Freq: Three times a day (TID) | ORAL | Status: DC
  Administered 2013-06-05 – 2013-06-09 (×13): 100 mg via ORAL
  Filled 2013-06-05 (×17): qty 2

## 2013-06-05 MED ORDER — ALBUTEROL SULFATE (5 MG/ML) 0.5% IN NEBU
5.0000 mg | INHALATION_SOLUTION | Freq: Once | RESPIRATORY_TRACT | Status: AC
  Administered 2013-06-05: 5 mg via RESPIRATORY_TRACT
  Filled 2013-06-05: qty 1

## 2013-06-05 MED ORDER — NITROGLYCERIN IN D5W 200-5 MCG/ML-% IV SOLN
2.0000 ug/min | INTRAVENOUS | Status: DC
  Administered 2013-06-05: 20 ug/min via INTRAVENOUS
  Filled 2013-06-05: qty 250

## 2013-06-05 MED ORDER — ASPIRIN EC 81 MG PO TBEC
81.0000 mg | DELAYED_RELEASE_TABLET | Freq: Every day | ORAL | Status: DC
  Administered 2013-06-05 – 2013-06-10 (×6): 81 mg via ORAL
  Filled 2013-06-05 (×7): qty 1

## 2013-06-05 MED ORDER — SODIUM CHLORIDE 0.9 % IV SOLN
250.0000 mL | INTRAVENOUS | Status: DC | PRN
  Administered 2013-06-05: 250 mL via INTRAVENOUS

## 2013-06-05 MED ORDER — FUROSEMIDE 10 MG/ML IJ SOLN
80.0000 mg | Freq: Two times a day (BID) | INTRAMUSCULAR | Status: DC
  Administered 2013-06-05 – 2013-06-08 (×5): 80 mg via INTRAVENOUS
  Filled 2013-06-05 (×7): qty 8

## 2013-06-05 MED ORDER — ONDANSETRON HCL 4 MG/2ML IJ SOLN: 4.0000 mg | Freq: Four times a day (QID) | INTRAMUSCULAR | Status: DC | PRN

## 2013-06-05 MED ORDER — SODIUM CHLORIDE 0.9 % IJ SOLN
3.0000 mL | Freq: Two times a day (BID) | INTRAMUSCULAR | Status: DC
  Administered 2013-06-05 (×2): 3 mL via INTRAVENOUS
  Administered 2013-06-07: 10:00:00 via INTRAVENOUS
  Administered 2013-06-07 – 2013-06-12 (×9): 3 mL via INTRAVENOUS

## 2013-06-05 MED ORDER — FUROSEMIDE 10 MG/ML IJ SOLN
60.0000 mg | Freq: Once | INTRAMUSCULAR | Status: AC
  Administered 2013-06-05: 60 mg via INTRAVENOUS
  Filled 2013-06-05: qty 6

## 2013-06-05 MED ORDER — SODIUM CHLORIDE 0.9 % IJ SOLN: 3.0000 mL | INTRAMUSCULAR | Status: DC | PRN

## 2013-06-05 MED ORDER — ACETAMINOPHEN 325 MG PO TABS
650.0000 mg | ORAL_TABLET | ORAL | Status: DC | PRN
  Administered 2013-06-11: 650 mg via ORAL
  Filled 2013-06-05 (×2): qty 2

## 2013-06-05 MED ORDER — GUAIFENESIN ER 600 MG PO TB12
600.0000 mg | ORAL_TABLET | Freq: Two times a day (BID) | ORAL | Status: DC | PRN
  Administered 2013-06-05 – 2013-06-11 (×4): 600 mg via ORAL
  Filled 2013-06-05 (×4): qty 1

## 2013-06-05 MED ORDER — TRAZODONE 25 MG HALF TABLET
25.0000 mg | ORAL_TABLET | Freq: Once | ORAL | Status: AC
  Administered 2013-06-05: 25 mg via ORAL
  Filled 2013-06-05: qty 1

## 2013-06-05 NOTE — Progress Notes (Signed)
ANTICOAGULATION CONSULT NOTE - Initial Consult  Pharmacy Consult:  Heparin Indication:  ACS (when INR < 2)  Allergies  Allergen Reactions  . Codeine Phosphate Nausea Only    REACTION: unspecified    Patient Measurements: Height: 5\' 11"  (180.3 cm) Weight: 170 lb (77.111 kg) IBW/kg (Calculated) : 75.3 Heparin Dosing Weight: 77 kg  Vital Signs: Temp: 97.9 F (36.6 C) (12/10 1122) Temp src: Oral (12/10 1122) BP: 159/75 mmHg (12/10 1230) Pulse Rate: 83 (12/10 1230)  Labs:  Recent Labs  06/05/13 0838  HGB 13.2  HCT 40.8  PLT 183  LABPROT 28.7*  INR 2.82*  CREATININE 1.57*  TROPONINI 0.51*    Estimated Creatinine Clearance: 44.6 ml/min (by C-G formula based on Cr of 1.57).   Medical History: Past Medical History  Diagnosis Date  . Peripheral arterial disease     a. Ao-bifem bypass w/ 16x8 hemashield dacron graft, R aortorenal bypass w/ 6mm dacron graft;  b. 09/2011 ABI: R 0/87, L 0.92.  . Carotid artery occlusion     a. 12/2002 R CEA;  b. 05/2007 L Carotid stenting;  c. 09/2008 repeat L carotid stenting 2/2 ISR;  d. 09/2011 Carotid U/S: RICA 40-59%, LICA 60-79%, >50 dist LCCA stenosis.  Marland Kitchen CAD (coronary artery disease)     a. CABG 1985;  b. 05/2002 Rotablator/PTCA to ostial LCX and RI;  c. 12/2009 MV: inf and lat infarct w/ minimal mid lateral and basilar ant ischemia, EF 29%->Med Rx.  . Cardiomyopathy, ischemic     a. 06/2010 Cardiac MRI: EF 30%, anterolat, post, inf prior subendocardial infarcts;  b. 08/2012 Echo: EF 30-35%, mild LVH mult wma's, Gr 2 DD, mild AS, mod MR, mod dil LA, mild TR.  . Stroke     remote  . Hypertension   . Hyperlipidemia   . Tobacco abuse   . Anticoagulated on Coumadin   . CKD (chronic kidney disease), stage III        Assessment: 9 YOM admitted with a 2 day history of progressive dyspnea, orthopnea, and upper abdominal pain.  He was on Coumadin PTA and Pharmacy consulted to start IV heparin for ACS once INR is below 2.  INR is currently  therapeutic at 2.82.  No bleeding reported.   Goal of Therapy:  Heparin level 0.3-0.7 units/ml Monitor platelets by anticoagulation protocol: Yes    Plan:  - Daily PT / INR - F/U INR in AM and start IV heparin if INR < 2    Henry Demeritt D. Laney Potash, PharmD, BCPS Pager:  [phone] 06/05/2013, 2:05 PM

## 2013-06-05 NOTE — ED Notes (Signed)
MD at bedside - Dr. Allen 

## 2013-06-05 NOTE — ED Provider Notes (Signed)
CSN: [id number]     Arrival date & time 06/05/13  4098 History   First MD Initiated Contact with Patient 06/05/13 [phone]     Chief Complaint  Patient presents with  . Chest Pain  . Shortness of Breath   (Consider location/radiation/quality/duration/timing/severity/associated sxs/prior Treatment) Patient is a 72 y.o. male presenting with chest pain and shortness of breath. The history is provided by the patient.  Chest Pain Associated symptoms: shortness of breath   Shortness of Breath Associated symptoms: chest pain    patient here complaining of sudden onset of right-sided chest pain that began yesterday when he was walking. Pain has been persistent and worsening evenings when he lays flat. Has had associated dyspnea without diaphoresis. No fever or cough. Denies a leg pain or swelling. History of angina in the past but this is not similar. Denies any rashes. Pain radiates to his right upper quadrant and is not associated with eating.  Past Medical History  Diagnosis Date  . Peripheral arterial disease   . Carotid artery occlusion   . CAD (coronary artery disease)   . Cardiomyopathy, ischemic   . Stroke     remote  . Hypertension   . Hyperlipidemia   . Substance abuse     tobacco   Past Surgical History  Procedure Laterality Date  . Carotid endarterectomy    . Coronary artery bypass graft    . Pr vein bypass graft,aorto-fem-pop    . Ureteral artery renal bypass     No family history on file. History  Substance Use Topics  . Smoking status: Current Every Day Smoker -- 0.10 packs/day    Types: Cigarettes  . Smokeless tobacco: Never Used     Comment: pt states he smokes 2 cigs per day  . Alcohol Use: No    Review of Systems  Respiratory: Positive for shortness of breath.   Cardiovascular: Positive for chest pain.  All other systems reviewed and are negative.    Allergies  Codeine phosphate  Home Medications   Current Outpatient Rx  Name  Route  Sig  Dispense   Refill  . amLODipine (NORVASC) 10 MG tablet   Oral   Take 10 mg by mouth daily.           Marland Kitchen ezetimibe (ZETIA) 10 MG tablet   Oral   Take 10 mg by mouth daily.           . hydrALAZINE (APRESOLINE) 100 MG tablet      take 1 tablet by mouth three times a day   90 tablet   6   . hydrochlorothiazide 25 MG tablet   Oral   Take 25 mg by mouth daily.           Marland Kitchen losartan (COZAAR) 100 MG tablet   Oral   Take 100 mg by mouth daily.           . metoprolol tartrate (LOPRESSOR) 25 MG tablet   Oral   Take 25 mg by mouth 2 (two) times daily.         . rosuvastatin (CRESTOR) 40 MG tablet   Oral   Take 40 mg by mouth daily.           Marland Kitchen warfarin (COUMADIN) 6 MG tablet   Oral   Take 3-6 mg by mouth daily. Takes 3 mg on Saturday and 6 mg all other days          BP 116/94  Pulse 87  Temp(Src) 97.5  F (36.4 C) (Oral)  Resp 26  Ht 5\' 11"  (1.803 m)  Wt 170 lb (77.111 kg)  BMI 23.72 kg/m2  SpO2 96% Physical Exam  Nursing note and vitals reviewed. Constitutional: He is oriented to person, place, and time. He appears well-developed and well-nourished.  Non-toxic appearance. No distress.  HENT:  Head: Normocephalic and atraumatic.  Eyes: Conjunctivae, EOM and lids are normal. Pupils are equal, round, and reactive to light.  Neck: Normal range of motion. Neck supple. No tracheal deviation present. No mass present.  Cardiovascular: Normal rate, regular rhythm and normal heart sounds.  Exam reveals no gallop.   No murmur heard. Pulmonary/Chest: Effort normal. No stridor. No respiratory distress. He has decreased breath sounds. He has wheezes. He has no rhonchi. He has no rales.  Abdominal: Soft. Normal appearance and bowel sounds are normal. He exhibits no distension. There is tenderness in the right upper quadrant. There is no rigidity, no rebound, no guarding and no CVA tenderness.    Musculoskeletal: Normal range of motion. He exhibits no edema and no tenderness.    Neurological: He is alert and oriented to person, place, and time. He has normal strength. No cranial nerve deficit or sensory deficit. GCS eye subscore is 4. GCS verbal subscore is 5. GCS motor subscore is 6.  Skin: Skin is warm and dry. No abrasion and no rash noted.  Psychiatric: He has a normal mood and affect. His speech is normal and behavior is normal.    ED Course  Procedures (including critical care time) Labs Review Labs Reviewed  TROPONIN I  CBC WITH DIFFERENTIAL  COMPREHENSIVE METABOLIC PANEL  PROTIME-INR  PRO B NATRIURETIC PEPTIDE  D-DIMER, QUANTITATIVE   Imaging Review No results found.  Date: 06/05/2013  Rate: 98  Rhythm: normal sinus rhythm  QRS Axis: right  Intervals: normal  ST/T Wave abnormalities: nonspecific ST changes  Conduction Disutrbances:left bundle branch block  Narrative Interpretation:   Old EKG Reviewed: unchanged     MDM  No diagnosis found. Patient's chest x-ray consistent with CHF. Given Lasix IV push. Cardiology consultation and patient to be admitted    Toy Baker, MD 06/05/13 1114

## 2013-06-05 NOTE — ED Notes (Signed)
Cardiology consult at bedside.

## 2013-06-05 NOTE — H&P (Signed)
Patient ID: Clinton [name] MRN: [id number], DOB/AGE: 1941/06/04   Admit date: 06/05/2013  Primary Physician: Arlan Organ., MD Primary Cardiologist: B. Jens Som, MD   Pt. Profile:  72 y/o male with h/o CAD/PVD and ICM, who presented to the ED this AM with a 2 day h/o progressive dyspnea, orthopnea, and upper abdominal pain.  Problem List  Past Medical History  Diagnosis Date  . Peripheral arterial disease     a. Ao-bifem bypass w/ 16x8 hemashield dacron graft, R aortorenal bypass w/ 6mm dacron graft;  b. 09/2011 ABI: R 0/87, L 0.92.  . Carotid artery occlusion     a. 12/2002 R CEA;  b. 05/2007 L Carotid stenting;  c. 09/2008 repeat L carotid stenting 2/2 ISR;  d. 09/2011 Carotid U/S: RICA 40-59%, LICA 60-79%, >50 dist LCCA stenosis.  Marland Kitchen CAD (coronary artery disease)     a. CABG 1985;  b. 05/2002 Rotablator/PTCA to ostial LCX and RI;  c. 12/2009 MV: inf and lat infarct w/ minimal mid lateral and basilar ant ischemia, EF 29%->Med Rx.  . Cardiomyopathy, ischemic     a. 06/2010 Cardiac MRI: EF 30%, anterolat, post, inf prior subendocardial infarcts;  b. 08/2012 Echo: EF 30-35%, mild LVH mult wma's, Gr 2 DD, mild AS, mod MR, mod dil LA, mild TR.  . Stroke     remote  . Hypertension   . Hyperlipidemia   . Tobacco abuse   . Anticoagulated on Coumadin   . CKD (chronic kidney disease), stage III     Past Surgical History  Procedure Laterality Date  . Carotid endarterectomy    . Coronary artery bypass graft    . Pr vein bypass graft,aorto-fem-pop    . Ureteral artery renal bypass      Allergies  Allergies  Allergen Reactions  . Codeine Phosphate Nausea Only    REACTION: unspecified   HPI  72 y/o male with the above complex past medical history.  He is s/p remote CABG in 1985 and rotational atherectomy to the ostial LCX and RI in 05/2002.  His last myoview was in 2011, which was mildly ischemic.  He has been medically managed.  He also has a h/o ICM, with an EF of 30-35%.  He  has been evaluated by Dr. Johney Frame in the past for consideration of ICD placement, but has refused.  He lives with his son in [place], and says that overall he does pretty well, though he has significant bilat leg pain/claudication with ambulation over long distances or up inclines, for may years now.  He generally does not experience chest pain.  Beginning on Monday of this week, he began to experience progressive DOE and orthopnea.  He has also had a nonproductive cough but denies fever/chills.  Yesterday, he noted persistent dyspnea, including @ rest, and this became associated with upper abdominal discomfort.  He also noted LEE. This persisted throughout the day yesterday and last night he was very restless and orthopneic.  He presented to the ED this AM 2/2 ongoing Ss and was found to have a mildly elevated troponin with evidence of chf on both exam and cxr.  He continues to be tachypneic and c/o mild upper abdominal discomfort.  Home Medications  Prior to Admission medications   Medication Sig Start Date End Date Taking? Authorizing Provider  amLODipine (NORVASC) 10 MG tablet Take 10 mg by mouth daily.     Yes Historical Provider, MD  ezetimibe (ZETIA) 10 MG tablet Take 10 mg by  mouth daily.     Yes Historical Provider, MD  hydrALAZINE (APRESOLINE) 100 MG tablet take 1 tablet by mouth three times a day 10/05/12  Yes Lewayne Bunting, MD  hydrochlorothiazide 25 MG tablet Take 25 mg by mouth daily.     Yes Historical Provider, MD  losartan (COZAAR) 100 MG tablet Take 100 mg by mouth daily.     Yes Historical Provider, MD  metoprolol tartrate (LOPRESSOR) 25 MG tablet Take 25 mg by mouth 2 (two) times daily.   Yes Historical Provider, MD  rosuvastatin (CRESTOR) 40 MG tablet Take 40 mg by mouth daily.     Yes Historical Provider, MD  warfarin (COUMADIN) 6 MG tablet Take 3-6 mg by mouth daily. Takes 3 mg on Saturday and 6 mg all other days   Yes Historical Provider, MD    Family History  Family  History  Problem Relation Age of Onset  . CAD     Social History  History   Social History  . Marital Status: Divorced    Spouse Name: N/A    Number of Children: N/A  . Years of Education: N/A   Occupational History  . Not on file.   Social History Main Topics  . Smoking status: Current Every Day Smoker -- 0.10 packs/day for 50 years    Types: Cigarettes  . Smokeless tobacco: Never Used     Comment: pt states he smokes 2 cigs per day  . Alcohol Use: No  . Drug Use: No  . Sexual Activity: Not on file   Other Topics Concern  . Not on file   Social History Narrative   Lives in [place] with his son.  They eat out often and otherwise eat a lot of prepared/processed/microwave-type meals.  He continues to smoke a few cigarettes/day.    Review of Systems General:  No chills, fever, night sweats or weight changes.  Cardiovascular:  +++ upper abdominal pain and dyspnea, +++ LE edema since yesterday, +++ orthopnea, palpitations, paroxysmal nocturnal dyspnea. Dermatological: No rash, lesions/masses Respiratory: +++ cough, dyspnea Urologic: No hematuria, dysuria Abdominal:   +++ upper abd discomfort/bloating.  No nausea, vomiting, diarrhea, bright red blood per rectum, melena, or hematemesis Neurologic:  No visual changes, wkns, changes in mental status. All other systems reviewed and are otherwise negative except as noted above.  Physical Exam  Blood pressure 162/92, pulse 88, temperature 97.9 F (36.6 C), temperature source Oral, resp. rate 20, height 5\' 11"  (1.803 m), weight 170 lb (77.111 kg), SpO2 96.00%.  General: Pleasant, tachypneic. Psych: Normal affect. Neuro: Alert and oriented X 3. Moves all extremities spontaneously. HEENT: Normal  Neck: Supple with bilateral carotid bruits and jvd to jaw. Lungs:  tachypneic w/o use of accessory muscles.  Coarse breath sounds throughout with crackles ~ 1/2 way up bilat, exp wheezing anteriorally. Heart: RRR no s3, s4, 3/6 syst  murmur throughout, loudest @ the apex. Abdomen: Soft, non-tender, non-distended, BS + x 4.  Extremities: No clubbing, cyanosis, 2+ bilat LE edema to mid ankles. DP/PT/Radials 1+ and equal bilaterally.  Labs  Troponin [facility] Test)  Recent Labs  06/05/13 0851  TROPIPOC 0.06    Recent Labs  06/05/13 0838  TROPONINI 0.51*   Lab Results  Component Value Date   WBC 7.7 06/05/2013   HGB 13.2 06/05/2013   HCT 40.8 06/05/2013   MCV 84.8 06/05/2013   PLT 183 06/05/2013     Recent Labs Lab 06/05/13 0838  NA 137  K 4.4  CL 101  CO2 19  BUN 31*  CREATININE 1.57*  CALCIUM 9.2  PROT 7.7  BILITOT 1.1  ALKPHOS 176*  ALT 23  AST 28  GLUCOSE 103*   Lab Results  Component Value Date   CHOL 239* 09/21/2012   HDL 45.20 09/21/2012   LDLCALC 77 03/31/2010   TRIG 99.0 09/21/2012   Lab Results  Component Value Date   DDIMER 2.74* 06/05/2013    Radiology/Studies  Dg Chest 2 View  06/05/2013   CLINICAL DATA:  Chest pain with shortness of breath, cough, and chest congestion.  EXAM: CHEST  2 VIEW  COMPARISON:  Chest x-rays dated 01/22/2009  FINDINGS: There is new cardiomegaly with pulmonary vascular congestion and interstitial pulmonary edema and bilateral small pleural effusions, right greater than left. No acute osseous abnormality. Extensive Kerley B-lines bilaterally.  IMPRESSION: Congestive heart failure with interstitial pulmonary edema and bilateral pleural effusions.   Electronically Signed   By: Geanie Cooley M.D.   On: 06/05/2013 09:19   ECG  Rsr, 100, lbbb, lvh, inflat twi.  ASSESSMENT AND PLAN  1.  Acute on chronic systolic CHF/ICM:  Pt presents with a 3 day h/o progressive dyspnea, orthopnea, edema, and upper abdominal discomfort.  He has been found to have evidence of volume overload/chf by exam and cxr.  Troponin is also mildly elevated.  Admit and aggressively diurese.  Switch bb to coreg (watch for worsening of wheezing), cont arb and hydralazine, add IV  nitro for the time being with a plan to switch to an oral nitrate.  Repeat echo to reassess LV fxn and MR/AS.  2.  CAD:  He has been having upper abdominal discomfort since yesterday but no sscp.  Troponin is mildly elevated.  Suspect this is 2/2 #1, however will continue to cycle and hold coumadin in case he requires diagnostic catheterization.  Cont bb, statin, arb.  Adding nitrate and heparin (once INR <2).  Add ASA for the time being.  3.  CKD III:  Creat stable.  Follow closely in setting of diuresis.  Cont arb.  4.  Tob abuse:  Still smoking a few cigarettes/day.  He is not motivated to quit.  5.  Chronic coumadin:  In setting of prior cva.  Hold in setting of above.  Add heparin once INR<2.  Signed, Nicolasa Ducking, NP 06/05/2013, 12:40 PM As above, patient seen and examined; 72 yo male with PMH CAD s/p CABG, ICM (previously declined ICD), renal insuff, PVD, HTN and hyperlipidemia with CHF. Complains of progressive DOE for 4 weeks; 3 days of orthopnea and PND; also with pedal edema. Has had epigastric pain that increases with lying flat and improves with sitting up. No other chest pain. Exam c/w CHF; ECG sinus with LBBB. Chest xray pulmonary edema. Plan to admit and diurese with lasix 80 mg IV BID; follow renal function. Repeat echo (note significant MR on exam). Continue ARB and hydralazine; change lopressor to coreg. Patient counseled on DCing tobacco. Hold coumadin for now. Olga Millers 12:55 PM

## 2013-06-05 NOTE — Care Management Note (Addendum)
    Page 1 of 1   06/07/2013     11:02:57 AM   CARE MANAGEMENT NOTE 06/07/2013  Patient:  Clinton [name], Clinton [name]   Account Number:  [id number]  Date Initiated:  06/05/2013  Documentation initiated by:  Junius Creamer  Subjective/Objective Assessment:   adm w heart fialure     Action/Plan:   lives w son, pcp dr Fayrene Fearing [place]   Anticipated DC Date:     Anticipated DC Plan:  HOME W HOME HEALTH SERVICES      DC Planning Services  CM consult      Choice offered to / List presented to:          [facility] arranged  HH - 11 Patient Refused      Status of service:   Medicare Important Message given?   (If response is "NO", the following Medicare IM given date fields will be blank) Date Medicare IM given:   Date Additional Medicare IM given:    Discharge Disposition:    Per UR Regulation:  Reviewed for med. necessity/level of care/duration of stay  If discussed at Long Length of Stay Meetings, dates discussed:    Comments:  12/12  1101a debbie Ardel Jagger rn,bsn spoke w pt. he follows up closely w md in office. he is not interested in hhc. will cont to moniter.  12/10 1344 debbie Murl Zogg rn,bsn adm w heart failure. chart states lives w son. they eat out alot or use [employer] meals. may benefit from hhc at disch. will follow and assist as needed.

## 2013-06-06 DIAGNOSIS — I509 Heart failure, unspecified: Secondary | ICD-10-CM

## 2013-06-06 DIAGNOSIS — I369 Nonrheumatic tricuspid valve disorder, unspecified: Secondary | ICD-10-CM

## 2013-06-06 LAB — CBC WITH DIFFERENTIAL/PLATELET
Basophils Absolute: 0 10*3/uL (ref 0.0–0.1)
Basophils Relative: 0 % (ref 0–1)
HCT: 35.1 % — ABNORMAL LOW (ref 39.0–52.0)
Hemoglobin: 11.6 g/dL — ABNORMAL LOW (ref 13.0–17.0)
Lymphocytes Relative: 7 % — ABNORMAL LOW (ref 12–46)
Lymphs Abs: 0.5 10*3/uL — ABNORMAL LOW (ref 0.7–4.0)
MCV: 85.6 fL (ref 78.0–100.0)
Monocytes Absolute: 1 10*3/uL (ref 0.1–1.0)
Neutro Abs: 6 10*3/uL (ref 1.7–7.7)
RBC: 4.1 MIL/uL — ABNORMAL LOW (ref 4.22–5.81)
RDW: 15.2 % (ref 11.5–15.5)
WBC: 7.6 10*3/uL (ref 4.0–10.5)

## 2013-06-06 LAB — BASIC METABOLIC PANEL
BUN: 37 mg/dL — ABNORMAL HIGH (ref 6–23)
CO2: 23 mEq/L (ref 19–32)
Chloride: 103 mEq/L (ref 96–112)
GFR calc Af Amer: 38 mL/min — ABNORMAL LOW (ref >90)
Potassium: 4.2 mEq/L (ref 3.5–5.1)
Sodium: 137 mEq/L (ref 135–145)

## 2013-06-06 LAB — TROPONIN I: Troponin I: 0.69 ng/mL (ref <0.30)

## 2013-06-06 LAB — PROTIME-INR: INR: 3.98 — ABNORMAL HIGH (ref 0.00–1.49)

## 2013-06-06 MED ORDER — ISOSORBIDE MONONITRATE ER 30 MG PO TB24
30.0000 mg | ORAL_TABLET | Freq: Every day | ORAL | Status: DC
  Administered 2013-06-06 – 2013-06-12 (×7): 30 mg via ORAL
  Filled 2013-06-06 (×7): qty 1

## 2013-06-06 MED ORDER — LEVOTHYROXINE SODIUM 50 MCG PO TABS
50.0000 ug | ORAL_TABLET | Freq: Every day | ORAL | Status: DC
  Administered 2013-06-06 – 2013-06-12 (×7): 50 ug via ORAL
  Filled 2013-06-06 (×8): qty 1

## 2013-06-06 NOTE — Progress Notes (Signed)
Echocardiogram 2D Echocardiogram has been performed.  Clinton [name] 06/06/2013, 8:00 AM

## 2013-06-06 NOTE — Progress Notes (Addendum)
    Subjective:  Denies CP; dyspnea mildly improved   Objective:  Filed Vitals:   06/06/13 0100 06/06/13 0200 06/06/13 0300 06/06/13 0400  BP: 138/71 161/60 173/72 113/63  Pulse: 70 76 74 64  Temp:    98.4 F (36.9 C)  TempSrc:    Oral  Resp: 27 25 25 17   Height:      Weight:    157 lb 3 oz (71.3 kg)  SpO2: 97% 89% 96% 94%    Intake/Output from previous day:  Intake/Output Summary (Last 24 hours) at 06/06/13 0813 Last data filed at 06/06/13 0400  Gross per 24 hour  Intake 586.63 ml  Output   3285 ml  Net -2698.37 ml    Physical Exam: Physical exam: Well-developed frail in no acute distress.  Skin is warm and dry.  HEENT is normal.  Neck is supple.  Chest with diffuse rhonchi and decreased BS bases Cardiovascular exam is regular rate and rhythm. 2/6 systolic murmur Abdominal exam nontender or distended. No masses palpated. Extremities show 1+ edema. neuro grossly intact    Lab Results: Basic Metabolic Panel:  Recent Labs  21/30/86 0838 06/06/13 0150  NA 137 137  K 4.4 4.2  CL 101 103  CO2 19 23  GLUCOSE 103* 91  BUN 31* 37*  CREATININE 1.57* 1.92*  CALCIUM 9.2 8.5   CBC:  Recent Labs  06/05/13 0838 06/06/13 0150  WBC 7.7 7.6  NEUTROABS 6.4 6.0  HGB 13.2 11.6*  HCT 40.8 35.1*  MCV 84.8 85.6  PLT 183 162   Cardiac Enzymes:  Recent Labs  06/05/13 1510 06/05/13 2050 06/06/13 0150  TROPONINI 0.41* 0.32* 0.69*     Assessment/Plan:  1. Acute on chronic systolic CHF/ICM: Pt presents with a 3 day h/o progressive dyspnea, orthopnea, edema, and upper abdominal discomfort. He has been found to have evidence of volume overload/chf by exam and cxr. Continue diuresis and follow renal function. Continue coreg, arb and hydralazine; change IV NTG to imdur. Repeat echo to reassess LV fxn and MR/AS.  2. CAD: He has been having upper abdominal discomfort since yesterday but no sscp. Troponin is mildly elevated. Suspect this is 2/2 #1 and renal insuff.  Cont bb, statin, arb and ASA.  3. CKD III: Creat stable. Follow closely in setting of diuresis. Cont arb.  4. Tob abuse: Still smoking a few cigarettes/day. He is not motivated to quit.  5. Chronic coumadin: In setting of prior cva. Hold in setting of above.  6. Markedly elevated TSH-begin synthroid and will need fu with primary care.   Clinton [name] 06/06/2013, 8:13 AM

## 2013-06-07 LAB — PROTIME-INR
INR: 2.59 — ABNORMAL HIGH (ref 0.00–1.49)
Prothrombin Time: 26.9 seconds — ABNORMAL HIGH (ref 11.6–15.2)

## 2013-06-07 LAB — BASIC METABOLIC PANEL
BUN: 41 mg/dL — ABNORMAL HIGH (ref 6–23)
Calcium: 8.4 mg/dL (ref 8.4–10.5)
Chloride: 102 mEq/L (ref 96–112)
GFR calc Af Amer: 33 mL/min — ABNORMAL LOW (ref >90)
Glucose, Bld: 88 mg/dL (ref 70–99)
Potassium: 4.1 mEq/L (ref 3.5–5.1)
Sodium: 136 mEq/L (ref 135–145)

## 2013-06-07 NOTE — Progress Notes (Signed)
    Subjective:  Denies CP; dyspnea persists; complains of nonproductive cough   Objective:  Filed Vitals:   06/06/13 2148 06/06/13 2353 06/07/13 0400 06/07/13 0500  BP: 109/53 103/47 127/56   Pulse:      Temp:  97.8 F (36.6 C)    TempSrc:  Oral    Resp:  17 17   Height:      Weight:    156 lb 4.9 oz (70.9 kg)  SpO2:  98% 96%     Intake/Output from previous day:  Intake/Output Summary (Last 24 hours) at 06/07/13 0719 Last data filed at 06/07/13 0500  Gross per 24 hour  Intake    612 ml  Output   1575 ml  Net   -963 ml    Physical Exam: Physical exam: Well-developed frail in no acute distress.  Skin is warm and dry.  HEENT is normal.  Neck is supple.  Chest with diffuse rhonchi, exp wheeze and decreased BS bases Cardiovascular exam is regular rate and rhythm. 2/6 systolic murmur Abdominal exam nontender or distended. No masses palpated. Extremities show trace to 1+ edema. neuro grossly intact    Lab Results: Basic Metabolic Panel:  Recent Labs  16/10/96 0150 06/07/13 0511  NA 137 136  K 4.2 4.1  CL 103 102  CO2 23 24  GLUCOSE 91 88  BUN 37* 41*  CREATININE 1.92* 2.16*  CALCIUM 8.5 8.4  MG  --  2.1   CBC:  Recent Labs  06/05/13 0838 06/06/13 0150  WBC 7.7 7.6  NEUTROABS 6.4 6.0  HGB 13.2 11.6*  HCT 40.8 35.1*  MCV 84.8 85.6  PLT 183 162   Cardiac Enzymes:  Recent Labs  06/05/13 1510 06/05/13 2050 06/06/13 0150  TROPONINI 0.41* 0.32* 0.69*   Telemetry-sinus with NSVT  Assessment/Plan:  1. Acute on chronic systolic CHF/ICM: Pt presents with a 3 day h/o progressive dyspnea, orthopnea, edema, and upper abdominal discomfort. He has been found to have evidence of volume overload/chf by exam and cxr. Continue diuresis and follow renal function. Continue coreg, arb and hydralazine/nitrates. Echo shows severe LV dysfunction and probable moderate AS.  2. CAD: He has been having upper abdominal discomfort since yesterday but no sscp. Troponin  is mildly elevated. Suspect this is 2/2 #1 and renal insuff. Cont bb, statin, arb and ASA. No plans for further ischemia eval. 3. CKD III: Follow closely in setting of diuresis. Cont arb for now.  4. Tob abuse: Still smoking a few cigarettes/day. He is not motivated to quit.  5. Chronic coumadin: In setting of prior cva. Hold in setting of above.  6. Markedly elevated TSH-synthroid initiated at 50 mcg daily and will need fu with primary care.   Clinton [name] 06/07/2013, 7:19 AM

## 2013-06-07 NOTE — Progress Notes (Signed)
Patient with 9 beat run of V-tach. Had an 8 beat run of V-tach at approximately 2331 (06/06/2013). Dr. Adolm Joseph paged and notified of the occurences. New order to add Magnesium to a.m. Labs. Patient asymptomatic. Resting in bed with eyes closed. Will continue to monitor.

## 2013-06-08 DIAGNOSIS — F17200 Nicotine dependence, unspecified, uncomplicated: Secondary | ICD-10-CM

## 2013-06-08 DIAGNOSIS — I25810 Atherosclerosis of coronary artery bypass graft(s) without angina pectoris: Secondary | ICD-10-CM

## 2013-06-08 LAB — BASIC METABOLIC PANEL
CO2: 28 mEq/L (ref 19–32)
Calcium: 8.3 mg/dL — ABNORMAL LOW (ref 8.4–10.5)
Chloride: 100 mEq/L (ref 96–112)
Creatinine, Ser: 2.27 mg/dL — ABNORMAL HIGH (ref 0.50–1.35)
GFR calc Af Amer: 31 mL/min — ABNORMAL LOW (ref >90)
Potassium: 4.5 mEq/L (ref 3.5–5.1)
Sodium: 138 mEq/L (ref 135–145)

## 2013-06-08 LAB — PROTIME-INR
INR: 3.23 — ABNORMAL HIGH (ref 0.00–1.49)
Prothrombin Time: 31.8 seconds — ABNORMAL HIGH (ref 11.6–15.2)

## 2013-06-08 MED ORDER — FUROSEMIDE 10 MG/ML IJ SOLN
80.0000 mg | Freq: Every day | INTRAMUSCULAR | Status: DC
  Administered 2013-06-09: 80 mg via INTRAVENOUS
  Filled 2013-06-08 (×2): qty 8

## 2013-06-08 MED ORDER — ZOLPIDEM TARTRATE 5 MG PO TABS
5.0000 mg | ORAL_TABLET | Freq: Every evening | ORAL | Status: DC | PRN
  Administered 2013-06-08 – 2013-06-11 (×5): 5 mg via ORAL
  Filled 2013-06-08 (×5): qty 1

## 2013-06-08 NOTE — Progress Notes (Addendum)
    Subjective:  Denies CP; SOB is slightly improved; complains of nonproductive cough   Objective:  Filed Vitals:   06/08/13 0300 06/08/13 0400 06/08/13 0404 06/08/13 0715  BP:  109/52  119/48  Pulse:    90  Temp:  97.2 F (36.2 C)  97.7 F (36.5 C)  TempSrc:  Oral  Oral  Resp: 18 22  21   Height:      Weight:   154 lb 1.6 oz (69.9 kg)   SpO2: 95% 96%  94%    Intake/Output from previous day:  Intake/Output Summary (Last 24 hours) at 06/08/13 7829 Last data filed at 06/08/13 0800  Gross per 24 hour  Intake    580 ml  Output   2900 ml  Net  -2320 ml    Physical Exam: Physical exam: Well-developed frail in no acute distress.  Skin is warm and dry.  HEENT OP clear Neck is supple.  Chest with no rales few exp wheeze and deceased BS bases Cardiovascular exam is regular rate and rhythm. 2/6 systolic murmur Abdominal exam nontender or distended. No masses palpated. Extremities show trace to 1+ edema. neuro grossly intact    Lab Results: Basic Metabolic Panel:  Recent Labs  56/21/30 0150 06/07/13 0511 06/08/13 0542  NA 137 136 138  K 4.2 4.1 4.5  CL 103 102 100  CO2 23 24 28   GLUCOSE 91 88 85  BUN 37* 41* 43*  CREATININE 1.92* 2.16* 2.27*  CALCIUM 8.5 8.4 8.3*  MG  --  2.1  --    CBC:  Recent Labs  06/06/13 0150  WBC 7.6  NEUTROABS 6.0  HGB 11.6*  HCT 35.1*  MCV 85.6  PLT 162   Cardiac Enzymes:  Recent Labs  06/05/13 1510 06/05/13 2050 06/06/13 0150  TROPONINI 0.41* 0.32* 0.69*   Telemetry-sinus with NSVT  Assessment/Plan:  1. Acute on chronic systolic CHF/ICM: Pt presents with a 3 day h/o progressive dyspnea, orthopnea, edema, and upper abdominal discomfort. He has been found to have evidence of volume overload/chf by exam and cxr.  He is improving with aggressive diuresis. Continue coreg, arb and hydralazine/nitrates. Echo shows severe LV dysfunction and probable moderate AS.  2. CAD: Epigastric discomfort is resolved. Troponin is  mildly elevated. Suspect this is 2/2 #1 and renal insuff. Cont bb, statin, arb and ASA. No plans for further ischemia eval. 3. CKD III: Follow closely in setting of diuresis. Cont arb for now.  Decrease lasix from 80mg  BID BID to daily.  Transition to PO soon. 4. Tob abuse: Still smoking a few cigarettes/day. He is not motivated to quit.  5. Chronic coumadin: In setting of prior cva. Hold in setting of above.  6. Markedly elevated TSH-synthroid initiated at 50 mcg daily and will need fu with primary care.  Transfer to telemetry today  Hillis Range 06/08/2013, 9:52 AM

## 2013-06-09 LAB — BASIC METABOLIC PANEL
CO2: 26 mEq/L (ref 19–32)
Calcium: 8.4 mg/dL (ref 8.4–10.5)
Creatinine, Ser: 2.29 mg/dL — ABNORMAL HIGH (ref 0.50–1.35)
GFR calc Af Amer: 31 mL/min — ABNORMAL LOW (ref >90)
GFR calc non Af Amer: 27 mL/min — ABNORMAL LOW (ref >90)
Glucose, Bld: 97 mg/dL (ref 70–99)
Sodium: 133 mEq/L — ABNORMAL LOW (ref 135–145)

## 2013-06-09 LAB — PROTIME-INR
INR: 1.67 — ABNORMAL HIGH (ref 0.00–1.49)
Prothrombin Time: 19.2 seconds — ABNORMAL HIGH (ref 11.6–15.2)

## 2013-06-09 MED ORDER — HEPARIN SODIUM (PORCINE) 5000 UNIT/ML IJ SOLN
5000.0000 [IU] | Freq: Three times a day (TID) | INTRAMUSCULAR | Status: DC
  Administered 2013-06-09 – 2013-06-12 (×9): 5000 [IU] via SUBCUTANEOUS
  Filled 2013-06-09 (×12): qty 1

## 2013-06-09 NOTE — Progress Notes (Signed)
Consulting cardiologist: Eden Emms  Subjective:   Feeling some better, but overall is weak.    Objective:   Temp:  [97.3 F (36.3 C)-98.4 F (36.9 C)] 97.6 F (36.4 C) (12/14 0500) Pulse Rate:  [58-89] 61 (12/14 0824) Resp:  [16-29] 16 (12/14 0500) BP: (100-151)/(28-82) 120/60 mmHg (12/14 0824) SpO2:  [93 %-98 %] 96 % (12/14 0500) Weight:  [153 lb 14.4 oz (69.809 kg)] 153 lb 14.4 oz (69.809 kg) (12/14 0500) Last BM Date: 06/07/13  Filed Weights   06/07/13 0500 06/08/13 0404 06/09/13 0500  Weight: 156 lb 4.9 oz (70.9 kg) 154 lb 1.6 oz (69.9 kg) 153 lb 14.4 oz (69.809 kg)    Intake/Output Summary (Last 24 hours) at 06/09/13 0946 Last data filed at 06/09/13 0300  Gross per 24 hour  Intake    920 ml  Output   1600 ml  Net   -680 ml    Telemetry: NSR with frequent PAC's.   Exam:  General: No acute distress.  HEENT: Conjunctiva and lids normal, oropharynx clear.  Lungs: Inspiratory and expiratory rales, no wheezes. Cleared with coughing.  Cardiac: No elevated JVP or bruits. RRR, no gallop or rub.   Abdomen: Normoactive bowel sounds, nontender, nondistended.  Extremities: No pitting edema, distal pulses full.  Neuropsychiatric: Alert and oriented x3, affect appropriate.   Lab Results:  Basic Metabolic Panel:  Recent Labs Lab 06/06/13 0150 06/07/13 0511 06/08/13 0542 06/09/13 0340  NA 137 136 138 133*  K 4.2 4.1 4.5 4.1  CL 103 102 100 95*  CO2 23 24 28 26   GLUCOSE 91 88 85 97  BUN 37* 41* 43* 47*  CREATININE 1.92* 2.16* 2.27* 2.29*  CALCIUM 8.5 8.4 8.3* 8.4  MG  --  2.1  --   --     Liver Function Tests:  Recent Labs Lab 06/05/13 0838  AST 28  ALT 23  ALKPHOS 176*  BILITOT 1.1  PROT 7.7  ALBUMIN 3.6    CBC:  Recent Labs Lab 06/05/13 0838 06/06/13 0150  WBC 7.7 7.6  HGB 13.2 11.6*  HCT 40.8 35.1*  MCV 84.8 85.6  PLT 183 162    Cardiac Enzymes:  Recent Labs Lab 06/05/13 1510 06/05/13 2050 06/06/13 0150  TROPONINI 0.41*  0.32* 0.69*    BNP:  Recent Labs  06/05/13 0838  PROBNP 17824.0*    Coagulation:  Recent Labs Lab 06/07/13 0511 06/08/13 0542 06/09/13 0340  INR 2.59* 3.23* 1.67*     Medications:   Scheduled Medications: . amLODipine  10 mg Oral Daily  . aspirin EC  81 mg Oral Daily  . atorvastatin  80 mg Oral q1800  . carvedilol  3.125 mg Oral BID WC  . ezetimibe  10 mg Oral Daily  . furosemide  80 mg Intravenous Daily  . hydrALAZINE  100 mg Oral TID  . isosorbide mononitrate  30 mg Oral Daily  . levothyroxine  50 mcg Oral QAC breakfast  . losartan  100 mg Oral Daily  . sodium chloride  3 mL Intravenous Q12H     PRN Medications:  sodium chloride, acetaminophen, guaiFENesin, ondansetron (ZOFRAN) IV, sodium chloride, zolpidem   Assessment and Plan:   1. Acute on Chronic Systolic CHF: He continues on IV diuretics, Lasix 80 mg daily. He is congested, but cleared with coughing. I will order incentive spirometry.  Wt is down 3 lbs. Creatinine 2.29 with sodium of 133. Will decrease lasix to 40 mg IV daily today with follow up BMET  in am. Continue daily wts. Continue coreg and ARB .  2. CAD:  CABG 1985. . 05/2002 Rotablator/PTCA to ostial LCX and RI; c. 12/2009 MV: inf and lat infarct w/ minimal mid lateral and basilar ant ischemia.Medical management only.  3. CKD III: Creatinine is rising. Decreasing dose of lasix.   Marianna Fuss. Lyman Bishop NP [facility] 06/09/2013, 9:46 AM

## 2013-06-09 NOTE — Progress Notes (Signed)
Report given to [facility] on 3W @ 0120.  Patient awakened from sleep & informed of impending transfer.  Partial bath & gown changed after urinal spilled in bed.  VS taken & charted.  Patient transferred via wheelchair with monitor, personal belongings (all verified by patient) & RN to [phone] @ 0145.

## 2013-06-10 LAB — PROTIME-INR
INR: 1.32 (ref 0.00–1.49)
Prothrombin Time: 16.1 seconds — ABNORMAL HIGH (ref 11.6–15.2)

## 2013-06-10 MED ORDER — WARFARIN SODIUM 7.5 MG PO TABS
7.5000 mg | ORAL_TABLET | Freq: Once | ORAL | Status: AC
  Administered 2013-06-10: 7.5 mg via ORAL
  Filled 2013-06-10: qty 1

## 2013-06-10 MED ORDER — HYDRALAZINE HCL 50 MG PO TABS
50.0000 mg | ORAL_TABLET | Freq: Three times a day (TID) | ORAL | Status: DC
  Administered 2013-06-10 – 2013-06-12 (×6): 50 mg via ORAL
  Filled 2013-06-10 (×9): qty 1

## 2013-06-10 MED ORDER — WARFARIN - PHARMACIST DOSING INPATIENT
Freq: Every day | Status: DC
  Administered 2013-06-10: 18:00:00

## 2013-06-10 MED ORDER — FUROSEMIDE 10 MG/ML IJ SOLN
40.0000 mg | Freq: Every day | INTRAMUSCULAR | Status: DC
  Administered 2013-06-10 – 2013-06-11 (×2): 40 mg via INTRAVENOUS
  Filled 2013-06-10 (×2): qty 4

## 2013-06-10 MED ORDER — CARVEDILOL 6.25 MG PO TABS
6.2500 mg | ORAL_TABLET | Freq: Two times a day (BID) | ORAL | Status: DC
  Administered 2013-06-10 – 2013-06-12 (×3): 6.25 mg via ORAL
  Filled 2013-06-10 (×6): qty 1

## 2013-06-10 NOTE — Progress Notes (Signed)
ANTICOAGULATION CONSULT NOTE - Initial Consult  Pharmacy Consult for warfarin Indication: stroke  Allergies  Allergen Reactions  . Codeine Phosphate Nausea Only    REACTION: unspecified    Patient Measurements: Height: 5\' 11"  (180.3 cm) Weight: 153 lb 7 oz (69.6 kg) IBW/kg (Calculated) : 75.3  Vital Signs: Temp: 98.1 F (36.7 C) (12/15 0500) BP: 106/46 mmHg (12/15 0500) Pulse Rate: 67 (12/15 0500)  Labs:  Recent Labs  06/08/13 0542 06/09/13 0340 06/10/13 0550  LABPROT 31.8* 19.2* 16.1*  INR 3.23* 1.67* 1.32  CREATININE 2.27* 2.29*  --     Estimated Creatinine Clearance: 28.3 ml/min (by C-G formula based on Cr of 2.29).   Medical History: Past Medical History  Diagnosis Date  . Peripheral arterial disease     a. Ao-bifem bypass w/ 16x8 hemashield dacron graft, R aortorenal bypass w/ 6mm dacron graft;  b. 09/2011 ABI: R 0/87, L 0.92.  . Carotid artery occlusion     a. 12/2002 R CEA;  b. 05/2007 L Carotid stenting;  c. 09/2008 repeat L carotid stenting 2/2 ISR;  d. 09/2011 Carotid U/S: RICA 40-59%, LICA 60-79%, >50 dist LCCA stenosis.  Marland Kitchen CAD (coronary artery disease)     a. CABG 1985;  b. 05/2002 Rotablator/PTCA to ostial LCX and RI;  c. 12/2009 MV: inf and lat infarct w/ minimal mid lateral and basilar ant ischemia, EF 29%->Med Rx.  . Cardiomyopathy, ischemic     a. 06/2010 Cardiac MRI: EF 30%, anterolat, post, inf prior subendocardial infarcts;  b. 08/2012 Echo: EF 30-35%, mild LVH mult wma's, Gr 2 DD, mild AS, mod MR, mod dil LA, mild TR.  . Stroke     remote  . Hypertension   . Hyperlipidemia   . Tobacco abuse   . Anticoagulated on Coumadin   . CKD (chronic kidney disease), stage III   . Shortness of breath     Medications:  Prescriptions prior to admission  Medication Sig Dispense Refill  . amLODipine (NORVASC) 10 MG tablet Take 10 mg by mouth daily.        Marland Kitchen ezetimibe (ZETIA) 10 MG tablet Take 10 mg by mouth daily.        . hydrALAZINE (APRESOLINE) 100 MG  tablet take 1 tablet by mouth three times a day  90 tablet  6  . hydrochlorothiazide 25 MG tablet Take 25 mg by mouth daily.        Marland Kitchen losartan (COZAAR) 100 MG tablet Take 100 mg by mouth daily.        . metoprolol tartrate (LOPRESSOR) 25 MG tablet Take 25 mg by mouth 2 (two) times daily.      . rosuvastatin (CRESTOR) 40 MG tablet Take 40 mg by mouth daily.        Marland Kitchen warfarin (COUMADIN) 6 MG tablet Take 3-6 mg by mouth daily. Takes 3 mg on Saturday and 6 mg all other days        Assessment: 72 yom presented to the hospital with dyspnea, orthopnea and upper abdominal pain. He was on chronic coumadin for secondary stroke prevention but has been on hold for possible intervention. Coumadin to be resumed today. INR is subtherapeutic at 1.32, no new CBC today. No bleeding noted. Pt was started on heparin SQ while INR is low.   Goal of Therapy:  INR 2-3   Plan:  1. Coumadin 7.5mg  PO x 1 tonight 2. Daily INR 3. DC heparin SQ when INR is therapeutic  Ragen Laver, Drake Leach 06/10/2013,9:38 AM

## 2013-06-10 NOTE — Progress Notes (Addendum)
    Subjective:  Denies CP; dyspnea improving   Objective:  Filed Vitals:   06/09/13 1759 06/09/13 2100 06/09/13 2240 06/10/13 0500  BP: 110/50 89/46 113/42 106/46  Pulse: 67 63  67  Temp:  97.7 F (36.5 C)  98.1 F (36.7 C)  TempSrc:      Resp:  18  18  Height:      Weight:    153 lb 7 oz (69.6 kg)  SpO2:  96%  95%    Intake/Output from previous day:  Intake/Output Summary (Last 24 hours) at 06/10/13 0816 Last data filed at 06/10/13 0700  Gross per 24 hour  Intake   1080 ml  Output   2350 ml  Net  -1270 ml    Physical Exam: Physical exam: Well-developed frail in no acute distress.  Skin is warm and dry.  HEENT is normal.  Neck is supple.  Chest with diffuse rhonchi, exp wheeze and decreased BS bases Cardiovascular exam is regular rate and rhythm. 2/6 systolic murmur Abdominal exam nontender or distended. No masses palpated. Extremities show no edema. neuro grossly intact    Lab Results: Basic Metabolic Panel:  Recent Labs  16/10/96 0542 06/09/13 0340  NA 138 133*  K 4.5 4.1  CL 100 95*  CO2 28 26  GLUCOSE 85 97  BUN 43* 47*  CREATININE 2.27* 2.29*  CALCIUM 8.3* 8.4   Telemetry-sinus with NSVT  Assessment/Plan:  1. Acute on chronic systolic CHF/ICM: Pt presents with a 3 day h/o progressive dyspnea, orthopnea, edema, and upper abdominal discomfort. He has been found to have evidence of volume overload/chf by exam and cxr which is improving. Continue lasix at present dose and follow renal function. Continue coreg, arb and hydralazine/nitrates; decrease hydralazine to 50 Bid as BP borderline; change coreg to 6.25 BID. Echo shows severe LV dysfunction and probable moderate AS.  2. CAD: Troponin is mildly elevated. Suspect this is 2/2 #1 and renal insuff. Cont bb, statin, arb and ASA. No plans for further ischemia eval. 3. CKD III: Follow closely in setting of diuresis. Cont arb for now.  4. Tob abuse: Still smoking a few cigarettes/day. States he is  willing to quit.  5. Chronic coumadin: In setting of prior cva. Resume 6. Markedly elevated TSH-synthroid initiated at 50 mcg daily and will need fu with primary care.   Olga Millers 06/10/2013, 8:16 AM

## 2013-06-11 LAB — BASIC METABOLIC PANEL
BUN: 48 mg/dL — ABNORMAL HIGH (ref 6–23)
CO2: 25 mEq/L (ref 19–32)
Calcium: 8.7 mg/dL (ref 8.4–10.5)
Chloride: 95 mEq/L — ABNORMAL LOW (ref 96–112)
Creatinine, Ser: 2.42 mg/dL — ABNORMAL HIGH (ref 0.50–1.35)
GFR calc Af Amer: 29 mL/min — ABNORMAL LOW (ref >90)
GFR calc non Af Amer: 25 mL/min — ABNORMAL LOW (ref >90)
Glucose, Bld: 89 mg/dL (ref 70–99)
Potassium: 4.3 mEq/L (ref 3.5–5.1)
Sodium: 133 mEq/L — ABNORMAL LOW (ref 135–145)

## 2013-06-11 MED ORDER — WARFARIN SODIUM 7.5 MG PO TABS
7.5000 mg | ORAL_TABLET | Freq: Once | ORAL | Status: AC
  Administered 2013-06-11: 7.5 mg via ORAL
  Filled 2013-06-11: qty 1

## 2013-06-11 NOTE — Progress Notes (Signed)
Chart review complete.  Patient is not eligible for [facility] Care Management services because his/her PCP is not a [facility] primary care provider or is not [facility] affiliated.  For any additional questions or new referrals please contact Tim Henderson BSN RN [facility] Liaison at [phone] °

## 2013-06-11 NOTE — Progress Notes (Signed)
ANTICOAGULATION CONSULT NOTE - Follow-up  Pharmacy Consult for warfarin Indication: stroke  Allergies  Allergen Reactions  . Codeine Phosphate Nausea Only    REACTION: unspecified    Patient Measurements: Height: 5\' 11"  (180.3 cm) Weight: 152 lb 12.5 oz (69.3 kg) IBW/kg (Calculated) : 75.3  Vital Signs: Temp: 97.7 F (36.5 C) (12/16 0422) Temp src: Oral (12/16 0422) BP: 126/55 mmHg (12/16 0422) Pulse Rate: 67 (12/16 0422)  Labs:  Recent Labs  06/09/13 0340 06/10/13 0550 06/11/13 0623  LABPROT 19.2* 16.1* 15.1  INR 1.67* 1.32 1.22  CREATININE 2.29*  --   --     Estimated Creatinine Clearance: 28.2 ml/min (by C-G formula based on Cr of 2.29).  Assessment: 86 yom presented to the hospital with dyspnea, orthopnea and upper abdominal pain. He was on chronic coumadin for secondary stroke prevention but has been on hold for possible intervention. Coumadin was resumed 12/15. INR is subtherapeutic at 1.22, no new CBC today. No bleeding noted. Pt continues on heparin SQ while INR is low.   Goal of Therapy:  INR 2-3   Plan:  1. Repeat Coumadin 7.5mg  PO x 1 tonight 2. F/u AM INR, also check CBC 3. DC heparin SQ when INR is therapeutic  Michai Dieppa, Drake Leach 06/11/2013,8:43 AM

## 2013-06-11 NOTE — Progress Notes (Signed)
    Subjective:  Denies CP; dyspnea improving; complains of cough   Objective:  Filed Vitals:   06/10/13 1237 06/10/13 2036 06/10/13 2125 06/11/13 0422  BP: 126/102 99/40 105/50 126/55  Pulse: 69 59  67  Temp: 97.6 F (36.4 C) 97.9 F (36.6 C)  97.7 F (36.5 C)  TempSrc: Oral Oral  Oral  Resp: 17 18  20   Height:      Weight:    152 lb 12.5 oz (69.3 kg)  SpO2: 97% 95%  96%    Intake/Output from previous day:  Intake/Output Summary (Last 24 hours) at 06/11/13 0745 Last data filed at 06/11/13 0500  Gross per 24 hour  Intake    240 ml  Output   1225 ml  Net   -985 ml    Physical Exam: Physical exam: Well-developed frail in no acute distress.  Skin is warm and dry.  HEENT is normal.  Neck is supple.  Chest mild exp wheeze Cardiovascular exam is regular rate and rhythm. 2/6 systolic murmur Abdominal exam nontender or distended. No masses palpated. Extremities show no edema. neuro grossly intact    Lab Results: Basic Metabolic Panel:  Recent Labs  16/10/96 0340  NA 133*  K 4.1  CL 95*  CO2 26  GLUCOSE 97  BUN 47*  CREATININE 2.29*  CALCIUM 8.4   Telemetry-sinus with NSVT  Assessment/Plan:  1. Acute on chronic systolic CHF/ICM: Pt presents with a 3 day h/o progressive dyspnea, orthopnea, edema, and upper abdominal discomfort. He has been found to have evidence of volume overload/chf by exam and cxr which is now improving. Continue lasix at present dose and follow renal function. Continue coreg, arb and hydralazine/nitrates. Echo shows severe LV dysfunction and probable moderate AS.  2. CAD: Troponin is mildly elevated. Suspect this is 2/2 #1 and renal insuff. Cont bb, statin, arb and ASA. No plans for further ischemia eval. 3. CKD III: Follow closely in setting of diuresis. Cont arb for now.  4. Tob abuse: Still smoking a few cigarettes/day. States he is willing to quit.  5. Chronic coumadin: In setting of prior cva. Resumed 6. Markedly elevated  TSH-synthroid initiated at 50 mcg daily and will need fu with primary care. 7. Moderate AS - will need fu echos in the future.   Olga Millers 06/11/2013, 7:45 AM

## 2013-06-11 NOTE — Progress Notes (Signed)
Notified Isla Pence of patients recent blood pressures. He changed parameters of BP meds and evening dose of Coreg held. Pt asymptomatic. Will cont to monitor

## 2013-06-12 ENCOUNTER — Telehealth: Payer: Self-pay | Admitting: Cardiology

## 2013-06-12 ENCOUNTER — Encounter (HOSPITAL_COMMUNITY): Payer: Self-pay | Admitting: Nurse Practitioner

## 2013-06-12 ENCOUNTER — Other Ambulatory Visit: Payer: Self-pay | Admitting: Nurse Practitioner

## 2013-06-12 DIAGNOSIS — E039 Hypothyroidism, unspecified: Secondary | ICD-10-CM | POA: Diagnosis present

## 2013-06-12 LAB — CBC
HCT: 40.5 % (ref 39.0–52.0)
MCH: 28.3 pg (ref 26.0–34.0)
MCHC: 33.1 g/dL (ref 30.0–36.0)
MCV: 85.4 fL (ref 78.0–100.0)
Platelets: 227 10*3/uL (ref 150–400)
RBC: 4.74 MIL/uL (ref 4.22–5.81)
RDW: 14.8 % (ref 11.5–15.5)
WBC: 7.9 10*3/uL (ref 4.0–10.5)

## 2013-06-12 LAB — BASIC METABOLIC PANEL
BUN: 44 mg/dL — ABNORMAL HIGH (ref 6–23)
CO2: 28 mEq/L (ref 19–32)
Calcium: 9 mg/dL (ref 8.4–10.5)
Chloride: 96 mEq/L (ref 96–112)
Creatinine, Ser: 2.44 mg/dL — ABNORMAL HIGH (ref 0.50–1.35)
GFR calc non Af Amer: 25 mL/min — ABNORMAL LOW (ref >90)
Glucose, Bld: 85 mg/dL (ref 70–99)
Sodium: 135 mEq/L (ref 135–145)

## 2013-06-12 LAB — PROTIME-INR
INR: 1.3 (ref 0.00–1.49)
Prothrombin Time: 15.9 seconds — ABNORMAL HIGH (ref 11.6–15.2)

## 2013-06-12 MED ORDER — HYDRALAZINE HCL 50 MG PO TABS: 50.0000 mg | ORAL_TABLET | Freq: Three times a day (TID) | ORAL | Status: DC

## 2013-06-12 MED ORDER — WARFARIN SODIUM 7.5 MG PO TABS
7.5000 mg | ORAL_TABLET | Freq: Once | ORAL | Status: DC
  Filled 2013-06-12: qty 1

## 2013-06-12 MED ORDER — LEVOTHYROXINE SODIUM 50 MCG PO TABS: 50.0000 ug | ORAL_TABLET | Freq: Every day | ORAL | Status: DC

## 2013-06-12 MED ORDER — FUROSEMIDE 20 MG PO TABS: 20.0000 mg | ORAL_TABLET | Freq: Every day | ORAL | Status: DC

## 2013-06-12 MED ORDER — CARVEDILOL 6.25 MG PO TABS: 6.2500 mg | ORAL_TABLET | Freq: Two times a day (BID) | ORAL | Status: DC

## 2013-06-12 MED ORDER — ISOSORBIDE MONONITRATE ER 30 MG PO TB24: 30.0000 mg | ORAL_TABLET | Freq: Every day | ORAL | Status: DC

## 2013-06-12 NOTE — Discharge Summary (Signed)
See progress notes Kammie Scioli  

## 2013-06-12 NOTE — Progress Notes (Signed)
ANTICOAGULATION CONSULT NOTE - Follow-up  Pharmacy Consult for warfarin Indication: stroke  Allergies  Allergen Reactions  . Codeine Phosphate Nausea Only    REACTION: unspecified    Patient Measurements: Height: 5\' 11"  (180.3 cm) Weight: 151 lb 9.2 oz (68.755 kg) IBW/kg (Calculated) : 75.3  Vital Signs: Temp: 97.6 F (36.4 C) (12/17 0441) Temp src: Oral (12/17 0441) BP: 116/47 mmHg (12/17 0441) Pulse Rate: 67 (12/17 0441)  Labs:  Recent Labs  06/10/13 0550 06/11/13 0623 06/12/13 0650  HGB  --   --  13.4  HCT  --   --  40.5  PLT  --   --  227  LABPROT 16.1* 15.1 15.9*  INR 1.32 1.22 1.30  CREATININE  --  2.42* 2.44*    Estimated Creatinine Clearance: 26.2 ml/min (by C-G formula based on Cr of 2.44).  Assessment: 77 yom presented to the hospital with dyspnea, orthopnea and upper abdominal pain. He was on chronic coumadin for secondary stroke prevention but has been on hold for possible intervention. Coumadin was resumed 12/15. INR is subtherapeutic at 1.3, CBC is WNL. No bleeding noted. Pt continues on heparin SQ while INR is low. Pt to possibly be dc'd today.   Goal of Therapy:  INR 2-3   Plan:  1. Repeat Coumadin 7.5mg  PO x 1 tonight - if discharge, would restart home dose with INR check early next week 2. F/u AM INR 3. DC heparin SQ when INR is therapeutic  Shellee Streng, Drake Leach 06/12/2013,8:40 AM

## 2013-06-12 NOTE — Care Management (Signed)
1350 06-12-13 CM did call pt and received the son on the phone. Cm discussed with son if pt would be willing to have a HHRN visit for new CHF. Son stated he would call CM back today. CM did call NP for [facility] orders just in case pt was willing to accept services. Cm will await phone call from son. Gala Lewandowsky, RN,BSN [phone]

## 2013-06-12 NOTE — Telephone Encounter (Signed)
New problem    TCM 7 days with Dawayne Patricia  - per [facility] app   appt is  12/24 @ 9 am

## 2013-06-12 NOTE — Discharge Summary (Signed)
Discharge Summary   Patient ID: Clinton [name],  MRN: [id number], DOB/AGE: Nov 23, 1940 72 y.o.  Admit date: 06/05/2013 Discharge date: 06/12/2013  Primary Care Provider: Arlan Organ. Primary Cardiologist: B. Jens Som, MD   Discharge Diagnoses Principal Problem:   Acute on chronic systolic CHF (congestive heart failure), NYHA class 3  **Net negative diuresis of 9.3 Liters with reduction in weight from 159 lbs on admission to 151 lbs at discharge.  Active Problems:   CARDIOMYOPATHY, ISCHEMIC  **EF of 20-25% by echo this admission.   TOBACCO ABUSE   HYPERTENSION   CAD, ARTERY BYPASS GRAFT   CKD (chronic kidney disease), stage III  **Discharge creatinine of 2.44 with f/u BMET arranged for 06/17/2013.   Moderate aortic stenosis  **Valve area 1.07 cm2 - VTI; 1.08cm2 - Vmax, by echo this admission.   Hypothyroidism  **Newly diagnosed, TSH 50.15  **Synthroid 50 mcg daily initiated this admission.   HYPERLIPIDEMIA-MIXED   H/O CAROTID ARTERY STENOSIS   H/O CVA on chronic coumadin   H/O PERIPHERAL VASCULAR DISEASE   H/O Renal artery stenosis    Allergies Allergies  Allergen Reactions  . Codeine Phosphate Nausea Only    REACTION: unspecified   Procedures  2D Echocardiogram   Study Conclusions  - Left ventricle: The cavity size was severely dilated. Wall   thickness was increased in a pattern of severe LVH.   Systolic function was severely reduced. The estimated   ejection fraction was in the range of 20% to 25%. Diffuse   hypokinesis. - Aortic valve: Heavily calcified Likely at least moderate   AS given the degree of LV dysfunction Valve area:   1.07cm^2(VTI). Valve area: 1.08cm^2 (Vmax). - Mitral valve: Calcified annulus. Mildly thickened leaflets   . Mild regurgitation. - Left atrium: The atrium was moderately dilated. - Right atrium: The atrium was mildly dilated. - Atrial septum: No defect or patent foramen ovale was   identified. - Tricuspid valve:  Mild-moderate regurgitation. - Pulmonary arteries: PA peak pressure: 63mm Hg (S). _____________   History of Present Illness  72 year old male with prior history of coronary artery disease status post coronary artery bypass grafting, ischemic cardiomyopathy with an EF of 2025%, chronic systolic congestive heart failure, moderate aortic stenosis, hypertension, hyperlipidemia, peripheral vascular disease, and ongoing tobacco abuse. He has previously refused AICD placement. He was in his usual state of health until approximately 5 days prior to admission when he began to experience progressive dyspnea exertion and orthopnea. On the day prior to admission, he experienced dyspnea at rest as well as upper abdominal discomfort and bloating and lower extremity edema. He presented to the emergency department on the morning of December 10 secondary to ongoing symptoms and was found to have an elevated troponin. Both chest x-ray and physical exam showed evidence of volume overload and congestive heart failure. He was admitted for further evaluation.  Hospital Course  Patient was placed on intravenous diuretic therapy and responded very well. He was also maintained on beta blocker, ARB, hydralazine, and nitrate therapy. His troponin remained mildly elevated throughout admission but with a relatively flat trend and a peak of 0.69. This was not felt to be consistent with acute coronary syndrome and instead was felt to be secondary to CHF. There are currently no plans for further ischemic evaluation. With diuresis, patient had significant symptomatic improvement. For this admission, he had a net negative diuresis of 9.3 L with reduction in weight of 8 pounds. In this setting however he did have rise in  his creatinine from 1.57 on admission to a high of 2.44 at discharge. Because of progressive acute on chronic stage III kidney disease his Lasix dose was reduced on December 14, and subsequently held beginning this  morning. ARB therapy has been continued. As he symptomatically improved, he is felt to be stable for discharge this morning. We will hold Lasix for an additional 48 hours and have instructed him to begin taking 20 mg orally on December 19. We have arranged for a followup basic metabolic panel on December 22, with clinic followup 2 days later.  Of note, thyroid-stimulating hormone was evaluated during this admission and returned markedly elevated at 50.150. As result, levothyroxin therapy was initiated and 50 mcg daily. We have asked the patient followup with his primary care provider for additional management of newly diagnosed hypothyroidism.  Discharge Vitals Blood pressure 116/47, pulse 67, temperature 97.6 F (36.4 C), temperature source Oral, resp. rate 18, height 5\' 11"  (1.803 m), weight 151 lb 9.2 oz (68.755 kg), SpO2 94.00%.  Filed Weights   06/10/13 0500 06/11/13 0422 06/12/13 0441  Weight: 153 lb 7 oz (69.6 kg) 152 lb 12.5 oz (69.3 kg) 151 lb 9.2 oz (68.755 kg)   Labs  CBC  Recent Labs  06/12/13 0650  WBC 7.9  HGB 13.4  HCT 40.5  MCV 85.4  PLT 227   Basic Metabolic Panel  Recent Labs  06/11/13 0623 06/12/13 0650  NA 133* 135  K 4.3 4.4  CL 95* 96  CO2 25 28  GLUCOSE 89 85  BUN 48* 44*  CREATININE 2.42* 2.44*  CALCIUM 8.7 9.0   Liver Function Tests Lab Results  Component Value Date   ALT 23 06/05/2013   AST 28 06/05/2013   ALKPHOS 176* 06/05/2013   BILITOT 1.1 06/05/2013    Cardiac Enzymes Lab Results  Component Value Date   TROPONINI 0.69* 06/06/2013    Thyroid Function Tests Lab Results  Component Value Date   TSH 50.150* 06/05/2013    Disposition  Pt is being discharged home today in good condition.  Follow-up Plans & Appointments  Follow-up Information   Follow up with MANNING, Adelene Amas., MD. (as scheduled.)    Specialty:  Family Medicine   Contact information:   [address] [place] [address] [phone]       Follow  up with Norma Fredrickson, NP On 06/19/2013. (9:00 AM)    Specialty:  Nurse Practitioner   Contact information:   [address] [place] [address] [phone]       Follow up with [facility] On 06/17/2013. (4:15 PM for blood chemistry and 4:30 PM for coumadin appt.)    Contact information:   [address] [place] [address] [phone]     Discharge Medications    Medication List    STOP taking these medications       amLODipine 10 MG tablet  Commonly known as:  NORVASC     hydrochlorothiazide 25 MG tablet  Commonly known as:  HYDRODIURIL     metoprolol tartrate 25 MG tablet  Commonly known as:  LOPRESSOR      TAKE these medications       carvedilol 6.25 MG tablet  Commonly known as:  COREG  Take 1 tablet (6.25 mg total) by mouth 2 (two) times daily with a meal.     ezetimibe 10 MG tablet  Commonly known as:  ZETIA  Take 10 mg by mouth  daily.     furosemide 20 MG tablet  Commonly known as:  LASIX  Take 1 tablet (20 mg total) by mouth daily.  Start taking on:  06/14/2013     hydrALAZINE 50 MG tablet  Commonly known as:  APRESOLINE  Take 1 tablet (50 mg total) by mouth 3 (three) times daily.     isosorbide mononitrate 30 MG 24 hr tablet  Commonly known as:  IMDUR  Take 1 tablet (30 mg total) by mouth daily.     levothyroxine 50 MCG tablet  Commonly known as:  SYNTHROID, LEVOTHROID  Take 1 tablet (50 mcg total) by mouth daily before breakfast.     losartan 100 MG tablet  Commonly known as:  COZAAR  Take 100 mg by mouth daily.     rosuvastatin 40 MG tablet  Commonly known as:  CRESTOR  Take 40 mg by mouth daily.     warfarin 6 MG tablet  Commonly known as:  COUMADIN  Take 3-6 mg by mouth daily. Takes 3 mg on Saturday and 6 mg all other days       Outstanding Labs/Studies  INR and BMET on 06/17/2013.  Duration of Discharge Encounter   Greater than 30 minutes including physician  time.  Signed, Nicolasa Ducking NP 06/12/2013, 10:08 AM

## 2013-06-12 NOTE — Progress Notes (Signed)
    Subjective:  Denies CP; dyspnea resolved and cough improving   Objective:  Filed Vitals:   06/11/13 1331 06/11/13 1731 06/11/13 2122 06/12/13 0441  BP: 81/41 90/56 124/53 116/47  Pulse: 57  63 67  Temp: 97.6 F (36.4 C)  98.3 F (36.8 C) 97.6 F (36.4 C)  TempSrc: Oral  Oral Oral  Resp: 17  18 18   Height:      Weight:    151 lb 9.2 oz (68.755 kg)  SpO2: 96%  95% 94%    Intake/Output from previous day:  Intake/Output Summary (Last 24 hours) at 06/12/13 0701 Last data filed at 06/12/13 0526  Gross per 24 hour  Intake    480 ml  Output   1075 ml  Net   -595 ml    Physical Exam: Physical exam: Well-developed frail in no acute distress.  Skin is warm and dry.  HEENT is normal.  Neck is supple.  Chest CTA Cardiovascular exam is regular rate and rhythm. 2/6 systolic murmur Abdominal exam nontender or distended. No masses palpated. Extremities show no edema. neuro grossly intact    Lab Results: Basic Metabolic Panel:  Recent Labs  16/10/96 0623  NA 133*  K 4.3  CL 95*  CO2 25  GLUCOSE 89  BUN 48*  CREATININE 2.42*  CALCIUM 8.7   Telemetry-sinus   Assessment/Plan:  1. Acute on chronic systolic CHF/ICM: Pt presented with a 3 day h/o progressive dyspnea, orthopnea, edema, and upper abdominal discomfort. He was found to have evidence of volume overload/chf by exam and cxr which is now improved. Renal function worse yesterday with results pending this AM; hold lasix. Continue coreg, ARB and hydralazine/nitrates. DC amlodipine as BP borderline. Echo shows severe LV dysfunction and probable moderate AS.  2. CAD: Troponin is mildly elevated. Suspect this is 2/2 #1 and renal insuff. Cont bb, statin, arb and ASA. No plans for further ischemia eval. 3. CKD III: Cont arb for now.  4. Tob abuse: Still smoking a few cigarettes/day. States he is willing to quit.  5. Chronic coumadin: In setting of prior cva. Resumed; DC on present dose. 6. Markedly elevated  TSH-synthroid initiated at 50 mcg daily and will need fu with primary care. 7. Moderate AS - will need fu echos in the future.  Plan await BMET; if renal function stable, DC today; hold lasix for 48 hours and then resume at 20 mg daily; Check BMET12/22. HCTZ dced. FU with me 1-2 weeks. >30 min PA and physician time D2 Olga Millers 06/12/2013, 7:01 AM

## 2013-06-13 NOTE — Telephone Encounter (Signed)
Patient contacted regarding discharge from [facility]  Patient understands to follow up with lab and Coumadin clinic on 12/22 at 4PM and Norma Fredrickson NP on 12/24 at 9am Patient understands discharge instructions. Patient understands medications and regiment. Patient understands to bring all medications to this visit.  Patient feels well without complaints. He was advised to call with any problems.

## 2013-06-14 ENCOUNTER — Encounter: Payer: Self-pay | Admitting: Cardiology

## 2013-06-17 ENCOUNTER — Other Ambulatory Visit (INDEPENDENT_AMBULATORY_CARE_PROVIDER_SITE_OTHER): Payer: Medicare HMO

## 2013-06-17 ENCOUNTER — Ambulatory Visit (INDEPENDENT_AMBULATORY_CARE_PROVIDER_SITE_OTHER): Payer: Medicare HMO | Admitting: *Deleted

## 2013-06-17 DIAGNOSIS — I6350 Cerebral infarction due to unspecified occlusion or stenosis of unspecified cerebral artery: Secondary | ICD-10-CM

## 2013-06-17 DIAGNOSIS — I340 Nonrheumatic mitral (valve) insufficiency: Secondary | ICD-10-CM

## 2013-06-17 DIAGNOSIS — I059 Rheumatic mitral valve disease, unspecified: Secondary | ICD-10-CM

## 2013-06-17 DIAGNOSIS — Z7901 Long term (current) use of anticoagulants: Secondary | ICD-10-CM

## 2013-06-17 LAB — BASIC METABOLIC PANEL
CO2: 26 mEq/L (ref 19–32)
Chloride: 102 mEq/L (ref 96–112)
Creatinine, Ser: 1.6 mg/dL — ABNORMAL HIGH (ref 0.4–1.5)
GFR: 45.21 mL/min — ABNORMAL LOW (ref >60.00)
Sodium: 137 mEq/L (ref 135–145)

## 2013-06-17 LAB — POCT INR: INR: 2

## 2013-06-19 ENCOUNTER — Ambulatory Visit (INDEPENDENT_AMBULATORY_CARE_PROVIDER_SITE_OTHER): Payer: Medicare HMO | Admitting: Nurse Practitioner

## 2013-06-19 ENCOUNTER — Encounter: Payer: Self-pay | Admitting: Nurse Practitioner

## 2013-06-19 VITALS — BP 140/60 | HR 72 | Ht 71.0 in | Wt 158.1 lb

## 2013-06-19 DIAGNOSIS — I5022 Chronic systolic (congestive) heart failure: Secondary | ICD-10-CM

## 2013-06-19 MED ORDER — AMOXICILLIN 500 MG PO CAPS: 500.0000 mg | ORAL_CAPSULE | Freq: Three times a day (TID) | ORAL | Status: DC

## 2013-06-19 MED ORDER — ROSUVASTATIN CALCIUM 40 MG PO TABS: 40.0000 mg | ORAL_TABLET | Freq: Every day | ORAL | Status: DC

## 2013-06-19 NOTE — Progress Notes (Signed)
[name] Date of Birth: Dec 31, 1940 Medical Record #[id number]  History of Present Illness: Mr. Galea is seen back today for a post hospital visit/TOC visit. Seen for Dr. Jens Som. He has multiple issues which include an ischemic CM, EF of 20 to 25%, HTN, tobacco abuse, CAD with past CABG, stage III CKD, moderate AS, hypothyroidism, HLD, carotid disease, prior CVA - on coumadin, PVD and history of RAS. He has refused ICD implant.   Most recently admitted with acute systolic heart failure - diuresed down to 151 from 159 (9.3 liters). TSH was 50 - synthroid was started. Ef 20 to 25% by echo. Discharge creatinine 2.44.   Comes back today. Here alone. He says he is doing better. Not totally back to his baseline but improved. Less short of breath. Still coughing - thick white sputum. Chest sore from coughing. No fever or chills. Son is sick as well. No swelling. Weight 158 to 159 at home. Not smoking since his admission. Labs done earlier this week noted. Has not had all of his medicines yet today. He does endorse significant fatigue.   Current Outpatient Prescriptions  Medication Sig Dispense Refill  . carvedilol (COREG) 6.25 MG tablet Take 1 tablet (6.25 mg total) by mouth 2 (two) times daily with a meal.  60 tablet  6  . ezetimibe (ZETIA) 10 MG tablet Take 10 mg by mouth daily.        . furosemide (LASIX) 20 MG tablet Take 1 tablet (20 mg total) by mouth daily.  30 tablet  6  . hydrALAZINE (APRESOLINE) 50 MG tablet Take 1 tablet (50 mg total) by mouth 3 (three) times daily.  90 tablet  6  . isosorbide mononitrate (IMDUR) 30 MG 24 hr tablet Take 1 tablet (30 mg total) by mouth daily.  30 tablet  6  . levothyroxine (SYNTHROID, LEVOTHROID) 50 MCG tablet Take 1 tablet (50 mcg total) by mouth daily before breakfast.  30 tablet  3  . losartan (COZAAR) 100 MG tablet Take 100 mg by mouth daily.        . rosuvastatin (CRESTOR) 40 MG tablet Take 40 mg by mouth daily.        Marland Kitchen warfarin (COUMADIN)  6 MG tablet Take 3-6 mg by mouth daily. Takes 3 mg on Saturday and 6 mg all other days       No current facility-administered medications for this visit.    Allergies  Allergen Reactions  . Codeine Phosphate Nausea Only    REACTION: unspecified    Past Medical History  Diagnosis Date  . Peripheral arterial disease     a. Ao-bifem bypass w/ 16x8 hemashield dacron graft, R aortorenal bypass w/ 6mm dacron graft;  b. 09/2011 ABI: R 0/87, L 0.92.  . Carotid artery occlusion     a. 12/2002 R CEA;  b. 05/2007 L Carotid stenting;  c. 09/2008 repeat L carotid stenting 2/2 ISR;  d. 09/2011 Carotid U/S: RICA 40-59%, LICA 60-79%, >50 dist LCCA stenosis.  Marland Kitchen CAD (coronary artery disease)     a. CABG 1985;  b. 05/2002 Rotablator/PTCA to ostial LCX and RI;  c. 12/2009 MV: inf and lat infarct w/ minimal mid lateral and basilar ant ischemia, EF 29%->Med Rx.  . Cardiomyopathy, ischemic     a. 06/2010 Cardiac MRI: EF 30%, anterolat, post, inf prior subendocardial infarcts;  b. 08/2012 Echo: EF 30-35%, mild LVH mult wma's, Gr 2 DD;  c. 05/2013 Echo: EF 20-25%, diff HK, mod AS, mild MR,  mod dil LA, mild to mod TR, PASP .  . Stroke     remote  . Hypertension   . Hyperlipidemia   . Tobacco abuse   . Anticoagulated on Coumadin   . CKD (chronic kidney disease), stage III   . Chronic systolic CHF (congestive heart failure)     a. 05/2013 EF 20-25%, diff HK.  . Moderate aortic stenosis     a. 05/2013 Echo: at least moderate AS (Valve area 1.07 cm2 - VTI; 1.08cm2 - Vmax).  . Hypothyroidism     a. 05/2013 TSH 50.15 - synthroid 50 mcg daily started.    Past Surgical History  Procedure Laterality Date  . Carotid endarterectomy    . Coronary artery bypass graft    . Pr vein bypass graft,aorto-fem-pop    . Ureteral artery renal bypass      History  Smoking status  . Former Smoker -- 0.10 packs/day for 50 years  . Types: Cigarettes  . Quit date: 06/03/2013  Smokeless tobacco  . Never Used    Comment:  pt states he smokes 2 cigs per day       ' i AM CURRENTLY TRYING TO QUIT "     History  Alcohol Use No    Family History  Problem Relation Age of Onset  . CAD      Review of Systems: The review of systems is per the HPI.  All other systems were reviewed and are negative.  Physical Exam: BP 140/60  Pulse 72  Ht 5\' 11"  (1.803 m)  Wt 158 lb 1.9 oz (71.723 kg)  BMI 22.06 kg/m2  SpO2 95% Patient is very pleasant and in no acute distress. Skin is warm and dry. Color is normal.  HEENT is unremarkable. Normocephalic/atraumatic. PERRL. Sclera are nonicteric. Neck is supple. No masses. No JVD. Lungs are coarse. Cardiac exam shows a regular rate and rhythm. Outflow murmur noted. Abdomen is soft. Extremities are without edema. Gait and ROM are intact. No gross neurologic deficits noted.  Wt Readings from Last 3 Encounters:  06/19/13 158 lb 1.9 oz (71.723 kg)  06/12/13 151 lb 9.2 oz (68.755 kg)  08/24/12 157 lb (71.215 kg)     LABORATORY DATA:   Lab Results  Component Value Date   WBC 7.9 06/12/2013   HGB 13.4 06/12/2013   HCT 40.5 06/12/2013   PLT 227 06/12/2013   GLUCOSE 138* 06/17/2013   CHOL 239* 09/21/2012   TRIG 99.0 09/21/2012   HDL 45.20 09/21/2012   LDLDIRECT 186.1 09/21/2012   LDLCALC 77 03/31/2010   ALT 23 06/05/2013   AST 28 06/05/2013   NA 137 06/17/2013   K 4.1 06/17/2013   CL 102 06/17/2013   CREATININE 1.6* 06/17/2013   BUN 36* 06/17/2013   CO2 26 06/17/2013   TSH 50.150* 06/05/2013   INR 2.0 06/17/2013    Lab Results  Component Value Date   CHOL 239* 09/21/2012   HDL 45.20 09/21/2012   LDLCALC 77 03/31/2010   LDLDIRECT 186.1 09/21/2012   TRIG 99.0 09/21/2012   CHOLHDL 5 09/21/2012    Echo Study Conclusions  - Left ventricle: The cavity size was severely dilated. Wall thickness was increased in a pattern of severe LVH. Systolic function was severely reduced. The estimated ejection fraction was in the range of 20% to 25%. Diffuse hypokinesis. - Aortic  valve: Heavily calcified Likely at least moderate AS given the degree of LV dysfunction Valve area: 1.07cm^2(VTI). Valve area: 1.08cm^2 (Vmax). - Mitral valve:  Calcified annulus. Mildly thickened leaflets . Mild regurgitation. - Left atrium: The atrium was moderately dilated. - Right atrium: The atrium was mildly dilated. - Atrial septum: No defect or patent foramen ovale was identified. - Tricuspid valve: Mild-moderate regurgitation. - Pulmonary arteries: PA peak pressure: 63mm Hg (S).  Assessment / Plan: 1. Acute on chronic systolic HF - EF of 20 to 25% - has refused ICD implant in the past - looks improved. I have left him on his current regimen. See him back in about 2 weeks - try to start titrating medicines as tolerated.   2. Ischemic CM  3. CAD - remote CABG - no chest pain reported.   4. CVA - on chronic coumadin  5. Marked hypothyroidism - replacement started - hopefully his energy level will improve as his thyroid level improves.   6. Probably bronchitis/URI - will give him one week of Amoxicillin 500 mg TID  7. CKD - recheck labs on return.  Patient is agreeable to this plan and will call if any problems develop in the interim.   Rosalio Macadamia, RN, ANP-C [facility] [address] [place], [place]  [address]

## 2013-06-19 NOTE — Patient Instructions (Addendum)
Continue with your current medicines.  I have refilled the Crestor  I sent in a prescription for Amoxicillin to take 3 times a day for a week  May use some Tylenol PM if needed for sleep  Weigh yourself each morning and record.  Take extra dose of diuretic for weight gain of 3 pounds in 24 hours.   Limit sodium intake. Goal is to have less than 2000 mg (2gm) of salt per day.  I want to see you in about 2 weeks with labs/coumadin check  Congrats for stopping smoking!!!  Call the [facility] office at [phone] if you have any questions, problems or concerns.

## 2013-07-03 ENCOUNTER — Ambulatory Visit (INDEPENDENT_AMBULATORY_CARE_PROVIDER_SITE_OTHER): Payer: Medicare HMO | Admitting: *Deleted

## 2013-07-03 ENCOUNTER — Ambulatory Visit (INDEPENDENT_AMBULATORY_CARE_PROVIDER_SITE_OTHER): Payer: Medicare HMO | Admitting: Nurse Practitioner

## 2013-07-03 ENCOUNTER — Encounter: Payer: Self-pay | Admitting: Nurse Practitioner

## 2013-07-03 VITALS — BP 140/62 | HR 79 | Ht 71.0 in | Wt 154.1 lb

## 2013-07-03 DIAGNOSIS — I340 Nonrheumatic mitral (valve) insufficiency: Secondary | ICD-10-CM

## 2013-07-03 DIAGNOSIS — I5022 Chronic systolic (congestive) heart failure: Secondary | ICD-10-CM

## 2013-07-03 DIAGNOSIS — Z7901 Long term (current) use of anticoagulants: Secondary | ICD-10-CM

## 2013-07-03 DIAGNOSIS — I6350 Cerebral infarction due to unspecified occlusion or stenosis of unspecified cerebral artery: Secondary | ICD-10-CM

## 2013-07-03 DIAGNOSIS — I059 Rheumatic mitral valve disease, unspecified: Secondary | ICD-10-CM

## 2013-07-03 LAB — POCT INR: INR: 4.4

## 2013-07-03 LAB — BASIC METABOLIC PANEL
BUN: 31 mg/dL — ABNORMAL HIGH (ref 6–23)
CO2: 25 mEq/L (ref 19–32)
Calcium: 8.8 mg/dL (ref 8.4–10.5)
Chloride: 106 mEq/L (ref 96–112)
Creatinine, Ser: 1.8 mg/dL — ABNORMAL HIGH (ref 0.4–1.5)
GFR: 38.96 mL/min — ABNORMAL LOW (ref >60.00)
Glucose, Bld: 101 mg/dL — ABNORMAL HIGH (ref 70–99)
Potassium: 4.3 mEq/L (ref 3.5–5.1)
Sodium: 139 mEq/L (ref 135–145)

## 2013-07-03 LAB — TSH: TSH: 22.01 u[IU]/mL — ABNORMAL HIGH (ref 0.35–5.50)

## 2013-07-03 MED ORDER — CARVEDILOL 12.5 MG PO TABS: 12.5000 mg | ORAL_TABLET | Freq: Two times a day (BID) | ORAL | Status: DC

## 2013-07-03 NOTE — Patient Instructions (Addendum)
Stay on your current medicines but we are going to increase the Coreg to 12.5 mg two times a day - this has been sent to the drug store - you may take 2 of your 6.25 mg tabs two times a day and use those up to equal this dose  We will check your coumadin level today and labs  See Dr. Jens Somrenshaw next month (scheuduled recall)  Keep restricting your salt  Try to not smoke  Weigh each morning  Call the [facility] office at [phone] if you have any questions, problems or concerns.

## 2013-07-03 NOTE — Progress Notes (Signed)
Clinton [name] Date of Birth: 08-23-1940 Medical Record #[id number]  History of Present Illness: [name] is seen back today for a follow up visit. Seen for Dr. Jens Som. This is a 2 week check. He has multiple issues which include an ischemic CM, EF of 20 to 25%, HTN, tobacco abuse, CAD with past CABG, stage III CKD, moderate AS, hypothyroidism, HLD, carotid disease, prior CVA - on coumadin, PVD and history of RAS. He has refused ICD implant.   Most recently admitted with acute systolic heart failure - diuresed down to 151 from 159 (9.3 liters). TSH was 50 - synthroid was started. Ef 20 to 25% by echo. Discharge creatinine 2.44.   Seen 2 weeks ago and was doing better. Not totally back to his baseline but improved. Seemed to have a URI and I gave him a round of antibiotics. Hoped to start titrating his medicines on return visit.   Comes back today. Here alone. Doing ok. Breathing ok. His cold/bronchitis symptoms are gone. Still with fatigue. No chest pain. Not swelling. Says he is not smoking. Not dizzy or lightheaded. Epic says his recall letter has been sent but he never received for his follow up with Dr. Jens Som for next month. Overall he feels like he is doing well.    Current Outpatient Prescriptions  Medication Sig Dispense Refill  . amoxicillin (AMOXIL) 500 MG capsule Take 1 capsule (500 mg total) by mouth 3 (three) times daily.  21 capsule  0  . carvedilol (COREG) 6.25 MG tablet Take 1 tablet (6.25 mg total) by mouth 2 (two) times daily with a meal.  60 tablet  6  . ezetimibe (ZETIA) 10 MG tablet Take 10 mg by mouth daily.        . furosemide (LASIX) 20 MG tablet Take 1 tablet (20 mg total) by mouth daily.  30 tablet  6  . hydrALAZINE (APRESOLINE) 50 MG tablet Take 1 tablet (50 mg total) by mouth 3 (three) times daily.  90 tablet  6  . isosorbide mononitrate (IMDUR) 30 MG 24 hr tablet Take 1 tablet (30 mg total) by mouth daily.  30 tablet  6  . levothyroxine (SYNTHROID,  LEVOTHROID) 50 MCG tablet Take 1 tablet (50 mcg total) by mouth daily before breakfast.  30 tablet  3  . losartan (COZAAR) 100 MG tablet Take 100 mg by mouth daily.        . rosuvastatin (CRESTOR) 40 MG tablet Take 1 tablet (40 mg total) by mouth daily.  30 tablet  6  . warfarin (COUMADIN) 6 MG tablet Take 3-6 mg by mouth daily. Takes 3 mg on Saturday and 6 mg all other days       No current facility-administered medications for this visit.    Allergies  Allergen Reactions  . Codeine Phosphate Nausea Only    REACTION: unspecified    Past Medical History  Diagnosis Date  . Peripheral arterial disease     a. Ao-bifem bypass w/ 16x8 hemashield dacron graft, R aortorenal bypass w/ 6mm dacron graft;  b. 09/2011 ABI: R 0/87, L 0.92.  . Carotid artery occlusion     a. 12/2002 R CEA;  b. 05/2007 L Carotid stenting;  c. 09/2008 repeat L carotid stenting 2/2 ISR;  d. 09/2011 Carotid U/S: RICA 40-59%, LICA 60-79%, >50 dist LCCA stenosis.  Marland Kitchen CAD (coronary artery disease)     a. CABG 1985;  b. 05/2002 Rotablator/PTCA to ostial LCX and RI;  c. 12/2009 MV: inf and  lat infarct w/ minimal mid lateral and basilar ant ischemia, EF 29%->Med Rx.  . Cardiomyopathy, ischemic     a. 06/2010 Cardiac MRI: EF 30%, anterolat, post, inf prior subendocardial infarcts;  b. 08/2012 Echo: EF 30-35%, mild LVH mult wma's, Gr 2 DD;  c. 05/2013 Echo: EF 20-25%, diff HK, mod AS, mild MR, mod dil LA, mild to mod TR, PASP 63mmHg.  . Stroke     remote  . Hypertension   . Hyperlipidemia   . Tobacco abuse   . Anticoagulated on Coumadin   . CKD (chronic kidney disease), stage III   . Chronic systolic CHF (congestive heart failure)     a. 05/2013 EF 20-25%, diff HK.  . Moderate aortic stenosis     a. 05/2013 Echo: at least moderate AS (Valve area 1.07 cm2 - VTI; 1.08cm2 - Vmax).  . Hypothyroidism     a. 05/2013 TSH 50.15 - synthroid 50 mcg daily started.    Past Surgical History  Procedure Laterality Date  . Carotid  endarterectomy    . Coronary artery bypass graft    . Pr vein bypass graft,aorto-fem-pop    . Ureteral artery renal bypass      History  Smoking status  . Former Smoker -- 0.10 packs/day for 50 years  . Types: Cigarettes  . Quit date: 06/03/2013  Smokeless tobacco  . Never Used    Comment: pt states he smokes 2 cigs per day       ' i AM CURRENTLY TRYING TO QUIT "     History  Alcohol Use No    Family History  Problem Relation Age of Onset  . CAD      Review of Systems: The review of systems is per the HPI.  All other systems were reviewed and are negative.  Physical Exam: BP 140/62  Pulse 79  Ht 5\' 11"  (1.803 m)  Wt 154 lb 1.9 oz (69.908 kg)  BMI 21.50 kg/m2  SpO2 94% Patient is very pleasant and in no acute distress. Smells of tobacco. Skin is warm and dry. Color is normal.  HEENT is unremarkable. Normocephalic/atraumatic. PERRL. Sclera are nonicteric. Neck is supple. No masses. No JVD. Lungs are coarse. Cardiac exam shows a regular rate and rhythm. Soft outflow murmur noted.  Abdomen is soft. Extremities are without edema. Gait and ROM are intact. No gross neurologic deficits noted.  LABORATORY DATA: BMET/TSH/INR pending  Lab Results  Component Value Date   WBC 7.9 06/12/2013   HGB 13.4 06/12/2013   HCT 40.5 06/12/2013   PLT 227 06/12/2013   GLUCOSE 138* 06/17/2013   CHOL 239* 09/21/2012   TRIG 99.0 09/21/2012   HDL 45.20 09/21/2012   LDLDIRECT 186.1 09/21/2012   LDLCALC 77 03/31/2010   ALT 23 06/05/2013   AST 28 06/05/2013   NA 137 06/17/2013   K 4.1 06/17/2013   CL 102 06/17/2013   CREATININE 1.6* 06/17/2013   BUN 36* 06/17/2013   CO2 26 06/17/2013   TSH 50.150* 06/05/2013   INR 2.0 06/17/2013   Echo Study Conclusions  - Left ventricle: The cavity size was severely dilated. Wall thickness was increased in a pattern of severe LVH. Systolic function was severely reduced. The estimated ejection fraction was in the range of 20% to 25%.  Diffuse hypokinesis. - Aortic valve: Heavily calcified Likely at least moderate AS given the degree of LV dysfunction Valve area: 1.07cm^2(VTI). Valve area: 1.08cm^2 (Vmax). - Mitral valve: Calcified annulus. Mildly thickened leaflets . Mild regurgitation. -  Left atrium: The atrium was moderately dilated. - Right atrium: The atrium was mildly dilated. - Atrial septum: No defect or patent foramen ovale was identified. - Tricuspid valve: Mild-moderate regurgitation. - Pulmonary arteries: PA peak pressure: 63mm Hg (S).  Assessment / Plan:  1. Chronic systolic HF - EF of 20 to 25% - has refused ICD implant in the past - looks improved. Will increase his Coreg to 12.5 mg BID. Continue with salt restriction and daily weights.  2. Ischemic CM   3. CAD - remote CABG - no chest pain reported.   4. CVA - on chronic coumadin - rechecking INR today  5. Marked hypothyroidism - replacement started - hopefully his energy level will improve as his thyroid level improves. Will recheck his TSH - his PCP is Dr. Kathrynn Running who he has not seen in quite a while.   6. Probably bronchitis/URI - resolved  7. CKD - rechecking labs today.   8. Moderate AS - to be followed. No cardinal symptoms reported.   Will check his labs today. Coreg is increased. See Dr. Jens Som back in a month.   Patient is agreeable to this plan and will call if any problems develop in the interim.   Clinton Macadamia, RN, ANP-C  [facility]  [address]  [place], [place] [address]

## 2013-07-11 DIAGNOSIS — I5022 Chronic systolic (congestive) heart failure: Secondary | ICD-10-CM

## 2013-07-15 ENCOUNTER — Ambulatory Visit (INDEPENDENT_AMBULATORY_CARE_PROVIDER_SITE_OTHER): Payer: Medicare HMO | Admitting: Pharmacist

## 2013-07-15 DIAGNOSIS — Z7901 Long term (current) use of anticoagulants: Secondary | ICD-10-CM

## 2013-07-15 DIAGNOSIS — I059 Rheumatic mitral valve disease, unspecified: Secondary | ICD-10-CM

## 2013-07-15 DIAGNOSIS — I340 Nonrheumatic mitral (valve) insufficiency: Secondary | ICD-10-CM

## 2013-07-15 DIAGNOSIS — I6350 Cerebral infarction due to unspecified occlusion or stenosis of unspecified cerebral artery: Secondary | ICD-10-CM

## 2013-07-15 LAB — POCT INR: INR: 3.1

## 2013-07-29 ENCOUNTER — Ambulatory Visit (INDEPENDENT_AMBULATORY_CARE_PROVIDER_SITE_OTHER): Payer: Medicare HMO | Admitting: Pharmacist

## 2013-07-29 ENCOUNTER — Ambulatory Visit (INDEPENDENT_AMBULATORY_CARE_PROVIDER_SITE_OTHER): Payer: Medicare HMO | Admitting: Cardiology

## 2013-07-29 ENCOUNTER — Encounter (INDEPENDENT_AMBULATORY_CARE_PROVIDER_SITE_OTHER): Payer: Self-pay

## 2013-07-29 ENCOUNTER — Encounter: Payer: Self-pay | Admitting: Cardiology

## 2013-07-29 VITALS — BP 134/76 | HR 62 | Ht 71.0 in | Wt 168.0 lb

## 2013-07-29 DIAGNOSIS — Z7901 Long term (current) use of anticoagulants: Secondary | ICD-10-CM

## 2013-07-29 DIAGNOSIS — I6529 Occlusion and stenosis of unspecified carotid artery: Secondary | ICD-10-CM

## 2013-07-29 DIAGNOSIS — I2589 Other forms of chronic ischemic heart disease: Secondary | ICD-10-CM

## 2013-07-29 DIAGNOSIS — I059 Rheumatic mitral valve disease, unspecified: Secondary | ICD-10-CM

## 2013-07-29 DIAGNOSIS — E785 Hyperlipidemia, unspecified: Secondary | ICD-10-CM

## 2013-07-29 DIAGNOSIS — I701 Atherosclerosis of renal artery: Secondary | ICD-10-CM

## 2013-07-29 DIAGNOSIS — I25810 Atherosclerosis of coronary artery bypass graft(s) without angina pectoris: Secondary | ICD-10-CM

## 2013-07-29 DIAGNOSIS — I6350 Cerebral infarction due to unspecified occlusion or stenosis of unspecified cerebral artery: Secondary | ICD-10-CM

## 2013-07-29 DIAGNOSIS — I340 Nonrheumatic mitral (valve) insufficiency: Secondary | ICD-10-CM

## 2013-07-29 DIAGNOSIS — I5023 Acute on chronic systolic (congestive) heart failure: Secondary | ICD-10-CM

## 2013-07-29 LAB — BASIC METABOLIC PANEL
BUN: 35 mg/dL — AB (ref 6–23)
CO2: 24 meq/L (ref 19–32)
Calcium: 8.7 mg/dL (ref 8.4–10.5)
Chloride: 109 mEq/L (ref 96–112)
Creatinine, Ser: 1.6 mg/dL — ABNORMAL HIGH (ref 0.4–1.5)
GFR: 45.19 mL/min — AB (ref >60.00)
GLUCOSE: 89 mg/dL (ref 70–99)
POTASSIUM: 5 meq/L (ref 3.5–5.1)
Sodium: 138 mEq/L (ref 135–145)

## 2013-07-29 LAB — POCT INR: INR: 5.5

## 2013-07-29 LAB — BRAIN NATRIURETIC PEPTIDE: Pro B Natriuretic peptide (BNP): 3171 pg/mL — ABNORMAL HIGH (ref 0.0–100.0)

## 2013-07-29 MED ORDER — CARVEDILOL 12.5 MG PO TABS: 18.7500 mg | ORAL_TABLET | Freq: Two times a day (BID) | ORAL | Status: DC

## 2013-07-29 NOTE — Assessment & Plan Note (Signed)
Continue statin. Not on aspirin given need for Coumadin. 

## 2013-07-29 NOTE — Assessment & Plan Note (Signed)
Continue statin. 

## 2013-07-29 NOTE — Progress Notes (Signed)
HPI: FU coronary disease status post coronary bypassing graft, peripheral vascular disease, cerebrovascular disease, hypertension, hyperlipidemia and CHF. Peripheral vascular disease is followed by vascular surgery. His last myovue was performed in July of 2011. This revealed previous inferior and lateral wall infarct with minimal ischemia in the mid lateral wall and base of the anterior wall. The LV is dilated with EF 29%. Echocardiogram in December 2014 showed an ejection fraction of 20-25%, moderate aortic stenosis, biatrial enlargement, mild to moderate tricuspid regurgitation and mild mitral regurgitation. Seen by Dr Johney Frame in Feb 2012 for ICD but patient declined. Admitted in December of 2014 with congestive heart failure. Improved with diuresis. TSH noted to be 50 and patient started on Synthroid. Since he was last seen, and dyspnea on exertion but denies orthopnea, PND, pedal edema or chest pain.   Current Outpatient Prescriptions  Medication Sig Dispense Refill  . carvedilol (COREG) 12.5 MG tablet Take 1 tablet (12.5 mg total) by mouth 2 (two) times daily.  60 tablet  6  . ezetimibe (ZETIA) 10 MG tablet Take 10 mg by mouth daily.        . furosemide (LASIX) 20 MG tablet Take 1 tablet (20 mg total) by mouth daily.  30 tablet  6  . hydrALAZINE (APRESOLINE) 50 MG tablet Take 1 tablet (50 mg total) by mouth 3 (three) times daily.  90 tablet  6  . isosorbide mononitrate (IMDUR) 30 MG 24 hr tablet Take 1 tablet (30 mg total) by mouth daily.  30 tablet  6  . levothyroxine (SYNTHROID, LEVOTHROID) 50 MCG tablet Take 1 tablet (50 mcg total) by mouth daily before breakfast.  30 tablet  3  . losartan (COZAAR) 100 MG tablet Take 100 mg by mouth daily.        . rosuvastatin (CRESTOR) 40 MG tablet Take 1 tablet (40 mg total) by mouth daily.  30 tablet  6  . warfarin (COUMADIN) 6 MG tablet Take 3-6 mg by mouth daily. Takes 3 mg on Saturday and 6 mg all other days       No current  facility-administered medications for this visit.     Past Medical History  Diagnosis Date  . Peripheral arterial disease     a. Ao-bifem bypass w/ 16x8 hemashield dacron graft, R aortorenal bypass w/ 6mm dacron graft;  b. 09/2011 ABI: R 0/87, L 0.92.  . Carotid artery occlusion     a. 12/2002 R CEA;  b. 05/2007 L Carotid stenting;  c. 09/2008 repeat L carotid stenting 2/2 ISR;  d. 09/2011 Carotid U/S: RICA 40-59%, LICA 60-79%, >50 dist LCCA stenosis.  Marland Kitchen CAD (coronary artery disease)     a. CABG 1985;  b. 05/2002 Rotablator/PTCA to ostial LCX and RI;  c. 12/2009 MV: inf and lat infarct w/ minimal mid lateral and basilar ant ischemia, EF 29%->Med Rx.  . Cardiomyopathy, ischemic     a. 06/2010 Cardiac MRI: EF 30%, anterolat, post, inf prior subendocardial infarcts;  b. 08/2012 Echo: EF 30-35%, mild LVH mult wma's, Gr 2 DD;  c. 05/2013 Echo: EF 20-25%, diff HK, mod AS, mild MR, mod dil LA, mild to mod TR, PASP .  . Stroke     remote  . Hypertension   . Hyperlipidemia   . Tobacco abuse   . Anticoagulated on Coumadin   . CKD (chronic kidney disease), stage III   . Chronic systolic CHF (congestive heart failure)     a. 05/2013 EF 20-25%, diff HK.  Marland Kitchen  Moderate aortic stenosis     a. 05/2013 Echo: at least moderate AS (Valve area 1.07 cm2 - VTI; 1.08cm2 - Vmax).  . Hypothyroidism     a. 05/2013 TSH 50.15 - synthroid 50 mcg daily started.    Past Surgical History  Procedure Laterality Date  . Carotid endarterectomy    . Coronary artery bypass graft    . Pr vein bypass graft,aorto-fem-pop    . Ureteral artery renal bypass      History   Social History  . Marital Status: Divorced    Spouse Name: N/A    Number of Children: N/A  . Years of Education: N/A   Occupational History  . Not on file.   Social History Main Topics  . Smoking status: Former Smoker -- 0.10 packs/day for 50 years    Types: Cigarettes    Quit date: 06/03/2013  . Smokeless tobacco: Never Used     Comment: pt  states he smokes 2 cigs per day       ' i AM CURRENTLY TRYING TO QUIT "   . Alcohol Use: No  . Drug Use: No  . Sexual Activity: Not on file   Other Topics Concern  . Not on file   Social History Narrative   Lives in [place] with his son.  They eat out often and otherwise eat a lot of prepared/processed/microwave-type meals.  He continues to smoke a few cigarettes/day.    ROS: no fevers or chills, productive cough, hemoptysis, dysphasia, odynophagia, melena, hematochezia, dysuria, hematuria, rash, seizure activity, orthopnea, PND, pedal edema, claudication. Remaining systems are negative.  Physical Exam: Well-developed well-nourished in no acute distress.  Skin is warm and dry.  HEENT is normal.  Neck is supple.  Chest diminished BS throughout Cardiovascular exam is regular rate and rhythm. 2/6 systolic murmur left sternal border. Abdominal exam nontender or distended. No masses palpated. Extremities show trace ankle edema. neuro grossly intact

## 2013-07-29 NOTE — Assessment & Plan Note (Signed)
Patient has discontinued. 

## 2013-07-29 NOTE — Assessment & Plan Note (Signed)
I've asked him to followup with primary care for this, COPD and renal insufficiency.

## 2013-07-29 NOTE — Patient Instructions (Signed)
Your physician recommends that you schedule a follow-up appointment in: 3 MONTHS WITH DR CRENSHAW  INCREASE FUROSEMIDE TO 40 MG (TWO TABLETS) ONCE DAILY X 3 DAYS THEN DECREASE BACK TO ONE TABLET DAILY  TAKE EXTRA FUROSEMIDE ONCE DAILY AS NEEDED FOR INCREASED WEIGHT OR SWELLING OR SHORTNESS OF BREATH  INCREASE CARVEDILOL TO 18.75 MG ( TAKE ONE AND ONE HALF 12.5 MG TABLETS TWICE DAILY)  Your physician recommends that you HAVE LAB WORK TODAY

## 2013-07-29 NOTE — Assessment & Plan Note (Signed)
Continue statin. Followed by vascular surgery. 

## 2013-07-29 NOTE — Assessment & Plan Note (Signed)
Followed by vascular surgery. 

## 2013-07-29 NOTE — Assessment & Plan Note (Addendum)
Change carvedilol to 18.75 mg by mouth twice a day. Continue ARB, hydralazine and nitrates. Previously declined ICD.

## 2013-07-29 NOTE — Assessment & Plan Note (Signed)
Patient mildly volume overloaded on examination. Increase Lasix to 40 mg daily for 3 days. I will then resume 20 mg daily with an additional 20 mg as needed for weight gain of 2 pounds. Check potassium, renal function and BNP. Patient instructed on low sodium diet.

## 2013-07-29 NOTE — Assessment & Plan Note (Signed)
Blood pressure controlled. Continue present medications. 

## 2013-07-29 NOTE — Assessment & Plan Note (Signed)
Patient will need follow-up echoes in the future. 

## 2013-07-30 ENCOUNTER — Ambulatory Visit: Payer: Medicare HMO | Admitting: Pulmonary Disease

## 2013-08-08 ENCOUNTER — Ambulatory Visit (INDEPENDENT_AMBULATORY_CARE_PROVIDER_SITE_OTHER): Payer: Medicare HMO | Admitting: *Deleted

## 2013-08-08 DIAGNOSIS — Z7901 Long term (current) use of anticoagulants: Secondary | ICD-10-CM

## 2013-08-08 DIAGNOSIS — Z5181 Encounter for therapeutic drug level monitoring: Secondary | ICD-10-CM | POA: Insufficient documentation

## 2013-08-08 DIAGNOSIS — I340 Nonrheumatic mitral (valve) insufficiency: Secondary | ICD-10-CM

## 2013-08-08 DIAGNOSIS — I059 Rheumatic mitral valve disease, unspecified: Secondary | ICD-10-CM

## 2013-08-08 DIAGNOSIS — I6350 Cerebral infarction due to unspecified occlusion or stenosis of unspecified cerebral artery: Secondary | ICD-10-CM

## 2013-08-08 LAB — POCT INR: INR: 2

## 2013-08-26 ENCOUNTER — Ambulatory Visit (INDEPENDENT_AMBULATORY_CARE_PROVIDER_SITE_OTHER): Payer: Medicare HMO | Admitting: Pharmacist

## 2013-08-26 DIAGNOSIS — I6350 Cerebral infarction due to unspecified occlusion or stenosis of unspecified cerebral artery: Secondary | ICD-10-CM

## 2013-08-26 DIAGNOSIS — Z7901 Long term (current) use of anticoagulants: Secondary | ICD-10-CM

## 2013-08-26 DIAGNOSIS — Z5181 Encounter for therapeutic drug level monitoring: Secondary | ICD-10-CM

## 2013-08-26 DIAGNOSIS — I059 Rheumatic mitral valve disease, unspecified: Secondary | ICD-10-CM

## 2013-08-26 DIAGNOSIS — I340 Nonrheumatic mitral (valve) insufficiency: Secondary | ICD-10-CM

## 2013-08-26 LAB — POCT INR: INR: 1.8

## 2013-09-18 ENCOUNTER — Ambulatory Visit (INDEPENDENT_AMBULATORY_CARE_PROVIDER_SITE_OTHER): Payer: Medicare HMO | Admitting: *Deleted

## 2013-09-18 DIAGNOSIS — I340 Nonrheumatic mitral (valve) insufficiency: Secondary | ICD-10-CM

## 2013-09-18 DIAGNOSIS — Z7901 Long term (current) use of anticoagulants: Secondary | ICD-10-CM

## 2013-09-18 DIAGNOSIS — I6350 Cerebral infarction due to unspecified occlusion or stenosis of unspecified cerebral artery: Secondary | ICD-10-CM

## 2013-09-18 DIAGNOSIS — Z5181 Encounter for therapeutic drug level monitoring: Secondary | ICD-10-CM

## 2013-09-18 DIAGNOSIS — I059 Rheumatic mitral valve disease, unspecified: Secondary | ICD-10-CM

## 2013-09-18 LAB — POCT INR: INR: 1.9

## 2013-10-03 ENCOUNTER — Ambulatory Visit (INDEPENDENT_AMBULATORY_CARE_PROVIDER_SITE_OTHER): Payer: Medicare HMO

## 2013-10-03 DIAGNOSIS — I6350 Cerebral infarction due to unspecified occlusion or stenosis of unspecified cerebral artery: Secondary | ICD-10-CM

## 2013-10-03 DIAGNOSIS — I059 Rheumatic mitral valve disease, unspecified: Secondary | ICD-10-CM

## 2013-10-03 DIAGNOSIS — Z7901 Long term (current) use of anticoagulants: Secondary | ICD-10-CM

## 2013-10-03 DIAGNOSIS — Z5181 Encounter for therapeutic drug level monitoring: Secondary | ICD-10-CM

## 2013-10-03 DIAGNOSIS — I340 Nonrheumatic mitral (valve) insufficiency: Secondary | ICD-10-CM

## 2013-10-03 LAB — POCT INR: INR: 2

## 2013-10-16 ENCOUNTER — Other Ambulatory Visit: Payer: Self-pay

## 2013-10-16 MED ORDER — LEVOTHYROXINE SODIUM 50 MCG PO TABS: 50.0000 ug | ORAL_TABLET | Freq: Every day | ORAL | Status: DC

## 2013-10-17 ENCOUNTER — Other Ambulatory Visit: Payer: Self-pay | Admitting: Cardiology

## 2013-10-31 ENCOUNTER — Encounter: Payer: Self-pay | Admitting: Cardiology

## 2013-10-31 ENCOUNTER — Ambulatory Visit (INDEPENDENT_AMBULATORY_CARE_PROVIDER_SITE_OTHER): Payer: Medicare HMO | Admitting: *Deleted

## 2013-10-31 ENCOUNTER — Ambulatory Visit (INDEPENDENT_AMBULATORY_CARE_PROVIDER_SITE_OTHER): Payer: Medicare HMO | Admitting: Cardiology

## 2013-10-31 VITALS — BP 112/60 | HR 71 | Ht 71.0 in | Wt 155.4 lb

## 2013-10-31 DIAGNOSIS — F17200 Nicotine dependence, unspecified, uncomplicated: Secondary | ICD-10-CM

## 2013-10-31 DIAGNOSIS — Z7901 Long term (current) use of anticoagulants: Secondary | ICD-10-CM

## 2013-10-31 DIAGNOSIS — I059 Rheumatic mitral valve disease, unspecified: Secondary | ICD-10-CM

## 2013-10-31 DIAGNOSIS — I10 Essential (primary) hypertension: Secondary | ICD-10-CM

## 2013-10-31 DIAGNOSIS — I6529 Occlusion and stenosis of unspecified carotid artery: Secondary | ICD-10-CM

## 2013-10-31 DIAGNOSIS — I359 Nonrheumatic aortic valve disorder, unspecified: Secondary | ICD-10-CM

## 2013-10-31 DIAGNOSIS — I340 Nonrheumatic mitral (valve) insufficiency: Secondary | ICD-10-CM

## 2013-10-31 DIAGNOSIS — E039 Hypothyroidism, unspecified: Secondary | ICD-10-CM

## 2013-10-31 DIAGNOSIS — I701 Atherosclerosis of renal artery: Secondary | ICD-10-CM

## 2013-10-31 DIAGNOSIS — I6350 Cerebral infarction due to unspecified occlusion or stenosis of unspecified cerebral artery: Secondary | ICD-10-CM

## 2013-10-31 DIAGNOSIS — Z5181 Encounter for therapeutic drug level monitoring: Secondary | ICD-10-CM

## 2013-10-31 DIAGNOSIS — I25810 Atherosclerosis of coronary artery bypass graft(s) without angina pectoris: Secondary | ICD-10-CM

## 2013-10-31 DIAGNOSIS — E785 Hyperlipidemia, unspecified: Secondary | ICD-10-CM

## 2013-10-31 DIAGNOSIS — I5023 Acute on chronic systolic (congestive) heart failure: Secondary | ICD-10-CM

## 2013-10-31 DIAGNOSIS — I2589 Other forms of chronic ischemic heart disease: Secondary | ICD-10-CM

## 2013-10-31 DIAGNOSIS — I350 Nonrheumatic aortic (valve) stenosis: Secondary | ICD-10-CM

## 2013-10-31 DIAGNOSIS — I739 Peripheral vascular disease, unspecified: Secondary | ICD-10-CM

## 2013-10-31 LAB — LIPID PANEL
CHOLESTEROL: 243 mg/dL — AB (ref 0–200)
HDL: 40.9 mg/dL (ref >39.00)
LDL Cholesterol: 182 mg/dL — ABNORMAL HIGH (ref 0–99)
TRIGLYCERIDES: 102 mg/dL (ref 0.0–149.0)
Total CHOL/HDL Ratio: 6
VLDL: 20.4 mg/dL (ref 0.0–40.0)

## 2013-10-31 LAB — POCT INR: INR: 2.3

## 2013-10-31 LAB — CBC WITH DIFFERENTIAL/PLATELET
BASOS PCT: 0.3 % (ref 0.0–3.0)
Basophils Absolute: 0 10*3/uL (ref 0.0–0.1)
EOS PCT: 1.3 % (ref 0.0–5.0)
Eosinophils Absolute: 0.1 10*3/uL (ref 0.0–0.7)
HCT: 38.6 % — ABNORMAL LOW (ref 39.0–52.0)
Hemoglobin: 12.8 g/dL — ABNORMAL LOW (ref 13.0–17.0)
Lymphocytes Relative: 10 % — ABNORMAL LOW (ref 12.0–46.0)
Lymphs Abs: 0.8 10*3/uL (ref 0.7–4.0)
MCHC: 33.2 g/dL (ref 30.0–36.0)
MCV: 84.9 fl (ref 78.0–100.0)
Monocytes Absolute: 0.7 10*3/uL (ref 0.1–1.0)
Monocytes Relative: 8.9 % (ref 3.0–12.0)
NEUTROS PCT: 79.5 % — AB (ref 43.0–77.0)
Neutro Abs: 6.5 10*3/uL (ref 1.4–7.7)
PLATELETS: 182 10*3/uL (ref 150.0–400.0)
RBC: 4.54 Mil/uL (ref 4.22–5.81)
RDW: 17.6 % — ABNORMAL HIGH (ref 11.5–15.5)
WBC: 8.1 10*3/uL (ref 4.0–10.5)

## 2013-10-31 LAB — BASIC METABOLIC PANEL
BUN: 41 mg/dL — AB (ref 6–23)
CALCIUM: 9 mg/dL (ref 8.4–10.5)
CO2: 23 mEq/L (ref 19–32)
CREATININE: 2 mg/dL — AB (ref 0.4–1.5)
Chloride: 104 mEq/L (ref 96–112)
GFR: 34.91 mL/min — ABNORMAL LOW (ref >60.00)
Glucose, Bld: 102 mg/dL — ABNORMAL HIGH (ref 70–99)
Potassium: 4.6 mEq/L (ref 3.5–5.1)
Sodium: 135 mEq/L (ref 135–145)

## 2013-10-31 LAB — HEPATIC FUNCTION PANEL
ALBUMIN: 3.9 g/dL (ref 3.5–5.2)
ALT: 17 U/L (ref 0–53)
AST: 20 U/L (ref 0–37)
Alkaline Phosphatase: 130 U/L — ABNORMAL HIGH (ref 39–117)
Bilirubin, Direct: 0.2 mg/dL (ref 0.0–0.3)
Total Bilirubin: 1 mg/dL (ref 0.2–1.2)
Total Protein: 7.4 g/dL (ref 6.0–8.3)

## 2013-10-31 NOTE — Progress Notes (Signed)
HPI: FU coronary disease status post coronary bypassing graft, peripheral vascular disease, cerebrovascular disease, hypertension, hyperlipidemia and CHF. Peripheral vascular disease is followed by vascular surgery. His last myovue was performed in July of 2011. This revealed previous inferior and lateral wall infarct with minimal ischemia in the mid lateral wall and base of the anterior wall. The LV is dilated with EF 29%. Echocardiogram in December 2014 showed an ejection fraction of 20-25%, moderate aortic stenosis, biatrial enlargement, mild to moderate tricuspid regurgitation and mild mitral regurgitation. Seen by Dr Johney FrameAllred in Feb 2012 for ICD but patient declined. Admitted in December of 2014 with congestive heart failure. Improved with diuresis. TSH noted to be 50 and patient started on Synthroid. Since he was last seen, the patient has dyspnea with more extreme activities but not with routine activities. It is relieved with rest. It is not associated with chest pain. There is no orthopnea, PND or pedal edema. There is no syncope or palpitations. There is no exertional chest pain.    Current Outpatient Prescriptions  Medication Sig Dispense Refill  . carvedilol (COREG) 12.5 MG tablet Take 1.5 tablets (18.75 mg total) by mouth 2 (two) times daily.  90 tablet  12  . ezetimibe (ZETIA) 10 MG tablet Take 10 mg by mouth daily.        . furosemide (LASIX) 20 MG tablet Take 1 tablet (20 mg total) by mouth daily.  30 tablet  6  . hydrALAZINE (APRESOLINE) 50 MG tablet Take 1 tablet (50 mg total) by mouth 3 (three) times daily.  90 tablet  6  . isosorbide mononitrate (IMDUR) 30 MG 24 hr tablet Take 1 tablet (30 mg total) by mouth daily.  30 tablet  6  . levothyroxine (SYNTHROID, LEVOTHROID) 50 MCG tablet Take 1 tablet (50 mcg total) by mouth daily before breakfast.  30 tablet  3  . losartan (COZAAR) 100 MG tablet Take 100 mg by mouth daily.        . rosuvastatin (CRESTOR) 40 MG tablet Take 1  tablet (40 mg total) by mouth daily.  30 tablet  6  . warfarin (COUMADIN) 6 MG tablet take as directed BY THE COUMADIN CLINIC  35 tablet  3   No current facility-administered medications for this visit.     Past Medical History  Diagnosis Date  . Peripheral arterial disease     a. Ao-bifem bypass w/ 16x8 hemashield dacron graft, R aortorenal bypass w/ 6mm dacron graft;  b. 09/2011 ABI: R 0/87, L 0.92.  . Carotid artery occlusion     a. 12/2002 R CEA;  b. 05/2007 L Carotid stenting;  c. 09/2008 repeat L carotid stenting 2/2 ISR;  d. 09/2011 Carotid U/S: RICA 40-59%, LICA 60-79%, >50 dist LCCA stenosis.  Marland Kitchen. CAD (coronary artery disease)     a. CABG 1985;  b. 05/2002 Rotablator/PTCA to ostial LCX and RI;  c. 12/2009 MV: inf and lat infarct w/ minimal mid lateral and basilar ant ischemia, EF 29%->Med Rx.  . Cardiomyopathy, ischemic     a. 06/2010 Cardiac MRI: EF 30%, anterolat, post, inf prior subendocardial infarcts;  b. 08/2012 Echo: EF 30-35%, mild LVH mult wma's, Gr 2 DD;  c. 05/2013 Echo: EF 20-25%, diff HK, mod AS, mild MR, mod dil LA, mild to mod TR, PASP 63mmHg.  . Stroke     remote  . Hypertension   . Hyperlipidemia   . Tobacco abuse   . Anticoagulated on Coumadin   . CKD (  chronic kidney disease), stage III   . Chronic systolic CHF (congestive heart failure)     a. 05/2013 EF 20-25%, diff HK.  . Moderate aortic stenosis     a. 05/2013 Echo: at least moderate AS (Valve area 1.07 cm2 - VTI; 1.08cm2 - Vmax).  . Hypothyroidism     a. 05/2013 TSH 50.15 - synthroid 50 mcg daily started.    Past Surgical History  Procedure Laterality Date  . Carotid endarterectomy    . Coronary artery bypass graft    . Pr vein bypass graft,aorto-fem-pop    . Ureteral artery renal bypass      History   Social History  . Marital Status: Divorced    Spouse Name: N/A    Number of Children: N/A  . Years of Education: N/A   Occupational History  . Not on file.   Social History Main Topics  .  Smoking status: Former Smoker -- 0.10 packs/day for 50 years    Types: Cigarettes    Quit date: 06/03/2013  . Smokeless tobacco: Never Used     Comment: pt states he smokes 2 cigs per day       ' i AM CURRENTLY TRYING TO QUIT "   . Alcohol Use: No  . Drug Use: No  . Sexual Activity: Not on file   Other Topics Concern  . Not on file   Social History Narrative   Lives in [place] with his son.  They eat out often and otherwise eat a lot of prepared/processed/microwave-type meals.  He continues to smoke a few cigarettes/day.    ROS: no fevers or chills, productive cough, hemoptysis, dysphasia, odynophagia, melena, hematochezia, dysuria, hematuria, rash, seizure activity, orthopnea, PND, pedal edema, claudication. Remaining systems are negative.  Physical Exam: Well-developed well-nourished in no acute distress.  Skin is warm and dry.  HEENT is normal.  Neck is supple.  Chest is clear to auscultation with normal expansion.  Cardiovascular exam is regular rate and rhythm. 2/6 systolic murmur left sternal borderAnd 2/6 systolic murmur apex. Abdominal exam nontender or distended. No masses palpated. Extremities show no edema. neuro grossly intact  ECG Question junctional rhythm with left bundle branch block

## 2013-10-31 NOTE — Assessment & Plan Note (Signed)
Patient has discontinued. 

## 2013-10-31 NOTE — Assessment & Plan Note (Signed)
Follow-up vascular surgery. 

## 2013-10-31 NOTE — Patient Instructions (Signed)
Your physician wants you to follow-up in: 6 MONTHS WITH DR CRENSHAW You will receive a reminder letter in the mail two months in advance. If you don't receive a letter, please call our office to schedule the follow-up appointment.   Your physician recommends that you HAVE LAB WORK TODAY 

## 2013-10-31 NOTE — Assessment & Plan Note (Signed)
Patient instructed to followup with primary care. 

## 2013-10-31 NOTE — Assessment & Plan Note (Signed)
Patient now euvolemic. Continue present dose of Lasix. Check potassium and renal function.

## 2013-10-31 NOTE — Assessment & Plan Note (Signed)
Blood pressure controlled. Continue present medications. 

## 2013-10-31 NOTE — Assessment & Plan Note (Signed)
Not on aspirin given need for Coumadin. Continue statin. Check hemoglobin.

## 2013-10-31 NOTE — Assessment & Plan Note (Signed)
Repeat echocardiogram in December 2015.

## 2013-10-31 NOTE — Assessment & Plan Note (Signed)
Continue statin. Followup vascular surgery.

## 2013-10-31 NOTE — Assessment & Plan Note (Signed)
Continue statin. 

## 2013-10-31 NOTE — Assessment & Plan Note (Signed)
Continue ARB and beta blocker. 

## 2013-10-31 NOTE — Assessment & Plan Note (Addendum)
Continue statin. Check lipids and liver. 

## 2013-11-28 ENCOUNTER — Ambulatory Visit (INDEPENDENT_AMBULATORY_CARE_PROVIDER_SITE_OTHER): Payer: Medicare HMO | Admitting: *Deleted

## 2013-11-28 DIAGNOSIS — I059 Rheumatic mitral valve disease, unspecified: Secondary | ICD-10-CM

## 2013-11-28 DIAGNOSIS — I340 Nonrheumatic mitral (valve) insufficiency: Secondary | ICD-10-CM

## 2013-11-28 DIAGNOSIS — I6350 Cerebral infarction due to unspecified occlusion or stenosis of unspecified cerebral artery: Secondary | ICD-10-CM

## 2013-11-28 DIAGNOSIS — Z7901 Long term (current) use of anticoagulants: Secondary | ICD-10-CM

## 2013-11-28 DIAGNOSIS — Z5181 Encounter for therapeutic drug level monitoring: Secondary | ICD-10-CM

## 2013-11-28 LAB — POCT INR: INR: 2.8

## 2014-01-15 ENCOUNTER — Ambulatory Visit (INDEPENDENT_AMBULATORY_CARE_PROVIDER_SITE_OTHER): Payer: Medicare HMO | Admitting: *Deleted

## 2014-01-15 DIAGNOSIS — I6350 Cerebral infarction due to unspecified occlusion or stenosis of unspecified cerebral artery: Secondary | ICD-10-CM

## 2014-01-15 DIAGNOSIS — Z7901 Long term (current) use of anticoagulants: Secondary | ICD-10-CM

## 2014-01-15 DIAGNOSIS — Z5181 Encounter for therapeutic drug level monitoring: Secondary | ICD-10-CM

## 2014-01-15 DIAGNOSIS — I059 Rheumatic mitral valve disease, unspecified: Secondary | ICD-10-CM

## 2014-01-15 DIAGNOSIS — I340 Nonrheumatic mitral (valve) insufficiency: Secondary | ICD-10-CM

## 2014-01-15 LAB — POCT INR: INR: 2.3

## 2014-01-17 ENCOUNTER — Other Ambulatory Visit: Payer: Self-pay

## 2014-01-17 MED ORDER — FUROSEMIDE 20 MG PO TABS: 20.0000 mg | ORAL_TABLET | Freq: Every day | ORAL | Status: DC

## 2014-02-15 ENCOUNTER — Other Ambulatory Visit: Payer: Self-pay | Admitting: Cardiology

## 2014-02-17 ENCOUNTER — Other Ambulatory Visit: Payer: Self-pay

## 2014-02-17 MED ORDER — ISOSORBIDE MONONITRATE ER 30 MG PO TB24: 30.0000 mg | ORAL_TABLET | Freq: Every day | ORAL | Status: DC

## 2014-02-25 ENCOUNTER — Other Ambulatory Visit: Payer: Self-pay

## 2014-02-25 MED ORDER — LEVOTHYROXINE SODIUM 50 MCG PO TABS: 50.0000 ug | ORAL_TABLET | Freq: Every day | ORAL | Status: DC

## 2014-02-26 ENCOUNTER — Ambulatory Visit (INDEPENDENT_AMBULATORY_CARE_PROVIDER_SITE_OTHER): Payer: Medicare HMO | Admitting: *Deleted

## 2014-02-26 DIAGNOSIS — I340 Nonrheumatic mitral (valve) insufficiency: Secondary | ICD-10-CM

## 2014-02-26 DIAGNOSIS — Z7901 Long term (current) use of anticoagulants: Secondary | ICD-10-CM

## 2014-02-26 DIAGNOSIS — Z5181 Encounter for therapeutic drug level monitoring: Secondary | ICD-10-CM

## 2014-02-26 DIAGNOSIS — I6350 Cerebral infarction due to unspecified occlusion or stenosis of unspecified cerebral artery: Secondary | ICD-10-CM

## 2014-02-26 DIAGNOSIS — I059 Rheumatic mitral valve disease, unspecified: Secondary | ICD-10-CM

## 2014-02-26 LAB — POCT INR: INR: 2.4

## 2014-04-08 ENCOUNTER — Other Ambulatory Visit: Payer: Self-pay | Admitting: Cardiology

## 2014-04-09 ENCOUNTER — Ambulatory Visit (INDEPENDENT_AMBULATORY_CARE_PROVIDER_SITE_OTHER): Payer: Medicare HMO | Admitting: *Deleted

## 2014-04-09 DIAGNOSIS — I6350 Cerebral infarction due to unspecified occlusion or stenosis of unspecified cerebral artery: Secondary | ICD-10-CM

## 2014-04-09 DIAGNOSIS — Z5181 Encounter for therapeutic drug level monitoring: Secondary | ICD-10-CM

## 2014-04-09 DIAGNOSIS — Z7901 Long term (current) use of anticoagulants: Secondary | ICD-10-CM

## 2014-04-09 DIAGNOSIS — I340 Nonrheumatic mitral (valve) insufficiency: Secondary | ICD-10-CM

## 2014-04-09 DIAGNOSIS — I639 Cerebral infarction, unspecified: Secondary | ICD-10-CM

## 2014-04-09 LAB — POCT INR: INR: 1.9

## 2014-05-01 ENCOUNTER — Other Ambulatory Visit: Payer: Self-pay

## 2014-05-01 MED ORDER — HYDRALAZINE HCL 50 MG PO TABS: 50.0000 mg | ORAL_TABLET | Freq: Three times a day (TID) | ORAL | Status: DC

## 2014-05-07 ENCOUNTER — Ambulatory Visit (INDEPENDENT_AMBULATORY_CARE_PROVIDER_SITE_OTHER): Payer: Medicare HMO | Admitting: *Deleted

## 2014-05-07 DIAGNOSIS — I340 Nonrheumatic mitral (valve) insufficiency: Secondary | ICD-10-CM

## 2014-05-07 DIAGNOSIS — Z5181 Encounter for therapeutic drug level monitoring: Secondary | ICD-10-CM

## 2014-05-07 DIAGNOSIS — I6350 Cerebral infarction due to unspecified occlusion or stenosis of unspecified cerebral artery: Secondary | ICD-10-CM

## 2014-05-07 DIAGNOSIS — Z7901 Long term (current) use of anticoagulants: Secondary | ICD-10-CM

## 2014-05-07 DIAGNOSIS — I639 Cerebral infarction, unspecified: Secondary | ICD-10-CM

## 2014-05-07 LAB — POCT INR: INR: 2.6

## 2014-05-10 ENCOUNTER — Emergency Department (HOSPITAL_COMMUNITY): Payer: Medicare HMO

## 2014-05-10 ENCOUNTER — Inpatient Hospital Stay (HOSPITAL_COMMUNITY)
Admission: EM | Admit: 2014-05-10 | Discharge: 2014-05-26 | DRG: 871 | Disposition: A | Discharge status: Skilled Nursing Facility | Payer: Medicare HMO | Attending: Internal Medicine | Admitting: Internal Medicine

## 2014-05-10 ENCOUNTER — Encounter (HOSPITAL_COMMUNITY): Payer: Self-pay | Admitting: Emergency Medicine

## 2014-05-10 DIAGNOSIS — R109 Unspecified abdominal pain: Secondary | ICD-10-CM

## 2014-05-10 DIAGNOSIS — Z955 Presence of coronary angioplasty implant and graft: Secondary | ICD-10-CM

## 2014-05-10 DIAGNOSIS — Z8673 Personal history of transient ischemic attack (TIA), and cerebral infarction without residual deficits: Secondary | ICD-10-CM | POA: Diagnosis not present

## 2014-05-10 DIAGNOSIS — Z8249 Family history of ischemic heart disease and other diseases of the circulatory system: Secondary | ICD-10-CM | POA: Diagnosis not present

## 2014-05-10 DIAGNOSIS — A419 Sepsis, unspecified organism: Secondary | ICD-10-CM | POA: Diagnosis present

## 2014-05-10 DIAGNOSIS — Z7901 Long term (current) use of anticoagulants: Secondary | ICD-10-CM | POA: Diagnosis not present

## 2014-05-10 DIAGNOSIS — R001 Bradycardia, unspecified: Secondary | ICD-10-CM | POA: Diagnosis present

## 2014-05-10 DIAGNOSIS — Z951 Presence of aortocoronary bypass graft: Secondary | ICD-10-CM

## 2014-05-10 DIAGNOSIS — E871 Hypo-osmolality and hyponatremia: Secondary | ICD-10-CM | POA: Diagnosis present

## 2014-05-10 DIAGNOSIS — N183 Chronic kidney disease, stage 3 (moderate): Secondary | ICD-10-CM | POA: Diagnosis present

## 2014-05-10 DIAGNOSIS — I248 Other forms of acute ischemic heart disease: Secondary | ICD-10-CM | POA: Diagnosis present

## 2014-05-10 DIAGNOSIS — R0602 Shortness of breath: Secondary | ICD-10-CM | POA: Diagnosis present

## 2014-05-10 DIAGNOSIS — Z87891 Personal history of nicotine dependence: Secondary | ICD-10-CM | POA: Diagnosis not present

## 2014-05-10 DIAGNOSIS — I4891 Unspecified atrial fibrillation: Secondary | ICD-10-CM | POA: Diagnosis present

## 2014-05-10 DIAGNOSIS — R042 Hemoptysis: Secondary | ICD-10-CM | POA: Diagnosis not present

## 2014-05-10 DIAGNOSIS — T45515A Adverse effect of anticoagulants, initial encounter: Secondary | ICD-10-CM | POA: Diagnosis present

## 2014-05-10 DIAGNOSIS — I447 Left bundle-branch block, unspecified: Secondary | ICD-10-CM | POA: Diagnosis present

## 2014-05-10 DIAGNOSIS — J189 Pneumonia, unspecified organism: Secondary | ICD-10-CM | POA: Diagnosis present

## 2014-05-10 DIAGNOSIS — I2510 Atherosclerotic heart disease of native coronary artery without angina pectoris: Secondary | ICD-10-CM | POA: Diagnosis present

## 2014-05-10 DIAGNOSIS — R7401 Elevation of levels of liver transaminase levels: Secondary | ICD-10-CM | POA: Insufficient documentation

## 2014-05-10 DIAGNOSIS — I350 Nonrheumatic aortic (valve) stenosis: Secondary | ICD-10-CM | POA: Diagnosis present

## 2014-05-10 DIAGNOSIS — N179 Acute kidney failure, unspecified: Secondary | ICD-10-CM | POA: Diagnosis present

## 2014-05-10 DIAGNOSIS — I5023 Acute on chronic systolic (congestive) heart failure: Secondary | ICD-10-CM | POA: Diagnosis present

## 2014-05-10 DIAGNOSIS — D689 Coagulation defect, unspecified: Secondary | ICD-10-CM | POA: Diagnosis present

## 2014-05-10 DIAGNOSIS — R631 Polydipsia: Secondary | ICD-10-CM | POA: Diagnosis not present

## 2014-05-10 DIAGNOSIS — T501X5A Adverse effect of loop [high-ceiling] diuretics, initial encounter: Secondary | ICD-10-CM | POA: Diagnosis not present

## 2014-05-10 DIAGNOSIS — K7200 Acute and subacute hepatic failure without coma: Secondary | ICD-10-CM | POA: Diagnosis present

## 2014-05-10 DIAGNOSIS — K5900 Constipation, unspecified: Secondary | ICD-10-CM | POA: Diagnosis present

## 2014-05-10 DIAGNOSIS — I951 Orthostatic hypotension: Secondary | ICD-10-CM | POA: Diagnosis not present

## 2014-05-10 DIAGNOSIS — E785 Hyperlipidemia, unspecified: Secondary | ICD-10-CM | POA: Diagnosis present

## 2014-05-10 DIAGNOSIS — N189 Chronic kidney disease, unspecified: Secondary | ICD-10-CM

## 2014-05-10 DIAGNOSIS — I255 Ischemic cardiomyopathy: Secondary | ICD-10-CM | POA: Diagnosis present

## 2014-05-10 DIAGNOSIS — I129 Hypertensive chronic kidney disease with stage 1 through stage 4 chronic kidney disease, or unspecified chronic kidney disease: Secondary | ICD-10-CM | POA: Diagnosis present

## 2014-05-10 DIAGNOSIS — I679 Cerebrovascular disease, unspecified: Secondary | ICD-10-CM | POA: Diagnosis present

## 2014-05-10 DIAGNOSIS — I739 Peripheral vascular disease, unspecified: Secondary | ICD-10-CM | POA: Diagnosis present

## 2014-05-10 DIAGNOSIS — E039 Hypothyroidism, unspecified: Secondary | ICD-10-CM | POA: Diagnosis present

## 2014-05-10 DIAGNOSIS — R791 Abnormal coagulation profile: Secondary | ICD-10-CM | POA: Diagnosis present

## 2014-05-10 DIAGNOSIS — I25810 Atherosclerosis of coronary artery bypass graft(s) without angina pectoris: Secondary | ICD-10-CM

## 2014-05-10 DIAGNOSIS — R740 Nonspecific elevation of levels of transaminase and lactic acid dehydrogenase [LDH]: Secondary | ICD-10-CM

## 2014-05-10 DIAGNOSIS — A4189 Other specified sepsis: Secondary | ICD-10-CM | POA: Insufficient documentation

## 2014-05-10 DIAGNOSIS — I10 Essential (primary) hypertension: Secondary | ICD-10-CM

## 2014-05-10 LAB — CBC
HCT: 35.3 % — ABNORMAL LOW (ref 39.0–52.0)
HCT: 34.4 % — ABNORMAL LOW (ref 39.0–52.0)
HEMOGLOBIN: 11.7 g/dL — AB (ref 13.0–17.0)
Hemoglobin: 11.9 g/dL — ABNORMAL LOW (ref 13.0–17.0)
MCH: 28.7 pg (ref 26.0–34.0)
MCH: 28.9 pg (ref 26.0–34.0)
MCHC: 33.7 g/dL (ref 30.0–36.0)
MCHC: 34 g/dL (ref 30.0–36.0)
MCV: 85.1 fL (ref 78.0–100.0)
MCV: 84.9 fL (ref 78.0–100.0)
PLATELETS: 159 10*3/uL (ref 150–400)
Platelets: 146 10*3/uL — ABNORMAL LOW (ref 150–400)
RBC: 4.15 MIL/uL — AB (ref 4.22–5.81)
RBC: 4.05 MIL/uL — AB (ref 4.22–5.81)
RDW: 15.3 % (ref 11.5–15.5)
RDW: 15.4 % (ref 11.5–15.5)
WBC: 14.8 10*3/uL — AB (ref 4.0–10.5)
WBC: 15 10*3/uL — ABNORMAL HIGH (ref 4.0–10.5)

## 2014-05-10 LAB — URINALYSIS, ROUTINE W REFLEX MICROSCOPIC
GLUCOSE, UA: NEGATIVE mg/dL
HGB URINE DIPSTICK: NEGATIVE
Ketones, ur: NEGATIVE mg/dL
Leukocytes, UA: NEGATIVE
Nitrite: NEGATIVE
PROTEIN: 30 mg/dL — AB
SPECIFIC GRAVITY, URINE: 1.018 (ref 1.005–1.030)
Urobilinogen, UA: 1 mg/dL (ref 0.0–1.0)
pH: 5 (ref 5.0–8.0)

## 2014-05-10 LAB — BASIC METABOLIC PANEL
Anion gap: 20 — ABNORMAL HIGH (ref 5–15)
BUN: 73 mg/dL — ABNORMAL HIGH (ref 6–23)
CALCIUM: 9.1 mg/dL (ref 8.4–10.5)
CHLORIDE: 88 meq/L — AB (ref 96–112)
CO2: 16 mEq/L — ABNORMAL LOW (ref 19–32)
Creatinine, Ser: 2.86 mg/dL — ABNORMAL HIGH (ref 0.50–1.35)
GFR calc Af Amer: 23 mL/min — ABNORMAL LOW (ref >90)
GFR calc non Af Amer: 20 mL/min — ABNORMAL LOW (ref >90)
Glucose, Bld: 122 mg/dL — ABNORMAL HIGH (ref 70–99)
Potassium: 5 mEq/L (ref 3.7–5.3)
Sodium: 124 mEq/L — ABNORMAL LOW (ref 137–147)

## 2014-05-10 LAB — URINE MICROSCOPIC-ADD ON

## 2014-05-10 LAB — I-STAT TROPONIN, ED: TROPONIN I, POC: 0.56 ng/mL — AB (ref 0.00–0.08)

## 2014-05-10 LAB — CREATININE, SERUM
Creatinine, Ser: 3.03 mg/dL — ABNORMAL HIGH (ref 0.50–1.35)
GFR calc Af Amer: 22 mL/min — ABNORMAL LOW (ref >90)
GFR calc non Af Amer: 19 mL/min — ABNORMAL LOW (ref >90)

## 2014-05-10 LAB — TROPONIN I
TROPONIN I: 2.15 ng/mL — AB (ref <0.30)
TROPONIN I: 3.63 ng/mL — AB (ref <0.30)

## 2014-05-10 LAB — I-STAT CHEM 8, ED
BUN: 76 mg/dL — ABNORMAL HIGH (ref 6–23)
CREATININE: 3.2 mg/dL — AB (ref 0.50–1.35)
Calcium, Ion: 1.07 mmol/L — ABNORMAL LOW (ref 1.13–1.30)
Chloride: 98 mEq/L (ref 96–112)
GLUCOSE: 125 mg/dL — AB (ref 70–99)
HCT: 39 % (ref 39.0–52.0)
HEMOGLOBIN: 13.3 g/dL (ref 13.0–17.0)
Potassium: 4.7 mEq/L (ref 3.7–5.3)
Sodium: 127 mEq/L — ABNORMAL LOW (ref 137–147)
TCO2: 17 mmol/L (ref 0–100)

## 2014-05-10 LAB — PROTIME-INR
INR: 8.53 (ref 0.00–1.49)
Prothrombin Time: 71 seconds — ABNORMAL HIGH (ref 11.6–15.2)

## 2014-05-10 LAB — LACTATE DEHYDROGENASE: LDH: 459 U/L — ABNORMAL HIGH (ref 94–250)

## 2014-05-10 LAB — I-STAT CG4 LACTIC ACID, ED: Lactic Acid, Venous: 3.04 mmol/L — ABNORMAL HIGH (ref 0.5–2.2)

## 2014-05-10 LAB — PRO B NATRIURETIC PEPTIDE: PRO B NATRI PEPTIDE: 63888 pg/mL — AB (ref 0–125)

## 2014-05-10 MED ORDER — WARFARIN - PHARMACIST DOSING INPATIENT: Freq: Every day | Status: DC

## 2014-05-10 MED ORDER — DEXTROSE 5 % IV SOLN
1.0000 g | Freq: Once | INTRAVENOUS | Status: AC
  Administered 2014-05-10: 1 g via INTRAVENOUS
  Filled 2014-05-10: qty 10

## 2014-05-10 MED ORDER — LEVOTHYROXINE SODIUM 50 MCG PO TABS
50.0000 ug | ORAL_TABLET | Freq: Every day | ORAL | Status: DC
  Administered 2014-05-11 – 2014-05-26 (×16): 50 ug via ORAL
  Filled 2014-05-10 (×18): qty 1

## 2014-05-10 MED ORDER — ONDANSETRON HCL 4 MG/2ML IJ SOLN
4.0000 mg | Freq: Once | INTRAMUSCULAR | Status: AC
  Administered 2014-05-10: 4 mg via INTRAVENOUS
  Filled 2014-05-10: qty 2

## 2014-05-10 MED ORDER — SODIUM CHLORIDE 0.9 % IJ SOLN
3.0000 mL | Freq: Two times a day (BID) | INTRAMUSCULAR | Status: DC
  Administered 2014-05-11 – 2014-05-12 (×3): 3 mL via INTRAVENOUS

## 2014-05-10 MED ORDER — ACETAMINOPHEN 325 MG PO TABS: 650.0000 mg | ORAL_TABLET | Freq: Four times a day (QID) | ORAL | Status: DC | PRN

## 2014-05-10 MED ORDER — ONDANSETRON HCL 4 MG/2ML IJ SOLN
4.0000 mg | Freq: Four times a day (QID) | INTRAMUSCULAR | Status: DC | PRN
  Administered 2014-05-16: 4 mg via INTRAVENOUS
  Filled 2014-05-10: qty 2

## 2014-05-10 MED ORDER — SODIUM CHLORIDE 0.9 % IV BOLUS (SEPSIS)
500.0000 mL | Freq: Once | INTRAVENOUS | Status: AC
  Administered 2014-05-10: 500 mL via INTRAVENOUS

## 2014-05-10 MED ORDER — DEXTROSE 5 % IV SOLN
8.0000 mg/h | INTRAVENOUS | Status: DC
  Administered 2014-05-10: 6 mg/h via INTRAVENOUS
  Administered 2014-05-11: 8 mg/h via INTRAVENOUS
  Filled 2014-05-10 (×3): qty 25

## 2014-05-10 MED ORDER — VITAMIN K1 10 MG/ML IJ SOLN
5.0000 mg | Freq: Once | INTRAVENOUS | Status: AC
  Administered 2014-05-10: 5 mg via INTRAVENOUS
  Filled 2014-05-10: qty 0.5

## 2014-05-10 MED ORDER — ONDANSETRON HCL 4 MG/2ML IJ SOLN
4.0000 mg | Freq: Three times a day (TID) | INTRAMUSCULAR | Status: AC | PRN
Start: 2014-05-10 — End: 2014-05-11

## 2014-05-10 MED ORDER — AZITHROMYCIN 500 MG IV SOLR
500.0000 mg | Freq: Once | INTRAVENOUS | Status: AC
  Administered 2014-05-10: 500 mg via INTRAVENOUS
  Filled 2014-05-10: qty 500

## 2014-05-10 MED ORDER — MORPHINE SULFATE 2 MG/ML IJ SOLN: 2.0000 mg | INTRAMUSCULAR | Status: DC | PRN

## 2014-05-10 MED ORDER — ACETAMINOPHEN 650 MG RE SUPP: 650.0000 mg | Freq: Four times a day (QID) | RECTAL | Status: DC | PRN

## 2014-05-10 MED ORDER — IOHEXOL 300 MG/ML  SOLN
25.0000 mL | INTRAMUSCULAR | Status: DC
Start: 2014-05-10 — End: 2014-05-10
  Administered 2014-05-10: 25 mL via ORAL

## 2014-05-10 MED ORDER — ROSUVASTATIN CALCIUM 40 MG PO TABS
40.0000 mg | ORAL_TABLET | Freq: Every day | ORAL | Status: DC
  Administered 2014-05-11 – 2014-05-12 (×2): 40 mg via ORAL
  Filled 2014-05-10 (×5): qty 1

## 2014-05-10 MED ORDER — ONDANSETRON HCL 4 MG PO TABS
4.0000 mg | ORAL_TABLET | Freq: Four times a day (QID) | ORAL | Status: DC | PRN
  Administered 2014-05-12: 4 mg via ORAL
  Filled 2014-05-10: qty 1

## 2014-05-10 MED ORDER — VANCOMYCIN HCL IN DEXTROSE 1-5 GM/200ML-% IV SOLN: 1000.0000 mg | INTRAVENOUS | Status: DC

## 2014-05-10 MED ORDER — FUROSEMIDE 10 MG/ML IJ SOLN
80.0000 mg | Freq: Once | INTRAMUSCULAR | Status: AC
  Administered 2014-05-10: 80 mg via INTRAVENOUS
  Filled 2014-05-10: qty 8

## 2014-05-10 MED ORDER — DEXTROSE 5 % IV SOLN
1.0000 g | INTRAVENOUS | Status: DC
  Administered 2014-05-10 – 2014-05-14 (×5): 1 g via INTRAVENOUS
  Filled 2014-05-10 (×5): qty 1

## 2014-05-10 MED ORDER — VANCOMYCIN HCL IN DEXTROSE 750-5 MG/150ML-% IV SOLN: 750.0000 mg | INTRAVENOUS | Status: DC

## 2014-05-10 MED ORDER — ALUM & MAG HYDROXIDE-SIMETH 200-200-20 MG/5ML PO SUSP: 30.0000 mL | Freq: Four times a day (QID) | ORAL | Status: DC | PRN

## 2014-05-10 MED ORDER — OXYCODONE HCL 5 MG PO TABS
5.0000 mg | ORAL_TABLET | ORAL | Status: DC | PRN
  Administered 2014-05-16: 5 mg via ORAL
  Filled 2014-05-10: qty 1

## 2014-05-10 MED ORDER — ZOLPIDEM TARTRATE 5 MG PO TABS
5.0000 mg | ORAL_TABLET | Freq: Once | ORAL | Status: AC
  Administered 2014-05-10: 5 mg via ORAL
  Filled 2014-05-10: qty 1

## 2014-05-10 NOTE — ED Notes (Signed)
Troponin results given to Dr. Hyacinth MeekerMiller and the primary nurse informed.

## 2014-05-10 NOTE — H&P (Signed)
[facility] History and Physical  Clinton [name] ZOX:[id number] DOB: March 11, 1941 DOA: 05/10/2014  Referring physician:  PCP: Arlan Organ., MD   Chief Complaint: Cough/shortness of breath  HPI: Clinton [name] is a 73 y.o. male with extensive past medical history including coronary artery disease status post coronary artery bypass grafting, ischemic cardiomyopathy having ejection fraction of 20-25% based on transthoracic echocardiogram performed on 06/06/2013, peripheral vascular disease, stage III chronic kidney disease, history of CVA on chronic anticoagulation who presents to the emergency room with complaints of worsening cough and shortness of breath. He states that symptoms started 7 days ago having progressive cough, shortness of breath, scan sputum production, generalized weakness, malaise, fatigue, minimal by mouth intake, having a steep decline in his functional status over the past 24 - 48 hours. Patient states "not having enough energy to get out of bed." he also complains of lower retrosternal chest pain. He denies recent travel or sick contacts. A CT scan revealed right lower lobe pneumonia with associated effusion. Labs also revealed a creatinine of 3.2, BUN of 76, troponin of 0.56 with BNP of 63,888. He had a BNP of 3171 on 08/18/2013. I spoke with Dr. Rennis Golden of cardiology in the ER, who will consult.                                                                                                                                                                                                                               Review of Systems:  Constitutional:  No weight loss, night sweats, positive for fevers, chills, fatigue.  HEENT:  No headaches, Difficulty swallowing,Tooth/dental problems,Sore throat,  No sneezing, itching, ear ache, nasal congestion, post nasal drip,  Cardio-vascular:  Positive for chest pain, Orthopnea, PND, swelling in lower extremities, anasarca,  dizziness, palpitations  GI:  No heartburn, indigestion, abdominal pain, nausea, vomiting, diarrhea, change in bowel habits, loss of appetite  Resp:  Positive for shortness of breath with exertion or at rest. No excess mucus, no productive cough, No non-productive cough, No coughing up of blood.No change in color of mucus.No wheezing.No chest wall deformity  Skin:  no rash or lesions.  GU:  no dysuria, change in color of urine, no urgency or frequency. No flank pain.  Musculoskeletal:  No joint pain or swelling. No decreased range of motion. No back pain.  Psych:  No change in mood or affect. No depression or anxiety. No memory loss.  Past Medical History  Diagnosis Date  . Peripheral arterial disease     a. Ao-bifem bypass w/ 16x8 hemashield dacron graft, R aortorenal bypass w/ 6mm dacron graft;  b. 09/2011 ABI: R 0/87, L 0.92.  . Carotid artery occlusion     a. 12/2002 R CEA;  b. 05/2007 L Carotid stenting;  c. 09/2008 repeat L carotid stenting 2/2 ISR;  d. 09/2011 Carotid U/S: RICA 40-59%, LICA 60-79%, >50 dist LCCA stenosis.  Marland Kitchen CAD (coronary artery disease)     a. CABG 1985;  b. 05/2002 Rotablator/PTCA to ostial LCX and RI;  c. 12/2009 MV: inf and lat infarct w/ minimal mid lateral and basilar ant ischemia, EF 29%->Med Rx.  . Cardiomyopathy, ischemic     a. 06/2010 Cardiac MRI: EF 30%, anterolat, post, inf prior subendocardial infarcts;  b. 08/2012 Echo: EF 30-35%, mild LVH mult wma's, Gr 2 DD;  c. 05/2013 Echo: EF 20-25%, diff HK, mod AS, mild MR, mod dil LA, mild to mod TR, PASP .  . Stroke     remote  . Hypertension   . Hyperlipidemia   . Tobacco abuse   . Anticoagulated on Coumadin   . CKD (chronic kidney disease), stage III   . Chronic systolic CHF (congestive heart failure)     a. 05/2013 EF 20-25%, diff HK.  . Moderate aortic stenosis     a. 05/2013 Echo: at least moderate AS (Valve area 1.07 cm2 - VTI; 1.08cm2 - Vmax).  . Hypothyroidism     a. 05/2013 TSH 50.15 -  synthroid 50 mcg daily started.   Past Surgical History  Procedure Laterality Date  . Carotid endarterectomy    . Coronary artery bypass graft    . Pr vein bypass graft,aorto-fem-pop    . Ureteral artery renal bypass     Social History:  reports that he quit smoking about 11 months ago. His smoking use included Cigarettes. He has a 5 pack-year smoking history. He has never used smokeless tobacco. He reports that he does not drink alcohol or use illicit drugs.  Allergies  Allergen Reactions  . Codeine Phosphate Nausea Only    REACTION: unspecified    Family History  Problem Relation Age of Onset  . CAD       Prior to Admission medications   Medication Sig Start Date End Date Taking? Authorizing Provider  carvedilol (COREG) 12.5 MG tablet Take 1.5 tablets (18.75 mg total) by mouth 2 (two) times daily. 07/29/13  Yes Lewayne Bunting, MD  ezetimibe (ZETIA) 10 MG tablet Take 10 mg by mouth daily.     Yes Historical Provider, MD  furosemide (LASIX) 20 MG tablet Take 1 tablet (20 mg total) by mouth daily. 01/17/14  Yes Lewayne Bunting, MD  hydrALAZINE (APRESOLINE) 50 MG tablet Take 1 tablet (50 mg total) by mouth 3 (three) times daily. 05/01/14  Yes Lewayne Bunting, MD  isosorbide mononitrate (IMDUR) 30 MG 24 hr tablet Take 1 tablet (30 mg total) by mouth daily. 02/17/14  Yes Lewayne Bunting, MD  levothyroxine (SYNTHROID, LEVOTHROID) 50 MCG tablet Take 1 tablet (50 mcg total) by mouth daily before breakfast. 02/25/14  Yes Lewayne Bunting, MD  losartan (COZAAR) 100 MG tablet Take 100 mg by mouth daily.     Yes Historical Provider, MD  rosuvastatin (CRESTOR) 40 MG tablet Take 1 tablet (40 mg total) by mouth daily. 06/19/13  Yes Rosalio Macadamia, NP  warfarin (COUMADIN) 6 MG tablet Take 3-6 mg by mouth  daily. Take 3 mg on tue and sat and 6 mg all other days   Yes Historical Provider, MD  warfarin (COUMADIN) 6 MG tablet take as directed BY COUMADIN CLINIC Patient not taking: Reported on 05/10/2014  04/08/14   Lewayne Bunting, MD   Physical Exam: Filed Vitals:   05/10/14 1107 05/10/14 1312 05/10/14 1333 05/10/14 1407  BP: 102/78 111/86 126/64 126/64  Pulse: 79  78 78  Temp:    98.1 F (36.7 C)  TempSrc:    Oral  Resp: 26 22 28 20   SpO2: 100% 97% 96% 92%    Wt Readings from Last 3 Encounters:  10/31/13 70.489 kg (155 lb 6.4 oz)  07/29/13 76.204 kg (168 lb)  07/03/13 69.908 kg (154 lb 1.9 oz)    General:  chronicallyIll-appearing, toxic, he is awake and alert. Following commands Eyes: PERRL, normal lids, irises & conjunctiva ENT: grossly normal hearing, lips & tongue Neck: no LAD, masses or thyromegaly, Has presence of jugular venous distention Cardiovascular: RRR, no m/r/g. No LE edema. Telemetry: SR, no arrhythmias  Respiratory: Patient having by basilar crackles, positive rhonchi and rales. Appears dyspneic Abdomen: soft, ntnd Skin: no rash or induration seen on limited exam Musculoskeletal: grossly normal tone BUE/BLE Psychiatric: grossly normal mood and affect, speech fluent and appropriate Neurologic: grossly non-focal.          Labs on Admission:  Basic Metabolic Panel:  Recent Labs Lab 05/10/14 0935 05/10/14 1002  NA 124* 127*  K 5.0 4.7  CL 88* 98  CO2 16*  --   GLUCOSE 122* 125*  BUN 73* 76*  CREATININE 2.86* 3.20*  CALCIUM 9.1  --    Liver Function Tests: No results for input(s): AST, ALT, ALKPHOS, BILITOT, PROT, ALBUMIN in the last 168 hours. No results for input(s): LIPASE, AMYLASE in the last 168 hours. No results for input(s): AMMONIA in the last 168 hours. CBC:  Recent Labs Lab 05/10/14 0935 05/10/14 1002  WBC 14.8*  --   HGB 11.9* 13.3  HCT 35.3* 39.0  MCV 85.1  --   PLT 159  --    Cardiac Enzymes: No results for input(s): CKTOTAL, CKMB, CKMBINDEX, TROPONINI in the last 168 hours.  BNP (last 3 results)  Recent Labs  06/05/13 0838 07/29/13 0818 05/10/14 0935  PROBNP 17824.0* 3171.0* 63888.0*   CBG: No results for  input(s): GLUCAP in the last 168 hours.  Radiological Exams on Admission: Ct Abdomen Pelvis Wo Contrast  05/10/2014   CLINICAL DATA:  Right lower quadrant pain for 5 days  EXAM: CT ABDOMEN AND PELVIS WITHOUT CONTRAST  TECHNIQUE: Multidetector CT imaging of the abdomen and pelvis was performed following the standard protocol without IV contrast.  COMPARISON:  None.  FINDINGS: Lung bases show of the left lung base be within normal limits. Right lower lobe pneumonia with associated effusion is seen.  The liver, gallbladder, spleen, adrenal glands and pancreas are within normal limits. The kidneys are well visualized bilaterally and demonstrate renal cystic change. Some of these have attenuation greater than normal fluid and likely represent hemorrhagic cysts. No renal calculi or obstructive changes are seen. Changes consistent with aortobifemoral bypass graft are noted. The appendix is within normal limits. No definitive inflammatory changes are seen.  Bladder is partially distended. Free fluid is noted within the pelvis although no cause towards factors are seen. Small fluid containing left inguinal hernia is noted. Bony structures are within normal limits.  IMPRESSION: Right lower lobe pneumonia with associated effusion.  Free fluid within the pelvis of uncertain etiology.  Chronic changes as described.   Electronically Signed   By: Alcide CleverMark  Lukens M.D.   On: 05/10/2014 13:35   Dg Chest Port 1 View  05/10/2014   CLINICAL DATA:  Weakness.  Shortness of breath for 3 days.  Fatigue.  EXAM: PORTABLE CHEST - 1 VIEW  COMPARISON:  Two-view chest 06/05/2013.  FINDINGS: The heart is mildly enlarged. And chronic interstitial changes are similar to the prior study. This likely reflects some element of edema superimposed on chronic change. There is persistent blunting of the right costophrenic angle suggesting a small effusion or chronic pleural thickening. No focal airspace consolidation is evident.  IMPRESSION: 1.  Cardiomegaly with mild edema superimposed on chronic interstitial coarsening. This likely reflects mild congestive heart failure. 2. Suspect small right pleural effusion.   Electronically Signed   By: Gennette Pachris  Mattern M.D.   On: 05/10/2014 10:26    EKG: Independently reviewed. Did not appear to have acute ischemic changes  Assessment/Plan Principal Problem:   CAP (community acquired pneumonia) Active Problems:   Sepsis   Essential hypertension   CAD, ARTERY BYPASS GRAFT   Peripheral vascular disease   Acute on chronic systolic CHF (congestive heart failure), NYHA class 3   AKI (acute kidney injury)   PNA (pneumonia)   1. Sepsis. Present on admission, evidenced by respiratory rate of 28, white count of 14,800, acute renal failure, elevated troponin, having a lactate of 3.04 with source of infection likely community-acquired pneumonia. Will admit patient to the step down unit, initiate broad-spectrum IV antibiotic therapy with cefepime and vancomycin with pharmacy consultation for renal dosing. Will obtain blood cultures 2 sets, provide supportive care, plan to repeat chest x-ray and lactate in a.m. 2. Community acquire pneumonia. Patient presenting with clinical signs and symptoms suggestive of pneumonia as a CT scan revealed a right lower lobe infiltrate. Patient having multiple comorbidities, presenting with sepsis criteria, will initiate broad-spectrum IV antibiotic therapy with cefepime and vancomycin. Blood cultures have been ordered, provide supportive care and close monitoring in the step down unit. 3. Acute on chronic renal failure. Likely secondary to hypoperfusion of the kidneys likely from combination of decreased cardiac output and dehydration. Showing a creatinine of 3.2. He has history of chronic kidney disease with baseline creatinine near 2.0. I am hesitant to provide more IV fluids as there may be superimposed acute CHF. Will await cardiology consultation. 4. Acute on chronic  systolic congestive heart failure. Patient having history of ischemic cardiomyopathy with his last transthoracic echocardiogram performed on 06/06/2013 showing ejection fraction of 20-25%. Per etiology notes he has refused AICD placement in the past. Chest x-ray showing cardiomegaly with mild edema, labs showing a BNP of 63,888. He had a prior BNP of 3171 on 07/29/2013. He is a patient of Dr. Jens Somrenshaw, cardiology has been consulted.  5. Elevated troponin. Patient having troponin of 0.56 likely secondary to demand ischemia in setting of sepsis. He did report chest pain earlier this week. Will cycle troponins, close monitoring in the step down unit, cardiology consultation placed. 6. Coagulopathy. Patient's on anticoagulation therapy with Coumadin presenting with an INR of 8.53 with PT of 71. He was administered 5 mg of vitamin K in the emergency department. Coumadin held on admission, pharmacy consultation for Coumadin management.  7. History of hypertension. Patient had been on multiple antihypertensive agents at home, blood pressures are soft in the emergency department, will hold all antihypertensive agents for now.  8. Hypothyroidism. Will  check a TSH, continue Synthroid at 50 g by mouth daily 9. DVT prophylaxis. Patient presenting with supratherapeutic INR   Code Status: I discussed CODE STATUS with patient, although he reports not wanting to undergo cardiopulmonary resuscitation he expressed wishes to discuss this further with his son prior to making him a DO NOT RESUSCITATE. Family Communication:  Disposition Plan: will admit patient to the step down unit, anticipate he'll require greater than 2 nights hospitalization  Time spent: 70 minutes  Jeralyn BennettZAMORA, Francisco Eyerly [facility] Pager [phone]

## 2014-05-10 NOTE — ED Notes (Addendum)
SOB today. Weakness/fatigue and no appetite x5 days. NO CP. Hx of MI and cardiac bypass. NO fever but has felt chills

## 2014-05-10 NOTE — ED Provider Notes (Signed)
CSN: [id number]     Arrival date & time 05/10/14  0912 History   First MD Initiated Contact with Patient 05/10/14 0945     Chief Complaint  Patient presents with  . Shortness of Breath  . Fatigue     (Consider location/radiation/quality/duration/timing/severity/associated sxs/prior Treatment) HPI Comments: The patient is a 73 year old male, he has a significant history of coronary disease, multiple vascular bypasses and carotid stenting, he has also a history of congestive heart failure and his last echocardiogram showed ejection fraction of 20-25% performed in 2014. He presents to the hospital with a complaint of generalized weakness and fatigue without any appetite and shortness of breath. This was gradual in onset 5 days ago, it has been persistent and is gradually worsening and is now severe. He feels as though he could hardly get out of bed, he is nauseated but not vomiting, he is able to drink water and Pepsi but unable to drink or eat anything else.  He denies chest pain or back pain and has no swelling of his legs but does complain of some right periumbilical abdominal pain.  Patient is a 73 y.o. male presenting with shortness of breath. The history is provided by the patient, medical records and a relative.  Shortness of Breath  PMD - [facility] [address] - Dr. Kathrynn Running  Past Medical History  Diagnosis Date  . Peripheral arterial disease     a. Ao-bifem bypass w/ 16x8 hemashield dacron graft, R aortorenal bypass w/ 6mm dacron graft;  b. 09/2011 ABI: R 0/87, L 0.92.  . Carotid artery occlusion     a. 12/2002 R CEA;  b. 05/2007 L Carotid stenting;  c. 09/2008 repeat L carotid stenting 2/2 ISR;  d. 09/2011 Carotid U/S: RICA 40-59%, LICA 60-79%, >50 dist LCCA stenosis.  Marland Kitchen CAD (coronary artery disease)     a. CABG 1985;  b. 05/2002 Rotablator/PTCA to ostial LCX and RI;  c. 12/2009 MV: inf and lat infarct w/ minimal mid lateral and basilar ant ischemia, EF 29%->Med Rx.  . Cardiomyopathy, ischemic      a. 06/2010 Cardiac MRI: EF 30%, anterolat, post, inf prior subendocardial infarcts;  b. 08/2012 Echo: EF 30-35%, mild LVH mult wma's, Gr 2 DD;  c. 05/2013 Echo: EF 20-25%, diff HK, mod AS, mild MR, mod dil LA, mild to mod TR, PASP .  . Stroke     remote  . Hypertension   . Hyperlipidemia   . Tobacco abuse   . Anticoagulated on Coumadin   . CKD (chronic kidney disease), stage III   . Chronic systolic CHF (congestive heart failure)     a. 05/2013 EF 20-25%, diff HK.  . Moderate aortic stenosis     a. 05/2013 Echo: at least moderate AS (Valve area 1.07 cm2 - VTI; 1.08cm2 - Vmax).  . Hypothyroidism     a. 05/2013 TSH 50.15 - synthroid 50 mcg daily started.   Past Surgical History  Procedure Laterality Date  . Carotid endarterectomy    . Coronary artery bypass graft    . Pr vein bypass graft,aorto-fem-pop    . Ureteral artery renal bypass     Family History  Problem Relation Age of Onset  . CAD     History  Substance Use Topics  . Smoking status: Former Smoker -- 0.10 packs/day for 50 years    Types: Cigarettes    Quit date: 06/03/2013  . Smokeless tobacco: Never Used     Comment: pt states he smokes 2 cigs  per day       ' i AM CURRENTLY TRYING TO QUIT "   . Alcohol Use: No    Review of Systems  Respiratory: Positive for shortness of breath.   All other systems reviewed and are negative.     Allergies  Codeine phosphate  Home Medications   Prior to Admission medications   Medication Sig Start Date End Date Taking? Authorizing Provider  carvedilol (COREG) 12.5 MG tablet Take 1.5 tablets (18.75 mg total) by mouth 2 (two) times daily. 07/29/13  Yes Lewayne BuntingBrian S Crenshaw, MD  ezetimibe (ZETIA) 10 MG tablet Take 10 mg by mouth daily.     Yes Historical Provider, MD  furosemide (LASIX) 20 MG tablet Take 1 tablet (20 mg total) by mouth daily. 01/17/14  Yes Lewayne BuntingBrian S Crenshaw, MD  hydrALAZINE (APRESOLINE) 50 MG tablet Take 1 tablet (50 mg total) by mouth 3 (three) times daily.  05/01/14  Yes Lewayne BuntingBrian S Crenshaw, MD  isosorbide mononitrate (IMDUR) 30 MG 24 hr tablet Take 1 tablet (30 mg total) by mouth daily. 02/17/14  Yes Lewayne BuntingBrian S Crenshaw, MD  levothyroxine (SYNTHROID, LEVOTHROID) 50 MCG tablet Take 1 tablet (50 mcg total) by mouth daily before breakfast. 02/25/14  Yes Lewayne BuntingBrian S Crenshaw, MD  losartan (COZAAR) 100 MG tablet Take 100 mg by mouth daily.     Yes Historical Provider, MD  rosuvastatin (CRESTOR) 40 MG tablet Take 1 tablet (40 mg total) by mouth daily. 06/19/13  Yes Rosalio MacadamiaLori C Gerhardt, NP  warfarin (COUMADIN) 6 MG tablet Take 3-6 mg by mouth daily. Take 3 mg on tue and sat and 6 mg all other days   Yes Historical Provider, MD  warfarin (COUMADIN) 6 MG tablet take as directed BY COUMADIN CLINIC Patient not taking: Reported on 05/10/2014 04/08/14   Lewayne BuntingBrian S Crenshaw, MD   BP 126/64 mmHg  Pulse 78  Temp(Src) 97.5 F (36.4 C) (Oral)  Resp 28  SpO2 96% Physical Exam  Constitutional: He appears well-developed and well-nourished. He appears distressed.  HENT:  Head: Normocephalic and atraumatic.  Mouth/Throat: Oropharynx is clear and moist. No oropharyngeal exudate.  Eyes: Conjunctivae and EOM are normal. Pupils are equal, round, and reactive to light. Right eye exhibits no discharge. Left eye exhibits no discharge. No scleral icterus.  Neck: Normal range of motion. Neck supple. No JVD present. No thyromegaly present.  Cardiovascular: Regular rhythm and intact distal pulses.  Exam reveals no gallop and no friction rub.   Murmur (systolic) heard. Tachycardia present  Pulmonary/Chest: He is in respiratory distress. He has wheezes. He has no rales.  Increased work of breathing, tachypnea, expiratory wheezing, no rales  Abdominal: Soft. Bowel sounds are normal. He exhibits no distension and no mass. There is tenderness (right periumbilical tenderness, no guarding, no masses).  Musculoskeletal: Normal range of motion. He exhibits no edema or tenderness.  Lymphadenopathy:     He has no cervical adenopathy.  Neurological: He is alert. Coordination normal.  Skin: Skin is warm and dry. No rash noted. No erythema.  Psychiatric: He has a normal mood and affect. His behavior is normal.  Nursing note and vitals reviewed.   ED Course  Procedures (including critical care time) Labs Review Labs Reviewed  CBC - Abnormal; Notable for the following:    WBC 14.8 (*)    RBC 4.15 (*)    Hemoglobin 11.9 (*)    HCT 35.3 (*)    All other components within normal limits  BASIC METABOLIC PANEL - Abnormal; Notable  for the following:    Sodium 124 (*)    Chloride 88 (*)    CO2 16 (*)    Glucose, Bld 122 (*)    BUN 73 (*)    Creatinine, Ser 2.86 (*)    GFR calc non Af Amer 20 (*)    GFR calc Af Amer 23 (*)    Anion gap 20 (*)    All other components within normal limits  PRO B NATRIURETIC PEPTIDE - Abnormal; Notable for the following:    Pro B Natriuretic peptide (BNP) 63888.0 (*)    All other components within normal limits  PROTIME-INR - Abnormal; Notable for the following:    Prothrombin Time 71.0 (*)    INR 8.53 (*)    All other components within normal limits  URINALYSIS, ROUTINE W REFLEX MICROSCOPIC - Abnormal; Notable for the following:    Color, Urine AMBER (*)    APPearance CLOUDY (*)    Bilirubin Urine SMALL (*)    Protein, ur 30 (*)    All other components within normal limits  URINE MICROSCOPIC-ADD ON - Abnormal; Notable for the following:    Bacteria, UA FEW (*)    Casts GRANULAR CAST (*)    All other components within normal limits  I-STAT TROPOININ, ED - Abnormal; Notable for the following:    Troponin i, poc 0.56 (*)    All other components within normal limits  I-STAT CHEM 8, ED - Abnormal; Notable for the following:    Sodium 127 (*)    BUN 76 (*)    Creatinine, Ser 3.20 (*)    Glucose, Bld 125 (*)    Calcium, Ion 1.07 (*)    All other components within normal limits  I-STAT CG4 LACTIC ACID, ED - Abnormal; Notable for the following:     Lactic Acid, Venous 3.04 (*)    All other components within normal limits  URINE CULTURE    Imaging Review Ct Abdomen Pelvis Wo Contrast  05/10/2014   CLINICAL DATA:  Right lower quadrant pain for 5 days  EXAM: CT ABDOMEN AND PELVIS WITHOUT CONTRAST  TECHNIQUE: Multidetector CT imaging of the abdomen and pelvis was performed following the standard protocol without IV contrast.  COMPARISON:  None.  FINDINGS: Lung bases show of the left lung base be within normal limits. Right lower lobe pneumonia with associated effusion is seen.  The liver, gallbladder, spleen, adrenal glands and pancreas are within normal limits. The kidneys are well visualized bilaterally and demonstrate renal cystic change. Some of these have attenuation greater than normal fluid and likely represent hemorrhagic cysts. No renal calculi or obstructive changes are seen. Changes consistent with aortobifemoral bypass graft are noted. The appendix is within normal limits. No definitive inflammatory changes are seen.  Bladder is partially distended. Free fluid is noted within the pelvis although no cause towards factors are seen. Small fluid containing left inguinal hernia is noted. Bony structures are within normal limits.  IMPRESSION: Right lower lobe pneumonia with associated effusion.  Free fluid within the pelvis of uncertain etiology.  Chronic changes as described.   Electronically Signed   By: Alcide CleverMark  Lukens M.D.   On: 05/10/2014 13:35   Dg Chest Port 1 View  05/10/2014   CLINICAL DATA:  Weakness.  Shortness of breath for 3 days.  Fatigue.  EXAM: PORTABLE CHEST - 1 VIEW  COMPARISON:  Two-view chest 06/05/2013.  FINDINGS: The heart is mildly enlarged. And chronic interstitial changes are similar to the prior study.  This likely reflects some element of edema superimposed on chronic change. There is persistent blunting of the right costophrenic angle suggesting a small effusion or chronic pleural thickening. No focal airspace  consolidation is evident.  IMPRESSION: 1. Cardiomegaly with mild edema superimposed on chronic interstitial coarsening. This likely reflects mild congestive heart failure. 2. Suspect small right pleural effusion.   Electronically Signed   By: Gennette Pac M.D.   On: 05/10/2014 10:26     EKG Interpretation   Date/Time:  Saturday May 10 2014 09:28:15 EST Ventricular Rate:  74 PR Interval:  226 QRS Duration: 162 QT Interval:  472 QTC Calculation: 523 R Axis:   -31 Text Interpretation:  Sinus rhythm with 1st degree A-V block with  Premature supraventricular complexes Possible Left atrial enlargement Left  axis deviation Left bundle branch block Abnormal ECG Confirmed by  ZACKOWSKI  MD, SCOTT ([id number]) on 05/10/2014 9:47:02 AM      MDM   Final diagnoses:  Abdominal pain  CAP (community acquired pneumonia)  Acute on chronic systolic congestive heart failure  Acute on chronic renal failure    The patient appears ill, he is tachycardic and hypotensive, his initial blood work shows an elevated troponin, I am unsure if this is a primary cardiac process or related to his diffuse vascular disease, sepsis, hyperkalemia or some other metabolic reason. He is already anticoagulated on Coumadin, check INR, he will need a CT scan of his abdomen to make sure that his abdominal pain is not related to a vascular bypass complication.  INR is very high - hold heparin due to coagulopathy - ARF and hyponatremia - CHF as well.  The pt has multisystem organ failure, he is criticall ill, his BP has responded to IVF, CT pending.  Vit K given.  Discussed with admitting doctor of hospitalist service who will admit to stepdown unit, community-acquired pneumonia also seen on CT scan, no signs of intra-abdominal bleeding.  CRITICAL CARE Performed by: Vida Roller Total critical care time: 35 Critical care time was exclusive of separately billable procedures and treating other patients. Critical care was  necessary to treat or prevent imminent or life-threatening deterioration. Critical care was time spent personally by me on the following activities: development of treatment plan with patient and/or surrogate as well as nursing, discussions with consultants, evaluation of patient's response to treatment, examination of patient, obtaining history from patient or surrogate, ordering and performing treatments and interventions, ordering and review of laboratory studies, ordering and review of radiographic studies, pulse oximetry and re-evaluation of patient's condition.  Meds given in ED:  Medications  iohexol (OMNIPAQUE) 300 MG/ML solution 25 mL (25 mLs Oral Contrast Given 05/10/14 1015)  cefTRIAXone (ROCEPHIN) 1 g in dextrose 5 % 50 mL IVPB (1 g Intravenous New Bag/Given 05/10/14 1402)  azithromycin (ZITHROMAX) 500 mg in dextrose 5 % 250 mL IVPB (not administered)  sodium chloride 0.9 % bolus 500 mL (0 mLs Intravenous Stopped 05/10/14 1045)  ondansetron (ZOFRAN) injection 4 mg (4 mg Intravenous Given 05/10/14 1047)  phytonadione (VITAMIN K) 5 mg in dextrose 5 % 50 mL IVPB (0 mg Intravenous Stopped 05/10/14 1251)      Vida Roller, MD 05/10/14 1408

## 2014-05-10 NOTE — ED Notes (Signed)
Spoke with Selena BattenKim in CT, patient is next in line for scan.

## 2014-05-10 NOTE — ED Notes (Signed)
Patient to CT.

## 2014-05-10 NOTE — Progress Notes (Signed)
CRITICAL VALUE ALERT  Critical value received:  Troponin 2.15  Date of notification:  05/10/2014   Time of notification:    Critical value read back:Yes.    Nurse who received alert:  Jola SchmidtParker, Tametra Ahart Jeanene  MD notified (1st page):  Lenny Pastelom Callahan, NP [facility]  Time of first page:  2035  MD notified (2nd page):  Time of second page:  Responding MD:   Time MD responded:   Dr Freeman(Cardiology) notified @ 2120; no new orders obtained

## 2014-05-10 NOTE — ED Notes (Signed)
Called pharmacy to inquire about Vitamin K. They are to send now.

## 2014-05-10 NOTE — ED Notes (Signed)
Notified Kim in CT that patient is ready.

## 2014-05-10 NOTE — ED Notes (Signed)
Lactic acid results given to Dr. Miller 

## 2014-05-10 NOTE — Progress Notes (Addendum)
ANTIBIOTIC CONSULT NOTE - INITIAL  Pharmacy Consult for Vancomycin Indication: pneumonia  Allergies  Allergen Reactions  . Codeine Phosphate Nausea Only    REACTION: unspecified    Patient Measurements:   Adjusted Body Weight:    Vital Signs: Temp: 98.1 F (36.7 C) (11/14 1407) Temp Source: Oral (11/14 1407) BP: 126/64 mmHg (11/14 1407) Pulse Rate: 78 (11/14 1407) Intake/Output from previous day:   Intake/Output from this shift:    Labs:  Recent Labs  05/10/14 0935 05/10/14 1002  WBC 14.8*  --   HGB 11.9* 13.3  PLT 159  --   CREATININE 2.86* 3.20*   CrCl cannot be calculated (Unknown ideal weight.). No results for input(s): VANCOTROUGH, VANCOPEAK, VANCORANDOM, GENTTROUGH, GENTPEAK, GENTRANDOM, TOBRATROUGH, TOBRAPEAK, TOBRARND, AMIKACINPEAK, AMIKACINTROU, AMIKACIN in the last 72 hours.   Microbiology: No results found for this or any previous visit (from the past 720 hour(s)).  Medical History: Past Medical History  Diagnosis Date  . Peripheral arterial disease     a. Ao-bifem bypass w/ 16x8 hemashield dacron graft, R aortorenal bypass w/ 6mm dacron graft;  b. 09/2011 ABI: R 0/87, L 0.92.  . Carotid artery occlusion     a. 12/2002 R CEA;  b. 05/2007 L Carotid stenting;  c. 09/2008 repeat L carotid stenting 2/2 ISR;  d. 09/2011 Carotid U/S: RICA 40-59%, LICA 60-79%, >50 dist LCCA stenosis.  Marland Kitchen. CAD (coronary artery disease)     a. CABG 1985;  b. 05/2002 Rotablator/PTCA to ostial LCX and RI;  c. 12/2009 MV: inf and lat infarct w/ minimal mid lateral and basilar ant ischemia, EF 29%->Med Rx.  . Cardiomyopathy, ischemic     a. 06/2010 Cardiac MRI: EF 30%, anterolat, post, inf prior subendocardial infarcts;  b. 08/2012 Echo: EF 30-35%, mild LVH mult wma's, Gr 2 DD;  c. 05/2013 Echo: EF 20-25%, diff HK, mod AS, mild MR, mod dil LA, mild to mod TR, PASP 63mmHg.  . Stroke     remote  . Hypertension   . Hyperlipidemia   . Tobacco abuse   . Anticoagulated on Coumadin   . CKD  (chronic kidney disease), stage III   . Chronic systolic CHF (congestive heart failure)     a. 05/2013 EF 20-25%, diff HK.  . Moderate aortic stenosis     a. 05/2013 Echo: at least moderate AS (Valve area 1.07 cm2 - VTI; 1.08cm2 - Vmax).  . Hypothyroidism     a. 05/2013 TSH 50.15 - synthroid 50 mcg daily started.    Medications:  See med rec  Assessment: 73 y/o M admitted 05/10/2014  with SOB, weakness, fatigue, no appetite x 5d. Pharmacy consulted to dose vancomycin, cefepime, and warfarin  PMH: He has a significant history of coronary disease, multiple vascular bypasses and carotid stenting, he has also a history of congestive heart failure and his last echocardiogram showed EF= 20-25%. Patient is on chronic Coumadin. I  Anticoagulation/HEME: Chronic Coumadin for severe coronary and vascular dz with h/o CVA. Admit INR 8.53 treated with Vit K 5mg  IV.   Infectious Disease: CAP. Dirty UA, LA 3.04  Cardiovascular: CAD, ICM, PVD, HTN, HLD. proBNP [id number]. Troponin 0.56 elevated.  Endocrinology: Glucose 125  Gastrointestinal / Nutrition  Neurology: Carotid dz with h/o CVA  Nephrology: CKD. Scr 3.2 (estimated CrCl 20), Ionized Ca 1.07 low, Na 127 low  Pulmonary: tobacco  Goal of Therapy:  Vancomycin trough level 15-20 mcg/ml  Renal adjustment of antibiotics. INR 2-3  Plan:  Rocephin/Azitho given in ED. Vancomycin 1g  IV x 1 then 750mg  IV q24h Cefepime 1 g IV q24h No warfarin today     Crystal S. Merilynn Finlandobertson, PharmD, BCPS Clinical Staff Pharmacist Pager [phone]  Misty Stanleyobertson, Crystal Stillinger 05/10/2014,2:16 PM   Thank you for allowing pharmacy to be a part of this patients care team.  Lovenia KimJulie Delsa Walder Pharm.D., BCPS, AQ-Cardiology Clinical Pharmacist 05/10/2014 4:38 PM Pager: [phone] Phone: [phone]

## 2014-05-10 NOTE — Consult Note (Addendum)
CONSULTATION NOTE  Reason for Consult: Congestive heart failure  Requesting Physician: Dr. Coralyn Pear  Cardiologist: Dr. Stanford Breed  HPI: This is a 73 y.o. male with a past medical history significant for coronary artery disease status post coronary bypassing graft in 1985, subsequent rotablation and PCI , peripheral vascular disease, cerebrovascular disease, hypertension, hyperlipidemia and CHF with EF of 30% by cMRI. Peripheral vascular disease is followed by vascular surgery - he is s/p aortobifem bypass, also significant carotid artery disease. His last myovue was performed in July of 2011. This revealed previous inferior and lateral wall infarct with minimal ischemia in the mid lateral wall and base of the anterior wall. The LV is dilated with EF 29%. Echocardiogram in December 2014 showed an ejection fraction of 20-25%, moderate aortic stenosis, biatrial enlargement, mild to moderate tricuspid regurgitation and mild mitral regurgitation. Seen by Dr Rayann Heman in Feb 2012 for ICD but patient declined. Admitted in December of 2014 with congestive heart failure. Improved with diuresis. TSH noted to be 50 and patient started on Synthroid. Since he was last seen, the patient has dyspnea with more extreme activities but not with routine activities. It is relieved with rest.   He now presents with cough, dyspnea, orthopnea, fevers, chills and fatigue. He also complained of abdominal pain and a CT scan of the abdomen and pelvis which caught the lower lung fields demonstrated a probable right lower lobe pneumonia with associated effusion. BNP is markedly elevated [id number]. Troponin is mildly positive at 0.56. He does appear toxic with tachypnea, leukocytosis and acute on chronic renal failure. He has an elevated lactate level of 3.04. Broad-spectrum antibiotics were started. Cardiology is asked to consult regarding management of heart failure.  PMHx:  Past Medical History  Diagnosis Date  . Peripheral  arterial disease     a. Ao-bifem bypass w/ 16x8 hemashield dacron graft, R aortorenal bypass w/ 42mm dacron graft;  b. 09/2011 ABI: R 0/87, L 0.92.  . Carotid artery occlusion     a. 12/2002 R CEA;  b. 05/2007 L Carotid stenting;  c. 09/2008 repeat L carotid stenting 2/2 ISR;  d. 09/2011 Carotid U/S: RICA 35-32%, LICA 99-24%, >26 dist LCCA stenosis.  Marland Kitchen CAD (coronary artery disease)     a. CABG 1985;  b. 05/2002 Rotablator/PTCA to ostial LCX and RI;  c. 12/2009 MV: inf and lat infarct w/ minimal mid lateral and basilar ant ischemia, EF 29%->Med Rx.  . Cardiomyopathy, ischemic     a. 06/2010 Cardiac MRI: EF 30%, anterolat, post, inf prior subendocardial infarcts;  b. 08/2012 Echo: EF 30-35%, mild LVH mult wma's, Gr 2 DD;  c. 05/2013 Echo: EF 20-25%, diff HK, mod AS, mild MR, mod dil LA, mild to mod TR, PASP 28mmHg.  . Stroke     remote  . Hypertension   . Hyperlipidemia   . Tobacco abuse   . Anticoagulated on Coumadin   . CKD (chronic kidney disease), stage III   . Chronic systolic CHF (congestive heart failure)     a. 05/2013 EF 20-25%, diff HK.  . Moderate aortic stenosis     a. 05/2013 Echo: at least moderate AS (Valve area 1.07 cm2 - VTI; 1.08cm2 - Vmax).  . Hypothyroidism     a. 05/2013 TSH 50.15 - synthroid 50 mcg daily started.   Past Surgical History  Procedure Laterality Date  . Carotid endarterectomy    . Coronary artery bypass graft    . Pr vein bypass graft,aorto-fem-pop    .  Ureteral artery renal bypass      FAMHx: Family History  Problem Relation Age of Onset  . CAD      SOCHx:  reports that he quit smoking about 11 months ago. His smoking use included Cigarettes. He has a 5 pack-year smoking history. He has never used smokeless tobacco. He reports that he does not drink alcohol or use illicit drugs.  ALLERGIES: Allergies  Allergen Reactions  . Codeine Phosphate Nausea Only    REACTION: unspecified    ROS: A comprehensive review of systems was negative except for:  Constitutional: positive for anorexia, chills, fatigue, fevers and malaise Respiratory: positive for cough, dyspnea on exertion, pneumonia and wheezing Cardiovascular: positive for orthopnea and paroxysmal nocturnal dyspnea Gastrointestinal: positive for abdominal pain, nausea and vomiting  HOME MEDICATIONS:   Medication List    ASK your doctor about these medications        carvedilol 12.5 MG tablet  Commonly known as:  COREG  Take 1.5 tablets (18.75 mg total) by mouth 2 (two) times daily.     ezetimibe 10 MG tablet  Commonly known as:  ZETIA  Take 10 mg by mouth daily.     furosemide 20 MG tablet  Commonly known as:  LASIX  Take 1 tablet (20 mg total) by mouth daily.     hydrALAZINE 50 MG tablet  Commonly known as:  APRESOLINE  Take 1 tablet (50 mg total) by mouth 3 (three) times daily.     isosorbide mononitrate 30 MG 24 hr tablet  Commonly known as:  IMDUR  Take 1 tablet (30 mg total) by mouth daily.     levothyroxine 50 MCG tablet  Commonly known as:  SYNTHROID, LEVOTHROID  Take 1 tablet (50 mcg total) by mouth daily before breakfast.     losartan 100 MG tablet  Commonly known as:  COZAAR  Take 100 mg by mouth daily.     rosuvastatin 40 MG tablet  Commonly known as:  CRESTOR  Take 1 tablet (40 mg total) by mouth daily.     warfarin 6 MG tablet  Commonly known as:  COUMADIN  Take 3-6 mg by mouth daily. Take 3 mg on tue and sat and 6 mg all other days     warfarin 6 MG tablet  Commonly known as:  COUMADIN  take as directed BY [place]: Scheduled: . [START ON 05/11/2014] vancomycin  750 mg Intravenous Q24H    VITALS: Blood pressure 135/75, pulse 25, temperature 99 F (37.2 C), temperature source Oral, resp. rate 25, SpO2 93 %.  PHYSICAL EXAM: General appearance: appears older than stated age, mild distress, pale and toxic Neck: JVD - to earlobes 15 cm cm above sternal notch and no carotid bruit Lungs: diminished  breath sounds bilaterally, rales bibasilar and wheezes apex - bilateral Heart: regular rate and rhythm, S1, S2 normal, S3 present and systolic murmur: mid systolic\ 3/6, crescendo at 2nd right intercostal space Abdomen: soft, mild TTP, no rebound, guarding Extremities: extremities normal, atraumatic, no cyanosis or edema Pulses: 1+ pulses Skin: pale, warm, dry Neurologic: Mental status: oriented, but appears ill, follows commands Psych: Feels exhausted  LABS: Results for orders placed or performed during the hospital encounter of 05/10/14 (from the past 48 hour(s))  CBC     Status: Abnormal   Collection Time: 05/10/14  9:35 AM  Result Value Ref Range   WBC 14.8 (H) 4.0 - 10.5 K/uL   RBC 4.15 (L)  4.22 - 5.81 MIL/uL   Hemoglobin 11.9 (L) 13.0 - 17.0 g/dL   HCT 35.3 (L) 39.0 - 52.0 %   MCV 85.1 78.0 - 100.0 fL   MCH 28.7 26.0 - 34.0 pg   MCHC 33.7 30.0 - 36.0 g/dL   RDW 15.3 11.5 - 15.5 %   Platelets 159 150 - 400 K/uL  Basic metabolic panel     Status: Abnormal   Collection Time: 05/10/14  9:35 AM  Result Value Ref Range   Sodium 124 (L) 137 - 147 mEq/L   Potassium 5.0 3.7 - 5.3 mEq/L    Comment: HEMOLYSIS AT THIS LEVEL MAY AFFECT RESULT   Chloride 88 (L) 96 - 112 mEq/L   CO2 16 (L) 19 - 32 mEq/L   Glucose, Bld 122 (H) 70 - 99 mg/dL   BUN 73 (H) 6 - 23 mg/dL   Creatinine, Ser 2.86 (H) 0.50 - 1.35 mg/dL   Calcium 9.1 8.4 - 10.5 mg/dL   GFR calc non Af Amer 20 (L) >90 mL/min   GFR calc Af Amer 23 (L) >90 mL/min    Comment: (NOTE) The eGFR has been calculated using the CKD EPI equation. This calculation has not been validated in all clinical situations. eGFR's persistently <90 mL/min signify possible Chronic Kidney Disease.    Anion gap 20 (H) 5 - 15  BNP (order ONLY if patient complains of dyspnea/SOB AND you have documented it for THIS visit)     Status: Abnormal   Collection Time: 05/10/14  9:35 AM  Result Value Ref Range   Pro B Natriuretic peptide (BNP) 63888.0 (H) 0 -  125 pg/mL  Protime-INR (if pt is taking Coumadin)     Status: Abnormal   Collection Time: 05/10/14  9:35 AM  Result Value Ref Range   Prothrombin Time 71.0 (H) 11.6 - 15.2 seconds    Comment: REPEATED TO VERIFY   INR 8.53 (HH) 0.00 - 1.49    Comment: REPEATED TO VERIFY CRITICAL RESULT CALLED TO, READ BACK BY AND VERIFIED WITH: KATE CLARK,RN AT 1103 05/10/14 BY ZBEECH.   I-stat troponin, ED (not at [facility])     Status: Abnormal   Collection Time: 05/10/14  9:47 AM  Result Value Ref Range   Troponin i, poc 0.56 (HH) 0.00 - 0.08 ng/mL   Comment NOTIFIED PHYSICIAN    Comment 3            Comment: Due to the release kinetics of cTnI, a negative result within the first hours of the onset of symptoms does not rule out myocardial infarction with certainty. If myocardial infarction is still suspected, repeat the test at appropriate intervals.   I-stat chem 8, ed     Status: Abnormal   Collection Time: 05/10/14 10:02 AM  Result Value Ref Range   Sodium 127 (L) 137 - 147 mEq/L   Potassium 4.7 3.7 - 5.3 mEq/L   Chloride 98 96 - 112 mEq/L   BUN 76 (H) 6 - 23 mg/dL   Creatinine, Ser 3.20 (H) 0.50 - 1.35 mg/dL   Glucose, Bld 125 (H) 70 - 99 mg/dL   Calcium, Ion 1.07 (L) 1.13 - 1.30 mmol/L   TCO2 17 0 - 100 mmol/L   Hemoglobin 13.3 13.0 - 17.0 g/dL   HCT 39.0 39.0 - 52.0 %  I-Stat CG4 Lactic Acid, ED     Status: Abnormal   Collection Time: 05/10/14 10:02 AM  Result Value Ref Range   Lactic Acid, Venous 3.04 (H)  0.5 - 2.2 mmol/L  Urinalysis, Routine w reflex microscopic     Status: Abnormal   Collection Time: 05/10/14 10:03 AM  Result Value Ref Range   Color, Urine AMBER (A) YELLOW    Comment: BIOCHEMICALS MAY BE AFFECTED BY COLOR   APPearance CLOUDY (A) CLEAR   Specific Gravity, Urine 1.018 1.005 - 1.030   pH 5.0 5.0 - 8.0   Glucose, UA NEGATIVE NEGATIVE mg/dL   Hgb urine dipstick NEGATIVE NEGATIVE   Bilirubin Urine SMALL (A) NEGATIVE   Ketones, ur NEGATIVE NEGATIVE mg/dL   Protein,  ur 30 (A) NEGATIVE mg/dL   Urobilinogen, UA 1.0 0.0 - 1.0 mg/dL   Nitrite NEGATIVE NEGATIVE   Leukocytes, UA NEGATIVE NEGATIVE  Urine microscopic-add on     Status: Abnormal   Collection Time: 05/10/14 10:03 AM  Result Value Ref Range   WBC, UA 0-2 <3 WBC/hpf   Bacteria, UA FEW (A) RARE   Casts GRANULAR CAST (A) NEGATIVE    Comment: HYALINE CASTS   Urine-Other MUCOUS PRESENT     IMAGING: Ct Abdomen Pelvis Wo Contrast  05/10/2014   CLINICAL DATA:  Right lower quadrant pain for 5 days  EXAM: CT ABDOMEN AND PELVIS WITHOUT CONTRAST  TECHNIQUE: Multidetector CT imaging of the abdomen and pelvis was performed following the standard protocol without IV contrast.  COMPARISON:  None.  FINDINGS: Lung bases show of the left lung base be within normal limits. Right lower lobe pneumonia with associated effusion is seen.  The liver, gallbladder, spleen, adrenal glands and pancreas are within normal limits. The kidneys are well visualized bilaterally and demonstrate renal cystic change. Some of these have attenuation greater than normal fluid and likely represent hemorrhagic cysts. No renal calculi or obstructive changes are seen. Changes consistent with aortobifemoral bypass graft are noted. The appendix is within normal limits. No definitive inflammatory changes are seen.  Bladder is partially distended. Free fluid is noted within the pelvis although no cause towards factors are seen. Small fluid containing left inguinal hernia is noted. Bony structures are within normal limits.  IMPRESSION: Right lower lobe pneumonia with associated effusion.  Free fluid within the pelvis of uncertain etiology.  Chronic changes as described.   Electronically Signed   By: Inez Catalina M.D.   On: 05/10/2014 13:35   Dg Chest Port 1 View  05/10/2014   CLINICAL DATA:  Weakness.  Shortness of breath for 3 days.  Fatigue.  EXAM: PORTABLE CHEST - 1 VIEW  COMPARISON:  Two-view chest 06/05/2013.  FINDINGS: The heart is mildly  enlarged. And chronic interstitial changes are similar to the prior study. This likely reflects some element of edema superimposed on chronic change. There is persistent blunting of the right costophrenic angle suggesting a small effusion or chronic pleural thickening. No focal airspace consolidation is evident.  IMPRESSION: 1. Cardiomegaly with mild edema superimposed on chronic interstitial coarsening. This likely reflects mild congestive heart failure. 2. Suspect small right pleural effusion.   Electronically Signed   By: Lawrence Santiago M.D.   On: 05/10/2014 10:26    HOSPITAL DIAGNOSES: Principal Problem:   CAP (community acquired pneumonia) Active Problems:   Essential hypertension   CAD, ARTERY BYPASS GRAFT   Peripheral vascular disease   Acute on chronic systolic CHF (congestive heart failure), NYHA class 3   AKI (acute kidney injury)   Sepsis   PNA (pneumonia)   IMPRESSION: 1. Acute on chronic systolic congestive heart failure, LVEF 20-25% ( DECLINED AICD) 2. Pneumonia 3. Sepsis  4. Acute kidney injury on chronic kidney disease 5. Coronary artery disease with prior CABG and numerous PCI 6. Peripheral artery disease with carotid artery stents and aortobifem bypass 7. Chronic anticoagulation on warfarin 8. Moderate aortic stenosis 9. Demand ischemia  RECOMMENDATION: 1. Acute on chronic systolic congestive heart failure-clear evidence of volume overload with markedly elevated JVP to the earlobe. There are decreased breath sounds, expiratory wheezes and rales at the bases with illness to percussion at the right base in the area of pneumonia with overlying effusion. I recommend starting Lasix drip as he's gotten IV fluids in the emergency department. 80 mg IV 1 then 6 mg per hour continuous infusion. 2. Pneumonia/sepsis-continue broad-spectrum antibiotics 3. Acute kidney injury on chronic kidney disease-this may represent cardiorenal syndrome, rather than dehydration as an explanation  for his elevated creatinine. We will need to follow creatinine closely with diuresis 4. Chronic warfarin anticoagulation-recommend pharmacy management as INR is supraherapeutic. Hold warfarin. Indication seems to be peripheral arterial disease or perhaps a low EF. He does have a history of prior stroke. 5. Moderate aortic stenosis-this will complicate management. Adequate preload as necessary for cardiac output. 6. Demand ischemia-small troponin elevation in the setting of heart failure likely secondary to demand ischemia or subendocardial ischemia from heart failure.   Cardiology will follow along closely with you. Thanks for the consult.  Time Spent Directly with Patient: 45 minutes  Pixie Casino, MD, [facility] Attending Cardiologist [facility]  Chadwin Fury C 05/10/2014, 4:20 PM

## 2014-05-10 NOTE — ED Notes (Signed)
Patient began feeling nauseated while drinking contrast. Order placed for IV zofran.

## 2014-05-11 ENCOUNTER — Inpatient Hospital Stay (HOSPITAL_COMMUNITY): Payer: Medicare HMO

## 2014-05-11 DIAGNOSIS — I248 Other forms of acute ischemic heart disease: Secondary | ICD-10-CM

## 2014-05-11 DIAGNOSIS — I5023 Acute on chronic systolic (congestive) heart failure: Secondary | ICD-10-CM

## 2014-05-11 LAB — CBC
HCT: 34.9 % — ABNORMAL LOW (ref 39.0–52.0)
Hemoglobin: 11.8 g/dL — ABNORMAL LOW (ref 13.0–17.0)
MCH: 28.8 pg (ref 26.0–34.0)
MCHC: 33.8 g/dL (ref 30.0–36.0)
MCV: 85.1 fL (ref 78.0–100.0)
PLATELETS: 150 10*3/uL (ref 150–400)
RBC: 4.1 MIL/uL — AB (ref 4.22–5.81)
RDW: 15.4 % (ref 11.5–15.5)
WBC: 16.6 10*3/uL — ABNORMAL HIGH (ref 4.0–10.5)

## 2014-05-11 LAB — BASIC METABOLIC PANEL
Anion gap: 22 — ABNORMAL HIGH (ref 5–15)
BUN: 83 mg/dL — ABNORMAL HIGH (ref 6–23)
CALCIUM: 8.6 mg/dL (ref 8.4–10.5)
CO2: 13 meq/L — AB (ref 19–32)
Chloride: 91 mEq/L — ABNORMAL LOW (ref 96–112)
Creatinine, Ser: 3.11 mg/dL — ABNORMAL HIGH (ref 0.50–1.35)
GFR calc Af Amer: 21 mL/min — ABNORMAL LOW (ref >90)
GFR calc non Af Amer: 18 mL/min — ABNORMAL LOW (ref >90)
Glucose, Bld: 91 mg/dL (ref 70–99)
Potassium: 4.5 mEq/L (ref 3.7–5.3)
Sodium: 126 mEq/L — ABNORMAL LOW (ref 137–147)

## 2014-05-11 LAB — URINE CULTURE
COLONY COUNT: NO GROWTH
Culture: NO GROWTH

## 2014-05-11 LAB — HEPARIN LEVEL (UNFRACTIONATED): Heparin Unfractionated: <0.1 IU/mL — ABNORMAL LOW (ref 0.30–0.70)

## 2014-05-11 LAB — PROTIME-INR
INR: 2.01 — ABNORMAL HIGH (ref 0.00–1.49)
Prothrombin Time: 23 seconds — ABNORMAL HIGH (ref 11.6–15.2)

## 2014-05-11 LAB — TROPONIN I: Troponin I: 2.96 ng/mL (ref <0.30)

## 2014-05-11 MED ORDER — VANCOMYCIN HCL IN DEXTROSE 750-5 MG/150ML-% IV SOLN
750.0000 mg | INTRAVENOUS | Status: AC
  Administered 2014-05-12 – 2014-05-14 (×3): 750 mg via INTRAVENOUS
  Filled 2014-05-11 (×4): qty 150

## 2014-05-11 MED ORDER — WARFARIN SODIUM 6 MG PO TABS
6.0000 mg | ORAL_TABLET | Freq: Once | ORAL | Status: DC
  Filled 2014-05-11: qty 1

## 2014-05-11 MED ORDER — WARFARIN SODIUM 5 MG PO TABS
5.0000 mg | ORAL_TABLET | Freq: Once | ORAL | Status: AC
  Administered 2014-05-11: 5 mg via ORAL
  Filled 2014-05-11: qty 1

## 2014-05-11 MED ORDER — FUROSEMIDE 10 MG/ML IJ SOLN
80.0000 mg | Freq: Once | INTRAMUSCULAR | Status: AC
  Administered 2014-05-11: 80 mg via INTRAVENOUS

## 2014-05-11 MED ORDER — SODIUM CHLORIDE 0.9 % IJ SOLN
10.0000 mL | Freq: Two times a day (BID) | INTRAMUSCULAR | Status: DC
  Administered 2014-05-11 – 2014-05-14 (×5): 10 mL
  Administered 2014-05-15: 30 mL
  Administered 2014-05-15 – 2014-05-23 (×5): 10 mL

## 2014-05-11 MED ORDER — ZOLPIDEM TARTRATE 5 MG PO TABS
5.0000 mg | ORAL_TABLET | Freq: Once | ORAL | Status: AC
  Administered 2014-05-11: 5 mg via ORAL
  Filled 2014-05-11: qty 1

## 2014-05-11 MED ORDER — HEPARIN (PORCINE) IN NACL 100-0.45 UNIT/ML-% IJ SOLN
1750.0000 [IU]/h | INTRAMUSCULAR | Status: DC
  Administered 2014-05-11: 800 [IU]/h via INTRAVENOUS
  Administered 2014-05-12: 1250 [IU]/h via INTRAVENOUS
  Administered 2014-05-13: 1750 [IU]/h via INTRAVENOUS
  Filled 2014-05-11 (×5): qty 250

## 2014-05-11 MED ORDER — WARFARIN - PHARMACIST DOSING INPATIENT
Freq: Every day | Status: DC
  Administered 2014-05-11 – 2014-05-17 (×3)

## 2014-05-11 MED ORDER — FUROSEMIDE 10 MG/ML IJ SOLN
INTRAMUSCULAR | Status: AC
  Filled 2014-05-11: qty 8

## 2014-05-11 MED ORDER — SODIUM CHLORIDE 0.9 % IJ SOLN
10.0000 mL | INTRAMUSCULAR | Status: DC | PRN
  Administered 2014-05-16 – 2014-05-18 (×4): 20 mL
  Administered 2014-05-18: 10 mL
  Administered 2014-05-18: 20 mL
  Administered 2014-05-19 – 2014-05-25 (×10): 10 mL
  Filled 2014-05-11 (×17): qty 40

## 2014-05-11 MED ORDER — VANCOMYCIN HCL IN DEXTROSE 1-5 GM/200ML-% IV SOLN
1000.0000 mg | INTRAVENOUS | Status: AC
  Administered 2014-05-11: 1000 mg via INTRAVENOUS
  Filled 2014-05-11: qty 200

## 2014-05-11 NOTE — Progress Notes (Addendum)
DAILY PROGRESS NOTE  Subjective:  Breathing has improved slightly. Only diuresed 700 cc overnight on Lasix gtts. Creatinine appears to be stable. Troponin peaked at 3.63 and is trending down. Leukocytosis is increasing. INR now down to 2.01 today.  Objective:  Temp:  [97.6 F (36.4 C)-99 F (37.2 C)] 97.6 F (36.4 C) (11/15 0751) Pulse Rate:  [25-83] 78 (11/15 0800) Resp:  [19-29] 19 (11/15 0800) BP: (66-142)/(46-126) 142/126 mmHg (11/15 0800) SpO2:  [92 %-100 %] 99 % (11/15 0800) Weight:  [150 lb 5.7 oz (68.2 kg)-153 lb 3.5 oz (69.5 kg)] 153 lb 3.5 oz (69.5 kg) (11/15 0438) Weight change:   Intake/Output from previous day: 11/14 0701 - 11/15 0700 In: 315.2 [P.O.:240; I.V.:75.2] Out: 975 [Urine:975]  Intake/Output from this shift:    Medications: Current Facility-Administered Medications  Medication Dose Route Frequency Provider Last Rate Last Dose  . acetaminophen (TYLENOL) tablet 650 mg  650 mg Oral Q6H PRN Kelvin Cellar, MD       Or  . acetaminophen (TYLENOL) suppository 650 mg  650 mg Rectal Q6H PRN Kelvin Cellar, MD      . alum & mag hydroxide-simeth (MAALOX/MYLANTA) 200-200-20 MG/5ML suspension 30 mL  30 mL Oral Q6H PRN Kelvin Cellar, MD      . ceFEPIme (MAXIPIME) 1 g in dextrose 5 % 50 mL IVPB  1 g Intravenous Q24H Kelvin Cellar, MD   1 g at 05/10/14 1749  . furosemide (LASIX) 250 mg in dextrose 5 % 250 mL (1 mg/mL) infusion  6 mg/hr Intravenous Continuous Pixie Casino, MD 6 mL/hr at 05/10/14 1728 6 mg/hr at 05/10/14 1728  . levothyroxine (SYNTHROID, LEVOTHROID) tablet 50 mcg  50 mcg Oral QAC breakfast Kelvin Cellar, MD   50 mcg at 05/11/14 0851  . morphine 2 MG/ML injection 2 mg  2 mg Intravenous Q4H PRN Kelvin Cellar, MD      . ondansetron (ZOFRAN) tablet 4 mg  4 mg Oral Q6H PRN Kelvin Cellar, MD       Or  . ondansetron (ZOFRAN) injection 4 mg  4 mg Intravenous Q6H PRN Kelvin Cellar, MD      . oxyCODONE (Oxy IR/ROXICODONE) immediate release  tablet 5 mg  5 mg Oral Q4H PRN Kelvin Cellar, MD      . rosuvastatin (CRESTOR) tablet 40 mg  40 mg Oral Daily Kelvin Cellar, MD   40 mg at 05/10/14 2000  . sodium chloride 0.9 % injection 3 mL  3 mL Intravenous Q12H Kelvin Cellar, MD   3 mL at 05/10/14 2253  . Warfarin - Pharmacist Dosing Inpatient   Does not apply Becker, MD        Physical Exam: General appearance: alert, appears older than stated age and mild distress Neck: JVD - to earlobe cm above sternal notch and no carotid bruit Lungs: diminished breath sounds bilaterally, dullness to percussion LLL and rales bibasilar Heart: regular rate and rhythm, S1, S2 normal and S3 present Abdomen: soft, mild TTP, no rebound or guarding, hepatomegaly Extremities: extremities normal, atraumatic, no cyanosis or edema Pulses: 2+ and symmetric Skin: Skin color, texture, turgor normal. No rashes or lesions Neurologic: Grossly normal .  Lab Results: Results for orders placed or performed during the hospital encounter of 05/10/14 (from the past 48 hour(s))  CBC     Status: Abnormal   Collection Time: 05/10/14  9:35 AM  Result Value Ref Range   WBC 14.8 (H) 4.0 - 10.5 K/uL   RBC 4.15 (  L) 4.22 - 5.81 MIL/uL   Hemoglobin 11.9 (L) 13.0 - 17.0 g/dL   HCT 35.3 (L) 39.0 - 52.0 %   MCV 85.1 78.0 - 100.0 fL   MCH 28.7 26.0 - 34.0 pg   MCHC 33.7 30.0 - 36.0 g/dL   RDW 15.3 11.5 - 15.5 %   Platelets 159 150 - 400 K/uL  Basic metabolic panel     Status: Abnormal   Collection Time: 05/10/14  9:35 AM  Result Value Ref Range   Sodium 124 (L) 137 - 147 mEq/L   Potassium 5.0 3.7 - 5.3 mEq/L    Comment: HEMOLYSIS AT THIS LEVEL MAY AFFECT RESULT   Chloride 88 (L) 96 - 112 mEq/L   CO2 16 (L) 19 - 32 mEq/L   Glucose, Bld 122 (H) 70 - 99 mg/dL   BUN 73 (H) 6 - 23 mg/dL   Creatinine, Ser 2.86 (H) 0.50 - 1.35 mg/dL   Calcium 9.1 8.4 - 10.5 mg/dL   GFR calc non Af Amer 20 (L) >90 mL/min   GFR calc Af Amer 23 (L) >90 mL/min    Comment:  (NOTE) The eGFR has been calculated using the CKD EPI equation. This calculation has not been validated in all clinical situations. eGFR's persistently <90 mL/min signify possible Chronic Kidney Disease.    Anion gap 20 (H) 5 - 15  BNP (order ONLY if patient complains of dyspnea/SOB AND you have documented it for THIS visit)     Status: Abnormal   Collection Time: 05/10/14  9:35 AM  Result Value Ref Range   Pro B Natriuretic peptide (BNP) 63888.0 (H) 0 - 125 pg/mL  Protime-INR (if pt is taking Coumadin)     Status: Abnormal   Collection Time: 05/10/14  9:35 AM  Result Value Ref Range   Prothrombin Time 71.0 (H) 11.6 - 15.2 seconds    Comment: REPEATED TO VERIFY   INR 8.53 (HH) 0.00 - 1.49    Comment: REPEATED TO VERIFY CRITICAL RESULT CALLED TO, READ BACK BY AND VERIFIED WITH: KATE CLARK,RN AT 1103 05/10/14 BY ZBEECH.   I-stat troponin, ED (not at [facility])     Status: Abnormal   Collection Time: 05/10/14  9:47 AM  Result Value Ref Range   Troponin i, poc 0.56 (HH) 0.00 - 0.08 ng/mL   Comment NOTIFIED PHYSICIAN    Comment 3            Comment: Due to the release kinetics of cTnI, a negative result within the first hours of the onset of symptoms does not rule out myocardial infarction with certainty. If myocardial infarction is still suspected, repeat the test at appropriate intervals.   I-stat chem 8, ed     Status: Abnormal   Collection Time: 05/10/14 10:02 AM  Result Value Ref Range   Sodium 127 (L) 137 - 147 mEq/L   Potassium 4.7 3.7 - 5.3 mEq/L   Chloride 98 96 - 112 mEq/L   BUN 76 (H) 6 - 23 mg/dL   Creatinine, Ser 3.20 (H) 0.50 - 1.35 mg/dL   Glucose, Bld 125 (H) 70 - 99 mg/dL   Calcium, Ion 1.07 (L) 1.13 - 1.30 mmol/L   TCO2 17 0 - 100 mmol/L   Hemoglobin 13.3 13.0 - 17.0 g/dL   HCT 39.0 39.0 - 52.0 %  I-Stat CG4 Lactic Acid, ED     Status: Abnormal   Collection Time: 05/10/14 10:02 AM  Result Value Ref Range   Lactic Acid, Venous 3.04 (  H) 0.5 - 2.2 mmol/L    Urinalysis, Routine w reflex microscopic     Status: Abnormal   Collection Time: 05/10/14 10:03 AM  Result Value Ref Range   Color, Urine AMBER (A) YELLOW    Comment: BIOCHEMICALS MAY BE AFFECTED BY COLOR   APPearance CLOUDY (A) CLEAR   Specific Gravity, Urine 1.018 1.005 - 1.030   pH 5.0 5.0 - 8.0   Glucose, UA NEGATIVE NEGATIVE mg/dL   Hgb urine dipstick NEGATIVE NEGATIVE   Bilirubin Urine SMALL (A) NEGATIVE   Ketones, ur NEGATIVE NEGATIVE mg/dL   Protein, ur 30 (A) NEGATIVE mg/dL   Urobilinogen, UA 1.0 0.0 - 1.0 mg/dL   Nitrite NEGATIVE NEGATIVE   Leukocytes, UA NEGATIVE NEGATIVE  Urine microscopic-add on     Status: Abnormal   Collection Time: 05/10/14 10:03 AM  Result Value Ref Range   WBC, UA 0-2 <3 WBC/hpf   Bacteria, UA FEW (A) RARE   Casts GRANULAR CAST (A) NEGATIVE    Comment: HYALINE CASTS   Urine-Other MUCOUS PRESENT   CBC     Status: Abnormal   Collection Time: 05/10/14  7:10 PM  Result Value Ref Range   WBC 15.0 (H) 4.0 - 10.5 K/uL   RBC 4.05 (L) 4.22 - 5.81 MIL/uL   Hemoglobin 11.7 (L) 13.0 - 17.0 g/dL   HCT 34.4 (L) 39.0 - 52.0 %   MCV 84.9 78.0 - 100.0 fL   MCH 28.9 26.0 - 34.0 pg   MCHC 34.0 30.0 - 36.0 g/dL   RDW 15.4 11.5 - 15.5 %   Platelets 146 (L) 150 - 400 K/uL  Creatinine, serum     Status: Abnormal   Collection Time: 05/10/14  7:10 PM  Result Value Ref Range   Creatinine, Ser 3.03 (H) 0.50 - 1.35 mg/dL   GFR calc non Af Amer 19 (L) >90 mL/min   GFR calc Af Amer 22 (L) >90 mL/min    Comment: (NOTE) The eGFR has been calculated using the CKD EPI equation. This calculation has not been validated in all clinical situations. eGFR's persistently <90 mL/min signify possible Chronic Kidney Disease.   Lactate dehydrogenase     Status: Abnormal   Collection Time: 05/10/14  7:10 PM  Result Value Ref Range   LDH 459 (H) 94 - 250 U/L  Troponin I (q 6hr x 3)     Status: Abnormal   Collection Time: 05/10/14  7:10 PM  Result Value Ref Range    Troponin I 2.15 (HH) <0.30 ng/mL    Comment:        Due to the release kinetics of cTnI, a negative result within the first hours of the onset of symptoms does not rule out myocardial infarction with certainty. If myocardial infarction is still suspected, repeat the test at appropriate intervals. CRITICAL RESULT CALLED TO, READ BACK BY AND VERIFIED WITH: A.[facility] 2028 05/10/14 M.CAMPBELL   Troponin I (q 6hr x 3)     Status: Abnormal   Collection Time: 05/10/14 11:10 PM  Result Value Ref Range   Troponin I 3.63 (HH) <0.30 ng/mL    Comment:        Due to the release kinetics of cTnI, a negative result within the first hours of the onset of symptoms does not rule out myocardial infarction with certainty. If myocardial infarction is still suspected, repeat the test at appropriate intervals. CRITICAL VALUE NOTED.  VALUE IS CONSISTENT WITH PREVIOUSLY REPORTED AND CALLED VALUE.   Troponin I (q 6hr  x 3)     Status: Abnormal   Collection Time: 05/11/14  4:54 AM  Result Value Ref Range   Troponin I 2.96 (HH) <0.30 ng/mL    Comment:        Due to the release kinetics of cTnI, a negative result within the first hours of the onset of symptoms does not rule out myocardial infarction with certainty. If myocardial infarction is still suspected, repeat the test at appropriate intervals. CRITICAL VALUE NOTED.  VALUE IS CONSISTENT WITH PREVIOUSLY REPORTED AND CALLED VALUE.   Basic metabolic panel     Status: Abnormal   Collection Time: 05/11/14  4:54 AM  Result Value Ref Range   Sodium 126 (L) 137 - 147 mEq/L   Potassium 4.5 3.7 - 5.3 mEq/L   Chloride 91 (L) 96 - 112 mEq/L   CO2 13 (L) 19 - 32 mEq/L   Glucose, Bld 91 70 - 99 mg/dL   BUN 83 (H) 6 - 23 mg/dL   Creatinine, Ser 3.11 (H) 0.50 - 1.35 mg/dL   Calcium 8.6 8.4 - 10.5 mg/dL   GFR calc non Af Amer 18 (L) >90 mL/min   GFR calc Af Amer 21 (L) >90 mL/min    Comment: (NOTE) The eGFR has been calculated using the CKD EPI  equation. This calculation has not been validated in all clinical situations. eGFR's persistently <90 mL/min signify possible Chronic Kidney Disease.    Anion gap 22 (H) 5 - 15  Protime-INR     Status: Abnormal   Collection Time: 05/11/14  4:54 AM  Result Value Ref Range   Prothrombin Time 23.0 (H) 11.6 - 15.2 seconds   INR 2.01 (H) 0.00 - 1.49  CBC     Status: Abnormal   Collection Time: 05/11/14  4:54 AM  Result Value Ref Range   WBC 16.6 (H) 4.0 - 10.5 K/uL   RBC 4.10 (L) 4.22 - 5.81 MIL/uL   Hemoglobin 11.8 (L) 13.0 - 17.0 g/dL   HCT 34.9 (L) 39.0 - 52.0 %   MCV 85.1 78.0 - 100.0 fL   MCH 28.8 26.0 - 34.0 pg   MCHC 33.8 30.0 - 36.0 g/dL   RDW 15.4 11.5 - 15.5 %   Platelets 150 150 - 400 K/uL    Imaging: Ct Abdomen Pelvis Wo Contrast  05/10/2014   CLINICAL DATA:  Right lower quadrant pain for 5 days  EXAM: CT ABDOMEN AND PELVIS WITHOUT CONTRAST  TECHNIQUE: Multidetector CT imaging of the abdomen and pelvis was performed following the standard protocol without IV contrast.  COMPARISON:  None.  FINDINGS: Lung bases show of the left lung base be within normal limits. Right lower lobe pneumonia with associated effusion is seen.  The liver, gallbladder, spleen, adrenal glands and pancreas are within normal limits. The kidneys are well visualized bilaterally and demonstrate renal cystic change. Some of these have attenuation greater than normal fluid and likely represent hemorrhagic cysts. No renal calculi or obstructive changes are seen. Changes consistent with aortobifemoral bypass graft are noted. The appendix is within normal limits. No definitive inflammatory changes are seen.  Bladder is partially distended. Free fluid is noted within the pelvis although no cause towards factors are seen. Small fluid containing left inguinal hernia is noted. Bony structures are within normal limits.  IMPRESSION: Right lower lobe pneumonia with associated effusion.  Free fluid within the pelvis of  uncertain etiology.  Chronic changes as described.   Electronically Signed   By: Linus Mako.D.  On: 05/10/2014 13:35   Dg Chest Port 1 View  05/10/2014   CLINICAL DATA:  Weakness.  Shortness of breath for 3 days.  Fatigue.  EXAM: PORTABLE CHEST - 1 VIEW  COMPARISON:  Two-view chest 06/05/2013.  FINDINGS: The heart is mildly enlarged. And chronic interstitial changes are similar to the prior study. This likely reflects some element of edema superimposed on chronic change. There is persistent blunting of the right costophrenic angle suggesting a small effusion or chronic pleural thickening. No focal airspace consolidation is evident.  IMPRESSION: 1. Cardiomegaly with mild edema superimposed on chronic interstitial coarsening. This likely reflects mild congestive heart failure. 2. Suspect small right pleural effusion.   Electronically Signed   By: Lawrence Santiago M.D.   On: 05/10/2014 10:26    Assessment:  1. Principal Problem: 2.   CAP (community acquired pneumonia) 3. Active Problems: 4.   Essential hypertension 5.   CAD, ARTERY BYPASS GRAFT 6.   Peripheral vascular disease 7.   Acute on chronic systolic CHF (congestive heart failure), NYHA class 3 8.   AKI (acute kidney injury) 9.   Sepsis 10.   PNA (pneumonia) 11.   Demand ischemia 12.   Plan:  1. Acute on chronic systolic congestive heart failure- breathing improved somewhat, however, urine output is slow - bolus 80 mg IV x 1 and increase lasix gtts to 8 mg/hr today. Creatinine is stable. Remains hyponatremic. 2. Pneumonia/sepsis-continue broad-spectrum antibiotics, leukocytosis is increasing. 3. Acute kidney injury on chronic kidney disease-this may represent cardiorenal syndrome, rather than dehydration as an explanation for his elevated creatinine. We will need to follow creatinine closely with diuresis 4. Chronic warfarin anticoagulation-recommend pharmacy management as INR is supraherapeutic. Holding warfarin. INR today is  2.01 5. Moderate aortic stenosis-this will complicate management. Adequate preload as necessary for cardiac output. 6. Demand ischemia- troponin elevated to peak of 3.63 and is trending down - may represent a small NSTEMI versus demand ischemia. Recommend starting IV heparin today as INR is around 2 and warfarin being held.   Time Spent Directly with Patient:  15 minutes  Length of Stay:  LOS: 1 day   Pixie Casino, MD, [facility] Attending Cardiologist [facility]  Baya Lentz C 05/11/2014, 10:09 AM

## 2014-05-11 NOTE — Progress Notes (Signed)
ANTICOAGULATION CONSULT NOTE - Follow Up Consult  Pharmacy Consult for  Heparin Indication: Severe peripheral arterial disease  Allergies  Allergen Reactions  . Codeine Phosphate Nausea Only    REACTION: unspecified    Patient Measurements: Height: 5\' 11"  (180.3 cm) Weight: 153 lb 3.5 oz (69.5 kg) IBW/kg (Calculated) : 75.3  Vital Signs: Temp: 97.7 F (36.5 C) (11/15 1600) Temp Source: Oral (11/15 1600) BP: 124/52 mmHg (11/15 2019) Pulse Rate: 82 (11/15 2019)  Labs:  Recent Labs  05/10/14 0935 05/10/14 1002 05/10/14 1910 05/10/14 2310 05/11/14 0454 05/11/14 1915  HGB 11.9* 13.3 11.7*  --  11.8*  --   HCT 35.3* 39.0 34.4*  --  34.9*  --   PLT 159  --  146*  --  150  --   LABPROT 71.0*  --   --   --  23.0*  --   INR 8.53*  --   --   --  2.01*  --   HEPARINUNFRC  --   --   --   --   --  <0.10*  CREATININE 2.86* 3.20* 3.03*  --  3.11*  --   TROPONINI  --   --  2.15* 3.63* 2.96*  --     Estimated Creatinine Clearance: 20.5 mL/min (by C-G formula based on Cr of 3.11).   Medications:  Prescriptions prior to admission  Medication Sig Dispense Refill Last Dose  . carvedilol (COREG) 12.5 MG tablet Take 1.5 tablets (18.75 mg total) by mouth 2 (two) times daily. 90 tablet 12 05/10/2014 at 0830  . ezetimibe (ZETIA) 10 MG tablet Take 10 mg by mouth daily.     05/09/2014 at Unknown time  . furosemide (LASIX) 20 MG tablet Take 1 tablet (20 mg total) by mouth daily. 30 tablet 6 05/09/2014 at Unknown time  . hydrALAZINE (APRESOLINE) 50 MG tablet Take 1 tablet (50 mg total) by mouth 3 (three) times daily. 90 tablet 2 05/10/2014 at Unknown time  . isosorbide mononitrate (IMDUR) 30 MG 24 hr tablet Take 1 tablet (30 mg total) by mouth daily. 30 tablet 6 05/10/2014 at Unknown time  . levothyroxine (SYNTHROID, LEVOTHROID) 50 MCG tablet Take 1 tablet (50 mcg total) by mouth daily before breakfast. 30 tablet 3 05/10/2014 at Unknown time  . losartan (COZAAR) 100 MG tablet Take 100 mg by  mouth daily.     05/10/2014 at Unknown time  . rosuvastatin (CRESTOR) 40 MG tablet Take 1 tablet (40 mg total) by mouth daily. 30 tablet 6 05/09/2014 at Unknown time  . warfarin (COUMADIN) 6 MG tablet Take 3-6 mg by mouth daily. Take 3 mg on tue and sat and 6 mg all other days   05/09/2014 at Unknown time    Assessment: 73 y/o M admitted 05/10/2014 with SOB, weakness, fatigue, no appetite x 5d. Pharmacy consulted to dose vancomycin, cefepime, and warfarin. > now bridging with heparin given INR 2.01  AC: Chronic Coumadin for severe coronary/vascular disease with h/o CVA. On admission, INR was SUPRAtherapeutic at 8.53 and patient received Vit K 5 mg IV x 1. INR today has significantly trended down to low end of goal at 2.01. Plan to restart Coumadin and start heparin since INR low and will likely take some time for INR to respond to Coumadin. H/H remains low but stable, plt are also low but stable with no reported s/s bleeding.  Heparin level undetectable on 800 units/hr infusing without problem.  Goal of Therapy:  Heparin level 0.3-0.7 units/ml Monitor platelets  by anticoagulation protocol: Yes   Plan:  1. Increase heparin to 1000 units/hr 2. Daily heparin level, cbc  Thank you for allowing pharmacy to be a part of this patients care team.  Lovenia KimJulie Aamirah Salmi Pharm.D., BCPS, AQ-Cardiology Clinical Pharmacist 05/11/2014 8:37 PM Pager: [phone] Phone: [phone]

## 2014-05-11 NOTE — Progress Notes (Signed)
[facility] PROGRESS NOTE  Clinton [name] ZOX:[id number] DOB: 08/24/1940 DOA: 05/10/2014 PCP: Arlan Organ., MD  Assessment/Plan: 1. Sepsis -Present on admission, evidenced by respiratory of 28, white count of 14,800, lactate 3.04, source of infection likely to be community-acquired pneumonia. -Patient was started on empiric IV antibiotic therapy with cefepime and vancomycin. -CT scan on admission showing the presence of right lower lobe pneumonia, pending repeat chest x-ray this morning -Blood cultures are pending  2.  Community-acquired pneumonia -Patient presenting with clinical signs and symptoms suggestive of pneumonia, started on broad-spectrum IV and microbial therapy with cefepime and vancomycin -Initial CT scan showing presence of right lower lobe infiltrate, making aspiration also a possibility. -Will consult speech pathology -Pending repeat chest x-ray -follow up on cultures  3.  Acute on chronic systolic congestive heart failure. -Issue with history of ischemic heart lab the having ejection fraction of 20-25% per transthoracic echocardiogram performed on 06/06/2013. -Having evidence of acute decompensated congestive heart failure with labs show an elevated BNP of 81,191. -Cardiology consulted patient started on Lasix drip. -Diuresed approximately 925 ML'[name] overnight -Likely improving  4.  Probable demand ischemia -Patient presenting septic, troponins peaking at 3.63 overnight. This morning he denied chest pain -Cardiology following  5.  Coagulopathy.  -Presented with supratherapeutic INR of 8.53, administered vitamin K now having INR of 2.01. -Pharmacy consulted for Coumadin management  6.  Acute on chronic renal failure. -Patient having minimal by mouth intake over the past several days, presenting with sepsis in setting of community-acquired pneumonia, also having acute decompensated congestive heart failure -Baseline creatinine near 2.0. Current  creatinine 3.11 -Will continue to follow kidney function as he is being diuresed.  Code Status: currently full code, Clinton [name] CODE STATUS with family members today Family Communication:  Disposition Plan: continue IV diuresis, broad-spectrum IV antibiotic therapy, close monitoring in the step down unit   Consultants:  cardiology   Antibiotics:  Vancomycin (started on 05/10/2014)  Cefepime (started on 05/10/2014)  HPI/Subjective: Patient is a pleasant 73 year old gentle man with extensive class medical history including coronary artery disease, status post artery bypass grafting, ischemic heart mop the, chronic systolic congestive heart failure having ejection fraction of 20-25% based on transthoracic echocardiogram that was performed on 06/06/2013, admitted to the medicine service on 05/10/2014 as he presented with complaints of shortness of breath, cough, having a steep functional decline in the past 24-48 hours. Initial workup revealed presence of right lower lobe pneumonia seen on CT scan. He was also found to be in acute on chronic renal failure, and had elevated troponin initially of 0.56. His BNP was also elevated at 63,888. Case was discussed with cardiology given his extensive cardiac history and presence of acute on chronic systolic congestive heart failure. He was admitted to the step down unit started on broad-spectrum IV antibiotic therapy with vancomycin and cefepime. Cardiology started IV diuresis. Overnight his troponins peaking at 3.63. He did not complain of chest pain.  Objective: Filed Vitals:   05/11/14 0800  BP: 142/126  Pulse: 78  Temp:   Resp: 19    Intake/Output Summary (Last 24 hours) at 05/11/14 0958 Last data filed at 05/11/14 0648  Gross per 24 hour  Intake  315.2 ml  Output    975 ml  Net -659.8 ml   Filed Weights   05/10/14 1648 05/11/14 0438  Weight: 68.2 kg (150 lb 5.7 oz) 69.5 kg (153 lb 3.5 oz)    Exam:   General:  Ill-appearing although  seems better today, awake alert, following commands  Cardiovascular: has ongoing jugular venous distention, regular rate rhythm, did not appreciate lower extremity edema  Respiratory: bibasilar crackles, positive rhonchi, positive rales  Abdomen: soft nontender nondistended  Musculoskeletal: no extremity edema  Data Reviewed: Basic Metabolic Panel:  Recent Labs Lab 05/10/14 0935 05/10/14 1002 05/10/14 1910 05/11/14 0454  NA 124* 127*  --  126*  K 5.0 4.7  --  4.5  CL 88* 98  --  91*  CO2 16*  --   --  13*  GLUCOSE 122* 125*  --  91  BUN 73* 76*  --  83*  CREATININE 2.86* 3.20* 3.03* 3.11*  CALCIUM 9.1  --   --  8.6   Liver Function Tests: No results for input([name]): AST, ALT, ALKPHOS, BILITOT, PROT, ALBUMIN in the last 168 hours. No results for input([name]): LIPASE, AMYLASE in the last 168 hours. No results for input([name]): AMMONIA in the last 168 hours. CBC:  Recent Labs Lab 05/10/14 0935 05/10/14 1002 05/10/14 1910 05/11/14 0454  WBC 14.8*  --  15.0* 16.6*  HGB 11.9* 13.3 11.7* 11.8*  HCT 35.3* 39.0 34.4* 34.9*  MCV 85.1  --  84.9 85.1  PLT 159  --  146* 150   Cardiac Enzymes:  Recent Labs Lab 05/10/14 1910 05/10/14 2310 05/11/14 0454  TROPONINI 2.15* 3.63* 2.96*   BNP (last 3 results)  Recent Labs  06/05/13 0838 07/29/13 0818 05/10/14 0935  PROBNP 17824.0* 3171.0* 63888.0*   CBG: No results for input([name]): GLUCAP in the last 168 hours.  No results found for this or any previous visit (from the past 240 hour([name])).   Studies: Ct Abdomen Pelvis Wo Contrast  05/10/2014   CLINICAL DATA:  Right lower quadrant pain for 5 days  EXAM: CT ABDOMEN AND PELVIS WITHOUT CONTRAST  TECHNIQUE: Multidetector CT imaging of the abdomen and pelvis was performed following the standard protocol without IV contrast.  COMPARISON:  None.  FINDINGS: Lung bases show of the left lung base be within normal limits. Right lower lobe pneumonia with associated effusion is seen.  The liver,  gallbladder, spleen, adrenal glands and pancreas are within normal limits. The kidneys are well visualized bilaterally and demonstrate renal cystic change. Some of these have attenuation greater than normal fluid and likely represent hemorrhagic cysts. No renal calculi or obstructive changes are seen. Changes consistent with aortobifemoral bypass graft are noted. The appendix is within normal limits. No definitive inflammatory changes are seen.  Bladder is partially distended. Free fluid is noted within the pelvis although no cause towards factors are seen. Small fluid containing left inguinal hernia is noted. Bony structures are within normal limits.  IMPRESSION: Right lower lobe pneumonia with associated effusion.  Free fluid within the pelvis of uncertain etiology.  Chronic changes as described.   Electronically Signed   By: Alcide CleverMark  Lukens M.D.   On: 05/10/2014 13:35   Dg Chest Port 1 View  05/10/2014   CLINICAL DATA:  Weakness.  Shortness of breath for 3 days.  Fatigue.  EXAM: PORTABLE CHEST - 1 VIEW  COMPARISON:  Two-view chest 06/05/2013.  FINDINGS: The heart is mildly enlarged. And chronic interstitial changes are similar to the prior study. This likely reflects some element of edema superimposed on chronic change. There is persistent blunting of the right costophrenic angle suggesting a small effusion or chronic pleural thickening. No focal airspace consolidation is evident.  IMPRESSION: 1. Cardiomegaly with mild edema superimposed on chronic interstitial coarsening. This likely  reflects mild congestive heart failure. 2. Suspect small right pleural effusion.   Electronically Signed   By: Gennette Pachris  Mattern M.D.   On: 05/10/2014 10:26    Scheduled Meds: . ceFEPime (MAXIPIME) IV  1 g Intravenous Q24H  . levothyroxine  50 mcg Oral QAC breakfast  . rosuvastatin  40 mg Oral Daily  . sodium chloride  3 mL Intravenous Q12H  . Warfarin - Pharmacist Dosing Inpatient   Does not apply q1800   Continuous  Infusions: . furosemide (LASIX) infusion 6 mg/hr (05/10/14 1728)    Principal Problem:   CAP (community acquired pneumonia) Active Problems:   Sepsis   Essential hypertension   CAD, ARTERY BYPASS GRAFT   Peripheral vascular disease   Acute on chronic systolic CHF (congestive heart failure), NYHA class 3   AKI (acute kidney injury)   PNA (pneumonia)   Demand ischemia    Time spent: 35 minutes    Jeralyn BennettZAMORA, Clinton [name]  [facility] Pager [phone]. If 7PM-7AM, please contact night-coverage at [email or web address], password [email or web address] 05/11/2014, 9:58 AM  LOS: 1 day

## 2014-05-11 NOTE — Progress Notes (Addendum)
ANTIBIOTIC and ANTICOAGULATION CONSULT NOTE   Pharmacy Consult for Cefepime/Vancomycin + Coumadin/Heparin bridge Indication: pneumonia + severe coronary/vascular disease with h/o CVA  Allergies  Allergen Reactions  . Codeine Phosphate Nausea Only    REACTION: unspecified    Patient Measurements: Height: 5\' 11"  (180.3 cm) Weight: 153 lb 3.5 oz (69.5 kg) IBW/kg (Calculated) : 75.3   Vital Signs: Temp: 97.6 F (36.4 C) (11/15 0751) Temp Source: Oral (11/15 0751) BP: 142/126 mmHg (11/15 0800) Pulse Rate: 78 (11/15 0800) Intake/Output from previous day: 11/14 0701 - 11/15 0700 In: 315.2 [P.O.:240; I.V.:75.2] Out: 975 [Urine:975] Intake/Output from this shift: Total I/O In: 120 [P.O.:120] Out: 175 [Urine:175]  Labs:  Recent Labs  05/10/14 0935 05/10/14 1002 05/10/14 1910 05/11/14 0454  WBC 14.8*  --  15.0* 16.6*  HGB 11.9* 13.3 11.7* 11.8*  PLT 159  --  146* 150  CREATININE 2.86* 3.20* 3.03* 3.11*   Estimated Creatinine Clearance: 20.5 mL/min (by C-G formula based on Cr of 3.11). No results for input(s): VANCOTROUGH, VANCOPEAK, VANCORANDOM, GENTTROUGH, GENTPEAK, GENTRANDOM, TOBRATROUGH, TOBRAPEAK, TOBRARND, AMIKACINPEAK, AMIKACINTROU, AMIKACIN in the last 72 hours.   Microbiology: No results found for this or any previous visit (from the past 720 hour(s)).  Medical History: Past Medical History  Diagnosis Date  . Peripheral arterial disease     a. Ao-bifem bypass w/ 16x8 hemashield dacron graft, R aortorenal bypass w/ 6mm dacron graft;  b. 09/2011 ABI: R 0/87, L 0.92.  . Carotid artery occlusion     a. 12/2002 R CEA;  b. 05/2007 L Carotid stenting;  c. 09/2008 repeat L carotid stenting 2/2 ISR;  d. 09/2011 Carotid U/S: RICA 40-59%, LICA 60-79%, >50 dist LCCA stenosis.  Marland Kitchen. CAD (coronary artery disease)     a. CABG 1985;  b. 05/2002 Rotablator/PTCA to ostial LCX and RI;  c. 12/2009 MV: inf and lat infarct w/ minimal mid lateral and basilar ant ischemia, EF 29%->Med Rx.  .  Cardiomyopathy, ischemic     a. 06/2010 Cardiac MRI: EF 30%, anterolat, post, inf prior subendocardial infarcts;  b. 08/2012 Echo: EF 30-35%, mild LVH mult wma's, Gr 2 DD;  c. 05/2013 Echo: EF 20-25%, diff HK, mod AS, mild MR, mod dil LA, mild to mod TR, PASP 63mmHg.  . Stroke     remote  . Hypertension   . Hyperlipidemia   . Tobacco abuse   . Anticoagulated on Coumadin   . CKD (chronic kidney disease), stage III   . Chronic systolic CHF (congestive heart failure)     a. 05/2013 EF 20-25%, diff HK.  . Moderate aortic stenosis     a. 05/2013 Echo: at least moderate AS (Valve area 1.07 cm2 - VTI; 1.08cm2 - Vmax).  . Hypothyroidism     a. 05/2013 TSH 50.15 - synthroid 50 mcg daily started.    Medications:  See med rec  Assessment: 73 y/o M admitted 05/10/2014  with SOB, weakness, fatigue, no appetite x 5d. Pharmacy consulted to dose vancomycin, cefepime, and warfarin.   AC: Chronic Coumadin for severe coronary/vascular disease with h/o CVA. On admission, INR was SUPRAtherapeutic at 8.53 and patient received Vit K 5 mg IV x 1. INR today has significantly trended down to low end of goal at 2.01. Plan to restart Coumadin and start heparin since INR low and will likely take some time for INR to respond to Coumadin. H/H remains low but stable, plt are also low but stable with no reported s/s bleeding.  Home dose: 6 mg  PO daily except 3 mg PO on Tues/Sat (last home dose taken on 11/14) Last Coumadin clinic visit on 11/11 -- INR therapeutic at 2.6 on current dose  ID: Vanc + cefepime started for CAP. Vanc was never started yesterday so will initiate today. Patient remains afebrile, WBC trending up to 16.6, SCr continues to trend up to 3.11 (CrCl ~20 ml/min).   Cefepime 11/14>> Vanc 11/15>>  11/14 BCx2>> 11/14 UCx>>  Goal of Therapy:  Vancomycin trough level 15-20 mcg/ml  Renal adjustment of antibiotics INR 2-3 Heparin level: 0.3-0.7  Plan:  - Initiate Vancomycin 1g IV x 1 then 750mg  IV  q24h - Continue Cefepime 1 g IV q24h - Monitor renal function, temp, WBC, C&S, VT as indicated  - Start hep gtt at 800 u/hr - 8 hour HL - Coumadin 5 mg PO x 1 tonight - Daily HL/INR/CBC - Monitor for s/s bleeding    Margie BilletErika K. von Vajna, PharmD Clinical Pharmacist - Resident Pager: [phone] Pharmacy: [phone] 05/11/2014 10:46 AM

## 2014-05-11 NOTE — Plan of Care (Signed)
Problem: Phase I Progression Outcomes Goal: Dyspnea controlled at rest Outcome: Progressing Goal: Pain controlled with appropriate interventions Outcome: Completed/Met Date Met:  05/11/14 Goal: OOB as tolerated unless otherwise ordered Outcome: Completed/Met Date Met:  05/11/14 Goal: Confirm chest x-ray completed Outcome: Completed/Met Date Met:  05/11/14 Goal: Voiding-avoid urinary catheter unless indicated Outcome: Completed/Met Date Met:  05/11/14

## 2014-05-12 LAB — BASIC METABOLIC PANEL
Anion gap: 21 — ABNORMAL HIGH (ref 5–15)
BUN: 92 mg/dL — AB (ref 6–23)
CHLORIDE: 89 meq/L — AB (ref 96–112)
CO2: 16 meq/L — AB (ref 19–32)
Calcium: 7.9 mg/dL — ABNORMAL LOW (ref 8.4–10.5)
Creatinine, Ser: 3.4 mg/dL — ABNORMAL HIGH (ref 0.50–1.35)
GFR calc non Af Amer: 16 mL/min — ABNORMAL LOW (ref >90)
GFR, EST AFRICAN AMERICAN: 19 mL/min — AB (ref >90)
Glucose, Bld: 91 mg/dL (ref 70–99)
Potassium: 3.9 mEq/L (ref 3.7–5.3)
Sodium: 126 mEq/L — ABNORMAL LOW (ref 137–147)

## 2014-05-12 LAB — PROTIME-INR
INR: 1.78 — ABNORMAL HIGH (ref 0.00–1.49)
Prothrombin Time: 20.9 seconds — ABNORMAL HIGH (ref 11.6–15.2)

## 2014-05-12 LAB — CBC
HEMATOCRIT: 31.7 % — AB (ref 39.0–52.0)
HEMOGLOBIN: 11.1 g/dL — AB (ref 13.0–17.0)
MCH: 29.1 pg (ref 26.0–34.0)
MCHC: 35 g/dL (ref 30.0–36.0)
MCV: 83.2 fL (ref 78.0–100.0)
Platelets: 148 10*3/uL — ABNORMAL LOW (ref 150–400)
RBC: 3.81 MIL/uL — ABNORMAL LOW (ref 4.22–5.81)
RDW: 15.2 % (ref 11.5–15.5)
WBC: 12.6 10*3/uL — ABNORMAL HIGH (ref 4.0–10.5)

## 2014-05-12 LAB — HEPARIN LEVEL (UNFRACTIONATED)
Heparin Unfractionated: <0.1 IU/mL — ABNORMAL LOW (ref 0.30–0.70)
Heparin Unfractionated: <0.1 IU/mL — ABNORMAL LOW (ref 0.30–0.70)
Heparin Unfractionated: 0.11 IU/mL — ABNORMAL LOW (ref 0.30–0.70)

## 2014-05-12 MED ORDER — FUROSEMIDE 10 MG/ML IJ SOLN
60.0000 mg | Freq: Two times a day (BID) | INTRAMUSCULAR | Status: DC
  Administered 2014-05-12 (×2): 60 mg via INTRAVENOUS
  Filled 2014-05-12 (×4): qty 6

## 2014-05-12 MED ORDER — ENSURE COMPLETE PO LIQD
237.0000 mL | Freq: Two times a day (BID) | ORAL | Status: DC
  Administered 2014-05-12 – 2014-05-25 (×11): 237 mL via ORAL

## 2014-05-12 MED ORDER — WARFARIN SODIUM 6 MG PO TABS
6.0000 mg | ORAL_TABLET | Freq: Once | ORAL | Status: AC
  Administered 2014-05-12: 6 mg via ORAL
  Filled 2014-05-12: qty 1

## 2014-05-12 NOTE — Progress Notes (Signed)
Utilization review completed. Tammie Ellsworth, RN, BSN. 

## 2014-05-12 NOTE — Plan of Care (Signed)
Problem: Phase II Progression Outcomes Goal: Encourage coughing & deep breathing Outcome: Progressing Reviewed TCDB exercises with pt. Goal: Wean O2 if indicated Outcome: Progressing Goal: Pain controlled Outcome: Progressing Goal: Progress activity as tolerated unless otherwise ordered Outcome: Progressing Goal: Tolerating diet Outcome: Progressing Encourage good po intake.

## 2014-05-12 NOTE — Plan of Care (Signed)
Problem: Phase II Progression Outcomes Goal: Tolerating diet Outcome: Progressing     

## 2014-05-12 NOTE — Progress Notes (Signed)
ANTICOAGULATION CONSULT NOTE  Pharmacy Consult for Heparin  Indication: Severe vascular disease  Allergies  Allergen Reactions  . Codeine Phosphate Nausea And Vomiting    Patient Measurements: Height: 5\' 11"  (180.3 cm) Weight: 149 lb 11.1 oz (67.9 kg) IBW/kg (Calculated) : 75.3  Vital Signs: Temp: 98.8 F (37.1 C) (11/16 2019) Temp Source: Oral (11/16 2019) BP: 111/77 mmHg (11/16 2200) Pulse Rate: 69 (11/16 2200)  Labs:  Recent Labs  05/10/14 0935  05/10/14 1910 05/10/14 2310 05/11/14 0454  05/12/14 0335 05/12/14 1231 05/12/14 2248  HGB 11.9*  < > 11.7*  --  11.8*  --  11.1*  --   --   HCT 35.3*  < > 34.4*  --  34.9*  --  31.7*  --   --   PLT 159  --  146*  --  150  --  148*  --   --   LABPROT 71.0*  --   --   --  23.0*  --  20.9*  --   --   INR 8.53*  --   --   --  2.01*  --  1.78*  --   --   HEPARINUNFRC  --   --   --   --   --   < > <0.10* <0.10* 0.11*  CREATININE 2.86*  < > 3.03*  --  3.11*  --  3.40*  --   --   TROPONINI  --   --  2.15* 3.63* 2.96*  --   --   --   --   < > = values in this interval not displayed.  Estimated Creatinine Clearance: 18.3 mL/min (by C-G formula based on Cr of 3.4).  Assessment: 73 yo male with severe PAD, Coumadin on hold, for heparin  Goal of Therapy:  Heparin level 0.3-0.7 units/ml Monitor platelets by anticoagulation protocol: Yes   Plan:  Increase Heparin 1750 units/hr Follow-up am labs.  Eddie CandleAbbott, Danie Diehl Vernon 05/12/2014,11:22 PM

## 2014-05-12 NOTE — Progress Notes (Signed)
ANTICOAGULATION CONSULT NOTE - Follow Up Consult  Pharmacy Consult for  Heparin Indication: Severe peripheral arterial disease  Allergies  Allergen Reactions  . Codeine Phosphate Nausea And Vomiting    Patient Measurements: Height: 5\' 11"  (180.3 cm) Weight: 149 lb 11.1 oz (67.9 kg) IBW/kg (Calculated) : 75.3  Vital Signs: Temp: 98 F (36.7 C) (11/16 1151) Temp Source: Oral (11/16 1151) BP: 94/64 mmHg (11/16 1151) Pulse Rate: 68 (11/16 1000)  Labs:  Recent Labs  05/10/14 0935  05/10/14 1910 05/10/14 2310 05/11/14 0454 05/11/14 1915 05/12/14 0335 05/12/14 1231  HGB 11.9*  < > 11.7*  --  11.8*  --  11.1*  --   HCT 35.3*  < > 34.4*  --  34.9*  --  31.7*  --   PLT 159  --  146*  --  150  --  148*  --   LABPROT 71.0*  --   --   --  23.0*  --  20.9*  --   INR 8.53*  --   --   --  2.01*  --  1.78*  --   HEPARINUNFRC  --   --   --   --   --  <0.10* <0.10* <0.10*  CREATININE 2.86*  < > 3.03*  --  3.11*  --  3.40*  --   TROPONINI  --   --  2.15* 3.63* 2.96*  --   --   --   < > = values in this interval not displayed.  Estimated Creatinine Clearance: 18.3 mL/min (by C-G formula based on Cr of 3.4).   Medications:  Prescriptions prior to admission  Medication Sig Dispense Refill Last Dose  . carvedilol (COREG) 12.5 MG tablet Take 1.5 tablets (18.75 mg total) by mouth 2 (two) times daily. 90 tablet 12 05/10/2014 at 0830  . ezetimibe (ZETIA) 10 MG tablet Take 10 mg by mouth daily.     05/09/2014 at Unknown time  . furosemide (LASIX) 20 MG tablet Take 1 tablet (20 mg total) by mouth daily. 30 tablet 6 05/09/2014 at Unknown time  . hydrALAZINE (APRESOLINE) 50 MG tablet Take 1 tablet (50 mg total) by mouth 3 (three) times daily. 90 tablet 2 05/10/2014 at Unknown time  . isosorbide mononitrate (IMDUR) 30 MG 24 hr tablet Take 1 tablet (30 mg total) by mouth daily. 30 tablet 6 05/10/2014 at Unknown time  . levothyroxine (SYNTHROID, LEVOTHROID) 50 MCG tablet Take 1 tablet (50 mcg  total) by mouth daily before breakfast. 30 tablet 3 05/10/2014 at Unknown time  . losartan (COZAAR) 100 MG tablet Take 100 mg by mouth daily.     05/10/2014 at Unknown time  . rosuvastatin (CRESTOR) 40 MG tablet Take 1 tablet (40 mg total) by mouth daily. 30 tablet 6 05/09/2014 at Unknown time  . warfarin (COUMADIN) 6 MG tablet Take 3-6 mg by mouth daily. Take 3 mg on tue and sat and 6 mg all other days   05/09/2014 at Unknown time    Assessment: 73 y/o M admitted 05/10/2014 with SOB, weakness, fatigue, no appetite x 5d. Pharmacy consulted to dose vancomycin, cefepime, heparin until INR >2, and warfarin.  AC: Chronic warfarin for severe coronary/vascular disease with h/o CVA. On admission, INR was SUPRAtherapeutic at 8.53 and patient received Vit K 5 mg IV x 1. INR today is SUBtherpaeutic at 1.78 but did not trend down as expected. May take some time for INR to respond to warfarin.   Heparin level remains undetectable at <0.1.  Confirmed heparin is infusing through PICC line at 1250 units/hr. Lab drew heparin level peripherally. Spoke with Tobi BastosAnna, RN and no problems infusing and no infusion interruptions.   H/H remains low but stable, plt are also low but stable with no reported s/sx bleeding.  Goal of Therapy:  Heparin level 0.3-0.7 units/ml Monitor platelets by anticoagulation protocol: Yes   Plan:  - Increase heparin drip to 1500 units/hr  - 8 hr heparin level - Warfarin 6 mg PO tonight - Daily heparin level, INR, and CBC - Monitor for s/sx of bleeding  Thank you for allowing pharmacy to be a part of this patients care team.  Loura BackJennifer Pittsburg, Pharm.D., BCPS Clinical Pharmacist Pager: [phone] 05/12/2014 1:32 PM

## 2014-05-12 NOTE — Plan of Care (Signed)
Problem: Phase I Progression Outcomes Goal: Code status addressed with pt/family Outcome: Progressing Per md note, code status has been addressed. Pt would like to discuss with his sons before making changes to current code status.

## 2014-05-12 NOTE — Progress Notes (Signed)
[facility] PROGRESS NOTE  Clinton [name] WUJ:[id number] DOB: 08-Feb-1941 DOA: 05/10/2014 PCP: Arlan OrganMANNING, JAMES S., MD  Assessment/Plan: 1. Sepsis -Present on admission, evidenced by respiratory of 28, white count of 14,800, lactate 3.04, source of infection likely to be community-acquired pneumonia. -Patient was started on empiric IV antibiotic therapy with cefepime and vancomycin. -CT scan on admission showing the presence of right lower lobe pneumonia, -Repeat chest x-ray on 05/11/2014 showing persistent small right pleural effusion with right lower lobe infiltrate/pneumonia -Continue supportive care  2.  Community-acquired pneumonia -Patient presenting with clinical signs and symptoms suggestive of pneumonia, started on broad-spectrum IV and microbial therapy with cefepime and vancomycin -Initial CT scan showing presence of right lower lobe infiltrate, making aspiration also a possibility. -Will consult speech pathology -Repeat chest x-ray on 05/11/2014 showing persistent small right pleural effusion with right lower lobe infiltrate/pneumonia -Blood cultures obtained on 05/10/2014 showing no growth to date  3.  Acute on chronic systolic congestive heart failure. -Issue with history of ischemic heart lab the having ejection fraction of 20-25% per transthoracic echocardiogram performed on 06/06/2013. -Having evidence of acute decompensated congestive heart failure with labs show an elevated BNP of 63,888. -Lab work followed, patient's creatinine trending up to 3.5. Cardiology stopping Lasix drip starting Lasix 60 mg IV twice a day  4.  Probable demand ischemia -Patient presenting septic, troponins peaking at 3.63, trended down -Patient was started on IV heparin, Presley denying chest pain. -Cardiology following  5.  Coagulopathy.  -Presented with supratherapeutic INR of 8.53, administered vitamin K now having INR of 2.01. -Pharmacy consulted for Coumadin management  6.   Acute on chronic renal failure. -Creatinine trending up to 3.4, Lasix drip discontinued -Likely secondary to decreased cardiac output from congestive heart failure  Code Status: full code Family Communication:  Disposition Plan: continue IV diuresis, broad-spectrum IV antibiotic therapy, close monitoring in the step down unit   Consultants:  cardiology   Antibiotics:  Vancomycin (started on 05/10/2014)  Cefepime (started on 05/10/2014)  HPI/Subjective: Patient is a pleasant 73 year old gentle man with extensive class medical history including coronary artery disease, status post artery bypass grafting, ischemic heart mop the, chronic systolic congestive heart failure having ejection fraction of 20-25% based on transthoracic echocardiogram that was performed on 06/06/2013, admitted to the medicine service on 05/10/2014 as he presented with complaints of shortness of breath, cough, having a steep functional decline in the past 24-48 hours. Initial workup revealed presence of right lower lobe pneumonia seen on CT scan. He was also found to be in acute on chronic renal failure, and had elevated troponin initially of 0.56. His BNP was also elevated at 63,888. Case was discussed with cardiology given his extensive cardiac history and presence of acute on chronic systolic congestive heart failure. He was admitted to the step down unit started on broad-spectrum IV antibiotic therapy with vancomycin and cefepime. Cardiology started IV diuresis. Overnight his troponins peaking at 3.63. He did not complain of chest pain.  Objective: Filed Vitals:   05/12/14 1151  BP: 94/64  Pulse:   Temp: 98 F (36.7 C)  Resp:     Intake/Output Summary (Last 24 hours) at 05/12/14 1539 Last data filed at 05/12/14 1538  Gross per 24 hour  Intake 1515.98 ml  Output   2245 ml  Net -729.02 ml   Filed Weights   05/11/14 0438 05/12/14 0430 05/12/14 0611  Weight: 69.5 kg (153 lb 3.5 oz) 68.3 kg (150 lb 9.2 oz)  67.9 kg (149  lb 11.1 oz)    Exam:   General:  Ill-appearing although seems better today, awake alert, following commands  Cardiovascular: has ongoing jugular venous distention, regular rate rhythm, did not appreciate lower extremity edema  Respiratory: bibasilar crackles, positive rhonchi, positive rales  Abdomen: soft nontender nondistended  Musculoskeletal: no extremity edema  Data Reviewed: Basic Metabolic Panel:  Recent Labs Lab 05/10/14 0935 05/10/14 1002 05/10/14 1910 05/11/14 0454 05/12/14 0335  NA 124* 127*  --  126* 126*  K 5.0 4.7  --  4.5 3.9  CL 88* 98  --  91* 89*  CO2 16*  --   --  13* 16*  GLUCOSE 122* 125*  --  91 91  BUN 73* 76*  --  83* 92*  CREATININE 2.86* 3.20* 3.03* 3.11* 3.40*  CALCIUM 9.1  --   --  8.6 7.9*   Liver Function Tests: No results for input(s): AST, ALT, ALKPHOS, BILITOT, PROT, ALBUMIN in the last 168 hours. No results for input(s): LIPASE, AMYLASE in the last 168 hours. No results for input(s): AMMONIA in the last 168 hours. CBC:  Recent Labs Lab 05/10/14 0935 05/10/14 1002 05/10/14 1910 05/11/14 0454 05/12/14 0335  WBC 14.8*  --  15.0* 16.6* 12.6*  HGB 11.9* 13.3 11.7* 11.8* 11.1*  HCT 35.3* 39.0 34.4* 34.9* 31.7*  MCV 85.1  --  84.9 85.1 83.2  PLT 159  --  146* 150 148*   Cardiac Enzymes:  Recent Labs Lab 05/10/14 1910 05/10/14 2310 05/11/14 0454  TROPONINI 2.15* 3.63* 2.96*   BNP (last 3 results)  Recent Labs  06/05/13 0838 07/29/13 0818 05/10/14 0935  PROBNP 17824.0* 3171.0* 63888.0*   CBG: No results for input(s): GLUCAP in the last 168 hours.  Recent Results (from the past 240 hour(s))  Urine culture     Status: None   Collection Time: 05/10/14 10:03 AM  Result Value Ref Range Status   Specimen Description URINE, CLEAN CATCH  Final   Special Requests NONE  Final   Culture  Setup Time   Final    05/10/2014 20:50 Performed at [facility]    Colony Count NO GROWTH [facility]formed at [facility]   Final   Culture NO GROWTH Performed at [facility]   Final   Report Status 05/11/2014 FINAL  Final  Culture, blood (routine x 2)     Status: None (Preliminary result)   Collection Time: 05/10/14  7:05 PM  Result Value Ref Range Status   Specimen Description BLOOD LEFT FOREARM  Final   Special Requests BOTTLES DRAWN AEROBIC ONLY 4CC  Final   Culture  Setup Time   Final    05/11/2014 01:38 Performed at [facility]    Culture   Final           BLOOD CULTURE RECEIVED NO GROWTH TO DATE CULTURE WILL BE HELD FOR 5 DAYS BEFORE ISSUING A FINAL NEGATIVE REPORT Performed at [facility]    Report Status PENDING  Incomplete  Culture, blood (routine x 2)     Status: None (Preliminary result)   Collection Time: 05/10/14  7:10 PM  Result Value Ref Range Status   Specimen Description BLOOD RIGHT HAND  Final   Special Requests BOTTLES DRAWN AEROBIC ONLY 5CC  Final   Culture  Setup Time   Final    05/11/2014 01:38 Performed at [facility]    Culture   Final  BLOOD CULTURE RECEIVED NO GROWTH TO DATE CULTURE WILL BE HELD FOR 5 DAYS BEFORE ISSUING A FINAL NEGATIVE REPORT Performed at [facility]    Report Status PENDING  Incomplete     Studies: X-ray Chest Pa And Lateral  05/11/2014   CLINICAL DATA:  Pneumonia  EXAM: CHEST  2 VIEW  COMPARISON:  05/10/2014  FINDINGS: Cardiomediastinal silhouette is stable. Persistent small right pleural effusion with right lower lobe infiltrate/pneumonia. Status post median sternotomy. Central mild bronchitic changes.  IMPRESSION: Persistent small right pleural effusion with right lower lobe infiltrate/ pneumonia. No convincing pulmonary edema. Central mild bronchitic changes.   Electronically Signed   By: Natasha Mead M.D.   On: 05/11/2014 11:31    Scheduled Meds: . ceFEPime (MAXIPIME) IV  1 g Intravenous Q24H  . furosemide  60 mg Intravenous Q12H  . levothyroxine  50 mcg Oral QAC breakfast  .  rosuvastatin  40 mg Oral Daily  . sodium chloride  10-40 mL Intracatheter Q12H  . sodium chloride  3 mL Intravenous Q12H  . vancomycin  750 mg Intravenous Q24H  . warfarin  6 mg Oral ONCE-1800  . Warfarin - Pharmacist Dosing Inpatient   Does not apply q1800   Continuous Infusions: . heparin 1,500 Units/hr (05/12/14 1411)    Principal Problem:   CAP (community acquired pneumonia) Active Problems:   Sepsis   Essential hypertension   CAD, ARTERY BYPASS GRAFT   Peripheral vascular disease   Acute on chronic systolic CHF (congestive heart failure), NYHA class 4   AKI (acute kidney injury)   PNA (pneumonia)   Demand ischemia    Time spent: 35 minutes    Jeralyn Bennett  [facility] Pager [phone]. If 7PM-7AM, please contact night-coverage at [email or web address], password [facility] 05/12/2014, 3:39 PM  LOS: 2 days

## 2014-05-12 NOTE — Progress Notes (Signed)
Patient Name: Clinton [name] Date of Encounter: 05/12/2014     Principal Problem:   CAP (community acquired pneumonia) Active Problems:   Essential hypertension   CAD, ARTERY BYPASS GRAFT   Peripheral vascular disease   Acute on chronic systolic CHF (congestive heart failure), NYHA class 4   AKI (acute kidney injury)   Sepsis   PNA (pneumonia)   Demand ischemia    SUBJECTIVE  The patient states that his breathing has improved.  He is not coughing up much sputum.  He is voiding in small amounts frequently.he is on IV Lasix drip at 8 mg per hour and has lost another pound since yesterday.  Rhythm is normal sinus rhythm.  He is not having any chest discomfort  CURRENT MEDS . ceFEPime (MAXIPIME) IV  1 g Intravenous Q24H  . levothyroxine  50 mcg Oral QAC breakfast  . rosuvastatin  40 mg Oral Daily  . sodium chloride  10-40 mL Intracatheter Q12H  . sodium chloride  3 mL Intravenous Q12H  . vancomycin  750 mg Intravenous Q24H  . Warfarin - Pharmacist Dosing Inpatient   Does not apply q1800    OBJECTIVE  Filed Vitals:   05/12/14 0500 05/12/14 0600 05/12/14 0611 05/12/14 0759  BP:  97/59  100/62  Pulse: 53 72    Temp:    98 F (36.7 C)  TempSrc:    Oral  Resp: 23 19    Height:      Weight:   149 lb 11.1 oz (67.9 kg)   SpO2: 95% 95%      Intake/Output Summary (Last 24 hours) at 05/12/14 0923 Last data filed at 05/12/14 0802  Gross per 24 hour  Intake 1147.18 ml  Output   1625 ml  Net -477.82 ml   Filed Weights   05/11/14 0438 05/12/14 0430 05/12/14 0611  Weight: 153 lb 3.5 oz (69.5 kg) 150 lb 9.2 oz (68.3 kg) 149 lb 11.1 oz (67.9 kg)    PHYSICAL EXAM  General: Pleasant, NAD. Neuro: Alert and oriented X 3. Moves all extremities spontaneously. Psych: Normal affect. HEENT:  Normal  Neck: Supple without bruits or JVD. Lungs:  Resp regular and unlabored, mild expiratory wheeze Heart: RRR no s3, s4,there is a grade 2/6 holosystolic apical murmur of mitral  regurgitation. Abdomen: Soft, non-tender, non-distended, BS + x 4.  Extremities: No clubbing, cyanosis or edema.   Accessory Clinical Findings  CBC  Recent Labs  05/11/14 0454 05/12/14 0335  WBC 16.6* 12.6*  HGB 11.8* 11.1*  HCT 34.9* 31.7*  MCV 85.1 83.2  PLT 150 148*   Basic Metabolic Panel  Recent Labs  05/11/14 0454 05/12/14 0335  NA 126* 126*  K 4.5 3.9  CL 91* 89*  CO2 13* 16*  GLUCOSE 91 91  BUN 83* 92*  CREATININE 3.11* 3.40*  CALCIUM 8.6 7.9*   Liver Function Tests No results for input(s): AST, ALT, ALKPHOS, BILITOT, PROT, ALBUMIN in the last 72 hours. No results for input(s): LIPASE, AMYLASE in the last 72 hours. Cardiac Enzymes  Recent Labs  05/10/14 1910 05/10/14 2310 05/11/14 0454  TROPONINI 2.15* 3.63* 2.96*   BNP Invalid input(s): POCBNP D-Dimer No results for input(s): DDIMER in the last 72 hours. Hemoglobin A1C No results for input(s): HGBA1C in the last 72 hours. Fasting Lipid Panel No results for input(s): CHOL, HDL, LDLCALC, TRIG, CHOLHDL, LDLDIRECT in the last 72 hours. Thyroid Function Tests No results for input(s): TSH, T4TOTAL, T3FREE, THYROIDAB in the last 72  hours.  Invalid input(s): FREET3  TELE  Normal sinus rhythm  ECG  pending   Radiology/Studies  Ct Abdomen Pelvis Wo Contrast  05/10/2014   CLINICAL DATA:  Right lower quadrant pain for 5 days  EXAM: CT ABDOMEN AND PELVIS WITHOUT CONTRAST  TECHNIQUE: Multidetector CT imaging of the abdomen and pelvis was performed following the standard protocol without IV contrast.  COMPARISON:  None.  FINDINGS: Lung bases show of the left lung base be within normal limits. Right lower lobe pneumonia with associated effusion is seen.  The liver, gallbladder, spleen, adrenal glands and pancreas are within normal limits. The kidneys are well visualized bilaterally and demonstrate renal cystic change. Some of these have attenuation greater than normal fluid and likely represent  hemorrhagic cysts. No renal calculi or obstructive changes are seen. Changes consistent with aortobifemoral bypass graft are noted. The appendix is within normal limits. No definitive inflammatory changes are seen.  Bladder is partially distended. Free fluid is noted within the pelvis although no cause towards factors are seen. Small fluid containing left inguinal hernia is noted. Bony structures are within normal limits.  IMPRESSION: Right lower lobe pneumonia with associated effusion.  Free fluid within the pelvis of uncertain etiology.  Chronic changes as described.   Electronically Signed   By: Alcide CleverMark  Lukens M.D.   On: 05/10/2014 13:35   X-ray Chest Pa And Lateral  05/11/2014   CLINICAL DATA:  Pneumonia  EXAM: CHEST  2 VIEW  COMPARISON:  05/10/2014  FINDINGS: Cardiomediastinal silhouette is stable. Persistent small right pleural effusion with right lower lobe infiltrate/pneumonia. Status post median sternotomy. Central mild bronchitic changes.  IMPRESSION: Persistent small right pleural effusion with right lower lobe infiltrate/ pneumonia. No convincing pulmonary edema. Central mild bronchitic changes.   Electronically Signed   By: Natasha MeadLiviu  Pop M.D.   On: 05/11/2014 11:31   Dg Chest Port 1 View  05/10/2014   CLINICAL DATA:  Weakness.  Shortness of breath for 3 days.  Fatigue.  EXAM: PORTABLE CHEST - 1 VIEW  COMPARISON:  Two-view chest 06/05/2013.  FINDINGS: The heart is mildly enlarged. And chronic interstitial changes are similar to the prior study. This likely reflects some element of edema superimposed on chronic change. There is persistent blunting of the right costophrenic angle suggesting a small effusion or chronic pleural thickening. No focal airspace consolidation is evident.  IMPRESSION: 1. Cardiomegaly with mild edema superimposed on chronic interstitial coarsening. This likely reflects mild congestive heart failure. 2. Suspect small right pleural effusion.   Electronically Signed   By: Gennette Pachris   Mattern M.D.   On: 05/10/2014 10:26    ASSESSMENT AND PLAN 1. Acute on chronic systolic congestive heart failure. Echocardiogram on 06/06/13 showed ejection fraction 20-25% with mild mitral regurgitation and moderate aortic stenosis. 2. Demand ischemia.  Troponins trending down. 3.  Pneumonia/sepsis on broad-spectrum antibiotics. 4.  Chronic warfarin anticoagulation for severe vascular disease 5. Acute on chronic kidney injury.BUN and creatinine are rising.  Will reduce Lasix. 6. Low blood pressure  Plan: Change Lasix to 60 mg IV twice a day.  Follow kidney function closely.  Signed, Cassell Clementhomas Fayette Gasner MD

## 2014-05-12 NOTE — Progress Notes (Signed)
ANTICOAGULATION CONSULT NOTE - Follow Up Consult  Pharmacy Consult for Heparin  Indication: Severe vascular disease  Allergies  Allergen Reactions  . Codeine Phosphate Nausea And Vomiting    Patient Measurements: Height: 5\' 11"  (180.3 cm) Weight: 153 lb 3.5 oz (69.5 kg) IBW/kg (Calculated) : 75.3  Vital Signs: Temp: 97.9 F (36.6 C) (11/15 2352) Temp Source: Axillary (11/15 2352) BP: 93/49 mmHg (11/16 0202) Pulse Rate: 77 (11/16 0202)  Labs:  Recent Labs  05/10/14 0935 05/10/14 1002 05/10/14 1910 05/10/14 2310 05/11/14 0454 05/11/14 1915 05/12/14 0335  HGB 11.9* 13.3 11.7*  --  11.8*  --  11.1*  HCT 35.3* 39.0 34.4*  --  34.9*  --  31.7*  PLT 159  --  146*  --  150  --  148*  LABPROT 71.0*  --   --   --  23.0*  --  20.9*  INR 8.53*  --   --   --  2.01*  --  1.78*  HEPARINUNFRC  --   --   --   --   --  <0.10* <0.10*  CREATININE 2.86* 3.20* 3.03*  --  3.11*  --   --   TROPONINI  --   --  2.15* 3.63* 2.96*  --   --     Estimated Creatinine Clearance: 20.5 mL/min (by C-G formula based on Cr of 3.11).  Assessment: Sub-therapeutic heparin level, INR is still <2, other labs as above, no issues per RN.   Goal of Therapy:  Heparin level 0.3-0.7 units/ml Monitor platelets by anticoagulation protocol: Yes   Plan:  -Increase heparin to 1250 units/hr -1230 HL -Daily CBC/HL -Monitor for bleeding  Abran DukeLedford, Amir Glaus 05/12/2014,4:25 AM

## 2014-05-12 NOTE — Progress Notes (Signed)
INITIAL NUTRITION ASSESSMENT  DOCUMENTATION CODES Per approved criteria  -Not Applicable   INTERVENTION: Strawberry Ensure Complete po BID, each supplement provides 350 kcal and 13 grams of protein RD to follow for nutrition care plan  NUTRITION DIAGNOSIS: Inadequate oral intake related to acute illness, decreased appetite as evidenced by son report  Goal: Pt to meet >/= 90% of their estimated nutrition needs   Monitor:  PO & supplemental intake, weight, labs, I/O's  Reason for Assessment: Malnutrition Screening Tool Report  73 y.o. male  Admitting Dx: CAP (community acquired pneumonia)  ASSESSMENT: 73 y.o. Male with PMH of CAD s/p CABG, stage III CKD, CVA and on chronic anticoagulation who presented to the ER with complaints of worsening cough and shortness of breath.   CT scan revealed right lower lobe pneumonia with associated effusion.    RD spoke with patient's son at bedside.  Son reports patient usually eats 1 good meal per day then grazes/snacks throughout the day.  Since hospitalization, however, appetite has been decreased.  Son also reports pt has lost ~ 50 lbs in the "past few years".  PO intake variable at 25-45% per flowsheet records.  Likes Ensure oral nutrition supplements.  RD to order during hospitalization.  No muscle or subcutaneous fat depletion noticed.  Height: Ht Readings from Last 1 Encounters:  05/10/14 5\' 11"  (1.803 m)    Weight: Wt Readings from Last 1 Encounters:  05/12/14 149 lb 11.1 oz (67.9 kg)    Ideal Body Weight: 172 lb  % Ideal Body Weight: 87%  Wt Readings from Last 10 Encounters:  05/12/14 149 lb 11.1 oz (67.9 kg)  10/31/13 155 lb 6.4 oz (70.489 kg)  07/29/13 168 lb (76.204 kg)  07/03/13 154 lb 1.9 oz (69.908 kg)  06/19/13 158 lb 1.9 oz (71.723 kg)  06/12/13 151 lb 9.2 oz (68.755 kg)  08/24/12 157 lb (71.215 kg)  06/22/12 160 lb (72.576 kg)  01/27/12 160 lb 3.2 oz (72.666 kg)  10/21/11  162 lb 3.2 oz (73.573 kg)    Usual Body Weight: 154 lb -- January 2015  % Usual Body Weight: 96%  BMI:  Body mass index is 20.89 kg/(m^2).  Estimated Nutritional Needs: Kcal: 1600-1800 Protein: 75-85 gm Fluid: 1.6-1.8 L  Skin: Intact  Diet Order: Diet Heart  EDUCATION NEEDS: -No education needs identified at this time   Intake/Output Summary (Last 24 hours) at 05/12/14 1516 Last data filed at 05/12/14 1122  Gross per 24 hour  Intake 1103.48 ml  Output   1570 ml  Net -466.52 ml    Labs:   Recent Labs Lab 05/10/14 0935 05/10/14 1002 05/10/14 1910 05/11/14 0454 05/12/14 0335  NA 124* 127*  --  126* 126*  K 5.0 4.7  --  4.5 3.9  CL 88* 98  --  91* 89*  CO2 16*  --   --  13* 16*  BUN 73* 76*  --  83* 92*  CREATININE 2.86* 3.20* 3.03* 3.11* 3.40*  CALCIUM 9.1  --   --  8.6 7.9*  GLUCOSE 122* 125*  --  91 91    Scheduled Meds: . ceFEPime (MAXIPIME) IV  1 g Intravenous Q24H  . furosemide  60 mg Intravenous Q12H  . levothyroxine  50 mcg Oral QAC breakfast  . rosuvastatin  40 mg Oral Daily  . sodium chloride  10-40 mL Intracatheter Q12H  . sodium chloride  3 mL Intravenous Q12H  . vancomycin  750 mg Intravenous Q24H  . warfarin  6 mg Oral ONCE-1800  . Warfarin - Pharmacist Dosing Inpatient   Does not apply q1800    Continuous Infusions: . heparin 1,500 Units/hr (05/12/14 1411)    Past Medical History  Diagnosis Date  . Peripheral arterial disease     a. Ao-bifem bypass w/ 16x8 hemashield dacron graft, R aortorenal bypass w/ 6mm dacron graft;  b. 09/2011 ABI: R 0/87, L 0.92.  . Carotid artery occlusion     a. 12/2002 R CEA;  b. 05/2007 L Carotid stenting;  c. 09/2008 repeat L carotid stenting 2/2 ISR;  d. 09/2011 Carotid U/S: RICA 40-59%, LICA 60-79%, >50 dist LCCA stenosis.  Marland Kitchen. CAD (coronary artery disease)     a. CABG 1985;  b. 05/2002 Rotablator/PTCA to ostial LCX and RI;  c. 12/2009 MV: inf and lat infarct w/ minimal mid lateral and basilar ant ischemia, EF  29%->Med Rx.  . Cardiomyopathy, ischemic     a. 06/2010 Cardiac MRI: EF 30%, anterolat, post, inf prior subendocardial infarcts;  b. 08/2012 Echo: EF 30-35%, mild LVH mult wma's, Gr 2 DD;  c. 05/2013 Echo: EF 20-25%, diff HK, mod AS, mild MR, mod dil LA, mild to mod TR, PASP 63mmHg.  . Stroke     remote  . Hypertension   . Hyperlipidemia   . Tobacco abuse   . Anticoagulated on Coumadin   . CKD (chronic kidney disease), stage III   . Chronic systolic CHF (congestive heart failure)     a. 05/2013 EF 20-25%, diff HK.  . Moderate aortic stenosis     a. 05/2013 Echo: at least moderate AS (Valve area 1.07 cm2 - VTI; 1.08cm2 - Vmax).  . Hypothyroidism     a. 05/2013 TSH 50.15 - synthroid 50 mcg daily started.    Past Surgical History  Procedure Laterality Date  . Carotid endarterectomy    . Coronary artery bypass graft    . Pr vein bypass graft,aorto-fem-pop    . Ureteral artery renal bypass      Maureen ChattersKatie Kobi Mario, RD, LDN Pager #: [phone] After-Hours Pager #: [phone]

## 2014-05-12 NOTE — Progress Notes (Signed)
EKG results relayed to Dr. Patty SermonsBrackbill  @ 11:12.

## 2014-05-13 DIAGNOSIS — R7401 Elevation of levels of liver transaminase levels: Secondary | ICD-10-CM | POA: Insufficient documentation

## 2014-05-13 DIAGNOSIS — A4189 Other specified sepsis: Secondary | ICD-10-CM | POA: Insufficient documentation

## 2014-05-13 DIAGNOSIS — R740 Nonspecific elevation of levels of transaminase and lactic acid dehydrogenase [LDH]: Secondary | ICD-10-CM

## 2014-05-13 LAB — BLOOD GAS, ARTERIAL
ACID-BASE DEFICIT: 5.3 mmol/L — AB (ref 0.0–2.0)
Bicarbonate: 18.7 mEq/L — ABNORMAL LOW (ref 20.0–24.0)
Drawn by: 277331
FIO2: 0.21 %
O2 SAT: 76.6 %
PCO2 ART: 31.8 mmHg — AB (ref 35.0–45.0)
PO2 ART: 44.2 mmHg — AB (ref 80.0–100.0)
Patient temperature: 98.7
TCO2: 19.7 mmol/L (ref 0–100)
pH, Arterial: 7.388 (ref 7.350–7.450)

## 2014-05-13 LAB — CBC WITH DIFFERENTIAL/PLATELET
BASOS PCT: 0 % (ref 0–1)
BASOS PCT: 0 % (ref 0–1)
Basophils Absolute: 0 10*3/uL (ref 0.0–0.1)
Basophils Absolute: 0 10*3/uL (ref 0.0–0.1)
EOS ABS: 0 10*3/uL (ref 0.0–0.7)
EOS ABS: 0.1 10*3/uL (ref 0.0–0.7)
Eosinophils Relative: 0 % (ref 0–5)
Eosinophils Relative: 0 % (ref 0–5)
HCT: 29.1 % — ABNORMAL LOW (ref 39.0–52.0)
HCT: 27.6 % — ABNORMAL LOW (ref 39.0–52.0)
HEMOGLOBIN: 10 g/dL — AB (ref 13.0–17.0)
Hemoglobin: 9.6 g/dL — ABNORMAL LOW (ref 13.0–17.0)
LYMPHS ABS: 0.2 10*3/uL — AB (ref 0.7–4.0)
Lymphocytes Relative: 1 % — ABNORMAL LOW (ref 12–46)
Lymphocytes Relative: 2 % — ABNORMAL LOW (ref 12–46)
Lymphs Abs: 0.2 10*3/uL — ABNORMAL LOW (ref 0.7–4.0)
MCH: 27.7 pg (ref 26.0–34.0)
MCH: 27.7 pg (ref 26.0–34.0)
MCHC: 34.4 g/dL (ref 30.0–36.0)
MCHC: 34.8 g/dL (ref 30.0–36.0)
MCV: 80.6 fL (ref 78.0–100.0)
MCV: 79.8 fL (ref 78.0–100.0)
MONOS PCT: 5 % (ref 3–12)
Monocytes Absolute: 0.6 10*3/uL (ref 0.1–1.0)
Monocytes Absolute: 0.5 10*3/uL (ref 0.1–1.0)
Monocytes Relative: 4 % (ref 3–12)
NEUTROS ABS: 12.5 10*3/uL — AB (ref 1.7–7.7)
Neutro Abs: 10.8 10*3/uL — ABNORMAL HIGH (ref 1.7–7.7)
Neutrophils Relative %: 94 % — ABNORMAL HIGH (ref 43–77)
Neutrophils Relative %: 94 % — ABNORMAL HIGH (ref 43–77)
PLATELETS: 156 10*3/uL (ref 150–400)
PLATELETS: 159 10*3/uL (ref 150–400)
RBC: 3.61 MIL/uL — AB (ref 4.22–5.81)
RBC: 3.46 MIL/uL — ABNORMAL LOW (ref 4.22–5.81)
RDW: 15 % (ref 11.5–15.5)
RDW: 15 % (ref 11.5–15.5)
WBC: 13.4 10*3/uL — ABNORMAL HIGH (ref 4.0–10.5)
WBC: 11.6 10*3/uL — AB (ref 4.0–10.5)

## 2014-05-13 LAB — COMPREHENSIVE METABOLIC PANEL
ALT: 148 U/L — ABNORMAL HIGH (ref 0–53)
ANION GAP: 20 — AB (ref 5–15)
AST: 145 U/L — ABNORMAL HIGH (ref 0–37)
Albumin: 2.5 g/dL — ABNORMAL LOW (ref 3.5–5.2)
Alkaline Phosphatase: 179 U/L — ABNORMAL HIGH (ref 39–117)
BUN: 93 mg/dL — AB (ref 6–23)
CALCIUM: 8 mg/dL — AB (ref 8.4–10.5)
CO2: 20 meq/L (ref 19–32)
CREATININE: 3.48 mg/dL — AB (ref 0.50–1.35)
Chloride: 86 mEq/L — ABNORMAL LOW (ref 96–112)
GFR, EST AFRICAN AMERICAN: 18 mL/min — AB (ref >90)
GFR, EST NON AFRICAN AMERICAN: 16 mL/min — AB (ref >90)
GLUCOSE: 102 mg/dL — AB (ref 70–99)
Potassium: 3.3 mEq/L — ABNORMAL LOW (ref 3.7–5.3)
Sodium: 126 mEq/L — ABNORMAL LOW (ref 137–147)
TOTAL PROTEIN: 6.2 g/dL (ref 6.0–8.3)
Total Bilirubin: 1.3 mg/dL — ABNORMAL HIGH (ref 0.3–1.2)

## 2014-05-13 LAB — PROTIME-INR
INR: 3.54 — ABNORMAL HIGH (ref 0.00–1.49)
INR: 4.26 — ABNORMAL HIGH (ref 0.00–1.49)
PROTHROMBIN TIME: 35.7 s — AB (ref 11.6–15.2)
Prothrombin Time: 41.3 seconds — ABNORMAL HIGH (ref 11.6–15.2)

## 2014-05-13 LAB — MAGNESIUM: MAGNESIUM: 2.3 mg/dL (ref 1.5–2.5)

## 2014-05-13 LAB — HEPARIN LEVEL (UNFRACTIONATED): Heparin Unfractionated: 0.32 IU/mL (ref 0.30–0.70)

## 2014-05-13 LAB — LACTIC ACID, PLASMA: Lactic Acid, Venous: 1.4 mmol/L (ref 0.5–2.2)

## 2014-05-13 MED ORDER — SODIUM CHLORIDE 0.9 % IV SOLN
INTRAVENOUS | Status: DC
  Administered 2014-05-13 – 2014-05-14 (×2): 1000 mL via INTRAVENOUS
  Administered 2014-05-16: 12:00:00 via INTRAVENOUS

## 2014-05-13 MED ORDER — CYCLOBENZAPRINE HCL 5 MG PO TABS
5.0000 mg | ORAL_TABLET | Freq: Once | ORAL | Status: AC
  Administered 2014-05-13: 5 mg via ORAL
  Filled 2014-05-13: qty 1

## 2014-05-13 MED ORDER — DM-GUAIFENESIN ER 30-600 MG PO TB12
1.0000 | ORAL_TABLET | Freq: Two times a day (BID) | ORAL | Status: DC
  Administered 2014-05-13 – 2014-05-26 (×26): 1 via ORAL
  Filled 2014-05-13 (×28): qty 1

## 2014-05-13 MED ORDER — METHYLPREDNISOLONE SODIUM SUCC 125 MG IJ SOLR
60.0000 mg | INTRAMUSCULAR | Status: DC
  Administered 2014-05-13: 60 mg via INTRAVENOUS
  Filled 2014-05-13 (×2): qty 0.96

## 2014-05-13 MED ORDER — SODIUM CHLORIDE 0.9 % IV BOLUS (SEPSIS)
500.0000 mL | Freq: Once | INTRAVENOUS | Status: AC
  Administered 2014-05-13: 500 mL via INTRAVENOUS

## 2014-05-13 NOTE — Progress Notes (Addendum)
Pt coughing frequently while present in room, with no sputum. While out of room patient family notified me that patient coughed up blood. Went to room, inside of cup on bedside table. Moderate amount of blood tinged sputum. Pt not complaining of pain. Will continue to monitor patient. Paged Dr. Carolyne [place]is [place], notified him of patient situation. Will continue to monitor and wait for response for MD.  New orders received.

## 2014-05-13 NOTE — Evaluation (Signed)
Physical Therapy Evaluation Patient Details Name: Clinton [name] MRN: [id number] DOB: [birth year] Today's Date: 05/13/2014   History of Present Illness  Patient with complex medical history presents with sepsis and work up revealing Community-acquired pneumonia.   Clinical Impression  Patient demonstrates deficits in functional mobility as indicated below. Will need continued skilled PT to address deficits and maximize function. Will see as indicated and progress as tolerated. Recommend HHPT and initial supervision upon acute discharge.    Follow Up Recommendations Home health PT;Supervision/Assistance - 24 hour    Equipment Recommendations  Rolling walker with 5" wheels (pending progress)    Recommendations for Other Services       Precautions / Restrictions Precautions Precautions: Fall Restrictions Weight Bearing Restrictions: No      Mobility  Bed Mobility               General bed mobility comments: received in chair  Transfers Overall transfer level: Needs assistance Equipment used: 1 person hand held assist Transfers: Sit to/from Stand Sit to Stand: Mod assist         General transfer comment: VCs for postioning at edge of chair, cues for hand placement  Ambulation/Gait Ambulation/Gait assistance: Mod assist;Max assist Ambulation Distance (Feet): 70 Feet Assistive device: 1 person hand held assist Gait Pattern/deviations: Step-through pattern;Decreased stride length;Shuffle;Drifts right/left;Narrow base of support Gait velocity: decreased Gait velocity interpretation: <1.8 ft/sec, indicative of risk for recurrent falls General Gait Details: increased instability noted, patient with poor balance secondary to weakness and LE pain  Stairs            Wheelchair Mobility    Modified Rankin (Stroke Patients Only)       Balance Overall balance assessment: Needs assistance Sitting-balance support: Feet supported Sitting balance-Leahy Scale:  Fair     Standing balance support: During functional activity Standing balance-Leahy Scale: Poor                               Pertinent Vitals/Pain Pain Assessment: Faces Faces Pain Scale: Hurts whole lot Pain Location: BLEs calves Pain Descriptors / Indicators: Cramping Pain Intervention(s): Repositioned;Monitored during session;Relaxation;Limited activity within patient's tolerance    Home Living Family/patient expects to be discharged to:: Private residence Living Arrangements: Children Available Help at Discharge: Family Type of Home: House Home Access: Stairs to enter Entrance Stairs-Rails: None Secretary/administratorntrance Stairs-Number of Steps: 3 Home Layout: One level Home Equipment: Cane - single point      Prior Function Level of Independence: Independent               Hand Dominance   Dominant Hand: Right    Extremity/Trunk Assessment   Upper Extremity Assessment: Generalized weakness           Lower Extremity Assessment: RLE deficits/detail;LLE deficits/detail      Cervical / Trunk Assessment: Kyphotic (fwd head posture)  Communication   Communication: HOH  Cognition Arousal/Alertness: Awake/alert Behavior During Therapy: Flat affect Overall Cognitive Status: Within Functional Limits for tasks assessed                      General Comments      Exercises        Assessment/Plan    PT Assessment Patient needs continued PT services  PT Diagnosis Difficulty walking;Abnormality of gait;Generalized weakness;Acute pain   PT Problem List Decreased strength;Decreased range of motion;Decreased activity tolerance;Decreased balance;Decreased mobility;Cardiopulmonary status limiting activity;Pain  PT Treatment Interventions  DME instruction;Gait training;Stair training;Functional mobility training;Therapeutic activities;Therapeutic exercise;Balance training;Patient/family education   PT Goals (Current goals can be found in the Care Plan  section) Acute Rehab PT Goals Patient Stated Goal: to feel better PT Goal Formulation: With patient Time For Goal Achievement: 05/27/14 Potential to Achieve Goals: Good    Frequency Min 3X/week   Barriers to discharge Decreased caregiver support son works at night    Co-evaluation               End of Session Equipment Utilized During Treatment: Gait belt Activity Tolerance: Patient limited by fatigue Patient left: in chair;with call bell/phone within reach;with family/visitor present Nurse Communication: Mobility status         Time: 0935-1001 PT Time Calculation (min) (ACUTE ONLY): 26 min   Charges:   PT Evaluation $Initial PT Evaluation Tier I: 1 Procedure PT Treatments $Gait Training: 8-22 mins $Therapeutic Activity: 8-22 mins   PT G CodesFabio Asa:          Clinton [name] J 05/13/2014, 11:04 AM Charlotte Crumbevon Gweneth Fredlund, PT DPT  [phone]

## 2014-05-13 NOTE — Progress Notes (Signed)
ANTICOAGULATION CONSULT NOTE - Follow Up Consult  Pharmacy Consult for heparin and warfarin Indication: Severe peripheral arterial disease  Allergies  Allergen Reactions  . Codeine Phosphate Nausea And Vomiting    Patient Measurements: Height: 5\' 11"  (180.3 cm) Weight: 149 lb 11.1 oz (67.9 kg) IBW/kg (Calculated) : 75.3  Vital Signs: Temp: 97.8 F (36.6 C) (11/17 0732) Temp Source: Oral (11/17 0732) BP: 104/49 mmHg (11/17 0735) Pulse Rate: 66 (11/17 0732)  Labs:  Recent Labs  05/10/14 1910 05/10/14 2310 05/11/14 0454  05/12/14 0335 05/12/14 1231 05/12/14 2248 05/13/14 0740  HGB 11.7*  --  11.8*  --  11.1*  --   --  10.0*  HCT 34.4*  --  34.9*  --  31.7*  --   --  29.1*  PLT 146*  --  150  --  148*  --   --  156  LABPROT  --   --  23.0*  --  20.9*  --   --  35.7*  INR  --   --  2.01*  --  1.78*  --   --  3.54*  HEPARINUNFRC  --   --   --   < > <0.10* <0.10* 0.11* 0.32  CREATININE 3.03*  --  3.11*  --  3.40*  --   --  3.48*  TROPONINI 2.15* 3.63* 2.96*  --   --   --   --   --   < > = values in this interval not displayed.  Estimated Creatinine Clearance: 17.9 mL/min (by C-G formula based on Cr of 3.48).   Medications:  Prescriptions prior to admission  Medication Sig Dispense Refill Last Dose  . carvedilol (COREG) 12.5 MG tablet Take 1.5 tablets (18.75 mg total) by mouth 2 (two) times daily. 90 tablet 12 05/10/2014 at 0830  . ezetimibe (ZETIA) 10 MG tablet Take 10 mg by mouth daily.     05/09/2014 at Unknown time  . furosemide (LASIX) 20 MG tablet Take 1 tablet (20 mg total) by mouth daily. 30 tablet 6 05/09/2014 at Unknown time  . hydrALAZINE (APRESOLINE) 50 MG tablet Take 1 tablet (50 mg total) by mouth 3 (three) times daily. 90 tablet 2 05/10/2014 at Unknown time  . isosorbide mononitrate (IMDUR) 30 MG 24 hr tablet Take 1 tablet (30 mg total) by mouth daily. 30 tablet 6 05/10/2014 at Unknown time  . levothyroxine (SYNTHROID, LEVOTHROID) 50 MCG tablet Take 1  tablet (50 mcg total) by mouth daily before breakfast. 30 tablet 3 05/10/2014 at Unknown time  . losartan (COZAAR) 100 MG tablet Take 100 mg by mouth daily.     05/10/2014 at Unknown time  . rosuvastatin (CRESTOR) 40 MG tablet Take 1 tablet (40 mg total) by mouth daily. 30 tablet 6 05/09/2014 at Unknown time  . warfarin (COUMADIN) 6 MG tablet Take 3-6 mg by mouth daily. Take 3 mg on tue and sat and 6 mg all other days   05/09/2014 at Unknown time    Assessment: 73 y/o M admitted 05/10/2014 with SOB, weakness, fatigue, no appetite x 5d. Pharmacy consulted to dose vancomycin, cefepime, heparin until INR >2, and warfarin.  AC: Chronic warfarin for severe coronary/vascular disease with h/o CVA. On admission, INR was SUPRAtherapeutic at 8.53 and patient received Vit K 5 mg IV x 1. INR jumped today and is SUPRAtherpaeutic at 3.54 possibly due to patient not eating as much. H/H remains low but stable, plt are also low but stable with no reported s/sx bleeding.  Goal of Therapy:  INR 2-3 Monitor platelets by anticoagulation protocol: Yes   Plan:  - D/c heparin drip for INR >2 - No warfarin tonight - Daily INR - Monitor for s/sx of bleeding  -Spoke with Dr. Joseph ArtWoods and ok to hold rosuvastatin for increased LFTs  Thank you for allowing pharmacy to be a part of this patients care team.  [facility] CtrJennifer Mandan, Pharm.D., BCPS Clinical Pharmacist Pager: [phone] 05/13/2014 10:08 AM

## 2014-05-13 NOTE — Progress Notes (Signed)
[facility]  [name] Enck UJW:[id number] DOB: March 11, 1941 DOA: 05/10/2014 PCP: Arlan OrganMANNING, JAMES S., MD   Brief narrative: 73 year old gentle man with extensive class medical history including coronary artery disease, status post artery bypass grafting, ischemic cardiomyopathy, chronic systolic congestive heart failure with an ejection fraction of 20-25% per echocardiogram from 06/06/2013. He was admitted to the medicine service on 05/10/2014 after presenting with complaints of shortness of breath, cough and reported steep functional decline for the previous24-48 hours.   Initial workup revealed presence of right lower lobe pneumonia seen on CT scan. He was also found to be in acute on chronic renal failure, and had elevated troponin initially of 0.56. His BNP was also elevated at 63,888. Case was discussed with cardiology given his extensive cardiac history and presence of acute on chronic systolic congestive heart failure. He was admitted to the step down unit started on broad-spectrum IV antibiotic therapy with vancomycin and cefepime. Cardiology started IV diuresis. His troponin peaked at 3.63. He did not complain of chest pain.  HPI/Subjective: Endorsing gen weakness and excessive thirst  Assessment/Plan: Sepsis -Symptoms at admission of tachypnea RR of 28, white count of 14,800, lactate 3.04 with source of infection likely to be community-acquired pneumonia. -cont cefepime and vancomycin. -Blood cultures obtained on 05/10/2014 showing no growth to date -CT abd/pelvis with incidental finding of RLL PNA -Repeat chest x-ray on 05/11/2014 showed persistent small right pleural effusion with right lower lobe infiltrate/pneumonia -Continue supportive care  Community-acquired pneumonia -confirmed with CXR and CT -cont anbx's -currently not hypoxic -flutter valve q 4hr while awake -Mucinex DM BID -Solu-Medrol 60 mg daily  Acute on chronic systolic  congestive heart failure. -known EF of 20-25% per transthoracic echocardiogram performed on 06/06/2013. -?  acute decompensated congestive heart failure with labs show an elevated BNP of [id number],888; likely acute decompensation due to preadmission hypoxia from PNA. -aggressively diuresed with Lasix gtt- SCr up to 3.5 11/16 so Cards transitioned to BID IV Lasix -CHF compensated: not hypoxic, no LE edema and progressive renal failure and orthostatic (see below) so Lasix dc'd -strict I&O -Daily weight  Demand ischemia -Patient presented with sepsis, troponins peaked at 3.63 with current trend down -did not have CP -Cardiology following  Coagulopathy/chronic anticoagulation for PVD  -Presented with supratherapeutic INR of 8.53, administered vitamin K and INR decreased to 1,7 but now back up to 3.54 -Pharmacy consulted for Coumadin management; will hold Coumadin for now and repeat INR and H/H  Hypotension with orthostasis Due to volume depletion-bolus fluids and follow VS-356 pm begin gentle IVFs at 50/hr  Acute on chronic renal failure/pre renal/volume depletion. -baseline renal function 35/1.4 -pt presented with ARF: 73/2.86 but felt to be due to decompensated CHF and resultant low renal perfusion  -11/16 Scr up to 3.4 so Lasix gtt stopped -11/17 BUN 93 and Scr 3.48 with noted orthostasis noting baseline BP 140/62 with supine BP 91/50 with standing BP 69/48; give 500 cc NS and repeat OVS  Transaminitis Likely due to shock liver syndrome in setting of hypotension and low cardiac output from severe chronic systolic heart failure-agree with pharmacist to hold statin until LFTs recover   DVT prophylaxis: Coumadin Code Status: Full Family Communication: No family at bedside Disposition Plan/Expected LOS: SDU   Consultants: Cardiology/Dr. Patty SermonsBrackbill  Procedures: None  Cultures: Blood cx x 2 neg Urine cx neg  Antibiotics: Cefepime 11/14 >> Vancomycin 11/14 >>  Objective: Blood  pressure 105/52, pulse 91, temperature 98.3  F (36.8 C), temperature source Oral, resp. rate 21, height 5\' 11"  (1.803 m), weight 149 lb 11.1 oz (67.9 kg), SpO2 93 %.  Intake/Output Summary (Last 24 hours) at 05/13/14 1307 Last data filed at 05/13/14 1229  Gross per 24 hour  Intake 1303.88 ml  Output   1625 ml  Net -321.12 ml     Exam: Gen: No acute respiratory distress Chest: Clear to auscultation bilaterally without wheezes, rhonchi or crackles, room air Cardiac: Regular rate and rhythm, S1-S2, no rubs murmurs or gallops, no peripheral edema, no JVD-BP soft and OVS were positive Abdomen: Soft nontender nondistended without obvious hepatosplenomegaly, no ascites Extremities: Symmetrical in appearance without cyanosis, clubbing or effusion  Scheduled Meds:  Scheduled Meds: . ceFEPime (MAXIPIME) IV  1 g Intravenous Q24H  . feeding supplement (ENSURE COMPLETE)  237 mL Oral BID BM  . levothyroxine  50 mcg Oral QAC breakfast  . sodium chloride  10-40 mL Intracatheter Q12H  . sodium chloride  3 mL Intravenous Q12H  . vancomycin  750 mg Intravenous Q24H  . Warfarin - Pharmacist Dosing Inpatient   Does not apply q1800   Continuous Infusions:   Data Reviewed: Basic Metabolic Panel:  Recent Labs Lab 05/10/14 0935 05/10/14 1002 05/10/14 1910 05/11/14 0454 05/12/14 0335 05/13/14 0740  NA 124* 127*  --  126* 126* 126*  K 5.0 4.7  --  4.5 3.9 3.3*  CL 88* 98  --  91* 89* 86*  CO2 16*  --   --  13* 16* 20  GLUCOSE 122* 125*  --  91 91 102*  BUN 73* 76*  --  83* 92* 93*  CREATININE 2.86* 3.20* 3.03* 3.11* 3.40* 3.48*  CALCIUM 9.1  --   --  8.6 7.9* 8.0*  MG  --   --   --   --   --  2.3   Liver Function Tests:  Recent Labs Lab 05/13/14 0740  AST 145*  ALT 148*  ALKPHOS 179*  BILITOT 1.3*  PROT 6.2  ALBUMIN 2.5*   No results for input(s): LIPASE, AMYLASE in the last 168 hours. No results for input(s): AMMONIA in the last 168 hours. CBC:  Recent Labs Lab  05/10/14 0935 05/10/14 1002 05/10/14 1910 05/11/14 0454 05/12/14 0335 05/13/14 0740  WBC 14.8*  --  15.0* 16.6* 12.6* 13.4*  NEUTROABS  --   --   --   --   --  12.5*  HGB 11.9* 13.3 11.7* 11.8* 11.1* 10.0*  HCT 35.3* 39.0 34.4* 34.9* 31.7* 29.1*  MCV 85.1  --  84.9 85.1 83.2 80.6  PLT 159  --  146* 150 148* 156   Cardiac Enzymes:  Recent Labs Lab 05/10/14 1910 05/10/14 2310 05/11/14 0454  TROPONINI 2.15* 3.63* 2.96*   BNP (last 3 results)  Recent Labs  06/05/13 0838 07/29/13 0818 05/10/14 0935  PROBNP 17824.0* 3171.0* 63888.0*   CBG: No results for input(s): GLUCAP in the last 168 hours.  Recent Results (from the past 240 hour(s))  Urine culture     Status: None   Collection Time: 05/10/14 10:03 AM  Result Value Ref Range Status   Specimen Description URINE, CLEAN CATCH  Final   Special Requests NONE  Final   Culture  Setup Time   Final    05/10/2014 20:50 [facility]formed at [facility]    Colony Count NO GROWTH [facility]formed at [facility]   Final   Culture NO GROWTH Performed at [facility]  Final   Report Status 05/11/2014 FINAL  Final  Culture, blood (routine x 2)     Status: None (Preliminary result)   Collection Time: 05/10/14  7:05 PM  Result Value Ref Range Status   Specimen Description BLOOD LEFT FOREARM  Final   Special Requests BOTTLES DRAWN AEROBIC ONLY 4CC  Final   Culture  Setup Time   Final    05/11/2014 01:38 Performed at [facility]    Culture   Final           BLOOD CULTURE RECEIVED NO GROWTH TO DATE CULTURE WILL BE HELD FOR 5 DAYS BEFORE ISSUING A FINAL NEGATIVE REPORT Performed at [facility]    Report Status PENDING  Incomplete  Culture, blood (routine x 2)     Status: None (Preliminary result)   Collection Time: 05/10/14  7:10 PM  Result Value Ref Range Status   Specimen Description BLOOD RIGHT HAND  Final   Special Requests BOTTLES DRAWN AEROBIC ONLY 5CC  Final   Culture  Setup Time    Final    05/11/2014 01:38 Performed at [facility]    Culture   Final           BLOOD CULTURE RECEIVED NO GROWTH TO DATE CULTURE WILL BE HELD FOR 5 DAYS BEFORE ISSUING A FINAL NEGATIVE REPORT Performed at [facility]    Report Status PENDING  Incomplete     Studies:  Recent x-ray studies have been reviewed in detail by the Attending Physician  Time spent :      Junious Silk, ANP [facility] Office  [phone] Pager [phone]   **If unable to reach the above provider after paging please contact the Flow Manager @ [phone]  On-Call/Text Page:      [email or web address]      password [email or web address]  If 7PM-7AM, please contact night-coverage [email or web address] Password [email or web address] 05/13/2014, 1:07 PM   LOS: 3 days  Examined patient and discussed assessment and plan with ANP Revonda Standard. Patient with multiple complex medical problems> 40 minutes spent in direct patient care

## 2014-05-13 NOTE — Plan of Care (Addendum)
Pt c/o legs cramping- labs pending. Old labs reviewed and baseline renal function is: 35/1.4. Note has been aggressively diuresed due to concerns has multifactorial respiratory failure but with concurrent rise in BUN and Scr with subsequent change from Lasix gtt to BID IV Lasix. Will dc Lasix for now and observe. BP quite soft and if pt able will ck orthostatics. If needs volume consider small boluses as opposed to continuous IVFs given low EF. This plan was d/w my attending. Baseline OP BP 140/62.   OVS; Supine 91/50, sitting 72/55-standing 69/48; will give 500 cc NS over 3 hours and repeat OVS.  Junious SilkAllison Tilley Faeth, ANP

## 2014-05-13 NOTE — Progress Notes (Signed)
Patient complaining of leg cramps to BLE. BLE warm, no redness noted. Pulses palpable. Notified Dr. Joseph ArtWoods of pt status. WIll continue to monitor. New orders received.

## 2014-05-13 NOTE — Progress Notes (Addendum)
Patient Name: Clinton [name] Date of Encounter: 05/13/2014     Principal Problem:   CAP (community acquired pneumonia) Active Problems:   Essential hypertension   CAD, ARTERY BYPASS GRAFT   Peripheral vascular disease   Acute on chronic systolic CHF (congestive heart failure), NYHA class 4   AKI (acute kidney injury)   Sepsis   PNA (pneumonia)   Demand ischemia    SUBJECTIVE Patient states that his breathing is better.  Complains of leg cramps. Appetite poor.   CURRENT MEDS . ceFEPime (MAXIPIME) IV  1 g Intravenous Q24H  . feeding supplement (ENSURE COMPLETE)  237 mL Oral BID BM  . levothyroxine  50 mcg Oral QAC breakfast  . sodium chloride  10-40 mL Intracatheter Q12H  . sodium chloride  3 mL Intravenous Q12H  . vancomycin  750 mg Intravenous Q24H  . Warfarin - Pharmacist Dosing Inpatient   Does not apply q1800    OBJECTIVE  Filed Vitals:   05/13/14 0600 05/13/14 0700 05/13/14 0732 05/13/14 0735  BP: 91/58   104/49  Pulse: 66 70 66   Temp:   97.8 F (36.6 C)   TempSrc:   Oral   Resp: 20 21 18    Height:      Weight:      SpO2: 92% 92% 97%     Intake/Output Summary (Last 24 hours) at 05/13/14 1110 Last data filed at 05/13/14 0734  Gross per 24 hour  Intake 1478.88 ml  Output   1800 ml  Net -321.12 ml   Filed Weights   05/11/14 0438 05/12/14 0430 05/12/14 0611  Weight: 153 lb 3.5 oz (69.5 kg) 150 lb 9.2 oz (68.3 kg) 149 lb 11.1 oz (67.9 kg)    PHYSICAL EXAM  General: Pleasant, NAD. Neuro: Alert and oriented X 3. Moves all extremities spontaneously. Psych: Normal affect. HEENT:  Normal  Neck: Prominent v wave Lungs:  Resp regular and unlabored, CTA. Heart: RRR no s3, s4.  Grade 2/6 apical systolic murmur. Abdomen: Soft, non-tender, non-distended, BS + x 4.  Extremities: No clubbing, cyanosis or edema. DP/PT/Radials 2+ and equal bilaterally.  Accessory Clinical Findings  CBC  Recent Labs  05/12/14 0335 05/13/14 0740  WBC 12.6* 13.4*    NEUTROABS  --  12.5*  HGB 11.1* 10.0*  HCT 31.7* 29.1*  MCV 83.2 80.6  PLT 148* 156   Basic Metabolic Panel  Recent Labs  05/12/14 0335 05/13/14 0740  NA 126* 126*  K 3.9 3.3*  CL 89* 86*  CO2 16* 20  GLUCOSE 91 102*  BUN 92* 93*  CREATININE 3.40* 3.48*  CALCIUM 7.9* 8.0*  MG  --  2.3   Liver Function Tests  Recent Labs  05/13/14 0740  AST 145*  ALT 148*  ALKPHOS 179*  BILITOT 1.3*  PROT 6.2  ALBUMIN 2.5*   No results for input(s): LIPASE, AMYLASE in the last 72 hours. Cardiac Enzymes  Recent Labs  05/10/14 1910 05/10/14 2310 05/11/14 0454  TROPONINI 2.15* 3.63* 2.96*   BNP Invalid input(s): POCBNP D-Dimer No results for input(s): DDIMER in the last 72 hours. Hemoglobin A1C No results for input(s): HGBA1C in the last 72 hours. Fasting Lipid Panel No results for input(s): CHOL, HDL, LDLCALC, TRIG, CHOLHDL, LDLDIRECT in the last 72 hours. Thyroid Function Tests No results for input(s): TSH, T4TOTAL, T3FREE, THYROIDAB in the last 72 hours.  Invalid input(s): FREET3  TELE  NSR with PACs  ECG    Radiology/Studies  Ct Abdomen Pelvis Wo  Contrast  05/10/2014   CLINICAL DATA:  Right lower quadrant pain for 5 days  EXAM: CT ABDOMEN AND PELVIS WITHOUT CONTRAST  TECHNIQUE: Multidetector CT imaging of the abdomen and pelvis was performed following the standard protocol without IV contrast.  COMPARISON:  None.  FINDINGS: Lung bases show of the left lung base be within normal limits. Right lower lobe pneumonia with associated effusion is seen.  The liver, gallbladder, spleen, adrenal glands and pancreas are within normal limits. The kidneys are well visualized bilaterally and demonstrate renal cystic change. Some of these have attenuation greater than normal fluid and likely represent hemorrhagic cysts. No renal calculi or obstructive changes are seen. Changes consistent with aortobifemoral bypass graft are noted. The appendix is within normal limits. No  definitive inflammatory changes are seen.  Bladder is partially distended. Free fluid is noted within the pelvis although no cause towards factors are seen. Small fluid containing left inguinal hernia is noted. Bony structures are within normal limits.  IMPRESSION: Right lower lobe pneumonia with associated effusion.  Free fluid within the pelvis of uncertain etiology.  Chronic changes as described.   Electronically Signed   By: Alcide CleverMark  Lukens M.D.   On: 05/10/2014 13:35   X-ray Chest Pa And Lateral  05/11/2014   CLINICAL DATA:  Pneumonia  EXAM: CHEST  2 VIEW  COMPARISON:  05/10/2014  FINDINGS: Cardiomediastinal silhouette is stable. Persistent small right pleural effusion with right lower lobe infiltrate/pneumonia. Status post median sternotomy. Central mild bronchitic changes.  IMPRESSION: Persistent small right pleural effusion with right lower lobe infiltrate/ pneumonia. No convincing pulmonary edema. Central mild bronchitic changes.   Electronically Signed   By: Natasha MeadLiviu  Pop M.D.   On: 05/11/2014 11:31   Dg Chest Port 1 View  05/10/2014   CLINICAL DATA:  Weakness.  Shortness of breath for 3 days.  Fatigue.  EXAM: PORTABLE CHEST - 1 VIEW  COMPARISON:  Two-view chest 06/05/2013.  FINDINGS: The heart is mildly enlarged. And chronic interstitial changes are similar to the prior study. This likely reflects some element of edema superimposed on chronic change. There is persistent blunting of the right costophrenic angle suggesting a small effusion or chronic pleural thickening. No focal airspace consolidation is evident.  IMPRESSION: 1. Cardiomegaly with mild edema superimposed on chronic interstitial coarsening. This likely reflects mild congestive heart failure. 2. Suspect small right pleural effusion.   Electronically Signed   By: Gennette Pachris  Mattern M.D.   On: 05/10/2014 10:26    ASSESSMENT AND PLAN  1. Acute on chronic systolic congestive heart failure. Echocardiogram on 06/06/13 showed ejection fraction  20-25% with mild mitral regurgitation and moderate aortic stenosis. 2. Demand ischemia. Troponins trending down. 3. Pneumonia/sepsis on broad-spectrum antibiotics. 4. Chronic warfarin anticoagulation for severe vascular disease 5. Acute on chronic kidney injury.BUN and creatinine are rising.  Symptomatic leg cramps. 6. Low blood pressure  Plan: Lasix has been stopped for now. Observe off lasix and restart later in lower dose.  Signed, Cassell Clementhomas Nayshawn Mesta MD

## 2014-05-14 LAB — CBC
HCT: 30.9 % — ABNORMAL LOW (ref 39.0–52.0)
Hemoglobin: 10.3 g/dL — ABNORMAL LOW (ref 13.0–17.0)
MCH: 27 pg (ref 26.0–34.0)
MCHC: 33.3 g/dL (ref 30.0–36.0)
MCV: 81.1 fL (ref 78.0–100.0)
Platelets: 171 10*3/uL (ref 150–400)
RBC: 3.81 MIL/uL — AB (ref 4.22–5.81)
RDW: 15.3 % (ref 11.5–15.5)
WBC: 11.6 10*3/uL — AB (ref 4.0–10.5)

## 2014-05-14 LAB — BASIC METABOLIC PANEL
Anion gap: 21 — ABNORMAL HIGH (ref 5–15)
BUN: 95 mg/dL — ABNORMAL HIGH (ref 6–23)
CHLORIDE: 88 meq/L — AB (ref 96–112)
CO2: 17 mEq/L — ABNORMAL LOW (ref 19–32)
Calcium: 8.3 mg/dL — ABNORMAL LOW (ref 8.4–10.5)
Creatinine, Ser: 3.83 mg/dL — ABNORMAL HIGH (ref 0.50–1.35)
GFR calc Af Amer: 16 mL/min — ABNORMAL LOW (ref >90)
GFR calc non Af Amer: 14 mL/min — ABNORMAL LOW (ref >90)
GLUCOSE: 110 mg/dL — AB (ref 70–99)
POTASSIUM: 4 meq/L (ref 3.7–5.3)
SODIUM: 126 meq/L — AB (ref 137–147)

## 2014-05-14 LAB — VANCOMYCIN, TROUGH: VANCOMYCIN TR: 18 ug/mL (ref 10.0–20.0)

## 2014-05-14 LAB — MAGNESIUM: MAGNESIUM: 2.5 mg/dL (ref 1.5–2.5)

## 2014-05-14 LAB — PROTIME-INR
INR: 4.6 — ABNORMAL HIGH (ref 0.00–1.49)
PROTHROMBIN TIME: 43.8 s — AB (ref 11.6–15.2)

## 2014-05-14 LAB — ALBUMIN: ALBUMIN: 2.6 g/dL — AB (ref 3.5–5.2)

## 2014-05-14 MED ORDER — BISACODYL 10 MG RE SUPP
10.0000 mg | Freq: Every day | RECTAL | Status: DC | PRN
  Administered 2014-05-15: 10 mg via RECTAL
  Filled 2014-05-14: qty 1

## 2014-05-14 MED ORDER — LEVOFLOXACIN IN D5W 500 MG/100ML IV SOLN
500.0000 mg | INTRAVENOUS | Status: DC
Start: 2014-05-16 — End: 2014-05-19
  Administered 2014-05-16 – 2014-05-18 (×2): 500 mg via INTRAVENOUS
  Filled 2014-05-14 (×3): qty 100

## 2014-05-14 MED ORDER — VANCOMYCIN HCL IN DEXTROSE 1-5 GM/200ML-% IV SOLN
1000.0000 mg | INTRAVENOUS | Status: DC
Start: 2014-05-15 — End: 2014-05-14

## 2014-05-14 MED ORDER — LEVOFLOXACIN IN D5W 750 MG/150ML IV SOLN
750.0000 mg | Freq: Once | INTRAVENOUS | Status: AC
  Administered 2014-05-14: 750 mg via INTRAVENOUS
  Filled 2014-05-14: qty 150

## 2014-05-14 MED ORDER — POLYETHYLENE GLYCOL 3350 17 G PO PACK
17.0000 g | PACK | Freq: Every day | ORAL | Status: DC | PRN
  Administered 2014-05-14: 17 g via ORAL
  Filled 2014-05-14 (×2): qty 1

## 2014-05-14 MED ORDER — FUROSEMIDE 20 MG PO TABS
20.0000 mg | ORAL_TABLET | Freq: Every day | ORAL | Status: DC
  Administered 2014-05-14: 20 mg via ORAL
  Filled 2014-05-14: qty 1

## 2014-05-14 MED ORDER — ZOLPIDEM TARTRATE 5 MG PO TABS
5.0000 mg | ORAL_TABLET | Freq: Once | ORAL | Status: AC
  Administered 2014-05-14: 5 mg via ORAL
  Filled 2014-05-14: qty 1

## 2014-05-14 NOTE — Progress Notes (Signed)
ANTICOAGULATION & ANTIBIOTIC CONSULT NOTE - Follow Up Consult  Pharmacy Consult for Warfarin & Vancomycin + Cefepime Indication: Hx CVA/severe PAD + r/o CAP/asp PNA  Allergies  Allergen Reactions  . Codeine Phosphate Nausea And Vomiting    Patient Measurements: Height: 5\' 11"  (180.3 cm) Weight: 149 lb 11.1 oz (67.9 kg) IBW/kg (Calculated) : 75.3  Vital Signs: Temp: 97.2 F (36.2 C) (11/18 0850) Temp Source: Oral (11/18 0850) BP: 113/65 mmHg (11/18 0850) Pulse Rate: 48 (11/18 0850)  Labs:  Recent Labs  05/12/14 0335 05/12/14 1231 05/12/14 2248 05/13/14 0740 05/13/14 1920 05/14/14 0356  HGB 11.1*  --   --  10.0* 9.6* 10.3*  HCT 31.7*  --   --  29.1* 27.6* 30.9*  PLT 148*  --   --  156 159 171  LABPROT 20.9*  --   --  35.7* 41.3* 43.8*  INR 1.78*  --   --  3.54* 4.26* 4.60*  HEPARINUNFRC <0.10* <0.10* 0.11* 0.32  --   --   CREATININE 3.40*  --   --  3.48*  --  3.83*    Estimated Creatinine Clearance: 16.3 mL/min (by C-G formula based on Cr of 3.83).   Assessment: [age] YOM admitted on 11/14 with a SUPRAtherapeutic INR of 8.53 on PTA dose of 6 mg daily EXCEPT for 3 mg on Tues/Sat. The INR was reversed and resumed on 11/15 with a heparin bridge for hx CVA/severe PAD. Heparin bridge was stopped on 11/17 due to an elevated INR. The INR today remains  SUPRAtherapeutic despite holding yesterday (INR 4.6 << 3.54, goal of 2-3). LFTs taken on 11/17 were noted to be elevated - which likely contributed to the patient's increased sensitivity to warfarin. Will hold warfarin again today.  The patient also continues on Vancomcycin + Cefepime for r/o CAP/aspiration PNA. The patient's renal function has been worsening, SCr 3.83 << 3.48, CrCl~10-20 ml/min. A random vancomycin level today (not at steady state) resulted as 18 mcg/ml which indicates that the patient is clearing the drug appropriately. Will go ahead and adjust the dose to a 48 hour interval today and continue to monitor trends in  renal function.   Goal of Therapy:  INR 2-3 Vancomycin trough of 15-20 mcg/ml   Plan:  1. Hold warfarin today 2. Vancomycin 750 mg as scheduled at 1100 today, then transition the patient to 1g IV every 48 hours - starting on 11/19 evening 3. Continue Cefepime 1g IV every 24 hours 4. Will continue to monitor for any signs/symptoms of bleeding and will follow up with PT/INR in the a.m. 5. Will continue to follow renal function, culture results, LOT, and antibiotic de-escalation plans   Georgina PillionElizabeth Doylene Splinter, PharmD, BCPS Clinical Pharmacist Pager: [phone] 05/14/2014 11:07 AM

## 2014-05-14 NOTE — Clinical Social Work Psychosocial (Signed)
Clinical Social Work Department BRIEF PSYCHOSOCIAL ASSESSMENT 05/14/2014  Patient:  Clinton [name],Clinton [name]     Account Number:  [id number]     Admit date:  05/10/2014  Clinical Social Worker:  Merlyn LotHOLOMAN,Ghali Morissette, CLINICAL SOCIAL WORKER  Date/Time:  05/14/2014 02:37 PM  Referred by:  Physician  Date Referred:  05/14/2014 Referred for  SNF Placement   Other Referral:   Interview type:  Patient Other interview type:    PSYCHOSOCIAL DATA Living Status:  FAMILY Admitted from facility:   Level of care:   Primary support name:  Clinton ElandRicky [name] Primary support relationship to patient:  CHILD, ADULT Degree of support available:   Patient reports high level of support from son who he is currently living with.    CURRENT CONCERNS Current Concerns  Post-Acute Placement   Other Concerns:    SOCIAL WORK ASSESSMENT / PLAN CSW spoke with patient about PTs recommendation for SNF. Patient is agreeable to bed search and would like his son, Clinton [name], to be involved in decision making.  CSW will continue to follow.   Assessment/plan status:  Psychosocial Support/Ongoing Assessment of Needs Other assessment/ plan:   FL2  PASAR   Information/referral to community resources:   [place] [facility] [place] [facility]    PATIENT'S/FAMILY'S RESPONSE TO PLAN OF CARE: Patient is agreeable to plan for SNF on a short term basis but hopes to be better enough to go home soon.       Merlyn LotJenna Holoman, LCSWA Clinical Social Worker [phone]

## 2014-05-14 NOTE — Progress Notes (Signed)
Patient Name: Clinton [name] Date of Encounter: 05/14/2014     Principal Problem:   CAP (community acquired pneumonia) Active Problems:   Essential hypertension   CAD, ARTERY BYPASS GRAFT   Peripheral vascular disease   Acute on chronic systolic CHF (congestive heart failure), NYHA class 4   AKI (acute kidney injury)   Sepsis   PNA (pneumonia)   Demand ischemia   Sepsis due to other etiology   Transaminitis    SUBJECTIVE Feeling better. Breathing back to baseline. No complaints this AM. Still with productive cough with white sputum.   CURRENT MEDS . ceFEPime (MAXIPIME) IV  1 g Intravenous Q24H  . dextromethorphan-guaiFENesin  1 tablet Oral BID  . feeding supplement (ENSURE COMPLETE)  237 mL Oral BID BM  . levothyroxine  50 mcg Oral QAC breakfast  . methylPREDNISolone (SOLU-MEDROL) injection  60 mg Intravenous Q24H  . sodium chloride  10-40 mL Intracatheter Q12H  . vancomycin  750 mg Intravenous Q24H  . Warfarin - Pharmacist Dosing Inpatient   Does not apply q1800    OBJECTIVE  Filed Vitals:   05/14/14 0100 05/14/14 0400 05/14/14 0700 05/14/14 0850  BP: 99/59 89/55 103/83 113/65  Pulse: 67 129 79 48  Temp:  97.3 F (36.3 C)  97.2 F (36.2 C)  TempSrc:  Oral  Oral  Resp: 21 18 13 15   Height:      Weight:      SpO2: 93% 94% 94% 96%    Intake/Output Summary (Last 24 hours) at 05/14/14 0957 Last data filed at 05/14/14 0700  Gross per 24 hour  Intake 976.17 ml  Output    350 ml  Net 626.17 ml   Filed Weights   05/11/14 0438 05/12/14 0430 05/12/14 0611  Weight: 153 lb 3.5 oz (69.5 kg) 150 lb 9.2 oz (68.3 kg) 149 lb 11.1 oz (67.9 kg)    PHYSICAL EXAM  General: Pleasant, NAD. Neuro: Alert and oriented X 3. Moves all extremities spontaneously. Psych: Normal affect. HEENT:  Normal  Neck: Prominent v wave Lungs:  Resp regular and unlabored, CTA. Heart: RRR no s3, s4.  Grade 2/6 apical systolic murmur. Abdomen: Soft, non-tender, non-distended, BS + x  4.  Extremities: No clubbing, cyanosis or edema. DP/PT/Radials 2+ and equal bilaterally.  Accessory Clinical Findings  CBC  Recent Labs  05/13/14 0740 05/13/14 1920 05/14/14 0356  WBC 13.4* 11.6* 11.6*  NEUTROABS 12.5* 10.8*  --   HGB 10.0* 9.6* 10.3*  HCT 29.1* 27.6* 30.9*  MCV 80.6 79.8 81.1  PLT 156 159 171   Basic Metabolic Panel  Recent Labs  05/13/14 0740 05/14/14 0356  NA 126* 126*  K 3.3* 4.0  CL 86* 88*  CO2 20 17*  GLUCOSE 102* 110*  BUN 93* 95*  CREATININE 3.48* 3.83*  CALCIUM 8.0* 8.3*  MG 2.3 2.5   Liver Function Tests  Recent Labs  05/13/14 0740 05/14/14 0356  AST 145*  --   ALT 148*  --   ALKPHOS 179*  --   BILITOT 1.3*  --   PROT 6.2  --   ALBUMIN 2.5* 2.6*    TELE  NSR with PACs, PVCS some brady   ECG    Radiology/Studies  Ct Abdomen Pelvis Wo Contrast  05/10/2014   CLINICAL DATA:  Right lower quadrant pain for 5 days  EXAM: CT ABDOMEN AND PELVIS WITHOUT CONTRAST  TECHNIQUE: Multidetector CT imaging of the abdomen and pelvis was performed following the standard protocol without IV contrast.  COMPARISON:  None.  FINDINGS: Lung bases show of the left lung base be within normal limits. Right lower lobe pneumonia with associated effusion is seen.  The liver, gallbladder, spleen, adrenal glands and pancreas are within normal limits. The kidneys are well visualized bilaterally and demonstrate renal cystic change. Some of these have attenuation greater than normal fluid and likely represent hemorrhagic cysts. No renal calculi or obstructive changes are seen. Changes consistent with aortobifemoral bypass graft are noted. The appendix is within normal limits. No definitive inflammatory changes are seen.  Bladder is partially distended. Free fluid is noted within the pelvis although no cause towards factors are seen. Small fluid containing left inguinal hernia is noted. Bony structures are within normal limits.  IMPRESSION: Right lower lobe  pneumonia with associated effusion.  Free fluid within the pelvis of uncertain etiology.  Chronic changes as described.   Electronically Signed   By: Alcide CleverMark  Lukens M.D.   On: 05/10/2014 13:35   X-ray Chest Pa And Lateral  05/11/2014   CLINICAL DATA:  Pneumonia  EXAM: CHEST  2 VIEW  COMPARISON:  05/10/2014  FINDINGS: Cardiomediastinal silhouette is stable. Persistent small right pleural effusion with right lower lobe infiltrate/pneumonia. Status post median sternotomy. Central mild bronchitic changes.  IMPRESSION: Persistent small right pleural effusion with right lower lobe infiltrate/ pneumonia. No convincing pulmonary edema. Central mild bronchitic changes.   Electronically Signed   By: Natasha MeadLiviu  Pop M.D.   On: 05/11/2014 11:31   Dg Chest Port 1 View  05/10/2014   CLINICAL DATA:  Weakness.  Shortness of breath for 3 days.  Fatigue.  EXAM: PORTABLE CHEST - 1 VIEW  COMPARISON:  Two-view chest 06/05/2013.  FINDINGS: The heart is mildly enlarged. And chronic interstitial changes are similar to the prior study. This likely reflects some element of edema superimposed on chronic change. There is persistent blunting of the right costophrenic angle suggesting a small effusion or chronic pleural thickening. No focal airspace consolidation is evident.  IMPRESSION: 1. Cardiomegaly with mild edema superimposed on chronic interstitial coarsening. This likely reflects mild congestive heart failure. 2. Suspect small right pleural effusion.   Electronically Signed   By: Gennette Pachris  Mattern M.D.   On: 05/10/2014 10:26    ASSESSMENT AND PLAN  Clinton [name] is a 73 y.o. male with a history of CAD s/p CABG, PVD, CVD, HTN, HLD, ICM ( EF 20-25%) who [facility] with on 05/10/14 sepsis/PNA as well as acute on chronic systolic CHF and demand ischemia.   1. Acute on chronic systolic congestive heart failure/ICM. Echocardiogram on 06/06/13 showed ejection fraction 20-25% with mild mitral regurgitation and moderate aortic  stenosis. -- Lasix stopped yesterday. He complains of no SOB. Will restart his home dose of oral Lasix 20mg  po qd today. WIll defer to MD if he needs higher dose. -- Declined ICD in the past -- Has had some bradycardia. Hold BB for now. No ACE/ARB due to AKI  2. Demand ischemia. Troponins trending down. Peal 3.63--> 2.96  3. Pneumonia/sepsis on broad-spectrum antibiotics.  4. Chronic warfarin anticoagulation for severe vascular disease -- INR supratheraptuic at 4.6  5. Acute on chronic kidney injury. BUN and creatinine are rising.  6. Low blood pressure- not on any antihypertensives. This is improving.   Signed, Janetta HoraHOMPSON, KATHRYN R MD Agree with above. Rhythm is sinus bradycardia with LBBB. Not on beta blocker because of bradycardia and low BP. Will stay with low dose lasix 20 mg daily rather than a higher dose at  this time. Limited by low BP.

## 2014-05-14 NOTE — Progress Notes (Signed)
Physical Therapy Treatment Patient Details Name: Clinton HeadsCharles W [name] MRN: [id number] DOB: [birth year] Today's Date: 05/14/2014    History of Present Illness Patient with complex medical history presents with sepsis and work up revealing Community-acquired pneumonia.     PT Comments    Patient more unsteady on his feet this session, concerned for increased deconditioning with decreased mobility.  Patient had lost his balance several times requiring maximal assist to prevent fall.  Additionally, spoke with patient's son at length regarding discharge, son just found that he will be needing surgery himself and therefore, father needs to be at his baseline of independence with mobility before returning home. Given decreased assist available and patients poor mobility and high fall risk, feel that patient will need ST SNF upon acute discharge, patient and son in agreement. Will update discharge recommendation accordingly.   Follow Up Recommendations  SNF;Supervision/Assistance - 24 hour     Equipment Recommendations  Rolling walker with 5" wheels (pending progress)    Recommendations for Other Services       Precautions / Restrictions Precautions Precautions: Fall Restrictions Weight Bearing Restrictions: No    Mobility  Bed Mobility               General bed mobility comments: received in chair  Transfers Overall transfer level: Needs assistance Equipment used: Rolling walker (2 wheeled);1 person hand held assist Transfers: Sit to/from Stand Sit to Stand: Mod assist         General transfer comment: VCs for hand placement, assist for safety and stability  Ambulation/Gait Ambulation/Gait assistance: Mod assist;Max assist Ambulation Distance (Feet): 140 Feet Assistive device: Rolling walker (2 wheeled) Gait Pattern/deviations: Step-through pattern;Decreased stride length;Shuffle;Drifts right/left;Narrow base of support Gait velocity: decreased Gait velocity  interpretation: <1.8 ft/sec, indicative of risk for recurrent falls General Gait Details: Significantly unsteady this session, 6 noted complete LOB require max assist to prevent fall, Patient with poor cordination of gait and overall decontitioned.   Stairs            Wheelchair Mobility    Modified Rankin (Stroke Patients Only)       Balance   Sitting-balance support: Feet supported Sitting balance-Leahy Scale: Fair     Standing balance support: Bilateral upper extremity supported;During functional activity Standing balance-Leahy Scale: Poor (2 noted LOB despite Bilateral UE support in static standing)               High level balance activites: Direction changes;Turns High Level Balance Comments: mod to max assist for stability, significant fall risk    Cognition Arousal/Alertness: Awake/alert Behavior During Therapy: Flat affect Overall Cognitive Status: Impaired/Different from baseline Area of Impairment: Following commands;Problem solving       Following Commands: Follows one step commands inconsistently;Follows one step commands with increased time     Problem Solving: Slow processing;Decreased initiation;Difficulty sequencing;Requires verbal cues;Requires tactile cues      Exercises      General Comments        Pertinent Vitals/Pain Pain Assessment: No/denies pain    Home Living                      Prior Function            PT Goals (current goals can now be found in the care plan section) Acute Rehab PT Goals Patient Stated Goal: to feel better PT Goal Formulation: With patient Time For Goal Achievement: 05/27/14 Potential to Achieve Goals: Good Progress towards PT goals: Progressing toward  goals (modestly, increased distance BUT more assist required)    Frequency  Min 3X/week    PT Plan Discharge plan needs to be updated    Co-evaluation             End of Session Equipment Utilized During Treatment: Gait  belt Activity Tolerance: Patient limited by fatigue Patient left: in chair;with call bell/phone within reach;with family/visitor present     Time: 0927-0953 PT Time Calculation (min) (ACUTE ONLY): 26 min  Charges:  $Gait Training: 8-22 mins $Therapeutic Activity: 8-22 mins                    G CodesFabio Asa:      Muad Noga J 05/14/2014, 10:37 AM Charlotte Crumbevon Adair Lauderback, PT DPT  [phone]

## 2014-05-14 NOTE — Progress Notes (Signed)
Clinton [name] 1 - Stepdown / ICU Progress Note  [name] Hergert EXB:[id number] DOB: 04/19/1941 DOA: 05/10/2014 PCP: Arlan OrganMANNING, JAMES S., MD  Brief narrative: 73 year old gentleman with extensive medical history including coronary artery disease status post CABG, ischemic cardiomyopathy, and chronic systolic congestive heart failure with an ejection fraction of 20-25% per echocardiogram from 06/06/2013 who was admitted to the Medicine Service on 05/10/2014 after presenting with complaints of shortness of breath, cough and reported steep functional decline for the previous 24-48 hours.   Initial workup revealed presence of right lower lobe pneumonia seen on CT scan. He was also found to be in acute on chronic renal failure, and had elevated troponin initially of 0.56. His BNP was also elevated at 63,888. Case was discussed with cardiology given his extensive cardiac history and presence of acute on chronic systolic congestive heart failure. He was admitted to the step down unit and started on broad-spectrum IV antibiotic therapy with vancomycin and cefepime. Cardiology started IV diuresis. His troponin peaked at 3.63. He did not complain of chest pain.  HPI/Subjective: Endorses feels stronger today - PT reports pt very unsteady on his feet - orthostatic were not checked  Assessment/Plan:  Sepsis w/ RLL CAP -Symptoms at admission of RR 28, white count of 14,800, lactate 3.04 with source of infection community-acquired pneumonia -cont cefepime and vancomycin  -Blood cultures obtained on 05/10/2014: no growth to date -CT abd/pelvis with incidental finding of RLL PNA -Repeat chest x-ray on 05/11/2014 showed persistent small right pleural effusion with right lower lobe infiltrate/pneumonia -Continue supportive care  Acute on chronic systolic congestive heart failure. -known EF of 20-25% per transthoracic echocardiogram performed on 06/06/2013. -aggressively diuresed with Lasix gtt - SCr up to  3.5 11/16 so Cards transitioned to BID IV Lasix -CHF remains compensated despite IVFs: not hypoxic, no LE edema and progressive renal failure and orthostatic (see below) so Lasix dc'd 11/17  Demand ischemia -Patient presented with sepsis, troponins peaked at 3.63 with current trend down -did not have CP -Cardiology following  Coagulopathy/chronic anticoagulation for PVD  -Presented with supratherapeutic INR of 8.53, administered vitamin K and INR decreased to 1.7 and continues to increase - no signs of bleeding/hgb stable -Pharmacy managing Coumadin -Coumadin on hold  Hypotension with orthostasis -Due to volume depletion - bolus fluids and follow VS -repeat orthostatic VS  Acute on chronic renal failure/pre renal/volume depletion. -baseline renal function 35/1.4 -pt presented with ARF: 74/2.86 but felt to be due to decompensated CHF and resultant low renal perfusion  -11/16 Scr up to 3.4 so Lasix gtt stopped -11/17 BUN 93 and Scr 3.48 with noted orthostasis  Transaminitis -Likely due to shock liver syndrome in setting of hypotension and low cardiac output from severe chronic systolic heart failure -hold statin until LFTs recover -ck CMET in am   Physical deconditioning PT rec SNF at dc  DVT prophylaxis: Coumadin Code Status: Full Family Communication: Family at bedside and updated Disposition Plan/Expected LOS: SDU  Consultants: Cardiology/Dr. Patty SermonsBrackbill  Procedures: None  Antibiotics: Cefepime 11/14 > Vancomycin 11/14 > 11/18  Objective: Blood pressure 90/48, pulse 56, temperature 97.3 F (36.3 C), temperature source Oral, resp. rate 18, height 5\' 11"  (1.803 m), weight 67.9 kg (149 lb 11.1 oz), SpO2 96 %.  Intake/Output Summary (Last 24 hours) at 05/14/14 1838 Last data filed at 05/14/14 1830  Gross per 24 hour  Intake 704.17 ml  Output    450 ml  Net 254.17 ml   Exam: Gen: No acute respiratory  distress at rest  Chest: poor air movment in R base - no  wheezes  Cardiac: Regular rate and rhythm, S1-S2, no rubs murmurs or gallops, no peripheral edema, no JVD Abdomen: Soft nontender nondistended without obvious hepatosplenomegaly, no ascites Extremities: Symmetrical in appearance without cyanosis, clubbing or effusion  Scheduled Meds:  Scheduled Meds: . ceFEPime (MAXIPIME) IV  1 g Intravenous Q24H  . dextromethorphan-guaiFENesin  1 tablet Oral BID  . feeding supplement (ENSURE COMPLETE)  237 mL Oral BID BM  . furosemide  20 mg Oral Daily  . levothyroxine  50 mcg Oral QAC breakfast  . sodium chloride  10-40 mL Intracatheter Q12H  . [START ON 05/15/2014] vancomycin  1,000 mg Intravenous Q48H  . Warfarin - Pharmacist Dosing Inpatient   Does not apply q1800    Data Reviewed: Basic Metabolic Panel:  Recent Labs Lab 05/10/14 0935 05/10/14 1002 05/10/14 1910 05/11/14 0454 05/12/14 0335 05/13/14 0740 05/14/14 0356  NA 124* 127*  --  126* 126* 126* 126*  K 5.0 4.7  --  4.5 3.9 3.3* 4.0  CL 88* 98  --  91* 89* 86* 88*  CO2 16*  --   --  13* 16* 20 17*  GLUCOSE 122* 125*  --  91 91 102* 110*  BUN 73* 76*  --  83* 92* 93* 95*  CREATININE 2.86* 3.20* 3.03* 3.11* 3.40* 3.48* 3.83*  CALCIUM 9.1  --   --  8.6 7.9* 8.0* 8.3*  MG  --   --   --   --   --  2.3 2.5   Liver Function Tests:  Recent Labs Lab 05/13/14 0740 05/14/14 0356  AST 145*  --   ALT 148*  --   ALKPHOS 179*  --   BILITOT 1.3*  --   PROT 6.2  --   ALBUMIN 2.5* 2.6*   CBC:  Recent Labs Lab 05/11/14 0454 05/12/14 0335 05/13/14 0740 05/13/14 1920 05/14/14 0356  WBC 16.6* 12.6* 13.4* 11.6* 11.6*  NEUTROABS  --   --  12.5* 10.8*  --   HGB 11.8* 11.1* 10.0* 9.6* 10.3*  HCT 34.9* 31.7* 29.1* 27.6* 30.9*  MCV 85.1 83.2 80.6 79.8 81.1  PLT 150 148* 156 159 171   Cardiac Enzymes:  Recent Labs Lab 05/10/14 1910 05/10/14 2310 05/11/14 0454  TROPONINI 2.15* 3.63* 2.96*   BNP (last 3 results)  Recent Labs  06/05/13 0838 07/29/13 0818 05/10/14 0935    PROBNP 17824.0* 3171.0* 16109.663888.0*    Recent Results (from the past 240 hour(s))  Urine culture     Status: None   Collection Time: 05/10/14 10:03 AM  Result Value Ref Range Status   Specimen Description URINE, CLEAN CATCH  Final   Special Requests NONE  Final   Culture  Setup Time   Final    05/10/2014 20:50 Performed at [facility]    Colony Count NO GROWTH Performed at [facility]   Final   Culture NO GROWTH Performed at [facility]   Final   Report Status 05/11/2014 FINAL  Final  Culture, blood (routine x 2)     Status: None (Preliminary result)   Collection Time: 05/10/14  7:05 PM  Result Value Ref Range Status   Specimen Description BLOOD LEFT FOREARM  Final   Special Requests BOTTLES DRAWN AEROBIC ONLY 4CC  Final   Culture  Setup Time   Final    05/11/2014 01:38 Performed at [facility]    Culture  Final           BLOOD CULTURE RECEIVED NO GROWTH TO DATE CULTURE WILL BE HELD FOR 5 DAYS BEFORE ISSUING A FINAL NEGATIVE REPORT Performed at [facility]    Report Status PENDING  Incomplete  Culture, blood (routine x 2)     Status: None (Preliminary result)   Collection Time: 05/10/14  7:10 PM  Result Value Ref Range Status   Specimen Description BLOOD RIGHT HAND  Final   Special Requests BOTTLES DRAWN AEROBIC ONLY 5CC  Final   Culture  Setup Time   Final    05/11/2014 01:38 Performed at [facility]    Culture   Final           BLOOD CULTURE RECEIVED NO GROWTH TO DATE CULTURE WILL BE HELD FOR 5 DAYS BEFORE ISSUING A FINAL NEGATIVE REPORT Performed at [facility]    Report Status PENDING  Incomplete     Studies:  Recent x-ray studies have been reviewed in detail by the Attending Physician  Time spent :  35 mins  Junious Silk, ANP [facility] Office  [phone] Pager [phone]  On-Call/Text Page:      [email or web address]      password [email or web address]  If 7PM-7AM, please contact  night-coverage [email or web address] Password [email or web address] 05/14/2014, 6:38 PM   LOS: 4 days   I have personally examined this patient and reviewed the entire database. I have reviewed the above note, made any necessary editorial changes, and agree with its content.  Lonia Blood, MD [facility]

## 2014-05-14 NOTE — Clinical Social Work Placement (Addendum)
Clinical Social Work Department CLINICAL SOCIAL WORK PLACEMENT NOTE 05/14/2014  Patient:  Clinton [name],Clinton [name]  Account Number:  [id number] Admit date:  05/10/2014  Clinical Social Worker:  Merlyn LotJENNA HOLOMAN, CLINICAL SOCIAL WORKER  Date/time:  05/14/2014 02:40 PM  Clinical Social Work is seeking post-discharge placement for this patient at the following level of care:   SKILLED NURSING   (*CSW will update this form in Epic as items are completed)   05/14/2014  Patient/family provided with [facility] Department of Clinical Social Work's list of facilities offering this level of care within the geographic area requested by the patient (or if unable, by the patient's family).  05/14/2014  Patient/family informed of their freedom to choose among providers that offer the needed level of care, that participate in Medicare, Medicaid or managed care program needed by the patient, have an available bed and are willing to accept the patient.  05/14/2014  Patient/family informed of [facility]' ownership interest in [facility], as well as of the fact that they are under no obligation to receive care at this facility.  PASARR submitted to EDS on 05/14/2014 PASARR number received on 05/14/2014  FL2 transmitted to all facilities in geographic area requested by pt/family on  05/14/2014 FL2 transmitted to all facilities within larger geographic area on   Patient informed that his/her managed care company has contracts with or will negotiate with  certain facilities, including the following:     Patient/family informed of bed offers received:  05/26/2014 [facility] and patient [facility]    [facility]  05/26/2014 Patient to be transferred to facility by Car (Family) Patient and family notified of transfer on 05/26/2014 Name of family member notified:  Clinton [name] (son)  The following physician request were  entered in Epic:   Additional Comments: Merlyn LotJenna Holoman, [facility] Clinical Social Worker [phone]

## 2014-05-14 NOTE — Progress Notes (Signed)
ANTIBIOTIC CONSULT NOTE - INITIAL  Pharmacy Consult for levaquin Indication: pneumonia  Allergies  Allergen Reactions  . Codeine Phosphate Nausea And Vomiting    Patient Measurements: Height: 5\' 11"  (180.3 cm) Weight: 149 lb 11.1 oz (67.9 kg) IBW/kg (Calculated) : 75.3   Vital Signs: Temp: 97.3 F (36.3 C) (11/18 1158) Temp Source: Oral (11/18 1158) BP: 90/48 mmHg (11/18 1618) Pulse Rate: 56 (11/18 1618) Intake/Output from previous day: 11/17 0701 - 11/18 0700 In: 976.2 [P.O.:222; I.V.:704.2; IV Piggyback:50] Out: 500 [Urine:500] Intake/Output from this shift:    Labs:  Recent Labs  05/12/14 0335 05/13/14 0740 05/13/14 1920 05/14/14 0356  WBC 12.6* 13.4* 11.6* 11.6*  HGB 11.1* 10.0* 9.6* 10.3*  PLT 148* 156 159 171  CREATININE 3.40* 3.48*  --  3.83*   Estimated Creatinine Clearance: 16.3 mL/min (by C-G formula based on Cr of 3.83).  Recent Labs  05/14/14 0954  VANCOTROUGH 18.0     Microbiology: Recent Results (from the past 720 hour(s))  Urine culture     Status: None   Collection Time: 05/10/14 10:03 AM  Result Value Ref Range Status   Specimen Description URINE, CLEAN CATCH  Final   Special Requests NONE  Final   Culture  Setup Time   Final    05/10/2014 20:50 Performed at [facility]    Colony Count NO GROWTH Performed at [facility]   Final   Culture NO GROWTH Performed at [facility]   Final   Report Status 05/11/2014 FINAL  Final  Culture, blood (routine x 2)     Status: None (Preliminary result)   Collection Time: 05/10/14  7:05 PM  Result Value Ref Range Status   Specimen Description BLOOD LEFT FOREARM  Final   Special Requests BOTTLES DRAWN AEROBIC ONLY 4CC  Final   Culture  Setup Time   Final    05/11/2014 01:38 Performed at [facility]    Culture   Final           BLOOD CULTURE RECEIVED NO GROWTH TO DATE CULTURE WILL BE HELD FOR 5 DAYS BEFORE ISSUING A FINAL NEGATIVE REPORT Performed at  [facility]    Report Status PENDING  Incomplete  Culture, blood (routine x 2)     Status: None (Preliminary result)   Collection Time: 05/10/14  7:10 PM  Result Value Ref Range Status   Specimen Description BLOOD RIGHT HAND  Final   Special Requests BOTTLES DRAWN AEROBIC ONLY 5CC  Final   Culture  Setup Time   Final    05/11/2014 01:38 Performed at [facility]    Culture   Final           BLOOD CULTURE RECEIVED NO GROWTH TO DATE CULTURE WILL BE HELD FOR 5 DAYS BEFORE ISSUING A FINAL NEGATIVE REPORT Performed at [facility]    Report Status PENDING  Incomplete    Medical History: Past Medical History  Diagnosis Date  . Peripheral arterial disease     a. Ao-bifem bypass w/ 16x8 hemashield dacron graft, R aortorenal bypass w/ 6mm dacron graft;  b. 09/2011 ABI: R 0/87, L 0.92.  . Carotid artery occlusion     a. 12/2002 R CEA;  b. 05/2007 L Carotid stenting;  c. 09/2008 repeat L carotid stenting 2/2 ISR;  d. 09/2011 Carotid U/S: RICA 40-59%, LICA 60-79%, >50 dist LCCA stenosis.  Marland Kitchen. CAD (coronary artery disease)     a. CABG 1985;  b. 05/2002 Rotablator/PTCA  to ostial LCX and RI;  c. 12/2009 MV: inf and lat infarct w/ minimal mid lateral and basilar ant ischemia, EF 29%->Med Rx.  . Cardiomyopathy, ischemic     a. 06/2010 Cardiac MRI: EF 30%, anterolat, post, inf prior subendocardial infarcts;  b. 08/2012 Echo: EF 30-35%, mild LVH mult wma's, Gr 2 DD;  c. 05/2013 Echo: EF 20-25%, diff HK, mod AS, mild MR, mod dil LA, mild to mod TR, PASP 63mmHg.  . Stroke     remote  . Hypertension   . Hyperlipidemia   . Tobacco abuse   . Anticoagulated on Coumadin   . CKD (chronic kidney disease), stage III   . Chronic systolic CHF (congestive heart failure)     a. 05/2013 EF 20-25%, diff HK.  . Moderate aortic stenosis     a. 05/2013 Echo: at least moderate AS (Valve area 1.07 cm2 - VTI; 1.08cm2 - Vmax).  . Hypothyroidism     a. 05/2013 TSH 50.15 - synthroid 50 mcg daily  started.    Medications:  Prescriptions prior to admission  Medication Sig Dispense Refill Last Dose  . carvedilol (COREG) 12.5 MG tablet Take 1.5 tablets (18.75 mg total) by mouth 2 (two) times daily. 90 tablet 12 05/10/2014 at 0830  . ezetimibe (ZETIA) 10 MG tablet Take 10 mg by mouth daily.     05/09/2014 at Unknown time  . furosemide (LASIX) 20 MG tablet Take 1 tablet (20 mg total) by mouth daily. 30 tablet 6 05/09/2014 at Unknown time  . hydrALAZINE (APRESOLINE) 50 MG tablet Take 1 tablet (50 mg total) by mouth 3 (three) times daily. 90 tablet 2 05/10/2014 at Unknown time  . isosorbide mononitrate (IMDUR) 30 MG 24 hr tablet Take 1 tablet (30 mg total) by mouth daily. 30 tablet 6 05/10/2014 at Unknown time  . levothyroxine (SYNTHROID, LEVOTHROID) 50 MCG tablet Take 1 tablet (50 mcg total) by mouth daily before breakfast. 30 tablet 3 05/10/2014 at Unknown time  . losartan (COZAAR) 100 MG tablet Take 100 mg by mouth daily.     05/10/2014 at Unknown time  . rosuvastatin (CRESTOR) 40 MG tablet Take 1 tablet (40 mg total) by mouth daily. 30 tablet 6 05/09/2014 at Unknown time  . warfarin (COUMADIN) 6 MG tablet Take 3-6 mg by mouth daily. Take 3 mg on tue and sat and 6 mg all other days   05/09/2014 at Unknown time   Assessment: 73 yo man on vanc and cefepiem to change to levaquin for CAP.  His CrCl ~16 ml/min.  Goal of Therapy:  Eradication of infection  Plan:  Levaquin 750 mg IV X 1 then 500 mg IV q48 hours F/u renal function, cultures and clinical course.  Thanks for allowing pharmacy to be a part of this patient's care.  Talbert CageLora Koy Lamp, PharmD Clinical Pharmacist, [phone] 05/14/2014,7:05 PM

## 2014-05-14 NOTE — Clinical Documentation Improvement (Signed)
  ABG 05/13/14 at 0727   Left Radial Arterial Draw pH       7.38 pCo2   31.8 p02      44.2 HC03   18.7 02 Sat  76.6  Please document the clinical significance of the ABG results in the progress notes and discharge summary.  Thank You, Jerral Ralphathy R Koleson Reifsteck ,RN Clinical Documentation Specialist:  [phone] [facility]- [facility] Information Management

## 2014-05-15 DIAGNOSIS — I951 Orthostatic hypotension: Secondary | ICD-10-CM

## 2014-05-15 LAB — CBC
HCT: 26.9 % — ABNORMAL LOW (ref 39.0–52.0)
Hemoglobin: 9 g/dL — ABNORMAL LOW (ref 13.0–17.0)
MCH: 26.8 pg (ref 26.0–34.0)
MCHC: 33.5 g/dL (ref 30.0–36.0)
MCV: 80.1 fL (ref 78.0–100.0)
Platelets: 188 10*3/uL (ref 150–400)
RBC: 3.36 MIL/uL — ABNORMAL LOW (ref 4.22–5.81)
RDW: 15.3 % (ref 11.5–15.5)
WBC: 13.2 10*3/uL — ABNORMAL HIGH (ref 4.0–10.5)

## 2014-05-15 LAB — COMPREHENSIVE METABOLIC PANEL
ALBUMIN: 2.2 g/dL — AB (ref 3.5–5.2)
ALT: 140 U/L — ABNORMAL HIGH (ref 0–53)
ANION GAP: 20 — AB (ref 5–15)
AST: 131 U/L — AB (ref 0–37)
Alkaline Phosphatase: 241 U/L — ABNORMAL HIGH (ref 39–117)
BUN: 97 mg/dL — ABNORMAL HIGH (ref 6–23)
CO2: 18 mEq/L — ABNORMAL LOW (ref 19–32)
CREATININE: 3.52 mg/dL — AB (ref 0.50–1.35)
Calcium: 7.9 mg/dL — ABNORMAL LOW (ref 8.4–10.5)
Chloride: 95 mEq/L — ABNORMAL LOW (ref 96–112)
GFR calc Af Amer: 18 mL/min — ABNORMAL LOW (ref >90)
GFR calc non Af Amer: 16 mL/min — ABNORMAL LOW (ref >90)
Glucose, Bld: 145 mg/dL — ABNORMAL HIGH (ref 70–99)
Potassium: 3.9 mEq/L (ref 3.7–5.3)
Sodium: 133 mEq/L — ABNORMAL LOW (ref 137–147)
Total Bilirubin: 0.8 mg/dL (ref 0.3–1.2)
Total Protein: 5.9 g/dL — ABNORMAL LOW (ref 6.0–8.3)

## 2014-05-15 LAB — PROTIME-INR
INR: 6.61 — AB (ref 0.00–1.49)
Prothrombin Time: 58.2 seconds — ABNORMAL HIGH (ref 11.6–15.2)

## 2014-05-15 LAB — MAGNESIUM: Magnesium: 2.3 mg/dL (ref 1.5–2.5)

## 2014-05-15 MED ORDER — PHYTONADIONE 5 MG PO TABS
5.0000 mg | ORAL_TABLET | Freq: Once | ORAL | Status: AC
  Administered 2014-05-15: 5 mg via ORAL
  Filled 2014-05-15: qty 1

## 2014-05-15 MED ORDER — MAGNESIUM HYDROXIDE 400 MG/5ML PO SUSP
30.0000 mL | Freq: Once | ORAL | Status: AC
  Administered 2014-05-15: 30 mL via ORAL
  Filled 2014-05-15: qty 30

## 2014-05-15 MED ORDER — BISACODYL 10 MG RE SUPP: 10.0000 mg | Freq: Every day | RECTAL | Status: DC | PRN

## 2014-05-15 MED ORDER — DOCUSATE SODIUM 100 MG PO CAPS
100.0000 mg | ORAL_CAPSULE | Freq: Two times a day (BID) | ORAL | Status: DC
Start: 2014-05-15 — End: 2014-05-26
  Administered 2014-05-15 – 2014-05-25 (×19): 100 mg via ORAL
  Filled 2014-05-15 (×25): qty 1

## 2014-05-15 NOTE — Care Management Note (Addendum)
    Page 1 of 1   05/23/2014     3:43:40 PM CARE MANAGEMENT NOTE 05/23/2014  Patient:  [name],[name]   Account Number:  [id number]  Date Initiated:  05/13/2014  Documentation initiated by:  [name],Clinton  Subjective/Objective Assessment:   dx PNA; lives with family    PCP  Clinton BorerJames [name]     Action/Plan:   Anticipated DC Date:  05/26/2014   Anticipated DC Plan:  SKILLED NURSING FACILITY  In-house referral  Clinical Social Worker      DC Planning Services  CM consult      Choice offered to / List presented to:             Status of service:  Completed, signed off Medicare Important Message given?  YES (If response is "NO", the following Medicare IM given date fields will be blank) Date Medicare IM given:  05/21/2014 Medicare IM given by:  HUTCHINSON,CRYSTAL Date Additional Medicare IM given:  05/23/2014 Additional Medicare IM given by:  Kristan Votta  Discharge Disposition:  SKILLED NURSING FACILITY  Per UR Regulation:  Reviewed for med. necessity/level of care/duration of stay  If discussed at Long Length of Stay Meetings, dates discussed:   05/15/2014  05/20/2014    Comments:  Donato Schultzrystal Hutchinson RN, BSN, MSHL, CCM  Nurse - Case Manager,  (Unit [facility])  [phone]  05/21/2014 No change in dispo plan Dispo Plan:  SNF/[name] (SW/Clinton [name] active: Please see SW note for further details)   Donato Schultzrystal Hutchinson RN, BSN, MSHL, CCM  Nurse - Case Manager,  (Unit [phone])  [phone]  05/19/2014 Dispo Plan:  SNF/[name] (SW/Clinton [name] active: Please see SW note for further details)  05/15/14 0958 Clinton PrimeHenrietta Mayo RN MSN BSN CCM PT/OT recommend SNF and pt agrees, CSW following.

## 2014-05-15 NOTE — Progress Notes (Signed)
Patient Name: [name] Betton Date of Encounter: 05/15/2014     Principal Problem:   CAP (community acquired pneumonia) Active Problems:   Essential hypertension   CAD, ARTERY BYPASS GRAFT   Peripheral vascular disease   Acute on chronic systolic CHF (congestive heart failure), NYHA class 4   AKI (acute kidney injury)   Sepsis   PNA (pneumonia)   Demand ischemia   Sepsis due to other etiology   Transaminitis    SUBJECTIVE  The patient is in no acute distress today.  Nonproductive cough.  He is afebrile.  Blood pressure is improved off diuretic.  CURRENT MEDS . dextromethorphan-guaiFENesin  1 tablet Oral BID  . feeding supplement (ENSURE COMPLETE)  237 mL Oral BID BM  . [START ON 05/16/2014] levofloxacin (LEVAQUIN) IV  500 mg Intravenous Q48H  . levothyroxine  50 mcg Oral QAC breakfast  . phytonadione  5 mg Oral Once  . sodium chloride  10-40 mL Intracatheter Q12H  . Warfarin - Pharmacist Dosing Inpatient   Does not apply q1800    OBJECTIVE  Filed Vitals:   05/14/14 2000 05/15/14 0000 05/15/14 0400 05/15/14 0803  BP: 110/47 158/97 137/60 135/55  Pulse: 58 42 57 61  Temp: 97.5 F (36.4 C) 97.5 F (36.4 C) 97.6 F (36.4 C) 97.3 F (36.3 C)  TempSrc: Oral Oral Oral Oral  Resp: 18 15 18 20   Height:      Weight:      SpO2: 96% 94% 96% 95%    Intake/Output Summary (Last 24 hours) at 05/15/14 0837 Last data filed at 05/15/14 0805  Gross per 24 hour  Intake 1257.33 ml  Output   1100 ml  Net 157.33 ml   Filed Weights   05/11/14 0438 05/12/14 0430 05/12/14 0611  Weight: 153 lb 3.5 oz (69.5 kg) 150 lb 9.2 oz (68.3 kg) 149 lb 11.1 oz (67.9 kg)    PHYSICAL EXAM  General: Pleasant, NAD. Neuro: Alert and oriented X 3. Moves all extremities spontaneously. Psych: Normal affect. HEENT:  Normal  Neck: Supple without bruits or JVD. Lungs:  Resp regular and unlabored, scattered rhonchi. Heart: RRR no s3, s4.  Grade 2/6 apical systolic murmur. Abdomen: Soft,  non-tender, non-distended, BS + x 4.  Extremities: No clubbing, cyanosis or edema. DP/PT/Radials 2+ and equal bilaterally.  Accessory Clinical Findings  CBC  Recent Labs  05/13/14 0740 05/13/14 1920 05/14/14 0356 05/15/14 0656  WBC 13.4* 11.6* 11.6* 13.2*  NEUTROABS 12.5* 10.8*  --   --   HGB 10.0* 9.6* 10.3* 9.0*  HCT 29.1* 27.6* 30.9* 26.9*  MCV 80.6 79.8 81.1 80.1  PLT 156 159 171 188   Basic Metabolic Panel  Recent Labs  05/13/14 0740 05/14/14 0356 05/15/14 0500  NA 126* 126* 133*  K 3.3* 4.0 3.9  CL 86* 88* 95*  CO2 20 17* 18*  GLUCOSE 102* 110* 145*  BUN 93* 95* 97*  CREATININE 3.48* 3.83* 3.52*  CALCIUM 8.0* 8.3* 7.9*  MG 2.3 2.5  --    Liver Function Tests  Recent Labs  05/13/14 0740 05/14/14 0356 05/15/14 0500  AST 145*  --  131*  ALT 148*  --  140*  ALKPHOS 179*  --  241*  BILITOT 1.3*  --  0.8  PROT 6.2  --  5.9*  ALBUMIN 2.5* 2.6* 2.2*   No results for input(s): LIPASE, AMYLASE in the last 72 hours. Cardiac Enzymes No results for input(s): CKTOTAL, CKMB, CKMBINDEX, TROPONINI in the last 72 hours.  BNP Invalid input(s): POCBNP D-Dimer No results for input(s): DDIMER in the last 72 hours. Hemoglobin A1C No results for input(s): HGBA1C in the last 72 hours. Fasting Lipid Panel No results for input(s): CHOL, HDL, LDLCALC, TRIG, CHOLHDL, LDLDIRECT in the last 72 hours. Thyroid Function Tests No results for input(s): TSH, T4TOTAL, T3FREE, THYROIDAB in the last 72 hours.  Invalid input(s): FREET3  TELE  Normal sinus rhythm with left bundle branch block.  ECG    Radiology/Studies  Ct Abdomen Pelvis Wo Contrast  05/10/2014   CLINICAL DATA:  Right lower quadrant pain for 5 days  EXAM: CT ABDOMEN AND PELVIS WITHOUT CONTRAST  TECHNIQUE: Multidetector CT imaging of the abdomen and pelvis was performed following the standard protocol without IV contrast.  COMPARISON:  None.  FINDINGS: Lung bases show of the left lung base be within normal  limits. Right lower lobe pneumonia with associated effusion is seen.  The liver, gallbladder, spleen, adrenal glands and pancreas are within normal limits. The kidneys are well visualized bilaterally and demonstrate renal cystic change. Some of these have attenuation greater than normal fluid and likely represent hemorrhagic cysts. No renal calculi or obstructive changes are seen. Changes consistent with aortobifemoral bypass graft are noted. The appendix is within normal limits. No definitive inflammatory changes are seen.  Bladder is partially distended. Free fluid is noted within the pelvis although no cause towards factors are seen. Small fluid containing left inguinal hernia is noted. Bony structures are within normal limits.  IMPRESSION: Right lower lobe pneumonia with associated effusion.  Free fluid within the pelvis of uncertain etiology.  Chronic changes as described.   Electronically Signed   By: Alcide CleverMark  Lukens M.D.   On: 05/10/2014 13:35   X-ray Chest Pa And Lateral  05/11/2014   CLINICAL DATA:  Pneumonia  EXAM: CHEST  2 VIEW  COMPARISON:  05/10/2014  FINDINGS: Cardiomediastinal silhouette is stable. Persistent small right pleural effusion with right lower lobe infiltrate/pneumonia. Status post median sternotomy. Central mild bronchitic changes.  IMPRESSION: Persistent small right pleural effusion with right lower lobe infiltrate/ pneumonia. No convincing pulmonary edema. Central mild bronchitic changes.   Electronically Signed   By: Natasha MeadLiviu  Pop M.D.   On: 05/11/2014 11:31   Dg Chest Port 1 View  05/10/2014   CLINICAL DATA:  Weakness.  Shortness of breath for 3 days.  Fatigue.  EXAM: PORTABLE CHEST - 1 VIEW  COMPARISON:  Two-view chest 06/05/2013.  FINDINGS: The heart is mildly enlarged. And chronic interstitial changes are similar to the prior study. This likely reflects some element of edema superimposed on chronic change. There is persistent blunting of the right costophrenic angle suggesting a  small effusion or chronic pleural thickening. No focal airspace consolidation is evident.  IMPRESSION: 1. Cardiomegaly with mild edema superimposed on chronic interstitial coarsening. This likely reflects mild congestive heart failure. 2. Suspect small right pleural effusion.   Electronically Signed   By: Gennette Pachris  Mattern M.D.   On: 05/10/2014 10:26    ASSESSMENT AND PLAN 1. Acute on chronic systolic congestive heart failure/ICM. Echocardiogram on 06/06/13 showed ejection fraction 20-25% with mild mitral regurgitation and moderate aortic stenosis.  The patient is off diuretic at the present time    2. Demand ischemia. Troponins trending down. Peal 3.63--> 2.96  3. Pneumonia/sepsis on broad-spectrum antibiotics.  4. Chronic warfarin anticoagulation for severe vascular disease -- INR supratheraptuic.  5. Acute on chronic kidney injury. BUN still rising but creatinine is improving.  No evidence for internal bleeding.  6. Low blood pressure- not on any antihypertensives. This is improving.   Signed, Cassell Clement MD

## 2014-05-15 NOTE — Progress Notes (Signed)
CRITICAL VALUE ALERT  Critical value received:  INR 6.61  Date of notification:  05/15/14  Time of notification:  0807  Critical value read back:Yes.    Nurse who received alert:  Nicole CellaStephanie Etan Vasudevan, RN  MD notified (1st page):  Dr Joseph ArtWoods  Time of first page:  0808  MD notified (2nd page):  Time of second page:  Responding MD:  Junious SilkAllison Ellis, NP  Time MD responded:  [phone]

## 2014-05-15 NOTE — Progress Notes (Signed)
ANTICOAGULATION CONSULT NOTE - Follow Up Consult  Pharmacy Consult for Warfarin  Indication: Hx CVA/severe PAD   Allergies  Allergen Reactions  . Codeine Phosphate Nausea And Vomiting    Patient Measurements: Height: 5\' 11"  (180.3 cm) Weight: 149 lb 11.1 oz (67.9 kg) IBW/kg (Calculated) : 75.3  Vital Signs: Temp: 97.3 F (36.3 C) (11/19 0803) Temp Source: Oral (11/19 0803) BP: 135/55 mmHg (11/19 0803) Pulse Rate: 61 (11/19 0803)  Labs:  Recent Labs  05/12/14 1231 05/12/14 2248  05/13/14 0740 05/13/14 1920 05/14/14 0356 05/15/14 0500 05/15/14 0656  HGB  --   --   < > 10.0* 9.6* 10.3*  --  9.0*  HCT  --   --   < > 29.1* 27.6* 30.9*  --  26.9*  PLT  --   --   < > 156 159 171  --  188  LABPROT  --   --   < > 35.7* 41.3* 43.8*  --  58.2*  INR  --   --   < > 3.54* 4.26* 4.60*  --  6.61*  HEPARINUNFRC <0.10* 0.11*  --  0.32  --   --   --   --   CREATININE  --   --   --  3.48*  --  3.83* 3.52*  --   < > = values in this interval not displayed.  Estimated Creatinine Clearance: 17.7 mL/min (by C-G formula based on Cr of 3.52).   Assessment: [age] YOM admitted on 11/14 with a SUPRAtherapeutic INR of 8.53 on PTA dose of 6 mg daily EXCEPT for 3 mg on Tues/Sat. The INR was reversed and resumed on 11/15 with a heparin bridge for hx CVA/severe PAD. Heparin bridge was stopped on 11/17 due to an elevated INR. INR today is 6.61 with trend up (possibly to to liver function and antibiotics). Patient noted for 5mg  po vitamin K today.  Goal of Therapy:  INR 2-3 Vancomycin trough of 15-20 mcg/ml   Plan:  - Hold warfarin today -Daily PT/INR  Harland GermanAndrew Graysin Luczynski, Pharm D 05/15/2014 8:54 AM

## 2014-05-15 NOTE — Progress Notes (Signed)
[facility]  [name] Dahan ZOX:[id number] DOB: 1941-04-10 DOA: 05/10/2014 PCP: Arlan OrganMANNING, JAMES S., MD  Brief narrative: 73 year old gentleman with extensive medical history including coronary artery disease status post CABG, ischemic cardiomyopathy, and chronic systolic congestive heart failure with an ejection fraction of 20-25% per echocardiogram from 06/06/2013 who was admitted to the Medicine Service on 05/10/2014 after presenting with complaints of shortness of breath, cough and reported steep functional decline for the previous 24-48 hours.   Initial workup revealed presence of right lower lobe pneumonia seen on CT scan. He was also found to be in acute on chronic renal failure, and had elevated troponin initially of 0.56. His BNP was also elevated at 63,888. Case was discussed with cardiology given his extensive cardiac history and presence of acute on chronic systolic congestive heart failure. He was admitted to the step down unit and started on broad-spectrum IV antibiotic therapy with vancomycin and cefepime. Cardiology started IV diuresis. His troponin peaked at 3.63. He did not complain of chest pain.  As of 11/19 blood pressure has rebounded and therefore IV fluids decreased to keep open. INR has increased to greater than 6 and patient with some hemoptysis. Has been given a dose of vitamin K . No other signs of bleeding observed. Renal function and LFTs slowly trending downward. Also reporting issues with no bowel movement for many days so medications initiated.  HPI/Subjective: Endorses coughing with hemoptysis; no BM for greater than 3 days; not as weak.  Assessment/Plan:  Sepsis w/ RLL CAP -Symptoms at admission of RR 28, white count of 14,800, lactate 3.04 with source of infection community-acquired pneumonia -cont cefepime and vancomycin  -Blood cultures obtained on 05/10/2014: no growth to date -CT abd/pelvis with incidental finding  of RLL PNA -Repeat chest x-ray on 05/11/2014 showed persistent small right pleural effusion with right lower lobe infiltrate/pneumonia -Continue supportive care  Acute on chronic systolic congestive heart failure. -known EF of 20-25% per transthoracic echocardiogram performed on 06/06/2013. -aggressively diuresed with Lasix gtt - SCr up to 3.5 11/16 so Cards transitioned to BID IV Lasix -CHF remains compensated despite IVFs: not hypoxic, no LE edema and progressive renal failure and orthostatic (see below) so Lasix dc'd 11/17 and remains on hold as of 11/19  Demand ischemia -Patient presented with sepsis, troponins peaked at 3.63 with current trend down -did not have CP -Cardiology following  Coagulopathy/chronic anticoagulation for PVD  -Presented with supratherapeutic INR of 8.53, administered vitamin K and INR decreased to 1.7 and continues to increase, now > 6, so will give Vit K esp with hemoptysis -Pharmacy managing Coumadin -Coumadin on hold -INR greater than 6 in patient with some hemoptysis so we'll give dose vitamin K -Hgb down to 9.0 with baseline 11- transfuse only if <7  Hypotension with orthostasis -Due to volume depletion  -Responded to discontinuation of Lasix, holding antihypertensive medications, and trial of IV fluids with when necessary boluses -Blood pressure has rebounded [facility]  Acute on chronic renal failure/pre renal/volume depletion. -baseline renal function 35/1.4 -pt presented with ARF: 73/2.86 but felt to be due to decompensated CHF and resultant low renal perfusion  -11/16 Scr up to 3.4 so Lasix gtt stopped -11/17 BUN 93 and Scr 3.48 with noted orthostasis -11/19 blood pressure up; BUN has risen slightly to 97 but creatinine has decreased to 3.52  Transaminitis -Likely due to shock liver syndrome in setting of hypotension and low cardiac output from severe  chronic systolic heart failure -hold statin until LFTs recover-current trend is  slowly downward-bilirubin normal   Constipation -Milk of magnesia 1 -Begin twice a day Colace -Provide when necessary Dulcolax suppository   Physical deconditioning PT rec SNF at dc  DVT prophylaxis: Coumadin-currently on hold due to supratherapeutic INR Code Status: Full Family Communication: No family at bedside Disposition Plan/Expected LOS: SDU  Consultants: Cardiology/Dr. Patty SermonsBrackbill  Procedures: None  Antibiotics: Cefepime 11/14 > Vancomycin 11/14 > 11/18  Objective: Blood pressure 135/55, pulse 61, temperature 97.3 F (36.3 C), temperature source Oral, resp. rate 20, height 5\' 11"  (1.803 m), weight 149 lb 11.1 oz (67.9 kg), SpO2 95 %.  Intake/Output Summary (Last 24 hours) at 05/15/14 1051 Last data filed at 05/15/14 1048  Gross per 24 hour  Intake   1313 ml  Output   1250 ml  Net     63 ml   Exam: Gen: No acute respiratory distress  Chest: Continues with decreased air movement to right base - no wheezes-room air  Cardiac: Regular rate and rhythm, S1-S2, no rubs murmurs or gallops, no peripheral edema, no JVD Abdomen: Soft nontender nondistended without obvious hepatosplenomegaly, no ascites Extremities: Symmetrical in appearance without cyanosis, clubbing or effusion  Scheduled Meds:  Scheduled Meds: . dextromethorphan-guaiFENesin  1 tablet Oral BID  . docusate sodium  100 mg Oral BID  . feeding supplement (ENSURE COMPLETE)  237 mL Oral BID BM  . [START ON 05/16/2014] levofloxacin (LEVAQUIN) IV  500 mg Intravenous Q48H  . levothyroxine  50 mcg Oral QAC breakfast  . magnesium hydroxide  30 mL Oral Once  . sodium chloride  10-40 mL Intracatheter Q12H  . Warfarin - Pharmacist Dosing Inpatient   Does not apply q1800    Data Reviewed: Basic Metabolic Panel:  Recent Labs Lab 05/11/14 0454 05/12/14 0335 05/13/14 0740 05/14/14 0356 05/15/14 0500  NA 126* 126* 126* 126* 133*  K 4.5 3.9 3.3* 4.0 3.9  CL 91* 89* 86* 88* 95*  CO2 13* 16* 20 17* 18*    GLUCOSE 91 91 102* 110* 145*  BUN 83* 92* 93* 95* 97*  CREATININE 3.11* 3.40* 3.48* 3.83* 3.52*  CALCIUM 8.6 7.9* 8.0* 8.3* 7.9*  MG  --   --  2.3 2.5 2.3   Liver Function Tests:  Recent Labs Lab 05/13/14 0740 05/14/14 0356 05/15/14 0500  AST 145*  --  131*  ALT 148*  --  140*  ALKPHOS 179*  --  241*  BILITOT 1.3*  --  0.8  PROT 6.2  --  5.9*  ALBUMIN 2.5* 2.6* 2.2*   CBC:  Recent Labs Lab 05/12/14 0335 05/13/14 0740 05/13/14 1920 05/14/14 0356 05/15/14 0656  WBC 12.6* 13.4* 11.6* 11.6* 13.2*  NEUTROABS  --  12.5* 10.8*  --   --   HGB 11.1* 10.0* 9.6* 10.3* 9.0*  HCT 31.7* 29.1* 27.6* 30.9* 26.9*  MCV 83.2 80.6 79.8 81.1 80.1  PLT 148* 156 159 171 188   Cardiac Enzymes:  Recent Labs Lab 05/10/14 1910 05/10/14 2310 05/11/14 0454  TROPONINI 2.15* 3.63* 2.96*   BNP (last 3 results)  Recent Labs  06/05/13 0838 07/29/13 0818 05/10/14 0935  PROBNP 17824.0* 3171.0* 16109.663888.0*    Recent Results (from the past 240 hour(s))  Urine culture     Status: None   Collection Time: 05/10/14 10:03 AM  Result Value Ref Range Status   Specimen Description URINE, CLEAN CATCH  Final   Special Requests NONE  Final   Culture  Setup Time   Final    05/10/2014 20:50 Performed at [facility] Count NO GROWTH Performed at [facility]   Final   Culture NO GROWTH Performed at [facility]   Final   Report Status 05/11/2014 FINAL  Final  Culture, blood (routine x 2)     Status: None (Preliminary result)   Collection Time: 05/10/14  7:05 PM  Result Value Ref Range Status   Specimen Description BLOOD LEFT FOREARM  Final   Special Requests BOTTLES DRAWN AEROBIC ONLY 4CC  Final   Culture  Setup Time   Final    05/11/2014 01:38 Performed at [facility]    Culture   Final           BLOOD CULTURE RECEIVED NO GROWTH TO DATE CULTURE WILL BE HELD FOR 5 DAYS BEFORE ISSUING A FINAL NEGATIVE REPORT Performed at [facility]     Report Status PENDING  Incomplete  Culture, blood (routine x 2)     Status: None (Preliminary result)   Collection Time: 05/10/14  7:10 PM  Result Value Ref Range Status   Specimen Description BLOOD RIGHT HAND  Final   Special Requests BOTTLES DRAWN AEROBIC ONLY 5CC  Final   Culture  Setup Time   Final    05/11/2014 01:38 Performed at [facility]    Culture   Final           BLOOD CULTURE RECEIVED NO GROWTH TO DATE CULTURE WILL BE HELD FOR 5 DAYS BEFORE ISSUING A FINAL NEGATIVE REPORT Performed at [facility]    Report Status PENDING  Incomplete     Studies:  Recent x-ray studies have been reviewed in detail by the Attending Physician  Time spent :  40 mins  Junious Silk, ANP [facility] Office  [phone] Pager [phone]  On-Call/Text Page:      [email or web address]      password [email or web address]  If 7PM-7AM, please contact night-coverage [email or web address] Password [email or web address] 05/15/2014, 10:51 AM   LOS: 5 days   Examined patient and discussed assessment and plan with ANP Revonda Standard and agree with above. Patient with multiple complex medical problems> 40 minutes spent on direct patient care

## 2014-05-16 LAB — CBC
HCT: 30.5 % — ABNORMAL LOW (ref 39.0–52.0)
HEMOGLOBIN: 10.2 g/dL — AB (ref 13.0–17.0)
MCH: 27.1 pg (ref 26.0–34.0)
MCHC: 33.4 g/dL (ref 30.0–36.0)
MCV: 80.9 fL (ref 78.0–100.0)
Platelets: 234 10*3/uL (ref 150–400)
RBC: 3.77 MIL/uL — ABNORMAL LOW (ref 4.22–5.81)
RDW: 15.5 % (ref 11.5–15.5)
WBC: 12.4 10*3/uL — ABNORMAL HIGH (ref 4.0–10.5)

## 2014-05-16 LAB — COMPREHENSIVE METABOLIC PANEL
ALT: 144 U/L — ABNORMAL HIGH (ref 0–53)
AST: 112 U/L — ABNORMAL HIGH (ref 0–37)
Albumin: 2.5 g/dL — ABNORMAL LOW (ref 3.5–5.2)
Alkaline Phosphatase: 272 U/L — ABNORMAL HIGH (ref 39–117)
Anion gap: 20 — ABNORMAL HIGH (ref 5–15)
BILIRUBIN TOTAL: 0.9 mg/dL (ref 0.3–1.2)
BUN: 98 mg/dL — ABNORMAL HIGH (ref 6–23)
CALCIUM: 8.6 mg/dL (ref 8.4–10.5)
CHLORIDE: 94 meq/L — AB (ref 96–112)
CO2: 20 meq/L (ref 19–32)
Creatinine, Ser: 3.37 mg/dL — ABNORMAL HIGH (ref 0.50–1.35)
GFR calc Af Amer: 19 mL/min — ABNORMAL LOW (ref >90)
GFR, EST NON AFRICAN AMERICAN: 17 mL/min — AB (ref >90)
Glucose, Bld: 92 mg/dL (ref 70–99)
Potassium: 4.3 mEq/L (ref 3.7–5.3)
Sodium: 134 mEq/L — ABNORMAL LOW (ref 137–147)
Total Protein: 6.4 g/dL (ref 6.0–8.3)

## 2014-05-16 LAB — PROTIME-INR
INR: 2.23 — ABNORMAL HIGH (ref 0.00–1.49)
Prothrombin Time: 24.9 seconds — ABNORMAL HIGH (ref 11.6–15.2)

## 2014-05-16 MED ORDER — ZOLPIDEM TARTRATE 5 MG PO TABS
5.0000 mg | ORAL_TABLET | Freq: Every evening | ORAL | Status: DC | PRN
  Administered 2014-05-16 – 2014-05-20 (×2): 5 mg via ORAL
  Filled 2014-05-16 (×2): qty 1

## 2014-05-16 MED ORDER — WARFARIN SODIUM 2 MG PO TABS
2.0000 mg | ORAL_TABLET | Freq: Once | ORAL | Status: AC
  Administered 2014-05-16: 2 mg via ORAL
  Filled 2014-05-16: qty 1

## 2014-05-16 NOTE — Clinical Social Work Note (Signed)
CSW spoke with patients son, [name], who [facility]  [facility] and [facility].  CSW provided hand off to 3E CSW.  Merlyn LotJenna Holoman, LCSWA Clinical Social Worker [phone]

## 2014-05-16 NOTE — Progress Notes (Signed)
Physical Therapy Treatment Patient Details Name: Clinton [name] MRN: [id number] DOB: 12-01-1940 Today's Date: 05/16/2014    History of Present Illness Patient with complex medical history presents with sepsis and work up revealing Community-acquired pneumonia.     PT Comments    Patient continues to progress with mobility, ambulated in hall with RW, patient with instability upon fatigue, HR did elevated to 150s with ambulation. Will continue to see and progress as tolerated.  Follow Up Recommendations  SNF;Supervision/Assistance - 24 hour     Equipment Recommendations  Rolling walker with 5" wheels (pending progress)    Recommendations for Other Services       Precautions / Restrictions Precautions Precautions: Fall Restrictions Weight Bearing Restrictions: No    Mobility  Bed Mobility               General bed mobility comments: received in chair  Transfers Overall transfer level: Needs assistance Equipment used: Rolling walker (2 wheeled);1 person hand held assist Transfers: Sit to/from Stand Sit to Stand: Min guard         General transfer comment: VCs for hand placement, min guard for stability  Ambulation/Gait Ambulation/Gait assistance: Min assist Ambulation Distance (Feet): 160 Feet Assistive device: Rolling walker (2 wheeled) Gait Pattern/deviations: Step-through pattern;Decreased stride length;Shuffle;Drifts right/left;Narrow base of support Gait velocity: decreased Gait velocity interpretation: <1.8 ft/sec, indicative of risk for recurrent falls General Gait Details: increased fatigue with increased distance, instbaility noted, heavy reliance on RW, increased instability with faitigue   Stairs            Wheelchair Mobility    Modified Rankin (Stroke Patients Only)       Balance   Sitting-balance support: Feet supported Sitting balance-Leahy Scale: Fair     Standing balance support: Bilateral upper extremity  supported;During functional activity Standing balance-Leahy Scale: Poor                      Cognition Arousal/Alertness: Awake/alert Behavior During Therapy: Flat affect Overall Cognitive Status: Impaired/Different from baseline Area of Impairment: Following commands;Problem solving       Following Commands: Follows one step commands inconsistently;Follows one step commands with increased time     Problem Solving: Decreased initiation;Difficulty sequencing;Requires verbal cues;Requires tactile cues      Exercises      General Comments        Pertinent Vitals/Pain Pain Assessment: No/denies pain    Home Living Family/patient expects to be discharged to:: Private residence Living Arrangements: Children Available Help at Discharge: Family Type of Home: House Home Access: Stairs to enter Entrance Stairs-Rails: None Home Layout: One level Home Equipment: Cane - single point      Prior Function Level of Independence: Independent          PT Goals (current goals can now be found in the care plan section) Acute Rehab PT Goals Patient Stated Goal: to feel better PT Goal Formulation: With patient Time For Goal Achievement: 05/27/14 Potential to Achieve Goals: Good Progress towards PT goals: Progressing toward goals    Frequency  Min 3X/week    PT Plan Current plan remains appropriate    Co-evaluation             End of Session Equipment Utilized During Treatment: Gait belt Activity Tolerance: Patient limited by fatigue Patient left: in chair;with call bell/phone within reach;with family/visitor present     Time: [id number] PT Time Calculation (min) (ACUTE ONLY): 19 min  Charges:  $Gait Training: 8-22 mins  G CodesFabio Asa:      [name] J 05/16/2014, 10:32 AM [name], PT DPT  [phone]

## 2014-05-16 NOTE — Plan of Care (Signed)
Problem: Phase I Progression Outcomes Goal: Code status addressed with pt/family Outcome: Completed/Met Date Met:  05/16/14 Goal: Hemodynamically stable Outcome: Completed/Met Date Met:  05/16/14

## 2014-05-16 NOTE — Clinical Social Work Note (Signed)
CSW called to offer bed choice to patients son, [name],  Left message.  CSW will continue to follow.  Merlyn LotJenna Holoman, LCSWA Clinical Social Worker [phone]

## 2014-05-16 NOTE — Progress Notes (Signed)
Patient had one episode of vomiting dutin breakfast. PA made aware, zofran given. Family advised that patient should be OOB in chair for meals with minimal distraction for carefull swallowing. Pt transferred to 3E at 10:40. Receiving RN made aware of blood tinged sputum.

## 2014-05-16 NOTE — Progress Notes (Signed)
Patient Name: DEQUINCY BORN Date of Encounter: 05/16/2014     Principal Problem:   CAP (community acquired pneumonia) Active Problems:   Essential hypertension   CAD, ARTERY BYPASS GRAFT   Peripheral vascular disease   Acute on chronic systolic CHF (congestive heart failure), NYHA class 4   AKI (acute kidney injury)   Sepsis   PNA (pneumonia)   Demand ischemia   Sepsis due to other etiology   Transaminitis   Orthostatic hypotension    SUBJECTIVE  Feels stronger today.  No chest pain or increase in dyspnea.  Oxygen sat 93%on room air. Rhythm is NSR with occasional PVCs. Finally had BM last night.  CURRENT MEDS . dextromethorphan-guaiFENesin  1 tablet Oral BID  . docusate sodium  100 mg Oral BID  . feeding supplement (ENSURE COMPLETE)  237 mL Oral BID BM  . levofloxacin (LEVAQUIN) IV  500 mg Intravenous Q48H  . levothyroxine  50 mcg Oral QAC breakfast  . sodium chloride  10-40 mL Intracatheter Q12H  . Warfarin - Pharmacist Dosing Inpatient   Does not apply q1800    OBJECTIVE  Filed Vitals:   05/15/14 2100 05/15/14 2348 05/16/14 0000 05/16/14 0347  BP: 138/59 125/59 103/63 119/71  Pulse: 64 62 72 80  Temp:  97.3 F (36.3 C)  97.3 F (36.3 C)  TempSrc:  Oral  Oral  Resp: 22 22 15 21   Height:      Weight:    159 lb 2.8 oz (72.2 kg)  SpO2: 96% 93% 92% 94%    Intake/Output Summary (Last 24 hours) at 05/16/14 0850 Last data filed at 05/16/14 0800  Gross per 24 hour  Intake 365.67 ml  Output   1450 ml  Net -1084.33 ml   Filed Weights   05/12/14 0430 05/12/14 0611 05/16/14 0347  Weight: 150 lb 9.2 oz (68.3 kg) 149 lb 11.1 oz (67.9 kg) 159 lb 2.8 oz (72.2 kg)    PHYSICAL EXAM  General: Pleasant, NAD. Neuro: Alert and oriented X 3. Moves all extremities spontaneously. Psych: Normal affect. HEENT:  Normal  Neck: Supple without bruits or JVD. Lungs:  Resp regular and unlabored, CTA. Heart: RRR no s3, s4. Grade 2/6 apical systolic murmur Abdomen: Soft,  non-tender, non-distended, BS + x 4.  Extremities: No clubbing, cyanosis or edema. DP/PT/Radials 2+ and equal bilaterally.  Accessory Clinical Findings  CBC  Recent Labs  05/13/14 1920  05/15/14 0656 05/16/14 0530  WBC 11.6*  < > 13.2* 12.4*  NEUTROABS 10.8*  --   --   --   HGB 9.6*  < > 9.0* 10.2*  HCT 27.6*  < > 26.9* 30.5*  MCV 79.8  < > 80.1 80.9  PLT 159  < > 188 234  < > = values in this interval not displayed. Basic Metabolic Panel  Recent Labs  05/14/14 0356 05/15/14 0500 05/16/14 0530  NA 126* 133* 134*  K 4.0 3.9 4.3  CL 88* 95* 94*  CO2 17* 18* 20  GLUCOSE 110* 145* 92  BUN 95* 97* 98*  CREATININE 3.83* 3.52* 3.37*  CALCIUM 8.3* 7.9* 8.6  MG 2.5 2.3  --    Liver Function Tests  Recent Labs  05/15/14 0500 05/16/14 0530  AST 131* 112*  ALT 140* 144*  ALKPHOS 241* 272*  BILITOT 0.8 0.9  PROT 5.9* 6.4  ALBUMIN 2.2* 2.5*   No results for input(s): LIPASE, AMYLASE in the last 72 hours. Cardiac Enzymes No results for input(s): CKTOTAL, CKMB, CKMBINDEX,  TROPONINI in the last 72 hours. BNP Invalid input(s): POCBNP D-Dimer No results for input(s): DDIMER in the last 72 hours. Hemoglobin A1C No results for input(s): HGBA1C in the last 72 hours. Fasting Lipid Panel No results for input(s): CHOL, HDL, LDLCALC, TRIG, CHOLHDL, LDLDIRECT in the last 72 hours. Thyroid Function Tests No results for input(s): TSH, T4TOTAL, T3FREE, THYROIDAB in the last 72 hours.  Invalid input(s): FREET3  TELE  NSR. PVCs  ECG    Radiology/Studies  Ct Abdomen Pelvis Wo Contrast  05/10/2014   CLINICAL DATA:  Right lower quadrant pain for 5 days  EXAM: CT ABDOMEN AND PELVIS WITHOUT CONTRAST  TECHNIQUE: Multidetector CT imaging of the abdomen and pelvis was performed following the standard protocol without IV contrast.  COMPARISON:  None.  FINDINGS: Lung bases show of the left lung base be within normal limits. Right lower lobe pneumonia with associated effusion is  seen.  The liver, gallbladder, spleen, adrenal glands and pancreas are within normal limits. The kidneys are well visualized bilaterally and demonstrate renal cystic change. Some of these have attenuation greater than normal fluid and likely represent hemorrhagic cysts. No renal calculi or obstructive changes are seen. Changes consistent with aortobifemoral bypass graft are noted. The appendix is within normal limits. No definitive inflammatory changes are seen.  Bladder is partially distended. Free fluid is noted within the pelvis although no cause towards factors are seen. Small fluid containing left inguinal hernia is noted. Bony structures are within normal limits.  IMPRESSION: Right lower lobe pneumonia with associated effusion.  Free fluid within the pelvis of uncertain etiology.  Chronic changes as described.   Electronically Signed   By: Alcide CleverMark  Lukens M.D.   On: 05/10/2014 13:35   X-ray Chest Pa And Lateral  05/11/2014   CLINICAL DATA:  Pneumonia  EXAM: CHEST  2 VIEW  COMPARISON:  05/10/2014  FINDINGS: Cardiomediastinal silhouette is stable. Persistent small right pleural effusion with right lower lobe infiltrate/pneumonia. Status post median sternotomy. Central mild bronchitic changes.  IMPRESSION: Persistent small right pleural effusion with right lower lobe infiltrate/ pneumonia. No convincing pulmonary edema. Central mild bronchitic changes.   Electronically Signed   By: Natasha MeadLiviu  Pop M.D.   On: 05/11/2014 11:31   Dg Chest Port 1 View  05/10/2014   CLINICAL DATA:  Weakness.  Shortness of breath for 3 days.  Fatigue.  EXAM: PORTABLE CHEST - 1 VIEW  COMPARISON:  Two-view chest 06/05/2013.  FINDINGS: The heart is mildly enlarged. And chronic interstitial changes are similar to the prior study. This likely reflects some element of edema superimposed on chronic change. There is persistent blunting of the right costophrenic angle suggesting a small effusion or chronic pleural thickening. No focal airspace  consolidation is evident.  IMPRESSION: 1. Cardiomegaly with mild edema superimposed on chronic interstitial coarsening. This likely reflects mild congestive heart failure. 2. Suspect small right pleural effusion.   Electronically Signed   By: Gennette Pachris  Mattern M.D.   On: 05/10/2014 10:26    ASSESSMENT AND PLAN 1. Acute on chronic systolic congestive heart failure/ICM. Echocardiogram on 06/06/13 showed ejection fraction 20-25% with mild mitral regurgitation and moderate aortic stenosis. The patient continues off diuretics at the present time.    2. Demand ischemia. Troponins trending down. Peal 3.63--> 2.96  3. Pneumonia/sepsis on broad-spectrum antibiotics.  4. Chronic warfarin anticoagulation for severe vascular disease -- INR now therapeutic after Vitamin K yesterday.  5. Acute on chronic kidney injury. BUN still rising slightly but creatinine is improving. No  evidence for internal bleeding. Hemoglobin improved.  6. Low blood pressure- not on any antihypertensives. This is improving.  Plan: Agree with transfer to telemetry today. Slow overall improvement. Signed, Cassell Clementhomas Kooper Chriswell MD

## 2014-05-16 NOTE — Progress Notes (Signed)
Patient has arrived to unit via wheelchair [facility]. Patient alert and oriented. Patient does not have any complaints at this time. Patient appears to be in no distress. Patient placed on cardiac monitor. CCMD notified. Patients belongings placed in cabinet in room 3E15.

## 2014-05-16 NOTE — Progress Notes (Signed)
[facility]  Clinton [name] ZOX:[id number] DOB: 1941-06-04 DOA: 05/10/2014 PCP: Arlan Organ., MD  Brief narrative: 72 year old gentleman with extensive medical history including coronary artery disease status post CABG, ischemic cardiomyopathy, and chronic systolic congestive heart failure with an ejection fraction of 20-25% per echocardiogram from 06/06/2013 who was admitted to the Medicine Service on 05/10/2014 after presenting with complaints of shortness of breath, cough and reported steep functional decline for the previous 24-48 hours.   Initial workup revealed presence of right lower lobe pneumonia seen on CT scan. He was also found to be in acute on chronic renal failure, and had elevated troponin initially of 0.56. His BNP was also elevated at 63,888. Case was discussed with cardiology given his extensive cardiac history and presence of acute on chronic systolic congestive heart failure. He was admitted to the step down unit and started on broad-spectrum IV antibiotic therapy with vancomycin and cefepime. Cardiology started IV diuresis. His troponin peaked at 3.63. He did not complain of chest pain.  As of 11/19 blood pressure has rebounded and therefore IV fluids decreased to keep open. INR has increased to greater than 6 and patient with some hemoptysis. Has been given a dose of vitamin K . No other signs of bleeding observed. Renal function and LFTs slowly trending downward. Also reporting issues with no bowel movement for many days so medications initiated.  HPI/Subjective: Still with hemoptysis but less than on 11/19; had large no bloody/non melanotic BM 11/19 pm  Assessment/Plan:  Sepsis w/ RLL CAP -Symptoms at admission of RR 28, white count of 14,800, lactate 3.04 with source of infection community-acquired pneumonia -cont cefepime D# 7 -Blood cultures obtained on 05/10/2014: no growth to date -CT abd/pelvis with incidental finding  of RLL PNA -Repeat chest x-ray on 05/11/2014 showed persistent small right pleural effusion with right lower lobe infiltrate/pneumonia -Continue supportive care  Acute on chronic systolic congestive heart failure. -known EF of 20-25% per transthoracic echocardiogram performed on 06/06/2013. -aggressively diuresed with Lasix gtt but SCr up to 3.5 11/16 so Cards transitioned to BID IV Lasix -CHF has remained compensated despite IVFs: not hypoxic, no LE edema and progressive renal failure  -recently orthostatic (see below) so Lasix was dc'd 11/17 and remains on hold as of 11/20  Demand ischemia -Patient presented with sepsis, troponins peaked at 3.63 with current trend down -did not have CP -Cardiology following  Coagulopathy/chronic anticoagulation for PVD  -Presented with supratherapeutic INR of 8.53, administered vitamin K and INR decreased to 1.7and then up to 6.0.  Today ~ 2.0 -Pharmacy managing Coumadin -Coumadin on hold -Hgb improved to 10.2 today  Hypotension with orthostasis -Due to volume depletion -now resolved -Responded to discontinuation of Lasix, holding antihypertensive medications, and trial of IV fluids with when necessary boluses  Acute on chronic renal failure/pre renal/volume depletion. -baseline renal function 35/1.4 -pt presented with ARF: 73/2.86 but felt to be due to decompensated CHF and resultant low renal perfusion  -11/16 Scr up to 3.4 so Lasix gtt stopped -11/17 BUN 93 and Scr 3.48 with noted orthostasis -11/19 blood pressure up -11/20 BUN remains elevated but Scr slowly trending down so will cont to hold Lasix  Transaminitis -Likely due to shock liver syndrome in setting of hypotension and low cardiac output from severe chronic systolic heart failure -hold statin until LFTs recover -current trend is slowly downward -bilirubin normal   Constipation -Milk of magnesia 1 11/19 with results -cont twice a  day Colace -Provide when necessary Dulcolax  suppository   Physical deconditioning PT rec SNF at dc  DVT prophylaxis: Coumadin-currently on hold due to supratherapeutic INR Code Status: Full Family Communication: No family at bedside Disposition Plan/Expected LOS: Transfer to telemetry-PT recommends SNF when medically stable  Consultants: Cardiology/Dr. Patty SermonsBrackbill  Procedures: None  Antibiotics: Cefepime 11/14 > Vancomycin 11/14 > 11/18  Objective: Blood pressure 112/58, pulse 81, temperature 97.4 F (36.3 C), temperature source Oral, resp. rate 20, height 5\' 11"  (1.803 m), weight 150 lb 6.4 oz (68.221 kg), SpO2 98 %.  Intake/Output Summary (Last 24 hours) at 05/16/14 1236 Last data filed at 05/16/14 0900  Gross per 24 hour  Intake    420 ml  Output   1300 ml  Net   -880 ml   Exam: Gen: No acute respiratory distress -cough w/ hemoptysis but better than 11/19 Chest: Coarse to auscultation bilaterally - no wheezes or Rh-room air  Cardiac: Regular rate and rhythm, S1-S2, no rubs murmurs or gallops, no peripheral edema, no JVD Abdomen: Soft nontender nondistended without obvious hepatosplenomegaly, no ascites Extremities: Symmetrical in appearance without cyanosis, clubbing or effusion  Scheduled Meds:  Scheduled Meds: . dextromethorphan-guaiFENesin  1 tablet Oral BID  . docusate sodium  100 mg Oral BID  . feeding supplement (ENSURE COMPLETE)  237 mL Oral BID BM  . levofloxacin (LEVAQUIN) IV  500 mg Intravenous Q48H  . levothyroxine  50 mcg Oral QAC breakfast  . sodium chloride  10-40 mL Intracatheter Q12H  . warfarin  2 mg Oral ONCE-1800  . Warfarin - Pharmacist Dosing Inpatient   Does not apply q1800    Data Reviewed: Basic Metabolic Panel:  Recent Labs Lab 05/12/14 0335 05/13/14 0740 05/14/14 0356 05/15/14 0500 05/16/14 0530  NA 126* 126* 126* 133* 134*  K 3.9 3.3* 4.0 3.9 4.3  CL 89* 86* 88* 95* 94*  CO2 16* 20 17* 18* 20  GLUCOSE 91 102* 110* 145* 92  BUN 92* 93* 95* 97* 98*  CREATININE 3.40*  3.48* 3.83* 3.52* 3.37*  CALCIUM 7.9* 8.0* 8.3* 7.9* 8.6  MG  --  2.3 2.5 2.3  --    Liver Function Tests:  Recent Labs Lab 05/13/14 0740 05/14/14 0356 05/15/14 0500 05/16/14 0530  AST 145*  --  131* 112*  ALT 148*  --  140* 144*  ALKPHOS 179*  --  241* 272*  BILITOT 1.3*  --  0.8 0.9  PROT 6.2  --  5.9* 6.4  ALBUMIN 2.5* 2.6* 2.2* 2.5*   CBC:  Recent Labs Lab 05/13/14 0740 05/13/14 1920 05/14/14 0356 05/15/14 0656 05/16/14 0530  WBC 13.4* 11.6* 11.6* 13.2* 12.4*  NEUTROABS 12.5* 10.8*  --   --   --   HGB 10.0* 9.6* 10.3* 9.0* 10.2*  HCT 29.1* 27.6* 30.9* 26.9* 30.5*  MCV 80.6 79.8 81.1 80.1 80.9  PLT 156 159 171 188 234   Cardiac Enzymes:  Recent Labs Lab 05/10/14 1910 05/10/14 2310 05/11/14 0454  TROPONINI 2.15* 3.63* 2.96*   BNP (last 3 results)  Recent Labs  06/05/13 0838 07/29/13 0818 05/10/14 0935  PROBNP 17824.0* 3171.0* 21308.663888.0*    Recent Results (from the past 240 hour(s))  Urine culture     Status: None   Collection Time: 05/10/14 10:03 AM  Result Value Ref Range Status   Specimen Description URINE, CLEAN CATCH  Final   Special Requests NONE  Final   Culture  Setup Time   Final    05/10/2014 20:50 Performed  at [facility]    Colony Count NO GROWTH [facility]formed at [facility]   Final   Culture NO GROWTH Performed at [facility]   Final   Report Status 05/11/2014 FINAL  Final  Culture, blood (routine x 2)     Status: None (Preliminary result)   Collection Time: 05/10/14  7:05 PM  Result Value Ref Range Status   Specimen Description BLOOD LEFT FOREARM  Final   Special Requests BOTTLES DRAWN AEROBIC ONLY 4CC  Final   Culture  Setup Time   Final    05/11/2014 01:38 Performed at [facility]    Culture   Final           BLOOD CULTURE RECEIVED NO GROWTH TO DATE CULTURE WILL BE HELD FOR 5 DAYS BEFORE ISSUING A FINAL NEGATIVE REPORT Performed at [facility]    Report Status PENDING   Incomplete  Culture, blood (routine x 2)     Status: None (Preliminary result)   Collection Time: 05/10/14  7:10 PM  Result Value Ref Range Status   Specimen Description BLOOD RIGHT HAND  Final   Special Requests BOTTLES DRAWN AEROBIC ONLY 5CC  Final   Culture  Setup Time   Final    05/11/2014 01:38 Performed at [facility]    Culture   Final           BLOOD CULTURE RECEIVED NO GROWTH TO DATE CULTURE WILL BE HELD FOR 5 DAYS BEFORE ISSUING A FINAL NEGATIVE REPORT Performed at [facility]    Report Status PENDING  Incomplete     Studies:  Recent x-ray studies have been reviewed in detail by the Attending Physician  Time spent :  40 mins  Junious Silkllison Ellis, ANP [facility] Office  [phone] Pager [phone]  On-Call/Text Page:      [email or web address]      password [email or web address]  If 7PM-7AM, please contact night-coverage [email or web address] Password [email or web address] 05/16/2014, 12:36 PM   LOS: 6 days    Examined patient and discussed assessment and plan with ANP Revonda StandardAllison and agree with above. Patient with multiple complex medical problems > 30 minutes spent on direct patient care  Debe CoderMULLEN, Ramesh Moan, MD

## 2014-05-16 NOTE — Progress Notes (Addendum)
ANTICOAGULATION CONSULT NOTE - Follow Up Consult  Pharmacy Consult for Warfarin  Indication: Hx CVA/severe PAD   Allergies  Allergen Reactions  . Codeine Phosphate Nausea And Vomiting    Patient Measurements: Height: 5\' 11"  (180.3 cm) Weight: 159 lb 2.8 oz (72.2 kg) IBW/kg (Calculated) : 75.3  Vital Signs: Temp: 97.2 F (36.2 C) (11/20 0800) Temp Source: Oral (11/20 0800) BP: 119/71 mmHg (11/20 0347) Pulse Rate: 80 (11/20 0347)  Labs:  Recent Labs  05/14/14 0356 05/15/14 0500 05/15/14 0656 05/16/14 0530  HGB 10.3*  --  9.0* 10.2*  HCT 30.9*  --  26.9* 30.5*  PLT 171  --  188 234  LABPROT 43.8*  --  58.2* 24.9*  INR 4.60*  --  6.61* 2.23*  CREATININE 3.83* 3.52*  --  3.37*    Estimated Creatinine Clearance: 19.6 mL/min (by C-G formula based on Cr of 3.37).   Assessment: [age] YOM admitted on 11/14 with a SUPRAtherapeutic INR of 8.53 on PTA dose of 6 mg daily EXCEPT for 3 mg on Tues/Sat. The INR was reversed and resumed on 11/15 with a heparin bridge for hx CVA/severe PAD. Heparin bridge was stopped on 11/17 due to an elevated INR. INR today is 2.23 following PO vitamin K on 11/19. Pt is on levofloxacin dosed q48h and levothyroxine, which could both play a role in increasing INR. Will retrial warfarin at lower dose tonight.  Goal of Therapy:  INR 2-3 Vancomycin trough of 15-20 mcg/ml   Plan:  -Warfarin 2mg  PO tonight x1 -Daily PT/INR  Arlean Hoppingorey M. Newman PiesBall, PharmD Clinical Pharmacist Pager [phone] 05/16/2014 11:14 AM

## 2014-05-17 DIAGNOSIS — I25700 Atherosclerosis of coronary artery bypass graft(s), unspecified, with unstable angina pectoris: Secondary | ICD-10-CM

## 2014-05-17 DIAGNOSIS — I5023 Acute on chronic systolic (congestive) heart failure: Secondary | ICD-10-CM | POA: Insufficient documentation

## 2014-05-17 LAB — CBC
HCT: 31 % — ABNORMAL LOW (ref 39.0–52.0)
Hemoglobin: 10.2 g/dL — ABNORMAL LOW (ref 13.0–17.0)
MCH: 27 pg (ref 26.0–34.0)
MCHC: 32.9 g/dL (ref 30.0–36.0)
MCV: 82 fL (ref 78.0–100.0)
PLATELETS: 201 10*3/uL (ref 150–400)
RBC: 3.78 MIL/uL — ABNORMAL LOW (ref 4.22–5.81)
RDW: 15.8 % — AB (ref 11.5–15.5)
WBC: 11 10*3/uL — AB (ref 4.0–10.5)

## 2014-05-17 LAB — COMPREHENSIVE METABOLIC PANEL
ALT: 116 U/L — AB (ref 0–53)
AST: 67 U/L — AB (ref 0–37)
Albumin: 2.3 g/dL — ABNORMAL LOW (ref 3.5–5.2)
Alkaline Phosphatase: 261 U/L — ABNORMAL HIGH (ref 39–117)
Anion gap: 18 — ABNORMAL HIGH (ref 5–15)
BUN: 95 mg/dL — ABNORMAL HIGH (ref 6–23)
CALCIUM: 8.3 mg/dL — AB (ref 8.4–10.5)
CO2: 20 mEq/L (ref 19–32)
Chloride: 95 mEq/L — ABNORMAL LOW (ref 96–112)
Creatinine, Ser: 3.12 mg/dL — ABNORMAL HIGH (ref 0.50–1.35)
GFR calc Af Amer: 21 mL/min — ABNORMAL LOW (ref >90)
GFR calc non Af Amer: 18 mL/min — ABNORMAL LOW (ref >90)
Glucose, Bld: 76 mg/dL (ref 70–99)
Potassium: 4.5 mEq/L (ref 3.7–5.3)
SODIUM: 133 meq/L — AB (ref 137–147)
TOTAL PROTEIN: 6.1 g/dL (ref 6.0–8.3)
Total Bilirubin: 0.9 mg/dL (ref 0.3–1.2)

## 2014-05-17 LAB — CULTURE, BLOOD (ROUTINE X 2)
CULTURE: NO GROWTH
Culture: NO GROWTH

## 2014-05-17 LAB — PROTIME-INR
INR: 1.89 — AB (ref 0.00–1.49)
Prothrombin Time: 21.9 seconds — ABNORMAL HIGH (ref 11.6–15.2)

## 2014-05-17 MED ORDER — WARFARIN SODIUM 3 MG PO TABS
3.0000 mg | ORAL_TABLET | Freq: Once | ORAL | Status: AC
  Administered 2014-05-17: 3 mg via ORAL
  Filled 2014-05-17: qty 1

## 2014-05-17 NOTE — Progress Notes (Signed)
ANTICOAGULATION CONSULT NOTE - Follow Up Consult  Pharmacy Consult for Warfarin  Indication: Hx CVA/severe PAD   Allergies  Allergen Reactions  . Codeine Phosphate Nausea And Vomiting    Patient Measurements: Height: 5\' 11"  (180.3 cm) Weight: 149 lb 14.4 oz (67.994 kg) (Scale C) IBW/kg (Calculated) : 75.3  Vital Signs: Temp: 97.6 F (36.4 C) (11/21 1400) Temp Source: Oral (11/21 1400) BP: 118/96 mmHg (11/21 1400) Pulse Rate: 66 (11/21 1400)  Labs:  Recent Labs  05/15/14 0500  05/15/14 0656 05/16/14 0530 05/17/14 0425  HGB  --   < > 9.0* 10.2* 10.2*  HCT  --   --  26.9* 30.5* 31.0*  PLT  --   --  188 234 201  LABPROT  --   --  58.2* 24.9* 21.9*  INR  --   --  6.61* 2.23* 1.89*  CREATININE 3.52*  --   --  3.37* 3.12*  < > = values in this interval not displayed.  Estimated Creatinine Clearance: 20 mL/min (by C-G formula based on Cr of 3.12).   Assessment: [age] YOM admitted on 11/14 with a SUPRAtherapeutic INR of 8.53 on PTA dose of 6 mg daily EXCEPT for 3 mg on Tues/Sat. The INR was reversed and resumed on 11/15 with a heparin bridge for hx CVA/severe PAD. Heparin bridge was stopped on 11/17 due to an elevated INR. INR today is 2.23 following PO vitamin K on 11/19. Pt is on levofloxacin dosed q48h and levothyroxine, which could both play a role in increasing INR. Will retrial warfarin at lower dose tonight.  This morning's INR is therapeutic at 1.89. Patient's weekly warfarin dose prior to admission was 36mg /wk. Will likely need decreased weekly dose by 10-20%.   Goal of Therapy:  INR 2-3 Vancomycin trough of 15-20 mcg/ml   Plan:  -Warfarin 3mg  PO tonight x1 -Daily PT/INR  Arlean Hoppingorey M. Newman PiesBall, PharmD Clinical Pharmacist Pager [phone] 05/17/2014 3:21 PM

## 2014-05-17 NOTE — Progress Notes (Signed)
    Subjective:  Sitting in chair, no significant SOB.   Objective:  Vital Signs in the last 24 hours: Temp:  [97.3 F (36.3 C)-97.4 F (36.3 C)] 97.4 F (36.3 C) (11/21 0900) Pulse Rate:  [70-88] 88 (11/21 0900) Resp:  [18-20] 18 (11/21 0900) BP: (99-121)/(51-82) 99/61 mmHg (11/21 0900) SpO2:  [90 %-100 %] 90 % (11/21 0900) Weight:  [149 lb 14.4 oz (67.994 kg)] 149 lb 14.4 oz (67.994 kg) (11/21 0517)  Intake/Output from previous day: 11/20 0701 - 11/21 0700 In: 1710 [P.O.:1680; I.V.:30] Out: 725 [Urine:725]   Physical Exam: General: Pleasant, NAD. Neuro: Alert and oriented X 3. Moves all extremities spontaneously. Psych: Normal affect. HEENT: Normal Neck: Supple without bruits or JVD. Lungs: Resp regular and unlabored, CTA. Heart: RRR no s3, s4. Grade 2/6 apical systolic murmur Abdomen: Soft, non-tender, non-distended, BS + x 4.  Extremities: No clubbing, cyanosis or edema. DP/PT/Radials 2+ and equal bilaterally.    Lab Results:  Recent Labs  05/16/14 0530 05/17/14 0425  WBC 12.4* 11.0*  HGB 10.2* 10.2*  PLT 234 201    Recent Labs  05/16/14 0530 05/17/14 0425  NA 134* 133*  K 4.3 4.5  CL 94* 95*  CO2 20 20  GLUCOSE 92 76  BUN 98* 95*  CREATININE 3.37* 3.12*    Recent Labs  05/17/14 0425  PROT 6.1  ALBUMIN 2.3*  AST 67*  ALT 116*  ALKPHOS 261*  BILITOT 0.9   Telemetry: NSR, PVC Personally viewed.  Cardiac Studies:  EF 25%, at least moderate AS  Assessment/Plan:   1. Acute on chronic systolic congestive heart failure/ICM. Echocardiogram on 06/06/13 showed ejection fraction 20-25% with mild mitral regurgitation and moderate aortic stenosis. The patient continues off diuretics at the present time.  2. Demand ischemia. Troponins trending down. Peal 3.63--> 2.96  3.Pneumonia/sepsis on broad-spectrum antibiotics.  4.Chronic warfarin anticoagulation for severe vascular disease -- INR now therapeutic after Vitamin K.  5. Acute  on chronic kidney injury. BUN still rising slightly but creatinine is improving. No evidence for internal bleeding. Hemoglobin improved.  6. Low blood pressure- not on any antihypertensives. This is improving.  SKAINS, MARK 05/17/2014, 12:21 PM

## 2014-05-17 NOTE — Progress Notes (Addendum)
[facility] Progress Note   Clinton [name]:[id number] DOB: Nov 25, 1940 DOA: 05/10/2014 PCP: Arlan OrganMANNING, JAMES S., MD  Brief narrative: Clinton HeadsCharles W Crafts is a 73 y.o. male with a past medical history of coronary artery disease status post CABG, ischemic cardiomyopathy, chronic systolic heart failure with an EF of 20-25% admitted to the hospital with shortness of breath and cough. He was diagnosed with a right lower lobe pneumonia, acute on chronic renal failure and noted to have a mildly elevated troponin of 0.56. It was suspected he had acute on chronic heart failure as well and was admitted to the step down unit for further management. He was treated with IV antibiotics and IV diuretics. Troponins were noted to peak at 3.63.   Subjective: Cough and hemoptysis are improving. No dyspnea. No other complaints. Has been able to ambulate in the hall today.   Assessment/Plan: Principal Problem:   CAP / sepsis -Right lower lobe pneumonia with improving hemoptysis -Cefepime initiated on 11/14 and transitioned to levofloxacin on 11/18  Active Problems:    Acute on chronic systolic CHF (congestive heart failure), NYHA class 4 -EF 20-25%-has been diuresed sufficiently and diuretics have been discontinued -Cardiology has been assisting with management  Coagulopathy - Has been on Coumadin apparently for peripheral vascular disease -INR was elevated at 8.53-the patient was given vitamin K-INR steadily improving and today is less than 2 therefore, Coumadin has been resumed    AKI/ Orthostatic hypotension  -Due to diuresis and slowly improving after holding diuretics    Demand ischemia -Max troponin was 3.63 and is now improving    Transaminitis -Suspecting shock liver due to sepsis, hypotension and decreased cardiac output secondary to heart failure -Statin is on hold    Essential hypertension -Antihypertensives on hold    CAD, ARTERY BYPASS GRAFT    Peripheral vascular  disease -Coumadin resumed  Code Status: Full code Family Communication:  Disposition Plan: To skilled nursing facility for rehabilitation DVT prophylaxis: Coumadin  Consultants: Cardiology  Procedures: None   Antibiotics: Anti-infectives    Start     Dose/Rate Route Frequency Ordered Stop   05/16/14 1800  levofloxacin (LEVAQUIN) IVPB 500 mg     500 mg100 mL/hr over 60 Minutes Intravenous Every 48 hours 05/14/14 1905     05/15/14 2200  vancomycin (VANCOCIN) IVPB 1000 mg/200 mL premix  Status:  Discontinued     1,000 mg200 mL/hr over 60 Minutes Intravenous Every 48 hours 05/14/14 1106 05/14/14 1840   05/14/14 1915  levofloxacin (LEVAQUIN) IVPB 750 mg     750 mg100 mL/hr over 90 Minutes Intravenous  Once 05/14/14 1905 05/14/14 2149   05/12/14 1100  vancomycin (VANCOCIN) IVPB 750 mg/150 ml premix     750 mg150 mL/hr over 60 Minutes Intravenous Every 24 hours 05/11/14 1038 05/14/14 1211   05/11/14 1500  vancomycin (VANCOCIN) IVPB 750 mg/150 ml premix  Status:  Discontinued     750 mg150 mL/hr over 60 Minutes Intravenous Every 24 hours 05/10/14 1430 05/10/14 1655   05/11/14 1045  vancomycin (VANCOCIN) IVPB 1000 mg/200 mL premix     1,000 mg200 mL/hr over 60 Minutes Intravenous NOW 05/11/14 1038 05/11/14 1602   05/10/14 1700  ceFEPIme (MAXIPIME) 1 g in dextrose 5 % 50 mL IVPB  Status:  Discontinued     1 g100 mL/hr over 30 Minutes Intravenous Every 24 hours 05/10/14 1641 05/14/14 1845   05/10/14 1430  vancomycin (VANCOCIN) IVPB 1000 mg/200 mL premix  Status:  Discontinued  1,000 mg200 mL/hr over 60 Minutes Intravenous NOW 05/10/14 1430 05/10/14 1655   05/10/14 1400  cefTRIAXone (ROCEPHIN) 1 g in dextrose 5 % 50 mL IVPB     1 g100 mL/hr over 30 Minutes Intravenous  Once 05/10/14 1356 05/10/14 1432   05/10/14 1400  azithromycin (ZITHROMAX) 500 mg in dextrose 5 % 250 mL IVPB     500 mg250 mL/hr over 60 Minutes Intravenous  Once 05/10/14 1356 05/10/14 1629         Objective: Filed  Weights   05/16/14 0347 05/16/14 1111 05/17/14 0517  Weight: 72.2 kg (159 lb 2.8 oz) 68.221 kg (150 lb 6.4 oz) 67.994 kg (149 lb 14.4 oz)    Intake/Output Summary (Last 24 hours) at 05/17/14 1249 Last data filed at 05/17/14 0600  Gross per 24 hour  Intake   1560 ml  Output    600 ml  Net    960 ml     Vitals Filed Vitals:   05/16/14 2122 05/17/14 0205 05/17/14 0517 05/17/14 0900  BP: 121/51 118/60 121/82 99/61  Pulse: 70 72 71 88  Temp: 97.4 F (36.3 C) 97.4 F (36.3 C) 97.4 F (36.3 C) 97.4 F (36.3 C)  TempSrc: Oral Oral Oral Oral  Resp: 18 18 18 18   Height:      Weight:   67.994 kg (149 lb 14.4 oz)   SpO2: 92% 100% 92% 90%    Exam: General: Awake alert oriented 3, No acute distress Lungs: Crackles in right lower lobe Cardiovascular: Regular rate and rhythm without murmur gallop or rub normal S1 and S2 Abdomen: Nontender, nondistended, soft, bowel sounds positive, no rebound, no ascites, no appreciable mass Extremities: No significant cyanosis, clubbing, or edema bilateral lower extremities  Data Reviewed: Basic Metabolic Panel:  Recent Labs Lab 05/13/14 0740 05/14/14 0356 05/15/14 0500 05/16/14 0530 05/17/14 0425  NA 126* 126* 133* 134* 133*  K 3.3* 4.0 3.9 4.3 4.5  CL 86* 88* 95* 94* 95*  CO2 20 17* 18* 20 20  GLUCOSE 102* 110* 145* 92 76  BUN 93* 95* 97* 98* 95*  CREATININE 3.48* 3.83* 3.52* 3.37* 3.12*  CALCIUM 8.0* 8.3* 7.9* 8.6 8.3*  MG 2.3 2.5 2.3  --   --    Liver Function Tests:  Recent Labs Lab 05/13/14 0740 05/14/14 0356 05/15/14 0500 05/16/14 0530 05/17/14 0425  AST 145*  --  131* 112* 67*  ALT 148*  --  140* 144* 116*  ALKPHOS 179*  --  241* 272* 261*  BILITOT 1.3*  --  0.8 0.9 0.9  PROT 6.2  --  5.9* 6.4 6.1  ALBUMIN 2.5* 2.6* 2.2* 2.5* 2.3*   No results for input(s): LIPASE, AMYLASE in the last 168 hours. No results for input(s): AMMONIA in the last 168 hours. CBC:  Recent Labs Lab 05/13/14 0740 05/13/14 1920  05/14/14 0356 05/15/14 0656 05/16/14 0530 05/17/14 0425  WBC 13.4* 11.6* 11.6* 13.2* 12.4* 11.0*  NEUTROABS 12.5* 10.8*  --   --   --   --   HGB 10.0* 9.6* 10.3* 9.0* 10.2* 10.2*  HCT 29.1* 27.6* 30.9* 26.9* 30.5* 31.0*  MCV 80.6 79.8 81.1 80.1 80.9 82.0  PLT 156 159 171 188 234 201   Cardiac Enzymes:  Recent Labs Lab 05/10/14 1910 05/10/14 2310 05/11/14 0454  TROPONINI 2.15* 3.63* 2.96*   BNP (last 3 results)  Recent Labs  06/05/13 0838 07/29/13 0818 05/10/14 0935  PROBNP 17824.0* 3171.0* 63888.0*   CBG: No results for input(s):  GLUCAP in the last 168 hours.  Recent Results (from the past 240 hour(s))  Urine culture     Status: None   Collection Time: 05/10/14 10:03 AM  Result Value Ref Range Status   Specimen Description URINE, CLEAN CATCH  Final   Special Requests NONE  Final   Culture  Setup Time   Final    05/10/2014 20:50 Performed at [facility]    Colony Count NO GROWTH Performed at [facility]   Final   Culture NO GROWTH Performed at [facility]   Final   Report Status 05/11/2014 FINAL  Final  Culture, blood (routine x 2)     Status: None (Preliminary result)   Collection Time: 05/10/14  7:05 PM  Result Value Ref Range Status   Specimen Description BLOOD LEFT FOREARM  Final   Special Requests BOTTLES DRAWN AEROBIC ONLY 4CC  Final   Culture  Setup Time   Final    05/11/2014 01:38 Performed at [facility]    Culture   Final           BLOOD CULTURE RECEIVED NO GROWTH TO DATE CULTURE WILL BE HELD FOR 5 DAYS BEFORE ISSUING A FINAL NEGATIVE REPORT Performed at [facility]    Report Status PENDING  Incomplete  Culture, blood (routine x 2)     Status: None (Preliminary result)   Collection Time: 05/10/14  7:10 PM  Result Value Ref Range Status   Specimen Description BLOOD RIGHT HAND  Final   Special Requests BOTTLES DRAWN AEROBIC ONLY 5CC  Final   Culture  Setup Time   Final    05/11/2014  01:38 Performed at [facility]    Culture   Final           BLOOD CULTURE RECEIVED NO GROWTH TO DATE CULTURE WILL BE HELD FOR 5 DAYS BEFORE ISSUING A FINAL NEGATIVE REPORT Performed at [facility]    Report Status PENDING  Incomplete     Studies:  Recent x-ray studies have been reviewed in detail by the Attending Physician  Scheduled Meds:  Scheduled Meds: . dextromethorphan-guaiFENesin  1 tablet Oral BID  . docusate sodium  100 mg Oral BID  . feeding supplement (ENSURE COMPLETE)  237 mL Oral BID BM  . levofloxacin (LEVAQUIN) IV  500 mg Intravenous Q48H  . levothyroxine  50 mcg Oral QAC breakfast  . sodium chloride  10-40 mL Intracatheter Q12H  . Warfarin - Pharmacist Dosing Inpatient   Does not apply q1800   Continuous Infusions: . sodium chloride 10 mL/hr at 05/16/14 1228    Time spent on care of this patient: 35 min   Kashlynn Kundert, MD 05/17/2014, 12:49 PM  LOS: 7 days   [facility] Office  [phone] Pager - Text Page per [email or web address]  If 7PM-7AM, please contact night-coverage [email or web address]

## 2014-05-18 LAB — BASIC METABOLIC PANEL
Anion gap: 18 — ABNORMAL HIGH (ref 5–15)
BUN: 88 mg/dL — AB (ref 6–23)
CO2: 20 mEq/L (ref 19–32)
Calcium: 8.2 mg/dL — ABNORMAL LOW (ref 8.4–10.5)
Chloride: 96 mEq/L (ref 96–112)
Creatinine, Ser: 2.98 mg/dL — ABNORMAL HIGH (ref 0.50–1.35)
GFR calc non Af Amer: 19 mL/min — ABNORMAL LOW (ref >90)
GFR, EST AFRICAN AMERICAN: 22 mL/min — AB (ref >90)
GLUCOSE: 94 mg/dL (ref 70–99)
POTASSIUM: 4.2 meq/L (ref 3.7–5.3)
Sodium: 134 mEq/L — ABNORMAL LOW (ref 137–147)

## 2014-05-18 LAB — CBC
HCT: 30.7 % — ABNORMAL LOW (ref 39.0–52.0)
Hemoglobin: 10.2 g/dL — ABNORMAL LOW (ref 13.0–17.0)
MCH: 27.2 pg (ref 26.0–34.0)
MCHC: 33.2 g/dL (ref 30.0–36.0)
MCV: 81.9 fL (ref 78.0–100.0)
Platelets: 186 10*3/uL (ref 150–400)
RBC: 3.75 MIL/uL — AB (ref 4.22–5.81)
RDW: 15.8 % — ABNORMAL HIGH (ref 11.5–15.5)
WBC: 13.9 10*3/uL — ABNORMAL HIGH (ref 4.0–10.5)

## 2014-05-18 LAB — PROTIME-INR
INR: 1.94 — ABNORMAL HIGH (ref 0.00–1.49)
PROTHROMBIN TIME: 22.3 s — AB (ref 11.6–15.2)

## 2014-05-18 NOTE — Progress Notes (Signed)
Patient is resting peacefully. Denies c/o pain/discomfort at present time. Maintained on telemetry and is in Accelerated Junctional rhythm with a heart of 81. Dr. Zachery ConchFriedman who is covering for cardiology made aware of rhythm. Will continue to monitor.  Sharlene Doryanya Cesare Sumlin, RN

## 2014-05-18 NOTE — Progress Notes (Signed)
Pt coughing up bloody sputum. Somewhat SOB after coughing. Sat 93-94%. O2 2l/Fordoche applied for comfort. Lungs with few rhonchi.

## 2014-05-18 NOTE — Progress Notes (Addendum)
ANTICOAGULATION CONSULT NOTE - Follow Up Consult  Pharmacy Consult for Warfarin  Indication: Hx CVA/severe PAD   Allergies  Allergen Reactions  . Codeine Phosphate Nausea And Vomiting    Patient Measurements: Height: 5\' 11"  (180.3 cm) Weight: 154 lb 15.7 oz (70.3 kg) IBW/kg (Calculated) : 75.3  Vital Signs: Temp: 98.2 F (36.8 C) (11/22 0900) Temp Source: Oral (11/22 0900) BP: 126/68 mmHg (11/22 0900) Pulse Rate: 82 (11/22 0900)  Labs:  Recent Labs  05/16/14 0530 05/17/14 0425 05/18/14 0458  HGB 10.2* 10.2* 10.2*  HCT 30.5* 31.0* 30.7*  PLT 234 201 186  LABPROT 24.9* 21.9* 22.3*  INR 2.23* 1.89* 1.94*  CREATININE 3.37* 3.12* 2.98*    Estimated Creatinine Clearance: 21.6 mL/min (by C-G formula based on Cr of 2.98).   Assessment: [age] YOM admitted on 11/14 with a SUPRAtherapeutic INR of 8.53 on PTA dose of 6 mg daily EXCEPT for 3 mg on Tues/Sat. The INR was reversed and resumed on 11/15 with a heparin bridge for hx CVA/severe PAD. Heparin bridge was stopped on 11/17 due to an elevated INR. INR today is 2.23 following PO vitamin K on 11/19. Pt is on levofloxacin dosed q48h and levothyroxine, which could both play a role in increasing INR.   Patient's weekly warfarin dose prior to admission was 36mg /wk. Will likely need decreased weekly dose by 10-20%.   This morning's INR is therapeutic at 1.94.   Goal of Therapy:  INR 2-3 Vancomycin trough of 15-20 mcg/ml   Plan:  -Warfarin 3mg  PO tonight x1 -Daily PT/INR -Monitor for s/sx of bleeding  Arlean Hoppingorey M. Newman PiesBall, PharmD Clinical Pharmacist Pager [phone] 05/18/2014 1:40 PM   ADDN: Pt is experiencing hemotysis and warfarin will be held for now per Dr. Butler Denmarkizwan.  Arlean Hoppingorey M. Newman PiesBall, PharmD Clinical Pharmacist Pager [phone]

## 2014-05-18 NOTE — Progress Notes (Signed)
[facility] Progress Note   [name]:[id number] DOB: 30-Dec-1940 DOA: 05/10/2014 PCP: Arlan Organ., MD  Brief narrative: Clinton [name] is a 73 y.o. male with a past medical history of coronary artery disease status post CABG, ischemic cardiomyopathy, chronic systolic heart failure with an EF of 20-25% admitted to the hospital with shortness of breath and cough. He was diagnosed with a right lower lobe pneumonia, acute on chronic renal failure and noted to have a mildly elevated troponin of 0.56. It was suspected he had acute on chronic heart failure as well and was admitted to the step down unit for further management. He was treated with IV antibiotics and IV diuretics. Troponins were noted to peak at 3.63.   Subjective: Continues to cough up blood. No other complaints.   Assessment/Plan: Principal Problem:   CAP / sepsis -Right lower lobe pneumonia with hemoptysis- see below in regards to discontinuing Coumadin -Cefepime initiated on 11/14 and transitioned to levofloxacin on 11/18  Active Problems:    Acute on chronic systolic CHF (congestive heart failure), NYHA class 4 -EF 20-25%-has been diuresed sufficiently and diuretics have been discontinued -Cardiology has been assisting with management  Coagulopathy - Has been on Coumadin apparently for peripheral vascular disease -INR was elevated at 8.53-the patient was given vitamin K-INR steadily improving and today is less than 2 therefore, - -Coumadin  resumed yesterday but hemoptysis continues and therefore will stop coumadin once again and will not resume until hemoptysis has resolved completely.     AKI/ Orthostatic hypotension  -Due to diuresis and slowly improving after holding diuretics    Demand ischemia -Max troponin was 3.63 and is now improving    Transaminitis -Suspecting shock liver due to sepsis, hypotension and decreased cardiac output secondary to heart failure -Statin is on hold   Essential hypertension -Antihypertensives on hold    CAD, ARTERY BYPASS GRAFT    Peripheral vascular disease -Coumadin to be held for now  Code Status: Full code Family Communication:  Disposition Plan: To skilled nursing facility for rehabilitation DVT prophylaxis: Coumadin to be held-  Will place SCDs  Consultants: Cardiology  Procedures: None   Antibiotics: Anti-infectives    Start     Dose/Rate Route Frequency Ordered Stop   05/16/14 1800  levofloxacin (LEVAQUIN) IVPB 500 mg     500 mg100 mL/hr over 60 Minutes Intravenous Every 48 hours 05/14/14 1905     05/15/14 2200  vancomycin (VANCOCIN) IVPB 1000 mg/200 mL premix  Status:  Discontinued     1,000 mg200 mL/hr over 60 Minutes Intravenous Every 48 hours 05/14/14 1106 05/14/14 1840   05/14/14 1915  levofloxacin (LEVAQUIN) IVPB 750 mg     750 mg100 mL/hr over 90 Minutes Intravenous  Once 05/14/14 1905 05/14/14 2149   05/12/14 1100  vancomycin (VANCOCIN) IVPB 750 mg/150 ml premix     750 mg150 mL/hr over 60 Minutes Intravenous Every 24 hours 05/11/14 1038 05/14/14 1211   05/11/14 1500  vancomycin (VANCOCIN) IVPB 750 mg/150 ml premix  Status:  Discontinued     750 mg150 mL/hr over 60 Minutes Intravenous Every 24 hours 05/10/14 1430 05/10/14 1655   05/11/14 1045  vancomycin (VANCOCIN) IVPB 1000 mg/200 mL premix     1,000 mg200 mL/hr over 60 Minutes Intravenous NOW 05/11/14 1038 05/11/14 1602   05/10/14 1700  ceFEPIme (MAXIPIME) 1 g in dextrose 5 % 50 mL IVPB  Status:  Discontinued     1 g100 mL/hr over 30 Minutes Intravenous Every 24  hours 05/10/14 1641 05/14/14 1845   05/10/14 1430  vancomycin (VANCOCIN) IVPB 1000 mg/200 mL premix  Status:  Discontinued     1,000 mg200 mL/hr over 60 Minutes Intravenous NOW 05/10/14 1430 05/10/14 1655   05/10/14 1400  cefTRIAXone (ROCEPHIN) 1 g in dextrose 5 % 50 mL IVPB     1 g100 mL/hr over 30 Minutes Intravenous  Once 05/10/14 1356 05/10/14 1432   05/10/14 1400  azithromycin (ZITHROMAX) 500  mg in dextrose 5 % 250 mL IVPB     500 mg250 mL/hr over 60 Minutes Intravenous  Once 05/10/14 1356 05/10/14 1629         Objective: Filed Weights   05/16/14 1111 05/17/14 0517 05/18/14 0641  Weight: 68.221 kg (150 lb 6.4 oz) 67.994 kg (149 lb 14.4 oz) 70.3 kg (154 lb 15.7 oz)    Intake/Output Summary (Last 24 hours) at 05/18/14 1323 Last data filed at 05/18/14 0900  Gross per 24 hour  Intake   1080 ml  Output   1350 ml  Net   -270 ml     Vitals Filed Vitals:   05/17/14 1400 05/17/14 2151 05/18/14 0641 05/18/14 0900  BP: 118/96 114/79 138/71 126/68  Pulse: 66 84 59 82  Temp: 97.6 F (36.4 C) 97.7 F (36.5 C) 97.4 F (36.3 C) 98.2 F (36.8 C)  TempSrc: Oral Oral Oral Oral  Resp: 18 18 18 18   Height:      Weight:   70.3 kg (154 lb 15.7 oz)   SpO2: 97% 97% 97% 100%    Exam: General: Awake alert oriented 3, No acute distress Lungs: Crackles in right lower lobe Cardiovascular: Regular rate and rhythm without murmur gallop or rub normal S1 and S2 Abdomen: Nontender, nondistended, soft, bowel sounds positive, no rebound, no ascites, no appreciable mass Extremities: No significant cyanosis, clubbing, or edema bilateral lower extremities  Data Reviewed: Basic Metabolic Panel:  Recent Labs Lab 05/13/14 0740 05/14/14 0356 05/15/14 0500 05/16/14 0530 05/17/14 0425 05/18/14 0458  NA 126* 126* 133* 134* 133* 134*  K 3.3* 4.0 3.9 4.3 4.5 4.2  CL 86* 88* 95* 94* 95* 96  CO2 20 17* 18* 20 20 20   GLUCOSE 102* 110* 145* 92 76 94  BUN 93* 95* 97* 98* 95* 88*  CREATININE 3.48* 3.83* 3.52* 3.37* 3.12* 2.98*  CALCIUM 8.0* 8.3* 7.9* 8.6 8.3* 8.2*  MG 2.3 2.5 2.3  --   --   --    Liver Function Tests:  Recent Labs Lab 05/13/14 0740 05/14/14 0356 05/15/14 0500 05/16/14 0530 05/17/14 0425  AST 145*  --  131* 112* 67*  ALT 148*  --  140* 144* 116*  ALKPHOS 179*  --  241* 272* 261*  BILITOT 1.3*  --  0.8 0.9 0.9  PROT 6.2  --  5.9* 6.4 6.1  ALBUMIN 2.5* 2.6* 2.2*  2.5* 2.3*   No results for input(s): LIPASE, AMYLASE in the last 168 hours. No results for input(s): AMMONIA in the last 168 hours. CBC:  Recent Labs Lab 05/13/14 0740 05/13/14 1920 05/14/14 0356 05/15/14 0656 05/16/14 0530 05/17/14 0425 05/18/14 0458  WBC 13.4* 11.6* 11.6* 13.2* 12.4* 11.0* 13.9*  NEUTROABS 12.5* 10.8*  --   --   --   --   --   HGB 10.0* 9.6* 10.3* 9.0* 10.2* 10.2* 10.2*  HCT 29.1* 27.6* 30.9* 26.9* 30.5* 31.0* 30.7*  MCV 80.6 79.8 81.1 80.1 80.9 82.0 81.9  PLT 156 159 171 188 234 201 186  Cardiac Enzymes: No results for input(s): CKTOTAL, CKMB, CKMBINDEX, TROPONINI in the last 168 hours. BNP (last 3 results)  Recent Labs  06/05/13 0838 07/29/13 0818 05/10/14 0935  PROBNP 17824.0* 3171.0* 63888.0*   CBG: No results for input(s): GLUCAP in the last 168 hours.  Recent Results (from the past 240 hour(s))  Urine culture     Status: None   Collection Time: 05/10/14 10:03 AM  Result Value Ref Range Status   Specimen Description URINE, CLEAN CATCH  Final   Special Requests NONE  Final   Culture  Setup Time   Final    05/10/2014 20:50 Performed at [facility]    Colony Count NO GROWTH Performed at [facility]   Final   Culture NO GROWTH Performed at [facility]   Final   Report Status 05/11/2014 FINAL  Final  Culture, blood (routine x 2)     Status: None   Collection Time: 05/10/14  7:05 PM  Result Value Ref Range Status   Specimen Description BLOOD LEFT FOREARM  Final   Special Requests BOTTLES DRAWN AEROBIC ONLY 4CC  Final   Culture  Setup Time   Final    05/11/2014 01:38 Performed at [facility]    Culture   Final    NO GROWTH 5 DAYS Performed at [facility]    Report Status 05/17/2014 FINAL  Final  Culture, blood (routine x 2)     Status: None   Collection Time: 05/10/14  7:10 PM  Result Value Ref Range Status   Specimen Description BLOOD RIGHT HAND  Final   Special Requests BOTTLES  DRAWN AEROBIC ONLY 5CC  Final   Culture  Setup Time   Final    05/11/2014 01:38 Performed at [facility]    Culture   Final    NO GROWTH 5 DAYS Performed at [facility]    Report Status 05/17/2014 FINAL  Final     Studies:  Recent x-ray studies have been reviewed in detail by the Attending Physician  Scheduled Meds:  Scheduled Meds: . dextromethorphan-guaiFENesin  1 tablet Oral BID  . docusate sodium  100 mg Oral BID  . feeding supplement (ENSURE COMPLETE)  237 mL Oral BID BM  . levofloxacin (LEVAQUIN) IV  500 mg Intravenous Q48H  . levothyroxine  50 mcg Oral QAC breakfast  . sodium chloride  10-40 mL Intracatheter Q12H   Continuous Infusions: . sodium chloride 10 mL/hr at 05/16/14 1228    Time spent on care of this patient: 35 min   Purnell Daigle, MD 05/18/2014, 1:23 PM  LOS: 8 days   [facility] Office  [phone] Pager - Text Page per [email or web address]  If 7PM-7AM, please contact night-coverage [email or web address]

## 2014-05-18 NOTE — Progress Notes (Signed)
Patient is very lethargic, but is easily aroused and is sleeping peacefully. Patient continues with productive cough of thick, blood tinged sputum. Maintained on telemetry and is in accelerated junctional rhythm, with a heart rate of 84 on the monitor. Denies c/o chest pain and/or discomfort. Will continue to monitor.  Sharlene Doryanya Arjun Hard, RN

## 2014-05-18 NOTE — Progress Notes (Addendum)
    Subjective:  Sitting in chair, no significant SOB. Blood tinged sputum earlier this morning.   Objective:  Vital Signs in the last 24 hours: Temp:  [97.4 F (36.3 C)-98.2 F (36.8 C)] 98.2 F (36.8 C) (11/22 0900) Pulse Rate:  [59-84] 82 (11/22 0900) Resp:  [18] 18 (11/22 0900) BP: (114-138)/(68-96) 126/68 mmHg (11/22 0900) SpO2:  [97 %-100 %] 100 % (11/22 0900) Weight:  [154 lb 15.7 oz (70.3 kg)] 154 lb 15.7 oz (70.3 kg) (11/22 0641)  Intake/Output from previous day: 11/21 0701 - 11/22 0700 In: 960 [P.O.:960] Out: 1350 [Urine:1350]   Physical Exam: General: Pleasant, NAD. Neuro: Alert and oriented X 3. Moves all extremities spontaneously. Psych: Normal affect. HEENT: Normal Neck: Supple without bruits or JVD. Lungs: Resp regular and unlabored, mild rhonchi Heart: RRR no s3, s4. Grade 2/6 apical systolic murmur Abdomen: Soft, non-tender, non-distended, BS + x 4.  Extremities: No clubbing, cyanosis or edema. DP/PT/Radials 2+ and equal bilaterally.    Lab Results:  Recent Labs  05/17/14 0425 05/18/14 0458  WBC 11.0* 13.9*  HGB 10.2* 10.2*  PLT 201 186    Recent Labs  05/17/14 0425 05/18/14 0458  NA 133* 134*  K 4.5 4.2  CL 95* 96  CO2 20 20  GLUCOSE 76 94  BUN 95* 88*  CREATININE 3.12* 2.98*    Recent Labs  05/17/14 0425  PROT 6.1  ALBUMIN 2.3*  AST 67*  ALT 116*  ALKPHOS 261*  BILITOT 0.9   Telemetry: NSR, PVC Personally viewed.  Cardiac Studies:  EF 25%, at least moderate AS  Scheduled Meds: . dextromethorphan-guaiFENesin  1 tablet Oral BID  . docusate sodium  100 mg Oral BID  . feeding supplement (ENSURE COMPLETE)  237 mL Oral BID BM  . levofloxacin (LEVAQUIN) IV  500 mg Intravenous Q48H  . levothyroxine  50 mcg Oral QAC breakfast  . sodium chloride  10-40 mL Intracatheter Q12H  . Warfarin - Pharmacist Dosing Inpatient   Does not apply q1800   Continuous Infusions: . sodium chloride 10 mL/hr at 05/16/14 1228   PRN  Meds:.acetaminophen **OR** acetaminophen, alum & mag hydroxide-simeth, bisacodyl, morphine injection, ondansetron **OR** ondansetron (ZOFRAN) IV, oxyCODONE, polyethylene glycol, sodium chloride, zolpidem  Assessment/Plan:   1. Acute on chronic systolic congestive heart failure/ICM. Echocardiogram on 06/06/13 showed ejection fraction 20-25% with mild mitral regurgitation and moderate aortic stenosis. The patient continues off diuretics at the present time.  2. Demand ischemia. Troponins trending down. Peal 3.63--> 2.96  3.Pneumonia/sepsis on broad-spectrum antibiotics.  4.Chronic warfarin anticoagulation for severe vascular disease -- INR subtherapeutic after Vitamin K. -- would not be unreasonable to stop coumadin while having mild hemoptysis.  -- seems to be on coumadin for PVD and low EF. No stroke.   5. Acute on chronic kidney injury. BUN and creatinine is improving slightly. No evidence for internal bleeding. Hemoglobin improved.  6. Low blood pressure- not on any antihypertensives. This is improving.  Will follow.  SKAINS, MARK 05/18/2014, 12:20 PM

## 2014-05-19 ENCOUNTER — Inpatient Hospital Stay (HOSPITAL_COMMUNITY): Payer: Medicare HMO

## 2014-05-19 LAB — BASIC METABOLIC PANEL
ANION GAP: 14 (ref 5–15)
BUN: 78 mg/dL — AB (ref 6–23)
CHLORIDE: 99 meq/L (ref 96–112)
CO2: 21 mEq/L (ref 19–32)
Calcium: 7.9 mg/dL — ABNORMAL LOW (ref 8.4–10.5)
Creatinine, Ser: 2.51 mg/dL — ABNORMAL HIGH (ref 0.50–1.35)
GFR calc non Af Amer: 24 mL/min — ABNORMAL LOW (ref >90)
GFR, EST AFRICAN AMERICAN: 27 mL/min — AB (ref >90)
Glucose, Bld: 95 mg/dL (ref 70–99)
POTASSIUM: 4.2 meq/L (ref 3.7–5.3)
SODIUM: 134 meq/L — AB (ref 137–147)

## 2014-05-19 LAB — CBC
HCT: 31.4 % — ABNORMAL LOW (ref 39.0–52.0)
HEMOGLOBIN: 10.4 g/dL — AB (ref 13.0–17.0)
MCH: 28 pg (ref 26.0–34.0)
MCHC: 33.1 g/dL (ref 30.0–36.0)
MCV: 84.6 fL (ref 78.0–100.0)
Platelets: 190 10*3/uL (ref 150–400)
RBC: 3.71 MIL/uL — AB (ref 4.22–5.81)
RDW: 15.9 % — ABNORMAL HIGH (ref 11.5–15.5)
WBC: 15.8 10*3/uL — ABNORMAL HIGH (ref 4.0–10.5)

## 2014-05-19 LAB — PROTIME-INR
INR: 2.15 — ABNORMAL HIGH (ref 0.00–1.49)
PROTHROMBIN TIME: 24.2 s — AB (ref 11.6–15.2)

## 2014-05-19 MED ORDER — FUROSEMIDE 20 MG PO TABS
20.0000 mg | ORAL_TABLET | Freq: Every day | ORAL | Status: DC
  Administered 2014-05-19 – 2014-05-26 (×8): 20 mg via ORAL
  Filled 2014-05-19 (×9): qty 1

## 2014-05-19 MED ORDER — VITAMIN K1 10 MG/ML IJ SOLN
2.0000 mg | Freq: Once | INTRAMUSCULAR | Status: AC
  Administered 2014-05-19: 2 mg via SUBCUTANEOUS
  Filled 2014-05-19: qty 0.2

## 2014-05-19 NOTE — Progress Notes (Addendum)
UR completed Wymon Swaney K. Sherard Sutch, RN, BSN, MSHL, CCM  05/19/2014 12:40 PM

## 2014-05-19 NOTE — Progress Notes (Signed)
NUTRITION FOLLOW UP  Intervention:   Continue Ensure Complete BID in between meals Provide Magic Cup ice cream once daily  Nutrition Dx:   Inadequate oral intake related to acute illness, decreased appetite as evidenced by son report; ongoing  Goal:   Pt to meet >/= 90% of their estimated nutrition needs; unmet  Monitor:   PO & supplemental intake, weight, labs, I/O's  Assessment:   73 y.o. Male with PMH of CAD s/p CABG, stage III CKD, CVA and on chronic anticoagulation who presented to the ER with complaints of worsening cough and shortness of breath.   CT scan revealed right lower lobe pneumonia with associated effusion  Per nursing notes, patient was eating 75-100% of meals for the past 2 days but, today he has only been consuming liquids such as soup and ice cream. Before 2 days ago, patient was eating about 25% of meals due to nausea. Today, patient denies any nausea- states he just feels like having liquids. Patient reports he is doing well with the Ensure Complete, drinking it daily and feels he can continue. Per order history patient has been refusing Ensure Complete about 50% of the time. Family at bedside report patient prefers strawberry and chocolate flavored Ensure and prefers it cold.  Patient's weight is up 5 lbs from last week. -0.49 L balance since admission.  Labs: low sodium, elevated BUN, low calcium, low hemoglobin  Height: Ht Readings from Last 1 Encounters:  05/16/14 5\' 11"  (1.803 m)    Weight Status:   Wt Readings from Last 1 Encounters:  05/18/14 154 lb 15.7 oz (70.3 kg)    Re-estimated needs:  Kcal: 1700-1900 Protein: 80-90 gm Fluid: 1.7-1.9 L  Skin: intact  Diet Order: Diet Heart   Intake/Output Summary (Last 24 hours) at 05/19/14 1534 Last data filed at 05/19/14 1532  Gross per 24 hour  Intake   1560 ml  Output   1376 ml  Net    184 ml    Last BM: 11/21   Labs:   Recent Labs Lab 05/13/14 0740 05/14/14 0356 05/15/14 0500   05/17/14 0425 05/18/14 0458 05/19/14 0510  NA 126* 126* 133*  < > 133* 134* 134*  K 3.3* 4.0 3.9  < > 4.5 4.2 4.2  CL 86* 88* 95*  < > 95* 96 99  CO2 20 17* 18*  < > 20 20 21   BUN 93* 95* 97*  < > 95* 88* 78*  CREATININE 3.48* 3.83* 3.52*  < > 3.12* 2.98* 2.51*  CALCIUM 8.0* 8.3* 7.9*  < > 8.3* 8.2* 7.9*  MG 2.3 2.5 2.3  --   --   --   --   GLUCOSE 102* 110* 145*  < > 76 94 95  < > = values in this interval not displayed.  CBG (last 3)  No results for input(s): GLUCAP in the last 72 hours.  Scheduled Meds: . dextromethorphan-guaiFENesin  1 tablet Oral BID  . docusate sodium  100 mg Oral BID  . feeding supplement (ENSURE COMPLETE)  237 mL Oral BID BM  . furosemide  20 mg Oral Daily  . levothyroxine  50 mcg Oral QAC breakfast  . sodium chloride  10-40 mL Intracatheter Q12H    Continuous Infusions: . sodium chloride 10 mL/hr at 05/16/14 1228    Ian Malkineanne Barnett RD, LDN Inpatient Clinical Dietitian Pager: [phone] After Hours Pager: [phone]

## 2014-05-19 NOTE — Progress Notes (Signed)
Subjective:  Clinton [name] is a 73 y.o. male with extensive past medical history including coronary artery disease status post coronary artery bypass grafting, ischemic cardiomyopathy having ejection fraction of 20-25% based on transthoracic echocardiogram performed on 06/06/2013, peripheral vascular disease, stage III chronic kidney disease, history of CVA on chronic anticoagulation who presents to the emergency room with complaints of worsening cough and shortness of breath  Feels a little better.  Got some sleep.  Objective: Vital signs in last 24 hours: Temp:  [97.6 F (36.4 C)-98.4 F (36.9 C)] 98.4 F (36.9 C) (11/23 0647) Pulse Rate:  [64-83] 64 (11/23 0647) Resp:  [16-18] 18 (11/23 0647) BP: (109-124)/(64-76) 124/76 mmHg (11/23 0647) SpO2:  [95 %-97 %] 95 % (11/23 0647) Last BM Date: 05/17/14  Intake/Output from previous day: 11/22 0701 - 11/23 0700 In: 1260 [P.O.:1260] Out: 1351 [Urine:1350; Stool:1] Intake/Output this shift: Total I/O In: 300 [P.O.:300] Out: 150 [Urine:150]  Medications Current Facility-Administered Medications  Medication Dose Route Frequency Provider Last Rate Last Dose  . 0.9 %  sodium chloride infusion   Intravenous Continuous Russella DarAllison L Ellis, NP 10 mL/hr at 05/16/14 1228    . acetaminophen (TYLENOL) tablet 650 mg  650 mg Oral Q6H PRN Jeralyn BennettEzequiel Zamora, MD       Or  . acetaminophen (TYLENOL) suppository 650 mg  650 mg Rectal Q6H PRN Jeralyn BennettEzequiel Zamora, MD      . alum & mag hydroxide-simeth (MAALOX/MYLANTA) 200-200-20 MG/5ML suspension 30 mL  30 mL Oral Q6H PRN Jeralyn BennettEzequiel Zamora, MD      . bisacodyl (DULCOLAX) suppository 10 mg  10 mg Rectal Daily PRN Leda GauzeKaren J Kirby-Graham, NP   10 mg at 05/15/14 2131  . dextromethorphan-guaiFENesin (MUCINEX DM) 30-600 MG per 12 hr tablet 1 tablet  1 tablet Oral BID Drema Dallasurtis J Woods, MD   1 tablet at 05/18/14 2105  . docusate sodium (COLACE) capsule 100 mg  100 mg Oral BID Russella DarAllison L Ellis, NP   100 mg at 05/18/14  2105  . feeding supplement (ENSURE COMPLETE) (ENSURE COMPLETE) liquid 237 mL  237 mL Oral BID BM Ailene Ardsatherine C Lamberton, RD   237 mL at 05/17/14 1038  . furosemide (LASIX) tablet 20 mg  20 mg Oral Daily Calvert CantorSaima Rizwan, MD      . levofloxacin (LEVAQUIN) IVPB 500 mg  500 mg Intravenous Q48H Lonia BloodJeffrey T McClung, MD   500 mg at 05/18/14 1741  . levothyroxine (SYNTHROID, LEVOTHROID) tablet 50 mcg  50 mcg Oral QAC breakfast Jeralyn BennettEzequiel Zamora, MD   50 mcg at 05/19/14 0600  . morphine 2 MG/ML injection 2 mg  2 mg Intravenous Q4H PRN Jeralyn BennettEzequiel Zamora, MD      . ondansetron [facility](ZOFRAN) tablet 4 mg  4 mg Oral Q6H PRN Jeralyn BennettEzequiel Zamora, MD   4 mg at 05/12/14 2237   Or  . ondansetron (ZOFRAN) injection 4 mg  4 mg Intravenous Q6H PRN Jeralyn BennettEzequiel Zamora, MD   4 mg at 05/16/14 0927  . oxyCODONE (Oxy IR/ROXICODONE) immediate release tablet 5 mg  5 mg Oral Q4H PRN Jeralyn BennettEzequiel Zamora, MD   5 mg at 05/16/14 0051  . phytonadione (VITAMIN K) SQ injection 2 mg  2 mg Subcutaneous Once Saima Rizwan, MD      . polyethylene glycol (MIRALAX / GLYCOLAX) packet 17 g  17 g Oral Daily PRN Leda GauzeKaren J Kirby-Graham, NP   17 g at 05/14/14 2240  . sodium chloride 0.9 % injection 10-40 mL  10-40 mL Intracatheter Q12H Jeralyn BennettEzequiel Zamora,  MD   10 mL at 05/18/14 2105  . sodium chloride 0.9 % injection 10-40 mL  10-40 mL Intracatheter PRN Jeralyn BennettEzequiel Zamora, MD   20 mL at 05/18/14 1819  . zolpidem (AMBIEN) tablet 5 mg  5 mg Oral QHS PRN Jinger NeighborsMary A Lynch, NP   5 mg at 05/16/14 2132    PE: General appearance: alert, cooperative and no distress Neck: no JVD Lungs: Decreased BS at the bases, bilateral wheezing.  Heart: regular rate and rhythm and 2/6 sys MM Abdomen: BS nontender Extremities: No LEE Pulses: 2+ and symmetric Skin: Warm and dry Neurologic: Grossly normal  Lab Results:   Recent Labs  05/17/14 0425 05/18/14 0458 05/19/14 0510  WBC 11.0* 13.9* 15.8*  HGB 10.2* 10.2* 10.4*  HCT 31.0* 30.7* 31.4*  PLT 201 186 190   BMET  Recent Labs   05/17/14 0425 05/18/14 0458 05/19/14 0510  NA 133* 134* 134*  K 4.5 4.2 4.2  CL 95* 96 99  CO2 20 20 21   GLUCOSE 76 94 95  BUN 95* 88* 78*  CREATININE 3.12* 2.98* 2.51*  CALCIUM 8.3* 8.2* 7.9*   PT/INR  Recent Labs  05/17/14 0425 05/18/14 0458 05/19/14 0510  LABPROT 21.9* 22.3* 24.2*  INR 1.89* 1.94* 2.15*   PORTABLE CHEST - 1 VIEW  COMPARISON: Chest x-ray of 05/11/2014  FINDINGS: Moderate cardiomegaly is again noted. There are prominent interstitial markings at the bases with small right effusion suggestive of interstitial edema and mild CHF. Right PICC line is noted with the tip seen to the expected SVC -RA junction.  IMPRESSION: 1. Cardiomegaly and probable mild interstitial edema with small right effusion. 2. Right PICC line tip seen to expected SVC RA junction.  Assessment/Plan    CAP (community acquired pneumonia)  On ABX    Essential hypertension  BP stable   CAD, ARTERY BYPASS GRAFT   Peripheral vascular disease  Coumadin stopped due to hemoptysis.   INR 2.15.  Last dose 11/21.    Acute on chronic systolic CHF (congestive heart failure), NYHA class 4 Appears euvolemic.  Net fluids:  -0.8L.  Lasix 20mg  daily.   Last CXR today:  mild interstitial edema with small right effusion.    AKI (acute kidney injury)  SCr improving.     Demand ischemia  Peak troponin 3.63.  No CP currently.     Aortic stenosis, moderate    Sepsis due to other etiology   Transaminitis   Orthostatic hypotension     Expected DC tomorrow to SNF   LOS: 9 days    HAGER, BRYAN PA-C 05/19/2014 10:44 AM   Attending Note:   The patient was seen and examined.  Agree with assessment and plan as noted above.  Changes made to the above note as needed.  Pt is still very weak.   Troponin levels are c/w demand ischemia Plan is for SNF tomorrow.    Vesta MixerPhilip J. Nahser, Montez HagemanJr., MD, [facility]ACC 05/19/2014, 1:58 PM [address],  [address] Office [phone] Pager [phone]

## 2014-05-19 NOTE — Progress Notes (Signed)
[facility] Progress Note   Clinton [name] ZOX:[id number] DOB: 09/06/1940 DOA: 05/10/2014 PCP: Arlan OrganMANNING, JAMES S., MD  Brief narrative: Clinton HeadsCharles W [name] is a 73 y.o. male with a past medical history of coronary artery disease status post CABG, ischemic cardiomyopathy, chronic systolic heart failure with an EF of 20-25% admitted to the hospital with shortness of breath and cough. He was diagnosed with a right lower lobe pneumonia, acute on chronic renal failure and noted to have a mildly elevated troponin of 0.56. It was suspected he had acute on chronic heart failure as well and was admitted to the step down unit for further management. He was treated with IV antibiotics and IV diuretics. Troponins were noted to peak at 3.63.   Subjective: Continues to cough up blood which is unchanged from yesterday. No complaint of dyspnea.   Assessment/Plan: Principal Problem:   CAP / sepsis -Right lower lobe pneumonia with hemoptysis- see below in regards to discontinuing Coumadin -Cefepime initiated on 11/14 and transitioned to levofloxacin on 11/18- today is day 10 of antibiotics will d/c  Active Problems:    Acute on chronic systolic CHF (congestive heart failure), NYHA class 4 -EF 20-25%-has been diuresed sufficiently and diuretics have been discontinued -Cardiology has been assisting with management - CXR today reveals mild pulm edema- although he is asymptomatic, will resume home dose of lasix today  Coagulopathy - Has been on Coumadin apparently for peripheral vascular disease -INR was elevated at 8.53-the patient was given vitamin K-INR steadily improving and today is less than 2 therefore, - -Coumadin  resumed yesterday but hemoptysis continues and therefore have stopped coumadin once again and will not resume until hemoptysis has resolved completely.  - INR elevated today- small dose of Vit K given    AKI/ Orthostatic hypotension  -Due to diuresis and improved after holding  diuretics- resuming Lasix today    Demand ischemia -Max troponin was 3.63 and is now improving    Transaminitis -Suspecting shock liver due to sepsis, hypotension and decreased cardiac output secondary to heart failure -Statin is on hold- repeat LFTs tomorrow    Essential hypertension -Antihypertensives on hold    CAD, ARTERY BYPASS GRAFT    Peripheral vascular disease -Coumadin to be held for now  Code Status: Full code Family Communication:  Disposition Plan: To skilled nursing facility for rehabilitation DVT prophylaxis: Coumadin to be held-  Will place SCDs  Consultants: Cardiology  Procedures: None   Antibiotics: Anti-infectives    Start     Dose/Rate Route Frequency Ordered Stop   05/16/14 1800  levofloxacin (LEVAQUIN) IVPB 500 mg     500 mg100 mL/hr over 60 Minutes Intravenous Every 48 hours 05/14/14 1905     05/15/14 2200  vancomycin (VANCOCIN) IVPB 1000 mg/200 mL premix  Status:  Discontinued     1,000 mg200 mL/hr over 60 Minutes Intravenous Every 48 hours 05/14/14 1106 05/14/14 1840   05/14/14 1915  levofloxacin (LEVAQUIN) IVPB 750 mg     750 mg100 mL/hr over 90 Minutes Intravenous  Once 05/14/14 1905 05/14/14 2149   05/12/14 1100  vancomycin (VANCOCIN) IVPB 750 mg/150 ml premix     750 mg150 mL/hr over 60 Minutes Intravenous Every 24 hours 05/11/14 1038 05/14/14 1211   05/11/14 1500  vancomycin (VANCOCIN) IVPB 750 mg/150 ml premix  Status:  Discontinued     750 mg150 mL/hr over 60 Minutes Intravenous Every 24 hours 05/10/14 1430 05/10/14 1655   05/11/14 1045  vancomycin (VANCOCIN) IVPB 1000 mg/200 mL premix  1,000 mg200 mL/hr over 60 Minutes Intravenous NOW 05/11/14 1038 05/11/14 1602   05/10/14 1700  ceFEPIme (MAXIPIME) 1 g in dextrose 5 % 50 mL IVPB  Status:  Discontinued     1 g100 mL/hr over 30 Minutes Intravenous Every 24 hours 05/10/14 1641 05/14/14 1845   05/10/14 1430  vancomycin (VANCOCIN) IVPB 1000 mg/200 mL premix  Status:  Discontinued     1,000  mg200 mL/hr over 60 Minutes Intravenous NOW 05/10/14 1430 05/10/14 1655   05/10/14 1400  cefTRIAXone (ROCEPHIN) 1 g in dextrose 5 % 50 mL IVPB     1 g100 mL/hr over 30 Minutes Intravenous  Once 05/10/14 1356 05/10/14 1432   05/10/14 1400  azithromycin (ZITHROMAX) 500 mg in dextrose 5 % 250 mL IVPB     500 mg250 mL/hr over 60 Minutes Intravenous  Once 05/10/14 1356 05/10/14 1629         Objective: Filed Weights   05/16/14 1111 05/17/14 0517 05/18/14 0641  Weight: 68.221 kg (150 lb 6.4 oz) 67.994 kg (149 lb 14.4 oz) 70.3 kg (154 lb 15.7 oz)    Intake/Output Summary (Last 24 hours) at 05/19/14 1127 Last data filed at 05/19/14 0941  Gross per 24 hour  Intake   1200 ml  Output   1301 ml  Net   -101 ml     Vitals Filed Vitals:   05/18/14 0900 05/18/14 1430 05/18/14 2037 05/19/14 0647  BP: 126/68 109/64 115/70 124/76  Pulse: 82 81 83 64  Temp: 98.2 F (36.8 C) 97.8 F (36.6 C) 97.6 F (36.4 C) 98.4 F (36.9 C)  TempSrc: Oral Oral Oral Oral  Resp: 18 18 16 18   Height:      Weight:      SpO2: 100% 97% 97% 95%    Exam: General: Awake alert oriented 3, No acute distress Lungs: CTA b/l  Cardiovascular: Regular rate and rhythm without murmur gallop or rub normal S1 and S2 Abdomen: Nontender, nondistended, soft, bowel sounds positive, no rebound, no ascites, no appreciable mass Extremities: No significant cyanosis, clubbing, or edema bilateral lower extremities  Data Reviewed: Basic Metabolic Panel:  Recent Labs Lab 05/13/14 0740 05/14/14 0356 05/15/14 0500 05/16/14 0530 05/17/14 0425 05/18/14 0458 05/19/14 0510  NA 126* 126* 133* 134* 133* 134* 134*  K 3.3* 4.0 3.9 4.3 4.5 4.2 4.2  CL 86* 88* 95* 94* 95* 96 99  CO2 20 17* 18* 20 20 20 21   GLUCOSE 102* 110* 145* 92 76 94 95  BUN 93* 95* 97* 98* 95* 88* 78*  CREATININE 3.48* 3.83* 3.52* 3.37* 3.12* 2.98* 2.51*  CALCIUM 8.0* 8.3* 7.9* 8.6 8.3* 8.2* 7.9*  MG 2.3 2.5 2.3  --   --   --   --    Liver Function  Tests:  Recent Labs Lab 05/13/14 0740 05/14/14 0356 05/15/14 0500 05/16/14 0530 05/17/14 0425  AST 145*  --  131* 112* 67*  ALT 148*  --  140* 144* 116*  ALKPHOS 179*  --  241* 272* 261*  BILITOT 1.3*  --  0.8 0.9 0.9  PROT 6.2  --  5.9* 6.4 6.1  ALBUMIN 2.5* 2.6* 2.2* 2.5* 2.3*   No results for input(s): LIPASE, AMYLASE in the last 168 hours. No results for input(s): AMMONIA in the last 168 hours. CBC:  Recent Labs Lab 05/13/14 0740 05/13/14 1920  05/15/14 0656 05/16/14 0530 05/17/14 0425 05/18/14 0458 05/19/14 0510  WBC 13.4* 11.6*  < > 13.2* 12.4* 11.0* 13.9* 15.8*  NEUTROABS 12.5* 10.8*  --   --   --   --   --   --   HGB 10.0* 9.6*  < > 9.0* 10.2* 10.2* 10.2* 10.4*  HCT 29.1* 27.6*  < > 26.9* 30.5* 31.0* 30.7* 31.4*  MCV 80.6 79.8  < > 80.1 80.9 82.0 81.9 84.6  PLT 156 159  < > 188 234 201 186 190  < > = values in this interval not displayed. Cardiac Enzymes: No results for input(s): CKTOTAL, CKMB, CKMBINDEX, TROPONINI in the last 168 hours. BNP (last 3 results)  Recent Labs  06/05/13 0838 07/29/13 0818 05/10/14 0935  PROBNP 17824.0* 3171.0* 63888.0*   CBG: No results for input(s): GLUCAP in the last 168 hours.  Recent Results (from the past 240 hour(s))  Urine culture     Status: None   Collection Time: 05/10/14 10:03 AM  Result Value Ref Range Status   Specimen Description URINE, CLEAN CATCH  Final   Special Requests NONE  Final   Culture  Setup Time   Final    05/10/2014 20:50 Performed at [facility]    Colony Count NO GROWTH Performed at [facility]   Final   Culture NO GROWTH Performed at [facility]   Final   Report Status 05/11/2014 FINAL  Final  Culture, blood (routine x 2)     Status: None   Collection Time: 05/10/14  7:05 PM  Result Value Ref Range Status   Specimen Description BLOOD LEFT FOREARM  Final   Special Requests BOTTLES DRAWN AEROBIC ONLY 4CC  Final   Culture  Setup Time   Final    05/11/2014  01:38 Performed at [facility]    Culture   Final    NO GROWTH 5 DAYS Performed at [facility]    Report Status 05/17/2014 FINAL  Final  Culture, blood (routine x 2)     Status: None   Collection Time: 05/10/14  7:10 PM  Result Value Ref Range Status   Specimen Description BLOOD RIGHT HAND  Final   Special Requests BOTTLES DRAWN AEROBIC ONLY 5CC  Final   Culture  Setup Time   Final    05/11/2014 01:38 Performed at [facility]    Culture   Final    NO GROWTH 5 DAYS Performed at [facility]    Report Status 05/17/2014 FINAL  Final     Studies:  Recent x-ray studies have been reviewed in detail by the Attending Physician  Scheduled Meds:  Scheduled Meds: . dextromethorphan-guaiFENesin  1 tablet Oral BID  . docusate sodium  100 mg Oral BID  . feeding supplement (ENSURE COMPLETE)  237 mL Oral BID BM  . furosemide  20 mg Oral Daily  . levofloxacin (LEVAQUIN) IV  500 mg Intravenous Q48H  . levothyroxine  50 mcg Oral QAC breakfast  . sodium chloride  10-40 mL Intracatheter Q12H   Continuous Infusions: . sodium chloride 10 mL/hr at 05/16/14 1228    Time spent on care of this patient: 35 min   Thresa Dozier, MD 05/19/2014, 11:27 AM  LOS: 9 days   [facility] Office  [phone] Pager - Text Page per [email or web address]  If 7PM-7AM, please contact night-coverage [email or web address]

## 2014-05-19 NOTE — Progress Notes (Signed)
Per discussion with Dr. Butler Denmarkizwan- [facility].  [facility]- she will arrange for patient's admission papers to be signed today. Facility is working on [employer] authorization today as well.  Will complete d/c arrangements tomorrow.  Lorri Frederickonna T. Jaci LazierCrowder, [place]CS[place]  [phone]

## 2014-05-20 LAB — CBC
HCT: 35.9 % — ABNORMAL LOW (ref 39.0–52.0)
HEMOGLOBIN: 11.6 g/dL — AB (ref 13.0–17.0)
MCH: 27.7 pg (ref 26.0–34.0)
MCHC: 32.3 g/dL (ref 30.0–36.0)
MCV: 85.7 fL (ref 78.0–100.0)
PLATELETS: 214 10*3/uL (ref 150–400)
RBC: 4.19 MIL/uL — ABNORMAL LOW (ref 4.22–5.81)
RDW: 16.2 % — ABNORMAL HIGH (ref 11.5–15.5)
WBC: 17.1 10*3/uL — AB (ref 4.0–10.5)

## 2014-05-20 LAB — BASIC METABOLIC PANEL
ANION GAP: 15 (ref 5–15)
BUN: 67 mg/dL — AB (ref 6–23)
CO2: 22 mEq/L (ref 19–32)
Calcium: 7.9 mg/dL — ABNORMAL LOW (ref 8.4–10.5)
Chloride: 102 mEq/L (ref 96–112)
Creatinine, Ser: 2.18 mg/dL — ABNORMAL HIGH (ref 0.50–1.35)
GFR calc Af Amer: 33 mL/min — ABNORMAL LOW (ref >90)
GFR, EST NON AFRICAN AMERICAN: 28 mL/min — AB (ref >90)
Glucose, Bld: 82 mg/dL (ref 70–99)
Potassium: 4.7 mEq/L (ref 3.7–5.3)
Sodium: 139 mEq/L (ref 137–147)

## 2014-05-20 LAB — URINALYSIS, ROUTINE W REFLEX MICROSCOPIC
BILIRUBIN URINE: NEGATIVE
Glucose, UA: NEGATIVE mg/dL
HGB URINE DIPSTICK: NEGATIVE
Ketones, ur: NEGATIVE mg/dL
Leukocytes, UA: NEGATIVE
NITRITE: NEGATIVE
PROTEIN: NEGATIVE mg/dL
Specific Gravity, Urine: 1.015 (ref 1.005–1.030)
UROBILINOGEN UA: 0.2 mg/dL (ref 0.0–1.0)
pH: 5 (ref 5.0–8.0)

## 2014-05-20 LAB — PROTIME-INR
INR: 1.81 — AB (ref 0.00–1.49)
PROTHROMBIN TIME: 21.1 s — AB (ref 11.6–15.2)

## 2014-05-20 NOTE — Progress Notes (Addendum)
[facility] Progress Note   Clinton HeadsCharles W Swisher ZOX:[id number] DOB: 17-Jul-1940 DOA: 05/10/2014 PCP: Arlan OrganMANNING, JAMES S., MD  Brief narrative: Clinton HeadsCharles W [name] is a 73 y.o. male with a past medical history of coronary artery disease status post CABG, ischemic cardiomyopathy, chronic systolic heart failure with an EF of 20-25% admitted to the hospital with shortness of breath and cough. He was diagnosed with a right lower lobe pneumonia, acute on chronic renal failure and noted to have a mildly elevated troponin of 0.56. It was suspected he had acute on chronic heart failure as well and was admitted to the step down unit for further management. He was treated with IV antibiotics and IV diuretics. Troponins were noted to peak at 3.63.   Subjective: The patient continues to have a complaint of coughing up blood- he does not note that there has been any improvement  Assessment/Plan: Principal Problem:   CAP / sepsis -Right lower lobe pneumonia with hemoptysis- see below in regards to discontinuing Coumadin -Cefepime initiated on 11/14 and transitioned to levofloxacin on 11/18 - completed a 10 day course - repeat CXR reveals resolution of pneumonia-   Active Problems:    Acute on chronic systolic CHF (congestive heart failure), NYHA class 4 -EF 20-25%-has been diuresed sufficiently and diuretics were held for a few days due to acute renal failure and volume depletion -Cardiology has been assisting with management - CXR 11/23 reveals mild pulm edema- remains asymptomatic- resumed home dose of lasix 11/23  Leukocytosis - source uncertain- pneumonia does not appear to be recurring but will need to watch symptoms- may be aspirating?- monitor for now- will not obtain SLP eval-  CXR from yesterday consistent with mild pulm edema but no focal infiltrates - check UA today- monitor for fevers- repeat WBC count in AM  Coagulopathy/ hemoptysis - Has been on Coumadin apparently for peripheral  vascular disease -INR was elevated at 8.53-the patient was given vitamin K-INR steadily improving and today is less than 2 therefore, - -Coumadin resumed but hemoptysis continues and therefore I have stopped Coumadin once again and will not resume until hemoptysis has resolved completely.  - INR elevated 11/23- small dose of Vit K given- now down to 1.81    AKI/ Orthostatic hypotension  -Due to diuresis and improved after holding diuretics- resumed Lasix 11/23    Demand ischemia -Max troponin was 3.63 and is now improving    Transaminitis -Suspecting shock liver due to sepsis, hypotension and decreased cardiac output secondary to heart failure -Statin is on hold- repeat LFTs tomorrow    Essential hypertension -Antihypertensives on hold    CAD, ARTERY BYPASS GRAFT    Peripheral vascular disease -Coumadin to be held for now  Code Status: Full code Family Communication:  Disposition Plan: To skilled nursing facility for rehabilitation DVT prophylaxis: Coumadin held-  Will place SCDs  Consultants: Cardiology  Procedures: None   Antibiotics: Anti-infectives    Start     Dose/Rate Route Frequency Ordered Stop   05/16/14 1800  levofloxacin (LEVAQUIN) IVPB 500 mg  Status:  Discontinued     500 mg100 mL/hr over 60 Minutes Intravenous Every 48 hours 05/14/14 1905 05/19/14 1128   05/15/14 2200  vancomycin (VANCOCIN) IVPB 1000 mg/200 mL premix  Status:  Discontinued     1,000 mg200 mL/hr over 60 Minutes Intravenous Every 48 hours 05/14/14 1106 05/14/14 1840   05/14/14 1915  levofloxacin (LEVAQUIN) IVPB 750 mg     750 mg100 mL/hr over 90 Minutes Intravenous  Once  05/14/14 1905 05/14/14 2149   05/12/14 1100  vancomycin (VANCOCIN) IVPB 750 mg/150 ml premix     750 mg150 mL/hr over 60 Minutes Intravenous Every 24 hours 05/11/14 1038 05/14/14 1211   05/11/14 1500  vancomycin (VANCOCIN) IVPB 750 mg/150 ml premix  Status:  Discontinued     750 mg150 mL/hr over 60 Minutes Intravenous Every  24 hours 05/10/14 1430 05/10/14 1655   05/11/14 1045  vancomycin (VANCOCIN) IVPB 1000 mg/200 mL premix     1,000 mg200 mL/hr over 60 Minutes Intravenous NOW 05/11/14 1038 05/11/14 1602   05/10/14 1700  ceFEPIme (MAXIPIME) 1 g in dextrose 5 % 50 mL IVPB  Status:  Discontinued     1 g100 mL/hr over 30 Minutes Intravenous Every 24 hours 05/10/14 1641 05/14/14 1845   05/10/14 1430  vancomycin (VANCOCIN) IVPB 1000 mg/200 mL premix  Status:  Discontinued     1,000 mg200 mL/hr over 60 Minutes Intravenous NOW 05/10/14 1430 05/10/14 1655   05/10/14 1400  cefTRIAXone (ROCEPHIN) 1 g in dextrose 5 % 50 mL IVPB     1 g100 mL/hr over 30 Minutes Intravenous  Once 05/10/14 1356 05/10/14 1432   05/10/14 1400  azithromycin (ZITHROMAX) 500 mg in dextrose 5 % 250 mL IVPB     500 mg250 mL/hr over 60 Minutes Intravenous  Once 05/10/14 1356 05/10/14 1629         Objective: Filed Weights   05/17/14 0517 05/18/14 0641 05/20/14 0721  Weight: 67.994 kg (149 lb 14.4 oz) 70.3 kg (154 lb 15.7 oz) 66.497 kg (146 lb 9.6 oz)    Intake/Output Summary (Last 24 hours) at 05/20/14 1234 Last data filed at 05/20/14 1111  Gross per 24 hour  Intake 4483.5 ml  Output   1275 ml  Net 3208.5 ml     Vitals Filed Vitals:   05/19/14 0647 05/19/14 1537 05/19/14 2141 05/20/14 0721  BP: 124/76 104/60 135/71 140/66  Pulse: 64 83 72 70  Temp: 98.4 F (36.9 C) 97.6 F (36.4 C) 97.8 F (36.6 C) 98 F (36.7 C)  TempSrc: Oral Oral Oral Oral  Resp: 18 18 18 16   Height:      Weight:    66.497 kg (146 lb 9.6 oz)  SpO2: 95% 98% 95% 96%    Exam: General: Awake alert oriented 3, No acute distress Lungs: CTA b/l  Cardiovascular: Regular rate and rhythm without murmur gallop or rub normal S1 and S2 Abdomen: Nontender, nondistended, soft, bowel sounds positive, no rebound, no ascites, no appreciable mass Extremities: No significant cyanosis, clubbing, or edema bilateral lower extremities  Data Reviewed: Basic Metabolic  Panel:  Recent Labs Lab 05/14/14 0356 05/15/14 0500 05/16/14 0530 05/17/14 0425 05/18/14 0458 05/19/14 0510 05/20/14 0510  NA 126* 133* 134* 133* 134* 134* 139  K 4.0 3.9 4.3 4.5 4.2 4.2 4.7  CL 88* 95* 94* 95* 96 99 102  CO2 17* 18* 20 20 20 21 22   GLUCOSE 110* 145* 92 76 94 95 82  BUN 95* 97* 98* 95* 88* 78* 67*  CREATININE 3.83* 3.52* 3.37* 3.12* 2.98* 2.51* 2.18*  CALCIUM 8.3* 7.9* 8.6 8.3* 8.2* 7.9* 7.9*  MG 2.5 2.3  --   --   --   --   --    Liver Function Tests:  Recent Labs Lab 05/14/14 0356 05/15/14 0500 05/16/14 0530 05/17/14 0425  AST  --  131* 112* 67*  ALT  --  140* 144* 116*  ALKPHOS  --  241* 272*  261*  BILITOT  --  0.8 0.9 0.9  PROT  --  5.9* 6.4 6.1  ALBUMIN 2.6* 2.2* 2.5* 2.3*   No results for input(s): LIPASE, AMYLASE in the last 168 hours. No results for input(s): AMMONIA in the last 168 hours. CBC:  Recent Labs Lab 05/13/14 1920  05/16/14 0530 05/17/14 0425 05/18/14 0458 05/19/14 0510 05/20/14 0510  WBC 11.6*  < > 12.4* 11.0* 13.9* 15.8* 17.1*  NEUTROABS 10.8*  --   --   --   --   --   --   HGB 9.6*  < > 10.2* 10.2* 10.2* 10.4* 11.6*  HCT 27.6*  < > 30.5* 31.0* 30.7* 31.4* 35.9*  MCV 79.8  < > 80.9 82.0 81.9 84.6 85.7  PLT 159  < > 234 201 186 190 214  < > = values in this interval not displayed. Cardiac Enzymes: No results for input(s): CKTOTAL, CKMB, CKMBINDEX, TROPONINI in the last 168 hours. BNP (last 3 results)  Recent Labs  06/05/13 0838 07/29/13 0818 05/10/14 0935  PROBNP 17824.0* 3171.0* 63888.0*   CBG: No results for input(s): GLUCAP in the last 168 hours.  Recent Results (from the past 240 hour(s))  Culture, blood (routine x 2)     Status: None   Collection Time: 05/10/14  7:05 PM  Result Value Ref Range Status   Specimen Description BLOOD LEFT FOREARM  Final   Special Requests BOTTLES DRAWN AEROBIC ONLY 4CC  Final   Culture  Setup Time   Final    05/11/2014 01:38 Performed at [facility]    Culture    Final    NO GROWTH 5 DAYS Performed at [facility]    Report Status 05/17/2014 FINAL  Final  Culture, blood (routine x 2)     Status: None   Collection Time: 05/10/14  7:10 PM  Result Value Ref Range Status   Specimen Description BLOOD RIGHT HAND  Final   Special Requests BOTTLES DRAWN AEROBIC ONLY 5CC  Final   Culture  Setup Time   Final    05/11/2014 01:38 Performed at [facility]    Culture   Final    NO GROWTH 5 DAYS Performed at [facility]    Report Status 05/17/2014 FINAL  Final     Studies:  Recent x-ray studies have been reviewed in detail by the Attending Physician  Scheduled Meds:  Scheduled Meds: . dextromethorphan-guaiFENesin  1 tablet Oral BID  . docusate sodium  100 mg Oral BID  . feeding supplement (ENSURE COMPLETE)  237 mL Oral BID BM  . furosemide  20 mg Oral Daily  . levothyroxine  50 mcg Oral QAC breakfast  . sodium chloride  10-40 mL Intracatheter Q12H   Continuous Infusions: . sodium chloride 10 mL/hr at 05/16/14 1228    Time spent on care of this patient: 35 min   Ary Lavine, MD 05/20/2014, 12:34 PM  LOS: 10 days   [facility] Office  [phone] Pager - Text Page per [email or web address]  If 7PM-7AM, please contact night-coverage [email or web address]

## 2014-05-20 NOTE — Progress Notes (Signed)
Physical Therapy Treatment Patient Details Name: Clinton Clinton [name] MRN: [id number] DOB: 04-24-1941 Today'Clinton [name] Date: 05/20/2014    History of Present Illness Patient with complex medical history presents with sepsis and work up revealing Community-acquired pneumonia.     PT Comments    Patient progressing towards physical therapy goals. Able to ambulate up to 200 feet with a rolling walker at min guard assist level for safety. Tolerates therapeutic exercises well and motivated to work with therapy. Due to fluctuations in his mobility status continue to recommend SNF for short term rehabilitation, however if he continues to progress to a mod I level of independence, may be a candidate for HHPT. Patient will continue to benefit from skilled physical therapy services to further improve independence with functional mobility.   Follow Up Recommendations  SNF;Supervision/Assistance - 24 hour     Equipment Recommendations  Rolling walker with 5" wheels (pending progress)    Recommendations for Other Services       Precautions / Restrictions Precautions Precautions: Fall Restrictions Weight Bearing Restrictions: No    Mobility  Bed Mobility                  Transfers Overall transfer level: Needs assistance Equipment used: Rolling walker (2 wheeled) Transfers: Sit to/from Stand Sit to Stand: Min guard         General transfer comment: Min guard for safety. VC for hand placement. Performed x2 from reclining chair.  Ambulation/Gait Ambulation/Gait assistance: Min guard Ambulation Distance (Feet): 200 Feet (additonal bout of 150) Assistive device: Rolling walker (2 wheeled) Gait Pattern/deviations: Step-through pattern;Decreased stride length;Trunk flexed;Drifts right/left Gait velocity: decreased   General Gait Details: improved balance today with use of a rolling walker, remained at a min guard level throughout bouts with SpO2 96-98% on room air. Pt needed extended  seated rest break at 200 feet but was willing to participate in further gait training after this break. No loss of balance requiring physical assist today. Cues intermittently for upright posture and for walker placement within base of support.   Stairs            Wheelchair Mobility    Modified Rankin (Stroke Patients Only)       Balance                                    Cognition Arousal/Alertness: Awake/alert Behavior During Therapy: Flat affect Overall Cognitive Status: Within Functional Limits for tasks assessed                      Exercises General Exercises - Lower Extremity Ankle Circles/Pumps: AROM;Both;10 reps;Seated Quad Sets: Strengthening;Both;10 reps;Seated Gluteal Sets: Strengthening;Both;10 reps;Seated Long Arc Quad: Strengthening;Both;10 reps;Seated Hip Flexion/Marching: Strengthening;Both;10 reps;Seated    General Comments General comments (skin integrity, edema, etc.): Bloody sputum noted in cup beside pt. RN already aware.      Pertinent Vitals/Pain Pain Assessment: No/denies pain Pain Intervention(Clinton [name]): Monitored during session    Home Living                      Prior Function            PT Goals (current goals can now be found in the care plan section) Acute Rehab PT Goals PT Goal Formulation: With patient Time For Goal Achievement: 05/27/14 Potential to Achieve Goals: Good Progress towards PT goals: Progressing toward goals  Frequency  Min 3X/week    PT Plan Current plan remains appropriate    Co-evaluation             End of Session Equipment Utilized During Treatment: Gait belt Activity Tolerance: Patient tolerated treatment well Patient left: in chair;with call bell/phone within reach     Time: 1512-1536 PT Time Calculation (min) (ACUTE ONLY): 24 min  Charges:  $Gait Training: 8-22 mins $Therapeutic Exercise: 8-22 mins                    G Codes:      Clinton Clinton [name], Clinton Clinton [name] 05/20/2014, 4:18 PM  Clinton Clinton [name], [place] [phone]

## 2014-05-20 NOTE — Progress Notes (Signed)
Patient Name: Clinton [name] Date of Encounter: 05/20/2014     Principal Problem:   CAP (community acquired pneumonia) Active Problems:   Essential hypertension   CAD, ARTERY BYPASS GRAFT   Peripheral vascular disease   Acute on chronic systolic CHF (congestive heart failure), NYHA class 4   AKI (acute kidney injury)   Sepsis   PNA (pneumonia)   Demand ischemia   Sepsis due to other etiology   Transaminitis   Orthostatic hypotension   Acute on chronic systolic congestive heart failure    SUBJECTIVE  Clinton [name] is a 73 y.o. male with extensive past medical history including coronary artery disease status post coronary artery bypass grafting, ischemic cardiomyopathy having ejection fraction of 20-25% based on transthoracic echocardiogram performed on 06/06/2013, peripheral vascular disease, stage III chronic kidney disease, history of CVA on chronic anticoagulation who presents to the emergency room with complaints of worsening cough and shortness of breath  Denies any CP or SOB. Continue to cough up blood tinged sputum this morning.   CURRENT MEDS . dextromethorphan-guaiFENesin  1 tablet Oral BID  . docusate sodium  100 mg Oral BID  . feeding supplement (ENSURE COMPLETE)  237 mL Oral BID BM  . furosemide  20 mg Oral Daily  . levothyroxine  50 mcg Oral QAC breakfast  . sodium chloride  10-40 mL Intracatheter Q12H    OBJECTIVE  Filed Vitals:   05/19/14 0647 05/19/14 1537 05/19/14 2141 05/20/14 0721  BP: 124/76 104/60 135/71 140/66  Pulse: 64 83 72 70  Temp: 98.4 F (36.9 C) 97.6 F (36.4 C) 97.8 F (36.6 C) 98 F (36.7 C)  TempSrc: Oral Oral Oral Oral  Resp: 18 18 18 16   Height:      Weight:    146 lb 9.6 oz (66.497 kg)  SpO2: 95% 98% 95% 96%    Intake/Output Summary (Last 24 hours) at 05/20/14 0815 Last data filed at 05/20/14 0721  Gross per 24 hour  Intake 4363.5 ml  Output   1225 ml  Net 3138.5 ml   Filed Weights   05/17/14 0517 05/18/14  0641 05/20/14 0721  Weight: 149 lb 14.4 oz (67.994 kg) 154 lb 15.7 oz (70.3 kg) 146 lb 9.6 oz (66.497 kg)    PHYSICAL EXAM  General: Pleasant, NAD. Frail gentleman  Neuro: Alert and oriented X 3. Moves all extremities spontaneously. Psych: Normal affect. HEENT:  Normal  Neck: Supple without bruits or JVD. Lungs:  Resp regular and unlabored, Fine rales right base  Heart: RRR no s3, s4, or murmurs. Abdomen: Soft, non-tender, non-distended, BS + x 4.  Extremities: No clubbing, cyanosis or edema. DP/PT/Radials 2+ and equal bilaterally.  Accessory Clinical Findings  CBC  Recent Labs  05/19/14 0510 05/20/14 0510  WBC 15.8* 17.1*  HGB 10.4* 11.6*  HCT 31.4* 35.9*  MCV 84.6 85.7  PLT 190 214   Basic Metabolic Panel  Recent Labs  05/19/14 0510 05/20/14 0510  NA 134* 139  K 4.2 4.7  CL 99 102  CO2 21 22  GLUCOSE 95 82  BUN 78* 67*  CREATININE 2.51* 2.18*  CALCIUM 7.9* 7.9*    TELE NSR with LBBB, HR 60-90s    ECG  No new EKG  Echocardiogram 06/06/2013  LV EF: 20% -  25%  ------------------------------------------------------------ Indications:   CHF - 428.0.  ------------------------------------------------------------ Study Conclusions  - Left ventricle: The cavity size was severely dilated. Wall thickness was increased in a pattern of severe LVH. Systolic function  was severely reduced. The estimated ejection fraction was in the range of 20% to 25%. Diffuse hypokinesis. - Aortic valve: Heavily calcified Likely at least moderate AS given the degree of LV dysfunction Valve area: 1.07cm^2(VTI). Valve area: 1.08cm^2 (Vmax). - Mitral valve: Calcified annulus. Mildly thickened leaflets . Mild regurgitation. - Left atrium: The atrium was moderately dilated. - Right atrium: The atrium was mildly dilated. - Atrial septum: No defect or patent foramen ovale was identified. - Tricuspid valve: Mild-moderate regurgitation. - Pulmonary  arteries: PA peak pressure: 63mm Hg (S).    Radiology/Studies  Ct Abdomen Pelvis Wo Contrast  05/10/2014   CLINICAL DATA:  Right lower quadrant pain for 5 days  EXAM: CT ABDOMEN AND PELVIS WITHOUT CONTRAST  TECHNIQUE: Multidetector CT imaging of the abdomen and pelvis was performed following the standard protocol without IV contrast.  COMPARISON:  None.  FINDINGS: Lung bases show of the left lung base be within normal limits. Right lower lobe pneumonia with associated effusion is seen.  The liver, gallbladder, spleen, adrenal glands and pancreas are within normal limits. The kidneys are well visualized bilaterally and demonstrate renal cystic change. Some of these have attenuation greater than normal fluid and likely represent hemorrhagic cysts. No renal calculi or obstructive changes are seen. Changes consistent with aortobifemoral bypass graft are noted. The appendix is within normal limits. No definitive inflammatory changes are seen.  Bladder is partially distended. Free fluid is noted within the pelvis although no cause towards factors are seen. Small fluid containing left inguinal hernia is noted. Bony structures are within normal limits.  IMPRESSION: Right lower lobe pneumonia with associated effusion.  Free fluid within the pelvis of uncertain etiology.  Chronic changes as described.   Electronically Signed   By: Alcide CleverMark  Lukens M.D.   On: 05/10/2014 13:35   X-ray Chest Pa And Lateral  05/11/2014   CLINICAL DATA:  Pneumonia  EXAM: CHEST  2 VIEW  COMPARISON:  05/10/2014  FINDINGS: Cardiomediastinal silhouette is stable. Persistent small right pleural effusion with right lower lobe infiltrate/pneumonia. Status post median sternotomy. Central mild bronchitic changes.  IMPRESSION: Persistent small right pleural effusion with right lower lobe infiltrate/ pneumonia. No convincing pulmonary edema. Central mild bronchitic changes.   Electronically Signed   By: Natasha MeadLiviu  Pop M.D.   On: 05/11/2014 11:31   Dg  Chest Port 1 View  05/19/2014   CLINICAL DATA:  Pneumonia, short of breath, high mild ptosis  EXAM: PORTABLE CHEST - 1 VIEW  COMPARISON:  Chest x-ray of 05/11/2014  FINDINGS: Moderate cardiomegaly is again noted. There are prominent interstitial markings at the bases with small right effusion suggestive of interstitial edema and mild CHF. Right PICC line is noted with the tip seen to the expected SVC -RA junction.  IMPRESSION: 1. Cardiomegaly and probable mild interstitial edema with small right effusion. 2. Right PICC line tip seen to expected SVC RA junction.   Electronically Signed   By: Dwyane DeePaul  Barry M.D.   On: 05/19/2014 08:06   Dg Chest Port 1 View  05/10/2014   CLINICAL DATA:  Weakness.  Shortness of breath for 3 days.  Fatigue.  EXAM: PORTABLE CHEST - 1 VIEW  COMPARISON:  Two-view chest 06/05/2013.  FINDINGS: The heart is mildly enlarged. And chronic interstitial changes are similar to the prior study. This likely reflects some element of edema superimposed on chronic change. There is persistent blunting of the right costophrenic angle suggesting a small effusion or chronic pleural thickening. No focal airspace consolidation is evident.  IMPRESSION: 1. Cardiomegaly with mild edema superimposed on chronic interstitial coarsening. This likely reflects mild congestive heart failure. 2. Suspect small right pleural effusion.   Electronically Signed   By: Gennette Pac M.D.   On: 05/10/2014 10:26    ASSESSMENT AND PLAN   CAP (community acquired pneumonia) On ABX   Essential hypertension BP stable   CAD, ARTERY BYPASS GRAFT   Peripheral vascular disease Coumadin stopped due to hemoptysis. INR 1.8. Plan to restart coumadin once hemoptysis stop, currently still has hemoptysis.    Acute on chronic systolic CHF (congestive heart failure), NYHA class 4 Appears euvolemic. Weight down from 150 to 146lbs. Lasix 20mg  daily. Last CXR today: mild interstitial  edema with small right effusion.   AKI (acute kidney injury) SCr improving.    Demand ischemia Peak troponin 3.63. No CP currently.    Aortic stenosis, moderate   Sepsis due to other etiology  Transaminitis  Orthostatic hypotension    Leukocytosis, WBC trending up  - Blood culture obtained on 11/14 negative, afebrile, s/p abx, treatment per primary team   Signed, Azalee Course PA-C Pager: [phone]    Attending Note:   The patient was seen and examined.  Agree with assessment and plan as noted above.  Changes made to the above note as needed.  Pt has numerous chronic medical issues.  He is now coughing up blood tinged sputum and his WBC is up to 17. Has rales in right base - ? Aspiration issues.  No active cardiology issues. Will sign off Call for questions. The plan is for him to go to SNF upon discharge.   Follow up with Dr. Jens Som in the office.   Vesta Mixer, Montez Hageman., MD, [facility] 05/20/2014, 9:51 AM [address]  [address] Office [phone] Pager [phone]

## 2014-05-20 NOTE — Progress Notes (Signed)
Urine  For Routine w/reflex microscopic collected via clean catch and sent to the lab.  Amanda PeaNellie Tresea Heine, Charity fundraiserN.

## 2014-05-20 NOTE — Plan of Care (Signed)
Problem: Phase I Progression Outcomes Goal: Dyspnea controlled at rest Outcome: Completed/Met Date Met:  05/20/14 Goal: Other Phase I Outcomes/Goals Outcome: Not Applicable Date Met:  78/41/28  Problem: Phase II Progression Outcomes Goal: Wean O2 if indicated Outcome: Completed/Met Date Met:  05/20/14 Patient on room air. Goal: Pain controlled Outcome: Completed/Met Date Met:  05/20/14

## 2014-05-21 DIAGNOSIS — R042 Hemoptysis: Secondary | ICD-10-CM

## 2014-05-21 LAB — COMPREHENSIVE METABOLIC PANEL
ALT: 43 U/L (ref 0–53)
ANION GAP: 12 (ref 5–15)
AST: 20 U/L (ref 0–37)
Albumin: 2.4 g/dL — ABNORMAL LOW (ref 3.5–5.2)
Alkaline Phosphatase: 267 U/L — ABNORMAL HIGH (ref 39–117)
BUN: 59 mg/dL — AB (ref 6–23)
CALCIUM: 8.1 mg/dL — AB (ref 8.4–10.5)
CO2: 23 mEq/L (ref 19–32)
Chloride: 102 mEq/L (ref 96–112)
Creatinine, Ser: 1.92 mg/dL — ABNORMAL HIGH (ref 0.50–1.35)
GFR calc Af Amer: 38 mL/min — ABNORMAL LOW (ref >90)
GFR calc non Af Amer: 33 mL/min — ABNORMAL LOW (ref >90)
GLUCOSE: 106 mg/dL — AB (ref 70–99)
Potassium: 4.4 mEq/L (ref 3.7–5.3)
Sodium: 137 mEq/L (ref 137–147)
TOTAL PROTEIN: 6.2 g/dL (ref 6.0–8.3)
Total Bilirubin: 0.9 mg/dL (ref 0.3–1.2)

## 2014-05-21 LAB — PROTIME-INR
INR: 1.53 — ABNORMAL HIGH (ref 0.00–1.49)
PROTHROMBIN TIME: 18.5 s — AB (ref 11.6–15.2)

## 2014-05-21 LAB — CBC
HCT: 35.1 % — ABNORMAL LOW (ref 39.0–52.0)
Hemoglobin: 11.5 g/dL — ABNORMAL LOW (ref 13.0–17.0)
MCH: 27.8 pg (ref 26.0–34.0)
MCHC: 32.8 g/dL (ref 30.0–36.0)
MCV: 85 fL (ref 78.0–100.0)
PLATELETS: 191 10*3/uL (ref 150–400)
RBC: 4.13 MIL/uL — ABNORMAL LOW (ref 4.22–5.81)
RDW: 16 % — AB (ref 11.5–15.5)
WBC: 14.5 10*3/uL — ABNORMAL HIGH (ref 4.0–10.5)

## 2014-05-21 NOTE — Progress Notes (Signed)
CSW discussed patient with Dr. Butler Denmarkizwan in length of stay rounds this morning. Patient is not yet stable for d/c per MD.  CSW notified Donne HazelSharon Lowa, [facility]  [employer] [facility] is in place through Sunday. If patient is not d/c'd until Monday- will need to request a new auth. Per Dr. Butler Denmarkizwan- does not anticipate stability for d/c until Friday. SNF will not accept admissions tomorrow due to the Thanksgiving holiday.  Nursing aware. Lorri Frederickonna T. Jaci LazierCrowder, [place]  [phone]

## 2014-05-21 NOTE — Progress Notes (Signed)
[facility] Progress Note   Clinton [name]:[id number] DOB: 03-19-1941 DOA: 05/10/2014 PCP: Arlan Organ., MD  Brief narrative: Clinton [name] is a 73 y.o. male with a past medical history of coronary artery disease status post CABG, ischemic cardiomyopathy, chronic systolic heart failure with an EF of 20-25% admitted to the hospital with shortness of breath and cough. He was diagnosed with a right lower lobe pneumonia, acute on chronic renal failure and noted to have a mildly elevated troponin of 0.56. It was suspected he had acute on chronic heart failure as well and was admitted to the step down unit for further management. He was treated with IV antibiotics and IV diuretics. Troponins were noted to peak at 3.63.   Subjective: According to the patient and the nurse, hemoptysis is improving. Sputum is now blood-tinged rather than overtly bloody.  Assessment/Plan: Principal Problem:   CAP / sepsis -Right lower lobe pneumonia with hemoptysis- see below in regards to discontinuing Coumadin -Cefepime initiated on 11/14 and transitioned to levofloxacin on 11/18 - completed a 10 day course - repeat CXR reveals resolution of pneumonia-   Active Problems:    Acute on chronic systolic CHF (congestive heart failure), NYHA class 4 -EF 20-25%-has been diuresed sufficiently and diuretics were held for a few days due to acute renal failure and volume depletion -Cardiology has been assisting with management - CXR 11/23 reveals mild pulm edema therefore, I resumed Lasix on 11/23 - remains asymptomatic and is able to ambulate down the hall without dyspnea or hypoxia - continue to monitor fluid status carefully  Leukocytosis - source uncertain- pneumonia does not appear to be recurring but will need to watch symptoms- may be aspirating?- monitor for now-  recently repeated chest x-ray CXR from - consistent with mild pulm edema but no focal infiltrates - checked UA was negative for  infection- monitor for fevers- continue to follow daily WBC counts   Coagulopathy/ hemoptysis - Has been on Coumadin apparently for peripheral vascular disease as started by Dr. Jens Som many years ago -INR was elevated at 8.53-the patient was given vitamin K-INR steadily improving and today is less than 2 therefore, -  -Coumadin resumed , cardiology, but hemoptysis continued and therefore I discontinued the following day  - Do not plan to resume until hemoptysis has resolved completely.  - INR elevated 11/23- small dose of Vit K given- now down to 1.53    AKI/ Orthostatic hypotension  -Due to diuresis and improved after holding diuretics- resumed Lasix 11/23    Demand ischemia -Max troponin was 3.63 and is now improving    Transaminitis -Suspecting shock liver due to sepsis, hypotension and decreased cardiac output secondary to heart failure -Statin is on hold - repeat LFTs reveal improved AST and ALT but continues to have an elevated alkaline phosphatase-CT abdomen pelvis on admission does not reveal any abnormalities in the liver and gallbladder- elevated alkaline phosphatase may be secondary to vitamin D deficiency- vitamin D levels can be ordered as outpatient alkaline phosphatase continues to be elevated   Essential hypertension -Antihypertensives on hold    CAD, ARTERY BYPASS GRAFT    Severe Peripheral vascular disease -Coumadin to be held for now- see above  Code Status: Full code Family Communication:  Disposition Plan: To skilled nursing facility for rehabilitation DVT prophylaxis: Coumadin held-  Will place SCDs  Consultants: Cardiology  Procedures: None   Antibiotics: Anti-infectives    Start     Dose/Rate Route Frequency Ordered Stop  05/16/14 1800  levofloxacin (LEVAQUIN) IVPB 500 mg  Status:  Discontinued     500 mg100 mL/hr over 60 Minutes Intravenous Every 48 hours 05/14/14 1905 05/19/14 1128   05/15/14 2200  vancomycin (VANCOCIN) IVPB 1000 mg/200 mL  premix  Status:  Discontinued     1,000 mg200 mL/hr over 60 Minutes Intravenous Every 48 hours 05/14/14 1106 05/14/14 1840   05/14/14 1915  levofloxacin (LEVAQUIN) IVPB 750 mg     750 mg100 mL/hr over 90 Minutes Intravenous  Once 05/14/14 1905 05/14/14 2149   05/12/14 1100  vancomycin (VANCOCIN) IVPB 750 mg/150 ml premix     750 mg150 mL/hr over 60 Minutes Intravenous Every 24 hours 05/11/14 1038 05/14/14 1211   05/11/14 1500  vancomycin (VANCOCIN) IVPB 750 mg/150 ml premix  Status:  Discontinued     750 mg150 mL/hr over 60 Minutes Intravenous Every 24 hours 05/10/14 1430 05/10/14 1655   05/11/14 1045  vancomycin (VANCOCIN) IVPB 1000 mg/200 mL premix     1,000 mg200 mL/hr over 60 Minutes Intravenous NOW 05/11/14 1038 05/11/14 1602   05/10/14 1700  ceFEPIme (MAXIPIME) 1 g in dextrose 5 % 50 mL IVPB  Status:  Discontinued     1 g100 mL/hr over 30 Minutes Intravenous Every 24 hours 05/10/14 1641 05/14/14 1845   05/10/14 1430  vancomycin (VANCOCIN) IVPB 1000 mg/200 mL premix  Status:  Discontinued     1,000 mg200 mL/hr over 60 Minutes Intravenous NOW 05/10/14 1430 05/10/14 1655   05/10/14 1400  cefTRIAXone (ROCEPHIN) 1 g in dextrose 5 % 50 mL IVPB     1 g100 mL/hr over 30 Minutes Intravenous  Once 05/10/14 1356 05/10/14 1432   05/10/14 1400  azithromycin (ZITHROMAX) 500 mg in dextrose 5 % 250 mL IVPB     500 mg250 mL/hr over 60 Minutes Intravenous  Once 05/10/14 1356 05/10/14 1629         Objective: Filed Weights   05/17/14 0517 05/18/14 0641 05/20/14 0721  Weight: 67.994 kg (149 lb 14.4 oz) 70.3 kg (154 lb 15.7 oz) 66.497 kg (146 lb 9.6 oz)    Intake/Output Summary (Last 24 hours) at 05/21/14 1126 Last data filed at 05/21/14 0906  Gross per 24 hour  Intake    480 ml  Output    850 ml  Net   -370 ml     Vitals Filed Vitals:   05/20/14 0721 05/20/14 1457 05/20/14 2148 05/21/14 0446  BP: 140/66 112/88 120/84 141/64  Pulse: 70 72 84 72  Temp: 98 F (36.7 C) 97.6 F (36.4 C)  98  F (36.7 C)  TempSrc: Oral Oral  Oral  Resp: 16 18 17 18   Height:      Weight: 66.497 kg (146 lb 9.6 oz)     SpO2: 96% 99% 98% 97%    Exam: General: Awake alert oriented 3, No acute distress Lungs: CTA b/l  Cardiovascular: Regular rate and rhythm without murmur gallop or rub normal S1 and S2 Abdomen: Nontender, nondistended, soft, bowel sounds positive, no rebound, no ascites, no appreciable mass Extremities: No significant cyanosis, clubbing, or edema bilateral lower extremities  Data Reviewed: Basic Metabolic Panel:  Recent Labs Lab 05/15/14 0500  05/17/14 0425 05/18/14 0458 05/19/14 0510 05/20/14 0510 05/21/14 0521  NA 133*  < > 133* 134* 134* 139 137  K 3.9  < > 4.5 4.2 4.2 4.7 4.4  CL 95*  < > 95* 96 99 102 102  CO2 18*  < > 20 20 21  22 23  GLUCOSE 145*  < > 76 94 95 82 106*  BUN 97*  < > 95* 88* 78* 67* 59*  CREATININE 3.52*  < > 3.12* 2.98* 2.51* 2.18* 1.92*  CALCIUM 7.9*  < > 8.3* 8.2* 7.9* 7.9* 8.1*  MG 2.3  --   --   --   --   --   --   < > = values in this interval not displayed. Liver Function Tests:  Recent Labs Lab 05/15/14 0500 05/16/14 0530 05/17/14 0425 05/21/14 0521  AST 131* 112* 67* 20  ALT 140* 144* 116* 43  ALKPHOS 241* 272* 261* 267*  BILITOT 0.8 0.9 0.9 0.9  PROT 5.9* 6.4 6.1 6.2  ALBUMIN 2.2* 2.5* 2.3* 2.4*   No results for input(s): LIPASE, AMYLASE in the last 168 hours. No results for input(s): AMMONIA in the last 168 hours. CBC:  Recent Labs Lab 05/17/14 0425 05/18/14 0458 05/19/14 0510 05/20/14 0510 05/21/14 0521  WBC 11.0* 13.9* 15.8* 17.1* 14.5*  HGB 10.2* 10.2* 10.4* 11.6* 11.5*  HCT 31.0* 30.7* 31.4* 35.9* 35.1*  MCV 82.0 81.9 84.6 85.7 85.0  PLT 201 186 190 214 191   Cardiac Enzymes: No results for input(s): CKTOTAL, CKMB, CKMBINDEX, TROPONINI in the last 168 hours. BNP (last 3 results)  Recent Labs  06/05/13 0838 07/29/13 0818 05/10/14 0935  PROBNP 17824.0* 3171.0* 63888.0*   CBG: No results for  input(s): GLUCAP in the last 168 hours.  No results found for this or any previous visit (from the past 240 hour(s)).   Studies:  Recent x-ray studies have been reviewed in detail by the Attending Physician  Scheduled Meds:  Scheduled Meds: . dextromethorphan-guaiFENesin  1 tablet Oral BID  . docusate sodium  100 mg Oral BID  . feeding supplement (ENSURE COMPLETE)  237 mL Oral BID BM  . furosemide  20 mg Oral Daily  . levothyroxine  50 mcg Oral QAC breakfast  . sodium chloride  10-40 mL Intracatheter Q12H   Continuous Infusions: . sodium chloride 10 mL/hr at 05/16/14 1228    Time spent on care of this patient: 35 min   Sabrin Dunlevy, MD 05/21/2014, 11:26 AM  LOS: 11 days   [facility] Office  [phone] Pager - Text Page per [email or web address]  If 7PM-7AM, please contact night-coverage [email or web address]

## 2014-05-21 NOTE — Progress Notes (Signed)
Physical Therapy Treatment Patient Details Name: Clinton [name] MRN: [id number] DOB: Apr 19, 1941 Today's Date: 05/21/2014    History of Present Illness Patient with complex medical history presents with sepsis and work up revealing Community-acquired pneumonia.     PT Comments    Progressing with mobility and stability, but still presents high fall risk and fatigued with little activity.  Still with bloody sputum in cup on b/s table.  Will need continued skilled PT in inpatient setting prior to d/c home.  Follow Up Recommendations  SNF;Supervision/Assistance - 24 hour     Equipment Recommendations  Rolling walker with 5" wheels    Recommendations for Other Services       Precautions / Restrictions Precautions Precautions: Fall    Mobility  Bed Mobility Overal bed mobility: Modified Independent                Transfers   Equipment used: Rolling walker (2 wheeled) Transfers: Sit to/from Stand Sit to Stand: Min guard         General transfer comment: cues to push from below rather than pull from above each time standing from bed  Ambulation/Gait Ambulation/Gait assistance: Supervision;Min guard Ambulation Distance (Feet): 200 Feet (then 150' after seated rest) Assistive device: Rolling walker (2 wheeled) Gait Pattern/deviations: Step-through pattern;Trunk flexed;Decreased stride length     General Gait Details: cues for forward gaze and for posture   Stairs            Wheelchair Mobility    Modified Rankin (Stroke Patients Only)       Balance Overall balance assessment: Needs assistance           Standing balance-Leahy Scale: Poor Standing balance comment: UE support needed partially due to posture and due to weakness                    Cognition Arousal/Alertness: Awake/alert Behavior During Therapy: Flat affect Overall Cognitive Status: Within Functional Limits for tasks assessed                      Exercises  General Exercises - Lower Extremity Short Arc Quad: AROM;Both;5 reps;Seated Hip Flexion/Marching: AROM;Both;10 reps;Seated Toe Raises: AROM;Both;10 reps;Seated Other Exercises Other Exercises: seated scapular squeeze x 10    General Comments        Pertinent Vitals/Pain Pain Assessment: No/denies pain    Home Living                      Prior Function            PT Goals (current goals can now be found in the care plan section) Progress towards PT goals: Progressing toward goals    Frequency  Min 3X/week    PT Plan Current plan remains appropriate    Co-evaluation             End of Session Equipment Utilized During Treatment: Gait belt Activity Tolerance: Patient tolerated treatment well Patient left: in bed;with call bell/phone within reach     Time: 1405-1425 PT Time Calculation (min) (ACUTE ONLY): 20 min  Charges:  $Gait Training: 8-22 mins                    G Codes:      Clinton [name],Clinton [name] 05/21/2014, 3:37 PM Clinton [name], PT [phone] 05/21/2014

## 2014-05-22 ENCOUNTER — Inpatient Hospital Stay (HOSPITAL_COMMUNITY): Payer: Medicare HMO

## 2014-05-22 LAB — PROTIME-INR
INR: 1.41 (ref 0.00–1.49)
Prothrombin Time: 17.4 seconds — ABNORMAL HIGH (ref 11.6–15.2)

## 2014-05-22 NOTE — Progress Notes (Signed)
[facility] Progress Note   Clinton [name] ZOX:[id number] DOB: 02/05/1941 DOA: 05/10/2014 PCP: Arlan Organ., MD  Brief narrative: Clinton [name] is a 73 y.o. male with a past medical history of coronary artery disease status post CABG, ischemic cardiomyopathy, chronic systolic heart failure with an EF of 20-25% admitted to the hospital with shortness of breath and cough. He was diagnosed with a right lower lobe pneumonia, acute on chronic renal failure and noted to have a mildly elevated troponin of 0.56. It was suspected he had acute on chronic heart failure as well and was admitted to the step down unit for further management. He was treated with IV antibiotics and IV diuretics. Troponins were noted to peak at 3.63.   Subjective: According to the patient and the nurse, hemoptysis is improving. Sputum is now blood-tinged rather than overtly bloody.  Assessment/Plan: Principal Problem:   CAP / sepsis -Right lower lobe pneumonia with hemoptysis- see below in regards to discontinuing Coumadin -Cefepime initiated on 11/14 and transitioned to levofloxacin on 11/18 - completed a 10 day course - repeat CXR reveals resolution of pneumonia-  - We'll check CT chest without contrast given patient hemoptysis despite treatment of his pneumonia, and holding his warfarin. Active Problems:    Acute on chronic systolic CHF (congestive heart failure), NYHA class 4 -EF 20-25%-has been diuresed sufficiently and diuretics were held for a few days due to acute renal failure and volume depletion -Cardiology has been assisting with management - CXR 11/23 reveals mild pulm edema therefore, I resumed Lasix on 11/23 - remains asymptomatic and is able to ambulate down the hall without dyspnea or hypoxia - continue to monitor fluid status carefully  Leukocytosis - source uncertain- pneumonia does not appear to be recurring but will need to watch symptoms- may be aspirating?- monitor for now-   recently repeated chest x-ray CXR from - consistent with mild pulm edema but no focal infiltrates - checked UA was negative for infection- monitor for fevers- continue to follow daily WBC counts  - Remains afebrile  Coagulopathy/ hemoptysis - Has been on Coumadin apparently for peripheral vascular disease as started by Dr. Jens Som many years ago -INR was elevated at 8.53-the patient was given vitamin K-INR steadily improving and today is less than 2 therefore, -  -Coumadin resumed , cardiology, but hemoptysis continued and therefore I discontinued the following day  - Do not plan to resume until hemoptysis has resolved completely.  - INR is 1.41 today.    AKI/ Orthostatic hypotension  -Due to diuresis and improved after holding diuretics- resumed Lasix 11/23    Demand ischemia -Max troponin was 3.63 and is now improving    Transaminitis -Suspecting shock liver due to sepsis, hypotension and decreased cardiac output secondary to heart failure -Statin is on hold - repeat LFTs reveal improved AST and ALT but continues to have an elevated alkaline phosphatase-CT abdomen pelvis on admission does not reveal any abnormalities in the liver and gallbladder- elevated alkaline phosphatase may be secondary to vitamin D deficiency- vitamin D levels can be ordered as outpatient alkaline phosphatase continues to be elevated   Essential hypertension -Antihypertensives on hold    CAD, ARTERY BYPASS GRAFT    Severe Peripheral vascular disease -Coumadin to be held for now- see above  Code Status: Full code Family Communication: No family at bedside. Disposition Plan: To skilled nursing facility for rehabilitation DVT prophylaxis: Coumadin held-  Will place SCDs  Consultants: Cardiology  Procedures: None   Antibiotics: Anti-infectives  Start     Dose/Rate Route Frequency Ordered Stop   05/16/14 1800  levofloxacin (LEVAQUIN) IVPB 500 mg  Status:  Discontinued     500 mg100 mL/hr over 60  Minutes Intravenous Every 48 hours 05/14/14 1905 05/19/14 1128   05/15/14 2200  vancomycin (VANCOCIN) IVPB 1000 mg/200 mL premix  Status:  Discontinued     1,000 mg200 mL/hr over 60 Minutes Intravenous Every 48 hours 05/14/14 1106 05/14/14 1840   05/14/14 1915  levofloxacin (LEVAQUIN) IVPB 750 mg     750 mg100 mL/hr over 90 Minutes Intravenous  Once 05/14/14 1905 05/14/14 2149   05/12/14 1100  vancomycin (VANCOCIN) IVPB 750 mg/150 ml premix     750 mg150 mL/hr over 60 Minutes Intravenous Every 24 hours 05/11/14 1038 05/14/14 1211   05/11/14 1500  vancomycin (VANCOCIN) IVPB 750 mg/150 ml premix  Status:  Discontinued     750 mg150 mL/hr over 60 Minutes Intravenous Every 24 hours 05/10/14 1430 05/10/14 1655   05/11/14 1045  vancomycin (VANCOCIN) IVPB 1000 mg/200 mL premix     1,000 mg200 mL/hr over 60 Minutes Intravenous NOW 05/11/14 1038 05/11/14 1602   05/10/14 1700  ceFEPIme (MAXIPIME) 1 g in dextrose 5 % 50 mL IVPB  Status:  Discontinued     1 g100 mL/hr over 30 Minutes Intravenous Every 24 hours 05/10/14 1641 05/14/14 1845   05/10/14 1430  vancomycin (VANCOCIN) IVPB 1000 mg/200 mL premix  Status:  Discontinued     1,000 mg200 mL/hr over 60 Minutes Intravenous NOW 05/10/14 1430 05/10/14 1655   05/10/14 1400  cefTRIAXone (ROCEPHIN) 1 g in dextrose 5 % 50 mL IVPB     1 g100 mL/hr over 30 Minutes Intravenous  Once 05/10/14 1356 05/10/14 1432   05/10/14 1400  azithromycin (ZITHROMAX) 500 mg in dextrose 5 % 250 mL IVPB     500 mg250 mL/hr over 60 Minutes Intravenous  Once 05/10/14 1356 05/10/14 1629         Objective: Filed Weights   05/18/14 0641 05/20/14 0721 05/22/14 0545  Weight: 70.3 kg (154 lb 15.7 oz) 66.497 kg (146 lb 9.6 oz) 65.409 kg (144 lb 3.2 oz)    Intake/Output Summary (Last 24 hours) at 05/22/14 0929 Last data filed at 05/22/14 0912  Gross per 24 hour  Intake  [id number] ml  Output   1150 ml  Net  29585 ml     Vitals Filed Vitals:   05/21/14 1415 05/21/14 1437  05/21/14 2116 05/22/14 0545  BP:  122/65 134/51 134/71  Pulse: 91 84 70 66  Temp:   98.3 F (36.8 C) 97.5 F (36.4 C)  TempSrc:   Oral Oral  Resp:  18 18 18   Height:      Weight:    65.409 kg (144 lb 3.2 oz)  SpO2: 97% 99% 96% 98%    Exam: General: Awake alert oriented 3, No acute distress Lungs: CTA b/l  Cardiovascular: Regular rate and rhythm without murmur gallop or rub normal S1 and S2 Abdomen: Nontender, nondistended, soft, bowel sounds positive, no rebound, no ascites, no appreciable mass Extremities: No significant cyanosis, clubbing, or edema bilateral lower extremities  Data Reviewed: Basic Metabolic Panel:  Recent Labs Lab 05/17/14 0425 05/18/14 0458 05/19/14 0510 05/20/14 0510 05/21/14 0521  NA 133* 134* 134* 139 137  K 4.5 4.2 4.2 4.7 4.4  CL 95* 96 99 102 102  CO2 20 20 21 22 23   GLUCOSE 76 94 95 82 106*  BUN 95* 88* 78*  67* 59*  CREATININE 3.12* 2.98* 2.51* 2.18* 1.92*  CALCIUM 8.3* 8.2* 7.9* 7.9* 8.1*   Liver Function Tests:  Recent Labs Lab 05/16/14 0530 05/17/14 0425 05/21/14 0521  AST 112* 67* 20  ALT 144* 116* 43  ALKPHOS 272* 261* 267*  BILITOT 0.9 0.9 0.9  PROT 6.4 6.1 6.2  ALBUMIN 2.5* 2.3* 2.4*   No results for input(s): LIPASE, AMYLASE in the last 168 hours. No results for input(s): AMMONIA in the last 168 hours. CBC:  Recent Labs Lab 05/17/14 0425 05/18/14 0458 05/19/14 0510 05/20/14 0510 05/21/14 0521  WBC 11.0* 13.9* 15.8* 17.1* 14.5*  HGB 10.2* 10.2* 10.4* 11.6* 11.5*  HCT 31.0* 30.7* 31.4* 35.9* 35.1*  MCV 82.0 81.9 84.6 85.7 85.0  PLT 201 186 190 214 191   Cardiac Enzymes: No results for input(s): CKTOTAL, CKMB, CKMBINDEX, TROPONINI in the last 168 hours. BNP (last 3 results)  Recent Labs  06/05/13 0838 07/29/13 0818 05/10/14 0935  PROBNP 17824.0* 3171.0* 63888.0*   CBG: No results for input(s): GLUCAP in the last 168 hours.  No results found for this or any previous visit (from the past 240 hour(s)).    Studies:  Recent x-ray studies have been reviewed in detail by the Attending Physician  Scheduled Meds:  Scheduled Meds: . dextromethorphan-guaiFENesin  1 tablet Oral BID  . docusate sodium  100 mg Oral BID  . feeding supplement (ENSURE COMPLETE)  237 mL Oral BID BM  . furosemide  20 mg Oral Daily  . levothyroxine  50 mcg Oral QAC breakfast  . sodium chloride  10-40 mL Intracatheter Q12H   Continuous Infusions: . sodium chloride 10 mL/hr at 05/16/14 1228    Time spent on care of this patient: 35 min   Shaliyah Taite, MD [phone] 05/22/2014, 9:29 AM  LOS: 12 days   [facility] Office  [phone] Pager - Text Page per [email or web address]  If 7PM-7AM, please contact night-coverage [email or web address]

## 2014-05-23 DIAGNOSIS — R918 Other nonspecific abnormal finding of lung field: Secondary | ICD-10-CM

## 2014-05-23 LAB — COMPREHENSIVE METABOLIC PANEL
ALT: 32 U/L (ref 0–53)
AST: 20 U/L (ref 0–37)
Albumin: 2.6 g/dL — ABNORMAL LOW (ref 3.5–5.2)
Alkaline Phosphatase: 266 U/L — ABNORMAL HIGH (ref 39–117)
Anion gap: 13 (ref 5–15)
BUN: 45 mg/dL — ABNORMAL HIGH (ref 6–23)
CALCIUM: 8.2 mg/dL — AB (ref 8.4–10.5)
CO2: 23 mEq/L (ref 19–32)
Chloride: 103 mEq/L (ref 96–112)
Creatinine, Ser: 1.68 mg/dL — ABNORMAL HIGH (ref 0.50–1.35)
GFR calc non Af Amer: 38 mL/min — ABNORMAL LOW (ref >90)
GFR, EST AFRICAN AMERICAN: 45 mL/min — AB (ref >90)
GLUCOSE: 95 mg/dL (ref 70–99)
Potassium: 4.7 mEq/L (ref 3.7–5.3)
SODIUM: 139 meq/L (ref 137–147)
Total Bilirubin: 0.8 mg/dL (ref 0.3–1.2)
Total Protein: 6.5 g/dL (ref 6.0–8.3)

## 2014-05-23 LAB — PROCALCITONIN: Procalcitonin: 0.47 ng/mL

## 2014-05-23 LAB — CBC
HCT: 35.7 % — ABNORMAL LOW (ref 39.0–52.0)
Hemoglobin: 11.4 g/dL — ABNORMAL LOW (ref 13.0–17.0)
MCH: 26.8 pg (ref 26.0–34.0)
MCHC: 31.9 g/dL (ref 30.0–36.0)
MCV: 84 fL (ref 78.0–100.0)
PLATELETS: 170 10*3/uL (ref 150–400)
RBC: 4.25 MIL/uL (ref 4.22–5.81)
RDW: 16.2 % — ABNORMAL HIGH (ref 11.5–15.5)
WBC: 13 10*3/uL — ABNORMAL HIGH (ref 4.0–10.5)

## 2014-05-23 LAB — PROTIME-INR
INR: 1.29 (ref 0.00–1.49)
PROTHROMBIN TIME: 16.3 s — AB (ref 11.6–15.2)

## 2014-05-23 MED ORDER — HYDRALAZINE HCL 50 MG PO TABS
50.0000 mg | ORAL_TABLET | Freq: Three times a day (TID) | ORAL | Status: DC
  Administered 2014-05-23 – 2014-05-26 (×8): 50 mg via ORAL
  Filled 2014-05-23 (×10): qty 1

## 2014-05-23 NOTE — Progress Notes (Signed)
[facility] Progress Note   [name] ZOX:[id number] DThreasa HeadsOB: January 14, 1941 DOA: 05/10/2014 PCP: Arlan OrganMANNING, JAMES S., MD  Brief narrative: [name] is a 73 y.o. male with a past medical history of coronary artery disease status post CABG, ischemic cardiomyopathy, chronic systolic heart failure with an EF of 20-25% admitted to the hospital with shortness of breath and cough. He was diagnosed with a right lower lobe pneumonia, acute on chronic renal failure and noted to have a mildly elevated troponin of 0.56. It was suspected he had acute on chronic heart failure as well and was admitted to the step down unit for further management. He was treated with IV antibiotics and IV diuretics. Troponins were noted to peak at 3.63. Patient was treated with pneumonia, has been complaining of hemoptysis during his hospital stay which was thought to be secondary to supratherapeutic INR ,He completed 10 days of antibiotics (cefepime from 11/14 to 11/18, then was transitioned to oral levaquin). His hemoptysis has improved some with treating his PNA and holding his coumadin, but had not resolved as of 11/27, therefore a CT chest was obtained in effort to further evaluate. The CT findings showed dense RLL consolidation w/ air bronchograms c/w PNA, sm to mod right effusion and three nodules on the left w/ the largest 1.6 cm in diameter. PCCM were consulted.   Subjective: According to the patient and the nurse, hemoptysis is improving. Sputum is now blood-tinged rather than overtly bloody.  Assessment/Plan: Principal Problem:   CAP / sepsis -Right lower lobe pneumonia with hemoptysis- see below in regards to discontinuing Coumadin -Cefepime initiated on 11/14 and transitioned to levofloxacin on 11/18 - completed a 10 day course - repeat CXR reveals resolution of pneumonia-  - CT chest showing right lower lobe pneumonia, and left-sided pulmonary nodules  -Pulmonary consult appreciated, recommendation  is to repeat chest x-ray on 4-6 weeks off over the resolution of pneumonia.  Pulmonary nodules -Pulmonary consult appreciated, patient will follow with them as an outpatient, and plan is for repeat CT versus PET scan as an outpatient.    Acute on chronic systolic CHF (congestive heart failure), NYHA class 4 -EF 20-25%-has been diuresed sufficiently and diuretics were held for a few days due to acute renal failure and volume depletion -Cardiology has been assisting with management - CXR 11/23 reveals mild pulm edema therefore, I resumed Lasix on 11/23 - remains asymptomatic and is able to ambulate down the hall without dyspnea or hypoxia - continue to monitor fluid status carefully  Leukocytosis - source uncertain- pneumonia does not appear to be recurring but will need to watch symptoms- may be aspirating?- monitor for now. - checked UA was negative for infection- monitor for fevers- continue to follow daily WBC counts  - Remains afebrile  Coagulopathy/ hemoptysis - Has been on Coumadin apparently for peripheral vascular disease as started by Dr. Jens Somrenshaw many years ago -INR was elevated at 8.53-the patient was given vitamin K-INR steadily improving and today is less than 2 therefore, -  -Coumadin resumed , cardiology, but hemoptysis continued and therefore I discontinued the following day  - Do not plan to resume until hemoptysis has resolved completely.      AKI/ Orthostatic hypotension  -Due to diuresis and improved after holding diuretics- resumed Lasix 11/23 -Continues to improve    Demand ischemia -Max troponin was 3.63 and is now improving    Transaminitis -Suspecting shock liver due to sepsis, hypotension and decreased cardiac output secondary to heart failure -Statin is  on hold - repeat LFTs reveal improved AST and ALT but continues to have an elevated alkaline phosphatase-CT abdomen pelvis on admission does not reveal any abnormalities in the liver and gallbladder-  elevated alkaline phosphatase may be secondary to vitamin D deficiency- vitamin D levels can be ordered as outpatient alkaline phosphatase continues to be elevated   Essential hypertension -Resume hydralazine 05/23/14    CAD, ARTERY BYPASS GRAFT    Severe Peripheral vascular disease -Coumadin to be held for now- see above  Code Status: Full code Family Communication: No family at bedside. Disposition Plan: To skilled nursing facility for rehabilitation DVT prophylaxis: Coumadin held-  Will place SCDs  Consultants: Cardiology Pulmonary  Procedures: None   Antibiotics: Anti-infectives    Start     Dose/Rate Route Frequency Ordered Stop   05/16/14 1800  levofloxacin (LEVAQUIN) IVPB 500 mg  Status:  Discontinued     500 mg100 mL/hr over 60 Minutes Intravenous Every 48 hours 05/14/14 1905 05/19/14 1128   05/15/14 2200  vancomycin (VANCOCIN) IVPB 1000 mg/200 mL premix  Status:  Discontinued     1,000 mg200 mL/hr over 60 Minutes Intravenous Every 48 hours 05/14/14 1106 05/14/14 1840   05/14/14 1915  levofloxacin (LEVAQUIN) IVPB 750 mg     750 mg100 mL/hr over 90 Minutes Intravenous  Once 05/14/14 1905 05/14/14 2149   05/12/14 1100  vancomycin (VANCOCIN) IVPB 750 mg/150 ml premix     750 mg150 mL/hr over 60 Minutes Intravenous Every 24 hours 05/11/14 1038 05/14/14 1211   05/11/14 1500  vancomycin (VANCOCIN) IVPB 750 mg/150 ml premix  Status:  Discontinued     750 mg150 mL/hr over 60 Minutes Intravenous Every 24 hours 05/10/14 1430 05/10/14 1655   05/11/14 1045  vancomycin (VANCOCIN) IVPB 1000 mg/200 mL premix     1,000 mg200 mL/hr over 60 Minutes Intravenous NOW 05/11/14 1038 05/11/14 1602   05/10/14 1700  ceFEPIme (MAXIPIME) 1 g in dextrose 5 % 50 mL IVPB  Status:  Discontinued     1 g100 mL/hr over 30 Minutes Intravenous Every 24 hours 05/10/14 1641 05/14/14 1845   05/10/14 1430  vancomycin (VANCOCIN) IVPB 1000 mg/200 mL premix  Status:  Discontinued     1,000 mg200 mL/hr over 60  Minutes Intravenous NOW 05/10/14 1430 05/10/14 1655   05/10/14 1400  cefTRIAXone (ROCEPHIN) 1 g in dextrose 5 % 50 mL IVPB     1 g100 mL/hr over 30 Minutes Intravenous  Once 05/10/14 1356 05/10/14 1432   05/10/14 1400  azithromycin (ZITHROMAX) 500 mg in dextrose 5 % 250 mL IVPB     500 mg250 mL/hr over 60 Minutes Intravenous  Once 05/10/14 1356 05/10/14 1629         Objective: Filed Weights   05/20/14 0721 05/22/14 0545 05/23/14 0643  Weight: 66.497 kg (146 lb 9.6 oz) 65.409 kg (144 lb 3.2 oz) 65.409 kg (144 lb 3.2 oz)    Intake/Output Summary (Last 24 hours) at 05/23/14 1711 Last data filed at 05/23/14 1240  Gross per 24 hour  Intake    930 ml  Output   1875 ml  Net   -945 ml     Vitals Filed Vitals:   05/22/14 1415 05/22/14 2034 05/23/14 0643 05/23/14 1604  BP: 154/59 155/70 127/64 140/86  Pulse: 70 72 85 71  Temp: 97.7 F (36.5 C) 97.6 F (36.4 C) 97.5 F (36.4 C) 97.6 F (36.4 C)  TempSrc: Oral Oral Oral Oral  Resp: 18 18 18 18   Height:  Weight:   65.409 kg (144 lb 3.2 oz)   SpO2: 99% 100% 100% 96%    Exam: General: Awake alert oriented 3, No acute distress Lungs: CTA b/l  Cardiovascular: Regular rate and rhythm without murmur gallop or rub normal S1 and S2 Abdomen: Nontender, nondistended, soft, bowel sounds positive, no rebound, no ascites, no appreciable mass Extremities: No significant cyanosis, clubbing, or edema bilateral lower extremities  Data Reviewed: Basic Metabolic Panel:  Recent Labs Lab 05/18/14 0458 05/19/14 0510 05/20/14 0510 05/21/14 0521 05/23/14 0504  NA 134* 134* 139 137 139  K 4.2 4.2 4.7 4.4 4.7  CL 96 99 102 102 103  CO2 20 21 22 23 23   GLUCOSE 94 95 82 106* 95  BUN 88* 78* 67* 59* 45*  CREATININE 2.98* 2.51* 2.18* 1.92* 1.68*  CALCIUM 8.2* 7.9* 7.9* 8.1* 8.2*   Liver Function Tests:  Recent Labs Lab 05/17/14 0425 05/21/14 0521 05/23/14 0504  AST 67* 20 20  ALT 116* 43 32  ALKPHOS 261* 267* 266*  BILITOT 0.9  0.9 0.8  PROT 6.1 6.2 6.5  ALBUMIN 2.3* 2.4* 2.6*   No results for input(s): LIPASE, AMYLASE in the last 168 hours. No results for input(s): AMMONIA in the last 168 hours. CBC:  Recent Labs Lab 05/18/14 0458 05/19/14 0510 05/20/14 0510 05/21/14 0521 05/23/14 0504  WBC 13.9* 15.8* 17.1* 14.5* 13.0*  HGB 10.2* 10.4* 11.6* 11.5* 11.4*  HCT 30.7* 31.4* 35.9* 35.1* 35.7*  MCV 81.9 84.6 85.7 85.0 84.0  PLT 186 190 214 191 170   Cardiac Enzymes: No results for input(s): CKTOTAL, CKMB, CKMBINDEX, TROPONINI in the last 168 hours. BNP (last 3 results)  Recent Labs  06/05/13 0838 07/29/13 0818 05/10/14 0935  PROBNP 17824.0* 3171.0* 63888.0*   CBG: No results for input(s): GLUCAP in the last 168 hours.  No results found for this or any previous visit (from the past 240 hour(s)).   Studies:  Recent x-ray studies have been reviewed in detail by the Attending Physician  Scheduled Meds:  Scheduled Meds: . dextromethorphan-guaiFENesin  1 tablet Oral BID  . docusate sodium  100 mg Oral BID  . feeding supplement (ENSURE COMPLETE)  237 mL Oral BID BM  . furosemide  20 mg Oral Daily  . levothyroxine  50 mcg Oral QAC breakfast  . sodium chloride  10-40 mL Intracatheter Q12H   Continuous Infusions: . sodium chloride 10 mL/hr at 05/16/14 1228    Time spent on care of this patient: 30 min   Jermine Bibbee, MD [phone] 05/23/2014, 5:11 PM  LOS: 13 days   [facility] Office  [phone] Pager - Text Page per [email or web address]  If 7PM-7AM, please contact night-coverage [email or web address]

## 2014-05-23 NOTE — Progress Notes (Signed)
Physical Therapy Treatment Patient Details Name: Clinton HeadsCharles W Millikan MRN: [id number] DOB: 04-10-1941 Today's Date: 05/23/2014    History of Present Illness Patient with complex medical history presents with sepsis and work up revealing Community-acquired pneumonia.     PT Comments    Pt making steady progress.  Follow Up Recommendations  SNF     Equipment Recommendations  Rolling walker with 5" wheels    Recommendations for Other Services       Precautions / Restrictions Precautions Precautions: Fall    Mobility  Bed Mobility Overal bed mobility: Modified Independent Bed Mobility: Sit to Supine       Sit to supine: Modified independent (Device/Increase time)      Transfers Overall transfer level: Needs assistance Equipment used: Rolling walker (2 wheeled) Transfers: Sit to/from Stand Sit to Stand: Min guard;Min assist         General transfer comment: Assist to bring hips up from low commode. Assist for safety from chair.  Ambulation/Gait Ambulation/Gait assistance: Supervision Ambulation Distance (Feet): 200 Feet Assistive device: Rolling walker (2 wheeled) Gait Pattern/deviations: Step-through pattern;Decreased stride length Gait velocity: decreased Gait velocity interpretation: Below normal speed for age/gender General Gait Details: verbal cues to stand more erect   Stairs            Wheelchair Mobility    Modified Rankin (Stroke Patients Only)       Balance   Sitting-balance support: No upper extremity supported;Feet supported Sitting balance-Leahy Scale: Good     Standing balance support: No upper extremity supported Standing balance-Leahy Scale: Fair                      Cognition Arousal/Alertness: Awake/alert Behavior During Therapy: Flat affect Overall Cognitive Status: Within Functional Limits for tasks assessed                      Exercises      General Comments        Pertinent Vitals/Pain Pain  Assessment: No/denies pain    Home Living                      Prior Function            PT Goals (current goals can now be found in the care plan section) Progress towards PT goals: Progressing toward goals    Frequency  Min 3X/week    PT Plan Current plan remains appropriate    Co-evaluation             End of Session Equipment Utilized During Treatment: Gait belt Activity Tolerance: Patient tolerated treatment well Patient left: in bed;with call bell/phone within reach     Time: [id number] PT Time Calculation (min) (ACUTE ONLY): 31 min  Charges:  $Gait Training: 23-37 mins                    G Codes:      Cy Bresee 05/23/2014, 9:28 AM  Skip Mayerary Moreen Piggott PT [phone]

## 2014-05-23 NOTE — Consult Note (Signed)
Name: Clinton [name] MRN: [id number] DOB: December 05, 1940    ADMISSION DATE:  05/10/2014 CONSULTATION DATE:  11/27  REFERRING MD :  Elgergawy  CHIEF COMPLAINT:  Pulmonary nodules. Slow to resolve pneumonia    HISTORY OF PRESENT ILLNESS:   73 y.o. male with a past medical history of coronary artery disease status post CABG, ischemic cardiomyopathy, chronic systolic heart failure with an EF of 20-25% admitted to the hospital on 11/14 with shortness of breath and cough w/ hemoptysis and INR of 8.53 . He was diagnosed with a right lower lobe pneumonia, acute on chronic renal failure and noted to have a mildly elevated troponin of 0.56. It was suspected he had acute on chronic heart failure as well and was admitted to the step down unit for further management. He was treated with IV antibiotics and IV diuretics. Troponins were noted to peak at 3.63.He completed 10 days of antibiotics (cefepime from 11/14 to 11/18, then was transitioned to oral levaquin). His hemoptysis has improved some with treating his PNA and holding his coumadin, but had not resolved as of 11/26, therefore a CT chest was obtained in effort to further evaluate. The CT findings showed dense RLL consolidation w/ air bronchograms c/w PNA, sm to mod right effusion and three nodules on the left w/ the largest 1.6 cm in diameter. PCCM has been asked to see re: his CT findings.   STUDIES:  CT chest 11/26: . Right lower lobe pneumonia or pneumonitis with moderate associated effusion 2. Three pulmonary nodules on the left, the largest measuring 16 mm. Mildly enlarged right mediastinal lymph node    PAST MEDICAL HISTORY :   has a past medical history of Peripheral arterial disease; Carotid artery occlusion; CAD (coronary artery disease); Cardiomyopathy, ischemic; Stroke; Hypertension; Hyperlipidemia; Tobacco abuse; Anticoagulated on Coumadin; CKD (chronic kidney disease), stage III; Chronic systolic CHF (congestive heart  failure); Moderate aortic stenosis; and Hypothyroidism.  has past surgical history that includes Carotid endarterectomy; Coronary artery bypass graft; vein bypass graft,aorto-fem-pop; and ureteral artery renal bypass.    Prior to Admission medications   Medication Sig Start Date End Date Taking? Authorizing Provider  carvedilol (COREG) 12.5 MG tablet Take 1.5 tablets (18.75 mg total) by mouth 2 (two) times daily. 07/29/13  Yes Lewayne BuntingBrian S Crenshaw, MD  ezetimibe (ZETIA) 10 MG tablet Take 10 mg by mouth daily.     Yes Historical Provider, MD  furosemide (LASIX) 20 MG tablet Take 1 tablet (20 mg total) by mouth daily. 01/17/14  Yes Lewayne BuntingBrian S Crenshaw, MD  hydrALAZINE (APRESOLINE) 50 MG tablet Take 1 tablet (50 mg total) by mouth 3 (three) times daily. 05/01/14  Yes Lewayne BuntingBrian S Crenshaw, MD  isosorbide mononitrate (IMDUR) 30 MG 24 hr tablet Take 1 tablet (30 mg total) by mouth daily. 02/17/14  Yes Lewayne BuntingBrian S Crenshaw, MD  levothyroxine (SYNTHROID, LEVOTHROID) 50 MCG tablet Take 1 tablet (50 mcg total) by mouth daily before breakfast. 02/25/14  Yes Lewayne BuntingBrian S Crenshaw, MD  losartan (COZAAR) 100 MG tablet Take 100 mg by mouth daily.     Yes Historical Provider, MD  rosuvastatin (CRESTOR) 40 MG tablet Take 1 tablet (40 mg total) by mouth daily. 06/19/13  Yes Rosalio MacadamiaLori C Gerhardt, NP  warfarin (COUMADIN) 6 MG tablet Take 3-6 mg by mouth daily. Take 3 mg on tue and sat and 6 mg all other days   Yes Historical Provider, MD   Allergies  Allergen Reactions  . Codeine Phosphate Nausea And Vomiting    FAMILY  HISTORY:  family history includes CAD in an other family member.  No family h/o cancers  SOCIAL HISTORY:  reports that he quit smoking about a month ago. His smoking use included Cigarettes. He has a 5 pack-year smoking history. He has never used smokeless tobacco. He reports that he does not drink alcohol or use illicit drugs. Retired from [employer]. Did not have significant exposure to dusts or chemicals that he recalls.    REVIEW OF SYSTEMS (bolds are positive):   Constitutional: Negative for fever, chills, weight loss, malaise/fatigue and diaphoresis this has all improved.  HENT: Negative for hearing loss, ear pain, nosebleeds, congestion, sore throat, neck pain, tinnitus and ear discharge.   Eyes: Negative for blurred vision, double vision, photophobia, pain, discharge and redness.  Respiratory: Negative for cough, streaky hemoptysis, sputum production w/ yellow sputum, shortness of breath, wheezing and stridor.   Cardiovascular: Negative for chest pain, palpitations, orthopnea, claudication, leg swelling and PND.  Gastrointestinal: Negative for heartburn, nausea, vomiting, abdominal pain, diarrhea, constipation, blood in stool and melena.  Genitourinary: Negative for dysuria, urgency, frequency, hematuria and flank pain.  Musculoskeletal: Negative for myalgias, back pain, joint pain and falls.  Skin: Negative for itching and rash.  Neurological: Negative for dizziness, tingling, tremors, sensory change, speech change, focal weakness, seizures, loss of consciousness, weakness and headaches.  Endo/Heme/Allergies: Negative for environmental allergies and polydipsia. Does not bruise/bleed easily.  SUBJECTIVE:  Feels better  VITAL SIGNS: Temp:  [97.5 F (36.4 C)-97.7 F (36.5 C)] 97.5 F (36.4 C) (11/27 0643) Pulse Rate:  [70-85] 85 (11/27 0643) Resp:  [18] 18 (11/27 0643) BP: (127-155)/(59-70) 127/64 mmHg (11/27 0643) SpO2:  [99 %-100 %] 100 % (11/27 0643) Weight:  [65.409 kg (144 lb 3.2 oz)] 65.409 kg (144 lb 3.2 oz) (11/27 [id number])  PHYSICAL EXAMINATION: General:  73 year old WM sitting up in chair. In no acute distress.  Neuro:  Alert and oriented. No gross focal def  HEENT:  [place], no JVd  Cardiovascular:  Reg, irreg  Lungs:  Rales posterior bases. Decreased breath sounds on right  Abdomen:  Soft, non-tender, no OM + bowel sounds  Musculoskeletal:  Intact  Skin:  Intact    Recent Labs Lab  05/20/14 0510 05/21/14 0521 05/23/14 0504  NA 139 137 139  K 4.7 4.4 4.7  CL 102 102 103  CO2 22 23 23   BUN 67* 59* 45*  CREATININE 2.18* 1.92* 1.68*  GLUCOSE 82 106* 95    Recent Labs Lab 05/20/14 0510 05/21/14 0521 05/23/14 0504  HGB 11.6* 11.5* 11.4*  HCT 35.9* 35.1* 35.7*  WBC 17.1* 14.5* 13.0*  PLT 214 191 170   Lab Results  Component Value Date   INR 1.29 05/23/2014   INR 1.41 05/22/2014   INR 1.53* 05/21/2014   Ct Chest Wo Contrast  05/22/2014   CLINICAL DATA:  Elevated creatinine, hemoptysis, initial evaluation  EXAM: CT CHEST WITHOUT CONTRAST  TECHNIQUE: Multidetector CT imaging of the chest was performed following the standard protocol without IV contrast.  COMPARISON:  CT abdomen pelvis performed 05/10/2014  FINDINGS: There is heavy calcification of the thoracic aorta. There is moderate cardiac enlargement.  There is a moderate right pleural effusion much of which is subpulmonic. There is dense consolidation in the posterior right lower lobe with air bronchograms. No mass is seen on the right.  On the left, there is mild dependent atelectasis. There is a small left pleural effusion. Laterally in the left lower lobe there is 16  mm ground-glass attenuation nodule. Just cranial to this is a 6 mm peripheral pulmonary nodule. Just cranial to this is another pulmonary nodule in the central left lower lobe measuring 5 mm.  There is mild centrilobular emphysema in the upper lung zones. There are small mediastinal lymph nodes it. Additionally there is a 13 mm right precarinal lymph node. There are no acute findings seen on images through the upper abdomen.  IMPRESSION: 1. Right lower lobe pneumonia or pneumonitis with moderate associated effusion 2. Three pulmonary nodules on the left, the largest measuring 16 mm. Malignancy is not excluded. PET-CT could be considered. 3. Mildly enlarged right mediastinal lymph node of uncertain origin, with numerous nonpathologic lymph nodes also  identified. Small left pleural effusion.   Electronically Signed   By: Esperanza Heir M.D.   On: 05/22/2014 11:13    ASSESSMENT / PLAN:  Hemoptysis (improved since holding coumadin but persists in spite of sub-therapeutic INR) RLL CAP (NOS) Probable Right parapneumonic pleural effusion Very small left effusion  Pulmonary Nodules: three noted on left. Largest 1.6 cm w/ a 6mm and 5 mm nodule located just above the largest  ICM w/ EF 20-25% CAF Supra-therapeutic INR (was > 8, now sub-therapeutic) Mild anemia  CKD w/ Acute on chronic renal failure in setting of hypotension. Now improving, (baseline Scr 1.6 to 2)  Discussion  He continues to have hemoptysis in spite of stopping coumadin and INR drifting down for > 8 to 1.29. Clinically he seems to have responded to antibiotics, however it is concerning he continues to have hemoptysis.  Plan/rec  Send PCT  Will discuss w/ Dr Vassie Loll: possible options: wait and repeat CT in 4-6 weeks vs bronchoscopy vs EBUS... would likely require this if we were to consider sampling the Largest left nodule. The persistence of hemoptysis 2 weeks is concerning.    Anders Simmonds ACNP-BC [facility] Pulmonary/Critical Care Pager # [phone] OR # [phone] if no answer    05/23/2014, 11:58 AM  ATTENDING NOTE: I have personally reviewed patient's available data, including medical history, events of note, physical examination and test results as part of my evaluation. I have discussed with resident/NP and other careteam providers such as pharmacist, RN and RRT & co-ordinated with consultants. In addition, I personally evaluated patient and elicited key history of admission 11/14 with shortness of breath, cough and hemoptysis, right lower lobe consolidation treated with antibiotics, exam findings of sitting out of bed in no distress, decreased breath sounds with crackles right lower lobe, clear lungs and left & labs showing initial INR of 8.5 now down to 1.3, initial WBC  count of 17 now down to 13k.  CT chest was reviewed.  Given clinical improvement, there is no need for inspection bronchoscopy. The hemoptysis is likely related to pneumonia and supratherapeutic INR, and is slowly resolving. Would follow up serial chest x-rays for resolution of pneumonia in 4-6 weeks, and then consider repeat CT scan versus PET scan to follow-up nodules. I do not feel the need for thoracenteses at the current time, since fluid is minimal. Please call [phone] to arrange follow-up with Rubye Oaks, NP in 2 weeks at the time of discharge [place] per NP/medical resident whose note is outlined above and that I agree with and edited in full.    [facility] M to sign off   Oretha Milch. MD

## 2014-05-23 NOTE — Clinical Social Work Note (Signed)
Spoke to case manager, patient is not ready for discharge today, possibly on Monday.  [facility] [address] [facility].  Ervin KnackEric R. Florestine Carmical, MSW, Theresia MajorsLCSWA [phone] 05/23/2014 5:32 PM

## 2014-05-24 LAB — BASIC METABOLIC PANEL
Anion gap: 12 (ref 5–15)
BUN: 40 mg/dL — ABNORMAL HIGH (ref 6–23)
CO2: 23 mEq/L (ref 19–32)
Calcium: 8.3 mg/dL — ABNORMAL LOW (ref 8.4–10.5)
Chloride: 102 mEq/L (ref 96–112)
Creatinine, Ser: 1.5 mg/dL — ABNORMAL HIGH (ref 0.50–1.35)
GFR, EST AFRICAN AMERICAN: 51 mL/min — AB (ref >90)
GFR, EST NON AFRICAN AMERICAN: 44 mL/min — AB (ref >90)
GLUCOSE: 80 mg/dL (ref 70–99)
Potassium: 4.7 mEq/L (ref 3.7–5.3)
SODIUM: 137 meq/L (ref 137–147)

## 2014-05-24 LAB — CBC
HCT: 35.8 % — ABNORMAL LOW (ref 39.0–52.0)
HEMOGLOBIN: 11.5 g/dL — AB (ref 13.0–17.0)
MCH: 27.6 pg (ref 26.0–34.0)
MCHC: 32.1 g/dL (ref 30.0–36.0)
MCV: 86.1 fL (ref 78.0–100.0)
Platelets: 145 10*3/uL — ABNORMAL LOW (ref 150–400)
RBC: 4.16 MIL/uL — ABNORMAL LOW (ref 4.22–5.81)
RDW: 16.2 % — AB (ref 11.5–15.5)
WBC: 11 10*3/uL — ABNORMAL HIGH (ref 4.0–10.5)

## 2014-05-24 LAB — PROTIME-INR
INR: 1.27 (ref 0.00–1.49)
Prothrombin Time: 16 seconds — ABNORMAL HIGH (ref 11.6–15.2)

## 2014-05-24 MED ORDER — DM-GUAIFENESIN ER 30-600 MG PO TB12: 1.0000 | ORAL_TABLET | Freq: Two times a day (BID) | ORAL | Status: AC

## 2014-05-24 MED ORDER — ENSURE COMPLETE PO LIQD: 237.0000 mL | Freq: Two times a day (BID) | ORAL | Status: DC

## 2014-05-24 NOTE — Discharge Summary (Addendum)
Clinton [name], 73 y.o., DOB [birth year], MRN [id number]. Admission date: 05/10/2014 Discharge Date 05/26/2014 Primary MD MANNING, Adelene Amas., MD Admitting Physician Jeralyn Bennett, MD    patient is being discharged on 05/26/14, no significant update on discharge summary since dictated note on 05/24/14, main significant change is pulmonary appointment has already point scheduled on  06/10/14 at 2:30 PM.  Admission Diagnosis  CAP (community acquired pneumonia) [J18.9] Acute on chronic renal failure [N17.9, N18.9] Acute on chronic systolic congestive heart failure [I50.23] Abdominal pain [R10.9]  Discharge Diagnosis   Principal Problem:   CAP (community acquired pneumonia) Active Problems:   Essential hypertension   CAD, ARTERY BYPASS GRAFT   Peripheral vascular disease   Acute on chronic systolic CHF (congestive heart failure), NYHA class 4   AKI (acute kidney injury)   Sepsis   PNA (pneumonia)   Demand ischemia   Sepsis due to other etiology   Transaminitis   Orthostatic hypotension   Acute on chronic systolic congestive heart failure  Primary care physician/skilled nursing facility physician please follow on these: -Follow BMP in 3 days, if remains stable and continues to improve may resume back on losartan. -Please follow on hematemesis in 7-10 days, if totally resolved, resume back on anticoagulation for A. Fib. -Patient will make to follow with lobar pulmonary service in 2 weeks from discharge regarding pulmonary nodules.  Past Medical History  Diagnosis Date  . Peripheral arterial disease     a. Ao-bifem bypass w/ 16x8 hemashield dacron graft, R aortorenal bypass w/ 6mm dacron graft;  b. 09/2011 ABI: R 0/87, L 0.92.  . Carotid artery occlusion     a. 12/2002 R CEA;  b. 05/2007 L Carotid stenting;  c. 09/2008 repeat L carotid stenting 2/2 ISR;  d. 09/2011 Carotid U/S: RICA 40-59%, LICA 60-79%, >50 dist LCCA stenosis.  Marland Kitchen CAD (coronary artery disease)     a. CABG 1985;  b.  05/2002 Rotablator/PTCA to ostial LCX and RI;  c. 12/2009 MV: inf and lat infarct w/ minimal mid lateral and basilar ant ischemia, EF 29%->Med Rx.  . Cardiomyopathy, ischemic     a. 06/2010 Cardiac MRI: EF 30%, anterolat, post, inf prior subendocardial infarcts;  b. 08/2012 Echo: EF 30-35%, mild LVH mult wma's, Gr 2 DD;  c. 05/2013 Echo: EF 20-25%, diff HK, mod AS, mild MR, mod dil LA, mild to mod TR, PASP .  . Stroke     remote  . Hypertension   . Hyperlipidemia   . Tobacco abuse   . Anticoagulated on Coumadin   . CKD (chronic kidney disease), stage III   . Chronic systolic CHF (congestive heart failure)     a. 05/2013 EF 20-25%, diff HK.  . Moderate aortic stenosis     a. 05/2013 Echo: at least moderate AS (Valve area 1.07 cm2 - VTI; 1.08cm2 - Vmax).  . Hypothyroidism     a. 05/2013 TSH 50.15 - synthroid 50 mcg daily started.    Past Surgical History  Procedure Laterality Date  . Carotid endarterectomy    . Coronary artery bypass graft    . Pr vein bypass graft,aorto-fem-pop    . Ureteral artery renal bypass     Admission history of present illness/brief narrative: Clinton [name] is a 73 y.o. male with a past medical history of coronary artery disease status post CABG, ischemic cardiomyopathy, chronic systolic heart failure with an EF of 20-25% admitted to the hospital with shortness of breath and cough. He was diagnosed with a  right lower lobe pneumonia, acute on chronic renal failure and noted to have a mildly elevated troponin of 0.56. It was suspected he had acute on chronic heart failure as well and was admitted to the step down unit for further management. He was treated with IV antibiotics and IV diuretics. Troponins were noted to peak at 3.63. Patient was treated with pneumonia, has been complaining of hemoptysis during his hospital stay which was thought to be secondary to supratherapeutic INR ,He completed 10 days of antibiotics (cefepime from 11/14 to 11/18, then was  transitioned to oral levaquin). His hemoptysis has improved some with treating his PNA and holding his coumadin, but had not resolved as of 11/27, therefore a CT chest was obtained in effort to further evaluate. The CT findings showed dense RLL consolidation w/ air bronchograms c/w PNA, sm to mod right effusion and three nodules on the left w/ the largest 1.6 cm in diameter. PCCM were consulted, and recommendation was made for outpatient follow-up in 2 weeks for further evaluation for pulmonary nodule and possible repeat CT scan versus PET scan as an outpatient.  Hospital Course See H&P, Labs, Consult and Test reports for all details in brief, patient was admitted for   Principal Problem:   CAP (community acquired pneumonia) Active Problems:   Essential hypertension   CAD, ARTERY BYPASS GRAFT   Peripheral vascular disease   Acute on chronic systolic CHF (congestive heart failure), NYHA class 4   AKI (acute kidney injury)   Sepsis   PNA (pneumonia)   Demand ischemia   Sepsis due to other etiology   Transaminitis   Orthostatic hypotension   Acute on chronic systolic congestive heart failure  CAP / sepsis -Right lower lobe pneumonia with hemoptysis- see below in regards to discontinuing Coumadin -Cefepime initiated on 11/14 and transitioned to levofloxacin on 11/18 - completed a 10 day course - repeat CXR reveals resolution of pneumonia-  - CT chest showing right lower lobe pneumonia, and left-sided pulmonary nodules  -Pulmonary consulted, recommendation is to repeat chest x-ray on 4-6 weeks for the resolution of pneumonia.  Pulmonary nodules -Pulmonary consulted appreciated, patient will follow with them as an outpatient, and plan is for repeat CT versus PET scan as an outpatient.    Acute on chronic systolic CHF (congestive heart failure), NYHA class 4 -EF 20-25%-has been diuresed sufficiently and diuretics were held for a few days due to acute renal failure and volume depletion -  CXR 11/23 reveals mild pulm edema therefore, I resumed Lasix on 11/23 - remains asymptomatic and is able to ambulate down the hall without dyspnea or hypoxia - Does not appear to be in any volume overload on discharge ,will continue with Lasix on discharge.  Leukocytosis - Continues to improve, white count is 11,000 at day of discharge - UA was negative for infection - Remains afebrile  Coagulopathy/ hemoptysis - Has been on Coumadin apparently for peripheral vascular disease as started by Dr. Jens Som many years ago -INR was elevated at 8.53 on admission ,the patient was given vitamin K-INR.  Hemoptysis -Felt to be secondary to pneumonia, and elevated INR. -Much improved by the time of discharge, but still present, so recommendation was made to hold warfarin on discharge, still hemoptysis totally resolves.   AKI/ Orthostatic hypotension  -Due to diuresis and improved after holding diuretics- resumed Lasix 11/23 -Continues to improve, creatinine is 1.5 by day of discharge. -No further episodes of hypotension, but pressure has been stable. -Resume losartan if creatinine  returns to normal on repeat BMP.   Demand ischemia -Max troponin was 3.63 and is now improving   Transaminitis -Suspecting shock liver due to sepsis, hypotension and decreased cardiac output secondary to heart failure -Statin is on hold - repeat LFTs reveal improved AST and ALT but continues to have an elevated alkaline phosphatase-CT abdomen pelvis on admission does not reveal any abnormalities in the liver and gallbladder- elevated alkaline phosphatase may be secondary to vitamin D deficiency- vitamin D levels can be ordered as outpatient alkaline phosphatase continues to be elevated  Essential hypertension -Resume hydralazine 05/23/14   CAD, ARTERY BYPASS GRAFT   Severe Peripheral vascular disease -Coumadin to be held for now- 10 be resumed once hemoptysis is resolved.   Consults    Pulmonary Cardiology  Significant Tests:  See full reports for all details    Ct Abdomen Pelvis Wo Contrast  05/10/2014   CLINICAL DATA:  Right lower quadrant pain for 5 days  EXAM: CT ABDOMEN AND PELVIS WITHOUT CONTRAST  TECHNIQUE: Multidetector CT imaging of the abdomen and pelvis was performed following the standard protocol without IV contrast.  COMPARISON:  None.  FINDINGS: Lung bases show of the left lung base be within normal limits. Right lower lobe pneumonia with associated effusion is seen.  The liver, gallbladder, spleen, adrenal glands and pancreas are within normal limits. The kidneys are well visualized bilaterally and demonstrate renal cystic change. Some of these have attenuation greater than normal fluid and likely represent hemorrhagic cysts. No renal calculi or obstructive changes are seen. Changes consistent with aortobifemoral bypass graft are noted. The appendix is within normal limits. No definitive inflammatory changes are seen.  Bladder is partially distended. Free fluid is noted within the pelvis although no cause towards factors are seen. Small fluid containing left inguinal hernia is noted. Bony structures are within normal limits.  IMPRESSION: Right lower lobe pneumonia with associated effusion.  Free fluid within the pelvis of uncertain etiology.  Chronic changes as described.   Electronically Signed   By: Alcide Clever M.D.   On: 05/10/2014 13:35   X-ray Chest Pa And Lateral  05/11/2014   CLINICAL DATA:  Pneumonia  EXAM: CHEST  2 VIEW  COMPARISON:  05/10/2014  FINDINGS: Cardiomediastinal silhouette is stable. Persistent small right pleural effusion with right lower lobe infiltrate/pneumonia. Status post median sternotomy. Central mild bronchitic changes.  IMPRESSION: Persistent small right pleural effusion with right lower lobe infiltrate/ pneumonia. No convincing pulmonary edema. Central mild bronchitic changes.   Electronically Signed   By: Natasha Mead M.D.   On:  05/11/2014 11:31   Ct Chest Wo Contrast  05/22/2014   CLINICAL DATA:  Elevated creatinine, hemoptysis, initial evaluation  EXAM: CT CHEST WITHOUT CONTRAST  TECHNIQUE: Multidetector CT imaging of the chest was performed following the standard protocol without IV contrast.  COMPARISON:  CT abdomen pelvis performed 05/10/2014  FINDINGS: There is heavy calcification of the thoracic aorta. There is moderate cardiac enlargement.  There is a moderate right pleural effusion much of which is subpulmonic. There is dense consolidation in the posterior right lower lobe with air bronchograms. No mass is seen on the right.  On the left, there is mild dependent atelectasis. There is a small left pleural effusion. Laterally in the left lower lobe there is 16 mm ground-glass attenuation nodule. Just cranial to this is a 6 mm peripheral pulmonary nodule. Just cranial to this is another pulmonary nodule in the central left lower lobe measuring 5 mm.  There  is mild centrilobular emphysema in the upper lung zones. There are small mediastinal lymph nodes it. Additionally there is a 13 mm right precarinal lymph node. There are no acute findings seen on images through the upper abdomen.  IMPRESSION: 1. Right lower lobe pneumonia or pneumonitis with moderate associated effusion 2. Three pulmonary nodules on the left, the largest measuring 16 mm. Malignancy is not excluded. PET-CT could be considered. 3. Mildly enlarged right mediastinal lymph node of uncertain origin, with numerous nonpathologic lymph nodes also identified. Small left pleural effusion.   Electronically Signed   By: Esperanza Heir M.D.   On: 05/22/2014 11:13   Dg Chest Port 1 View  05/19/2014   CLINICAL DATA:  Hemoptysis and difficulty breathing  EXAM: PORTABLE CHEST - 1 VIEW  COMPARISON:  May 11, 2014  FINDINGS: There is now a central catheter with tip in the superior vena cava. No pneumothorax. There is underlying emphysematous change. There is persistent  right effusion with right middle and lower lobe patchy infiltrate. There is no edema or consolidation the left. Heart is enlarged with pulmonary vascularity within normal limits. Patient is status post internal mammary bypass grafting. No adenopathy. There is a stent in the left carotid region, stable.  IMPRESSION: Central catheter tip in superior vena cava. No pneumothorax. There is patchy infiltrate in portions of the right middle and lower lobes with right effusion. Underlying emphysema. Stable cardiac enlargement.   Electronically Signed   By: Bretta Bang M.D.   On: 05/19/2014 08:07   Dg Chest Port 1 View  05/10/2014   CLINICAL DATA:  Weakness.  Shortness of breath for 3 days.  Fatigue.  EXAM: PORTABLE CHEST - 1 VIEW  COMPARISON:  Two-view chest 06/05/2013.  FINDINGS: The heart is mildly enlarged. And chronic interstitial changes are similar to the prior study. This likely reflects some element of edema superimposed on chronic change. There is persistent blunting of the right costophrenic angle suggesting a small effusion or chronic pleural thickening. No focal airspace consolidation is evident.  IMPRESSION: 1. Cardiomegaly with mild edema superimposed on chronic interstitial coarsening. This likely reflects mild congestive heart failure. 2. Suspect small right pleural effusion.   Electronically Signed   By: Gennette Pac M.D.   On: 05/10/2014 10:26     Today   Subjective:   Clinton [name] today has no headache,no chest abdominal pain,no new weakness tingling or numbness, feels much better wants to go home today. This is much improved, but still minimally present.  Objective:   Blood pressure 136/55, pulse 91, temperature 97.2 F (36.2 C), temperature source Oral, resp. rate 18, height 5\' 11"  (1.803 m), weight 63.685 kg (140 lb 6.4 oz), SpO2 98 %.  Intake/Output Summary (Last 24 hours) at 05/26/14 1022 Last data filed at 05/26/14 1610  Gross per 24 hour  Intake   1260 ml  Output    1300 ml  Net    -40 ml    Exam  General: Awake alert oriented 3, No acute distress Lungs: CTA b/l  Cardiovascular: Regular rate and rhythm without murmur gallop or rub normal S1 and S2 Abdomen: Nontender, nondistended, soft, bowel sounds positive, no rebound, no ascites, no appreciable mass Extremities: No significant cyanosis, clubbing, or edema bilateral lower extremities  Anti-infectives    Start     Dose/Rate Route Frequency Ordered Stop   05/16/14 1800  levofloxacin (LEVAQUIN) IVPB 500 mg  Status:  Discontinued     500 mg100 mL/hr over 60 Minutes Intravenous Every  48 hours 05/14/14 1905 05/19/14 1128   05/15/14 2200  vancomycin (VANCOCIN) IVPB 1000 mg/200 mL premix  Status:  Discontinued     1,000 mg200 mL/hr over 60 Minutes Intravenous Every 48 hours 05/14/14 1106 05/14/14 1840   05/14/14 1915  levofloxacin (LEVAQUIN) IVPB 750 mg     750 mg100 mL/hr over 90 Minutes Intravenous  Once 05/14/14 1905 05/14/14 2149   05/12/14 1100  vancomycin (VANCOCIN) IVPB 750 mg/150 ml premix     750 mg150 mL/hr over 60 Minutes Intravenous Every 24 hours 05/11/14 1038 05/14/14 1211   05/11/14 1500  vancomycin (VANCOCIN) IVPB 750 mg/150 ml premix  Status:  Discontinued     750 mg150 mL/hr over 60 Minutes Intravenous Every 24 hours 05/10/14 1430 05/10/14 1655   05/11/14 1045  vancomycin (VANCOCIN) IVPB 1000 mg/200 mL premix     1,000 mg200 mL/hr over 60 Minutes Intravenous NOW 05/11/14 1038 05/11/14 1602   05/10/14 1700  ceFEPIme (MAXIPIME) 1 g in dextrose 5 % 50 mL IVPB  Status:  Discontinued     1 g100 mL/hr over 30 Minutes Intravenous Every 24 hours 05/10/14 1641 05/14/14 1845   05/10/14 1430  vancomycin (VANCOCIN) IVPB 1000 mg/200 mL premix  Status:  Discontinued     1,000 mg200 mL/hr over 60 Minutes Intravenous NOW 05/10/14 1430 05/10/14 1655   05/10/14 1400  cefTRIAXone (ROCEPHIN) 1 g in dextrose 5 % 50 mL IVPB     1 g100 mL/hr over 30 Minutes Intravenous  Once 05/10/14 1356 05/10/14 1432    05/10/14 1400  azithromycin (ZITHROMAX) 500 mg in dextrose 5 % 250 mL IVPB     500 mg250 mL/hr over 60 Minutes Intravenous  Once 05/10/14 1356 05/10/14 1629      Cultures -  Results for orders placed or performed during the hospital encounter of 05/10/14  Urine culture     Status: None   Collection Time: 05/10/14 10:03 AM  Result Value Ref Range Status   Specimen Description URINE, CLEAN CATCH  Final   Special Requests NONE  Final   Culture  Setup Time   Final    05/10/2014 20:50 Performed at [facility] Count NO GROWTH Performed at [facility]   Final   Culture NO GROWTH Performed at [facility]   Final   Report Status 05/11/2014 FINAL  Final  Culture, blood (routine x 2)     Status: None   Collection Time: 05/10/14  7:05 PM  Result Value Ref Range Status   Specimen Description BLOOD LEFT FOREARM  Final   Special Requests BOTTLES DRAWN AEROBIC ONLY 4CC  Final   Culture  Setup Time   Final    05/11/2014 01:38 Performed at [facility]    Culture   Final    NO GROWTH 5 DAYS Performed at [facility]    Report Status 05/17/2014 FINAL  Final  Culture, blood (routine x 2)     Status: None   Collection Time: 05/10/14  7:10 PM  Result Value Ref Range Status   Specimen Description BLOOD RIGHT HAND  Final   Special Requests BOTTLES DRAWN AEROBIC ONLY 5CC  Final   Culture  Setup Time   Final    05/11/2014 01:38 Performed at [facility]    Culture   Final    NO GROWTH 5 DAYS Performed at [facility]    Report Status 05/17/2014 FINAL  Final     CBC  w Diff:  Lab Results  Component Value Date   WBC 11.0* 05/24/2014   HGB 11.5* 05/24/2014   HCT 35.8* 05/24/2014   PLT 145* 05/24/2014   LYMPHOPCT 2* 05/13/2014   MONOPCT 4 05/13/2014   EOSPCT 0 05/13/2014   BASOPCT 0 05/13/2014   CMP:  Lab Results  Component Value Date   NA 137 05/24/2014   K 4.7 05/24/2014   CL 102 05/24/2014   CO2 23  05/24/2014   BUN 40* 05/24/2014   CREATININE 1.50* 05/24/2014   CREATININE 1.49* 07/17/2012   PROT 6.5 05/23/2014   ALBUMIN 2.6* 05/23/2014   BILITOT 0.8 05/23/2014   ALKPHOS 266* 05/23/2014   AST 20 05/23/2014   ALT 32 05/23/2014  .  Micro Results No results found for this or any previous visit (from the past 240 hour(s)).   Discharge Instructions          Follow-up Information    Follow up with MANNING, Adelene AmasJAMES S., MD. Call in 1 week.   Specialty:  Family Medicine   Contact information:   [address] [place] [address] [phone]       Follow up with Olga MillersBrian Crenshaw, MD. Call in 1 week.   Specialty:  Cardiology   Why:  Regarding decision to resume warfarin or not.   Contact information:   [address] [place] [address] [phone]       Follow up with Oretha MilchALVA,RAKESH V., MD On 06/10/2014.   Specialty:  Pulmonary Disease   Why:  Dr. Vassie LollAlva at 2:30 pm   Contact information:   [address] [place] [address] [phone]       Discharge Medications     Medication List    STOP taking these medications        losartan 100 MG tablet  Commonly known as:  COZAAR     warfarin 6 MG tablet  Commonly known as:  COUMADIN      TAKE these medications        carvedilol 12.5 MG tablet  Commonly known as:  COREG  Take 1.5 tablets (18.75 mg total) by mouth 2 (two) times daily.     dextromethorphan-guaiFENesin 30-600 MG per 12 hr tablet  Commonly known as:  MUCINEX DM  Take 1 tablet by mouth 2 (two) times daily.     ezetimibe 10 MG tablet  Commonly known as:  ZETIA  Take 10 mg by mouth daily.     feeding supplement (ENSURE COMPLETE) Liqd  Take 237 mLs by mouth 2 (two) times daily between meals.     furosemide 20 MG tablet  Commonly known as:  LASIX  Take 1 tablet (20 mg total) by mouth daily.     hydrALAZINE 50 MG tablet  Commonly known as:  APRESOLINE  Take 1 tablet (50 mg total) by mouth 3 (three) times daily.      isosorbide mononitrate 30 MG 24 hr tablet  Commonly known as:  IMDUR  Take 1 tablet (30 mg total) by mouth daily.     levothyroxine 50 MCG tablet  Commonly known as:  SYNTHROID, LEVOTHROID  Take 1 tablet (50 mcg total) by mouth daily before breakfast.     rosuvastatin 40 MG tablet  Commonly known as:  CRESTOR  Take 1 tablet (40 mg total) by mouth daily.         Total Time in preparing paper work, data evaluation and todays exam - 35 minutes  Otha Monical M.D on  05/26/2014 at 10:22 AM  [facility] Office  [phone]

## 2014-05-24 NOTE — Progress Notes (Signed)
Ambulate patient in hallway, alert and oriented, no c/o pain or dizziness, or shortness of breath v/s stable. Will continue to monitor patient.

## 2014-05-24 NOTE — Clinical Social Work Note (Addendum)
CSW made aware by MD [facility] and spoke with Jasmine DecemberSharon who states facility cannot accept weekend admission because of patient's complexity. CSW made MD (Elgergawy) aware. CSW to continue to follow.   Nidal Rivet Patrick-Jefferson, LCSWA Weekend Clinical Social Worker [phone]

## 2014-05-24 NOTE — Discharge Instructions (Signed)
Follow with Primary MD MANNING, Adelene AmasJAMES S., MD in 7 days  Follow with your primary cardiology in 10 days regarding decision to resume anticoagulation. Follow with pulmonary service in 2 weeks from discharge as instructed to follow on your pulmonary nodule.  Get CBC, CMP, 2 view Chest X ray checked  by Primary MD next visit.    Activity: As PT/OT recommendation   Disposition SNF   Diet: Heart Healthy , with feeding assistance and aspiration precautions as needed.  For Heart failure patients - Check your Weight same time everyday, if you gain over 2 pounds, or you develop in leg swelling, experience more shortness of breath or chest pain, call your Primary MD immediately. Follow Cardiac Low Salt Diet and 1.8 lit/day fluid restriction.   On your next visit with your primary care physician please Get Medicines reviewed and adjusted.   Please request your Prim.MD to go over all Hospital Tests and Procedure/Radiological results at the follow up, please get all Hospital records sent to your Prim MD by signing hospital release before you go home.   If you experience worsening of your admission symptoms, develop shortness of breath, life threatening emergency, suicidal or homicidal thoughts you must seek medical attention immediately by calling 911 or calling your MD immediately  if symptoms less severe.  You Must read complete instructions/literature along with all the possible adverse reactions/side effects for all the Medicines you take and that have been prescribed to you. Take any new Medicines after you have completely understood and accpet all the possible adverse reactions/side effects.   Do not drive, operating heavy machinery, perform activities at heights, swimming or participation in water activities or provide baby sitting services if your were admitted for syncope or siezures until you have seen by Primary MD or a Neurologist and advised to do so again.  Do not drive when taking Pain  medications.    Do not take more than prescribed Pain, Sleep and Anxiety Medications  Special Instructions: If you have smoked or chewed Tobacco  in the last 2 yrs please stop smoking, stop any regular Alcohol  and or any Recreational drug use.  Wear Seat belts while driving.   Please note  You were cared for by a hospitalist during your hospital stay. If you have any questions about your discharge medications or the care you received while you were in the hospital after you are discharged, you can call the unit and asked to speak with the hospitalist on call if the hospitalist that took care of you is not available. Once you are discharged, your primary care physician will handle any further medical issues. Please note that NO REFILLS for any discharge medications will be authorized once you are discharged, as it is imperative that you return to your primary care physician (or establish a relationship with a primary care physician if you do not have one) for your aftercare needs so that they can reassess your need for medications and monitor your lab values.

## 2014-05-25 LAB — PROCALCITONIN: PROCALCITONIN: 0.41 ng/mL

## 2014-05-25 LAB — PROTIME-INR
INR: 1.24 (ref 0.00–1.49)
Prothrombin Time: 15.7 seconds — ABNORMAL HIGH (ref 11.6–15.2)

## 2014-05-25 MED ORDER — HEPARIN SODIUM (PORCINE) 5000 UNIT/ML IJ SOLN
5000.0000 [IU] | Freq: Three times a day (TID) | INTRAMUSCULAR | Status: DC
  Administered 2014-05-25 – 2014-05-26 (×4): 5000 [IU] via SUBCUTANEOUS
  Filled 2014-05-25 (×3): qty 1

## 2014-05-25 NOTE — Progress Notes (Signed)
[facility] Progress Note   Clinton [name] QMV:[id number] DOB: 01/04/1941 DOA: 05/10/2014 PCP: Arlan OrganMANNING, Clinton S., MD  Brief narrative: Clinton [name] is a 73 y.o. male with a past medical history of coronary artery disease status post CABG, ischemic cardiomyopathy, chronic systolic heart failure with an EF of 20-25% admitted to the hospital with shortness of breath and cough. He was diagnosed with a right lower lobe pneumonia, acute on chronic renal failure and noted to have a mildly elevated troponin of 0.56. It was suspected he had acute on chronic heart failure as well and was admitted to the step down unit for further management. He was treated with IV antibiotics and IV diuretics. Troponins were noted to peak at 3.63. Patient was treated with pneumonia, has been complaining of hemoptysis during his hospital stay which was thought to be secondary to supratherapeutic INR ,He completed 10 days of antibiotics (cefepime from 11/14 to 11/18, then was transitioned to oral levaquin). His hemoptysis has improved some with treating his PNA and holding his coumadin, but had not resolved as of 11/27, therefore a CT chest was obtained in effort to further evaluate. The CT findings showed dense RLL consolidation w/ air bronchograms c/w PNA, sm to mod right effusion and three nodules on the left w/ the largest 1.6 cm in diameter. PCCM were consulted. Will do recommend outpatient follow-up with pulmonary.   Subjective: Had could not sleep, no chest pain, no shortness of breath, no fever, hemoptysis significantly improved, only minimal amount with very minimal blood tinged strikes.   Assessment/Plan: Principal Problem:   CAP / sepsis -Right lower lobe pneumonia with hemoptysis- see below in regards to discontinuing Coumadin -Cefepime initiated on 11/14 and transitioned to levofloxacin on 11/18 - completed a 10 day course - repeat CXR reveals resolution of pneumonia-  - CT chest showing right  lower lobe pneumonia, and left-sided pulmonary nodules  -Pulmonary consult appreciated, recommendation is to repeat chest x-ray on 4-6 weeks off over the resolution of pneumonia.  Pulmonary nodules -Pulmonary consult appreciated, patient will follow with them as an outpatient, and plan is for repeat CT versus PET scan as an outpatient.    Acute on chronic systolic CHF (congestive heart failure), NYHA class 4 -EF 20-25%-has been diuresed sufficiently and diuretics were held for a few days due to acute renal failure and volume depletion -Cardiology has been assisting with management - CXR 11/23 reveals mild pulm edema therefore, I resumed Lasix on 11/23 - remains asymptomatic and is able to ambulate down the hall without dyspnea or hypoxia - continue to monitor fluid status carefully  Leukocytosis - source uncertain- pneumonia does not appear to be recurring but will need to watch symptoms- may be aspirating?- monitor for now. - checked UA was negative for infection - Remains afebrile  Coagulopathy/ hemoptysis - Has been on Coumadin apparently for peripheral vascular disease as started by Dr. Jens [name] many years ago -INR was elevated at 8.53-the patient was given vitamin K-INR steadily improving and today is less than 2 therefore, -  -Coumadin resumed , cardiology, but hemoptysis continued and therefore I discontinued the following day  - Do not plan to resume until hemoptysis has resolved completely.      AKI/ Orthostatic hypotension  -Due to diuresis and improved after holding diuretics- resumed Lasix 11/23 -Continues to improve    Demand ischemia -Max troponin was 3.63 and is now improving    Transaminitis -Suspecting shock liver due to sepsis, hypotension and decreased cardiac output secondary  to heart failure -Statin is on hold - repeat LFTs reveal improved AST and ALT but continues to have an elevated alkaline phosphatase-CT abdomen pelvis on admission does not reveal any  abnormalities in the liver and gallbladder- elevated alkaline phosphatase may be secondary to vitamin D deficiency- vitamin D levels can be ordered as outpatient alkaline phosphatase continues to be elevated   Essential hypertension -Resume hydralazine 05/23/14    CAD, ARTERY BYPASS GRAFT    Severe Peripheral vascular disease -Coumadin to be held for now- see above  Code Status: Full code Family Communication: Discussed with both sons on 05/24/14. Disposition Plan: To skilled nursing facility for rehabilitation DVT prophylaxis: Coumadin held-  Will place SCDs, on subcutaneous heparin  Consultants: Cardiology Pulmonary  Procedures: None   Antibiotics: Anti-infectives    Start     Dose/Rate Route Frequency Ordered Stop   05/16/14 1800  levofloxacin (LEVAQUIN) IVPB 500 mg  Status:  Discontinued     500 mg100 mL/hr over 60 Minutes Intravenous Every 48 hours 05/14/14 1905 05/19/14 1128   05/15/14 2200  vancomycin (VANCOCIN) IVPB 1000 mg/200 mL premix  Status:  Discontinued     1,000 mg200 mL/hr over 60 Minutes Intravenous Every 48 hours 05/14/14 1106 05/14/14 1840   05/14/14 1915  levofloxacin (LEVAQUIN) IVPB 750 mg     750 mg100 mL/hr over 90 Minutes Intravenous  Once 05/14/14 1905 05/14/14 2149   05/12/14 1100  vancomycin (VANCOCIN) IVPB 750 mg/150 ml premix     750 mg150 mL/hr over 60 Minutes Intravenous Every 24 hours 05/11/14 1038 05/14/14 1211   05/11/14 1500  vancomycin (VANCOCIN) IVPB 750 mg/150 ml premix  Status:  Discontinued     750 mg150 mL/hr over 60 Minutes Intravenous Every 24 hours 05/10/14 1430 05/10/14 1655   05/11/14 1045  vancomycin (VANCOCIN) IVPB 1000 mg/200 mL premix     1,000 mg200 mL/hr over 60 Minutes Intravenous NOW 05/11/14 1038 05/11/14 1602   05/10/14 1700  ceFEPIme (MAXIPIME) 1 g in dextrose 5 % 50 mL IVPB  Status:  Discontinued     1 g100 mL/hr over 30 Minutes Intravenous Every 24 hours 05/10/14 1641 05/14/14 1845   05/10/14 1430  vancomycin  (VANCOCIN) IVPB 1000 mg/200 mL premix  Status:  Discontinued     1,000 mg200 mL/hr over 60 Minutes Intravenous NOW 05/10/14 1430 05/10/14 1655   05/10/14 1400  cefTRIAXone (ROCEPHIN) 1 g in dextrose 5 % 50 mL IVPB     1 g100 mL/hr over 30 Minutes Intravenous  Once 05/10/14 1356 05/10/14 1432   05/10/14 1400  azithromycin (ZITHROMAX) 500 mg in dextrose 5 % 250 mL IVPB     500 mg250 mL/hr over 60 Minutes Intravenous  Once 05/10/14 1356 05/10/14 1629         Objective: Filed Weights   05/23/14 0643 05/24/14 0435 05/25/14 0742  Weight: 65.409 kg (144 lb 3.2 oz) 65.091 kg (143 lb 8 oz) 64.411 kg (142 lb)    Intake/Output Summary (Last 24 hours) at 05/25/14 1029 Last data filed at 05/25/14 0700  Gross per 24 hour  Intake   1500 ml  Output    950 ml  Net    550 ml     Vitals Filed Vitals:   05/24/14 1614 05/24/14 2003 05/25/14 0742 05/25/14 0938  BP: 149/94 129/54 108/68 109/68  Pulse: 98 76 89 93  Temp: 97.2 F (36.2 C) 97.9 F (36.6 C) 97.2 F (36.2 C)   TempSrc: Oral Oral Oral   Resp:  18 16   Height:      Weight:   64.411 kg (142 lb)   SpO2: 99% 98% 98%     Exam: General: Awake alert oriented 3, No acute distress Lungs: CTA b/l  Cardiovascular: Regular rate and rhythm without murmur gallop or rub normal S1 and S2 Abdomen: Nontender, nondistended, soft, bowel sounds positive, no rebound, no ascites, no appreciable mass Extremities: No significant cyanosis, clubbing, or edema bilateral lower extremities  Data Reviewed: Basic Metabolic Panel:  Recent Labs Lab 05/19/14 0510 05/20/14 0510 05/21/14 0521 05/23/14 0504 05/24/14 0459  NA 134* 139 137 139 137  K 4.2 4.7 4.4 4.7 4.7  CL 99 102 102 103 102  CO2 21 22 23 23 23   GLUCOSE 95 82 106* 95 80  BUN 78* 67* 59* 45* 40*  CREATININE 2.51* 2.18* 1.92* 1.68* 1.50*  CALCIUM 7.9* 7.9* 8.1* 8.2* 8.3*   Liver Function Tests:  Recent Labs Lab 05/21/14 0521 05/23/14 0504  AST 20 20  ALT 43 32  ALKPHOS 267*  266*  BILITOT 0.9 0.8  PROT 6.2 6.5  ALBUMIN 2.4* 2.6*   No results for input(s): LIPASE, AMYLASE in the last 168 hours. No results for input(s): AMMONIA in the last 168 hours. CBC:  Recent Labs Lab 05/19/14 0510 05/20/14 0510 05/21/14 0521 05/23/14 0504 05/24/14 0459  WBC 15.8* 17.1* 14.5* 13.0* 11.0*  HGB 10.4* 11.6* 11.5* 11.4* 11.5*  HCT 31.4* 35.9* 35.1* 35.7* 35.8*  MCV 84.6 85.7 85.0 84.0 86.1  PLT 190 214 191 170 145*   Cardiac Enzymes: No results for input(s): CKTOTAL, CKMB, CKMBINDEX, TROPONINI in the last 168 hours. BNP (last 3 results)  Recent Labs  06/05/13 0838 07/29/13 0818 05/10/14 0935  PROBNP 17824.0* 3171.0* 63888.0*   CBG: No results for input(s): GLUCAP in the last 168 hours.  No results found for this or any previous visit (from the past 240 hour(s)).   Studies:  Recent x-ray studies have been reviewed in detail by the Attending Physician  Scheduled Meds:  Scheduled Meds: . dextromethorphan-guaiFENesin  1 tablet Oral BID  . docusate sodium  100 mg Oral BID  . feeding supplement (ENSURE COMPLETE)  237 mL Oral BID BM  . furosemide  20 mg Oral Daily  . heparin subcutaneous  5,000 Units Subcutaneous 3 times per day  . hydrALAZINE  50 mg Oral TID  . levothyroxine  50 mcg Oral QAC breakfast  . sodium chloride  10-40 mL Intracatheter Q12H   Continuous Infusions: . sodium chloride 10 mL/hr at 05/16/14 1228    Time spent on care of this patient: 30 min   Valta Dillon, MD [phone] 05/25/2014, 10:29 AM  LOS: 15 days   [facility] Office  [phone] Pager - Text Page per [email or web address]  If 7PM-7AM, please contact night-coverage [email or web address]

## 2014-05-26 NOTE — Progress Notes (Signed)
CSW (Clinical Child psychotherapistocial Worker) [facility] and can [facility] pt today. CSW prepared pt dc packet and provided to pt son who asked to provide pt transportation. CSW notified pt nurse. CSW signing off.  Jizel Cheeks, LCSWA [phone]

## 2014-05-26 NOTE — Progress Notes (Signed)
[facility] Progress Note   Clinton [name] [id number] DOB: 1941-05-15 DOA: 05/10/2014 PCP: Clinton OrganMANNING, JAMES S., [name]   Patient is being discharged today, please review discharge summary dictated on 05/24/14, and updated on 05/26/14,  Patient is stable at time of discharge. Brief narrative: Clinton [name] is a [age] y.o. male with a past medical history of coronary artery disease status post CABG, ischemic cardiomyopathy, chronic systolic heart failure with an EF of 20-25% admitted to the hospital with shortness of breath and cough. He was diagnosed with a right lower lobe pneumonia, acute on chronic renal failure and noted to have a mildly elevated troponin of 0.56. It was suspected he had acute on chronic heart failure as well and was admitted to the step down unit for further management. He was treated with IV antibiotics and IV diuretics. Troponins were noted to peak at 3.63. Patient was treated with pneumonia, has been complaining of hemoptysis during his hospital stay which was thought to be secondary to supratherapeutic INR ,He completed 10 days of antibiotics (cefepime from 11/14 to 11/18, then was transitioned to oral levaquin). His hemoptysis has improved some with treating his PNA and holding his coumadin, but had not resolved as of 11/27, therefore a CT chest was obtained in effort to further evaluate. The CT findings showed dense RLL consolidation [name]/ air bronchograms c/[name] PNA, sm to mod right effusion and three nodules on the left [name]/ the largest 1.6 cm in diameter. PCCM were consulted. Will do recommend outpatient follow-up with pulmonary.   Subjective: Had could not sleep, no chest pain, no shortness of breath, no fever, hemoptysis significantly improved, only minimal amount with very minimal blood tinged strikes.   Assessment/Plan: Principal Problem:   CAP / sepsis -Right lower lobe pneumonia with hemoptysis- see below in regards to discontinuing Coumadin -Cefepime initiated  on 11/14 and transitioned to levofloxacin on 11/18 - completed a 10 day course - repeat CXR reveals resolution of pneumonia-  - CT chest showing right lower lobe pneumonia, and left-sided pulmonary nodules  -Pulmonary consult appreciated, recommendation is to repeat chest x-ray on 4-6 weeks off over the resolution of pneumonia.  Pulmonary nodules -Pulmonary consult appreciated, patient will follow with them as an outpatient, and plan is for repeat CT versus PET scan as an outpatient.    Acute on chronic systolic CHF (congestive heart failure), NYHA class 4 -EF 20-25%-has been diuresed sufficiently and diuretics were held for a few days due to acute renal failure and volume depletion -Cardiology has been assisting with management - CXR 11/23 reveals mild pulm edema therefore, I resumed Lasix on 11/23 - remains asymptomatic and is able to ambulate down the hall without dyspnea or hypoxia - continue to monitor fluid status carefully  Leukocytosis - source uncertain- pneumonia does not appear to be recurring but will need to watch symptoms- may be aspirating?- monitor for now. - checked UA was negative for infection - Remains afebrile  Coagulopathy/ hemoptysis - Has been on Coumadin apparently for peripheral vascular disease as started by Clinton [name] many years ago -INR was elevated at 8.53-the patient was given vitamin K-INR steadily improving and today is less than 2 therefore, -  -Coumadin resumed , cardiology, but hemoptysis continued and therefore I discontinued the following day  - Do not plan to resume until hemoptysis has resolved completely.      AKI/ Orthostatic hypotension  -Due to diuresis and improved after holding diuretics- resumed Lasix 11/23 -Continues to improve    Demand  ischemia -Max troponin was 3.63 and is now improving    Transaminitis -Suspecting shock liver due to sepsis, hypotension and decreased cardiac output secondary to heart failure -Statin is on  hold - repeat LFTs reveal improved AST and ALT but continues to have an elevated alkaline phosphatase-CT abdomen pelvis on admission does not reveal any abnormalities in the liver and gallbladder- elevated alkaline phosphatase may be secondary to vitamin D deficiency- vitamin D levels can be ordered as outpatient alkaline phosphatase continues to be elevated   Essential hypertension -Resume hydralazine 05/23/14    CAD, ARTERY BYPASS GRAFT    Severe Peripheral vascular disease -Coumadin to be held for now- see above  Code Status: Full code Family Communication: Discussed with both sons on 05/24/14. Disposition Plan: To skilled nursing facility for rehabilitation today. DVT prophylaxis: Coumadin held-  Will place SCDs, on subcutaneous heparin  Consultants: Cardiology Pulmonary  Procedures: None   Antibiotics: Anti-infectives    Start     Dose/Rate Route Frequency Ordered Stop   05/16/14 1800  levofloxacin (LEVAQUIN) IVPB 500 mg  Status:  Discontinued     500 mg100 mL/hr over 60 Minutes Intravenous Every 48 hours 05/14/14 1905 05/19/14 1128   05/15/14 2200  vancomycin (VANCOCIN) IVPB 1000 mg/200 mL premix  Status:  Discontinued     1,000 mg200 mL/hr over 60 Minutes Intravenous Every 48 hours 05/14/14 1106 05/14/14 1840   05/14/14 1915  levofloxacin (LEVAQUIN) IVPB 750 mg     750 mg100 mL/hr over 90 Minutes Intravenous  Once 05/14/14 1905 05/14/14 2149   05/12/14 1100  vancomycin (VANCOCIN) IVPB 750 mg/150 ml premix     750 mg150 mL/hr over 60 Minutes Intravenous Every 24 hours 05/11/14 1038 05/14/14 1211   05/11/14 1500  vancomycin (VANCOCIN) IVPB 750 mg/150 ml premix  Status:  Discontinued     750 mg150 mL/hr over 60 Minutes Intravenous Every 24 hours 05/10/14 1430 05/10/14 1655   05/11/14 1045  vancomycin (VANCOCIN) IVPB 1000 mg/200 mL premix     1,000 mg200 mL/hr over 60 Minutes Intravenous NOW 05/11/14 1038 05/11/14 1602   05/10/14 1700  ceFEPIme (MAXIPIME) 1 g in dextrose 5 %  50 mL IVPB  Status:  Discontinued     1 g100 mL/hr over 30 Minutes Intravenous Every 24 hours 05/10/14 1641 05/14/14 1845   05/10/14 1430  vancomycin (VANCOCIN) IVPB 1000 mg/200 mL premix  Status:  Discontinued     1,000 mg200 mL/hr over 60 Minutes Intravenous NOW 05/10/14 1430 05/10/14 1655   05/10/14 1400  cefTRIAXone (ROCEPHIN) 1 g in dextrose 5 % 50 mL IVPB     1 g100 mL/hr over 30 Minutes Intravenous  Once 05/10/14 1356 05/10/14 1432   05/10/14 1400  azithromycin (ZITHROMAX) 500 mg in dextrose 5 % 250 mL IVPB     500 mg250 mL/hr over 60 Minutes Intravenous  Once 05/10/14 1356 05/10/14 1629         Objective: Filed Weights   05/24/14 0435 05/25/14 0742 05/26/14 0505  Weight: 65.091 kg (143 lb 8 oz) 64.411 kg (142 lb) 63.685 kg (140 lb 6.4 oz)    Intake/Output Summary (Last 24 hours) at 05/26/14 1023 Last data filed at 05/26/14 0905  Gross per 24 hour  Intake   1260 ml  Output   1300 ml  Net    -40 ml     Vitals Filed Vitals:   05/25/14 1504 05/25/14 2007 05/26/14 0505 05/26/14 1001  BP: 145/77 100/70 130/95 136/55  Pulse: 89 86  96 91  Temp: 97.5 F (36.4 C) 97.5 F (36.4 C) 97.2 F (36.2 C)   TempSrc: Oral Oral Oral   Resp:  19 18 18   Height:      Weight:   63.685 kg (140 lb 6.4 oz)   SpO2: 98% 95% 98% 98%    Exam: General: Awake alert oriented 3, No acute distress Lungs: CTA b/l  Cardiovascular: Regular rate and rhythm without murmur gallop or rub normal S1 and S2 Abdomen: Nontender, nondistended, soft, bowel sounds positive, no rebound, no ascites, no appreciable mass Extremities: No significant cyanosis, clubbing, or edema bilateral lower extremities  Data Reviewed: Basic Metabolic Panel:  Recent Labs Lab 05/20/14 0510 05/21/14 0521 05/23/14 0504 05/24/14 0459  NA 139 137 139 137  K 4.7 4.4 4.7 4.7  CL 102 102 103 102  CO2 22 23 23 23   GLUCOSE 82 106* 95 80  BUN 67* 59* 45* 40*  CREATININE 2.18* 1.92* 1.68* 1.50*  CALCIUM 7.9* 8.1* 8.2* 8.3*    Liver Function Tests:  Recent Labs Lab 05/21/14 0521 05/23/14 0504  AST 20 20  ALT 43 32  ALKPHOS 267* 266*  BILITOT 0.9 0.8  PROT 6.2 6.5  ALBUMIN 2.4* 2.6*   No results for input(s): LIPASE, AMYLASE in the last 168 hours. No results for input(s): AMMONIA in the last 168 hours. CBC:  Recent Labs Lab 05/20/14 0510 05/21/14 0521 05/23/14 0504 05/24/14 0459  WBC 17.1* 14.5* 13.0* 11.0*  HGB 11.6* 11.5* 11.4* 11.5*  HCT 35.9* 35.1* 35.7* 35.8*  MCV 85.7 85.0 84.0 86.1  PLT 214 191 170 145*   Cardiac Enzymes: No results for input(s): CKTOTAL, CKMB, CKMBINDEX, TROPONINI in the last 168 hours. BNP (last 3 results)  Recent Labs  06/05/13 0838 07/29/13 0818 05/10/14 0935  PROBNP 17824.0* 3171.0* 63888.0*   CBG: No results for input(s): GLUCAP in the last 168 hours.  No results found for this or any previous visit (from the past 240 hour(s)).   Studies:  Recent x-ray studies have been reviewed in detail by the Attending Physician  Scheduled Meds:  Scheduled Meds: . dextromethorphan-guaiFENesin  1 tablet Oral BID  . docusate sodium  100 mg Oral BID  . feeding supplement (ENSURE COMPLETE)  237 mL Oral BID BM  . furosemide  20 mg Oral Daily  . heparin subcutaneous  5,000 Units Subcutaneous 3 times per day  . hydrALAZINE  50 mg Oral TID  . levothyroxine  50 mcg Oral QAC breakfast  . sodium chloride  10-40 mL Intracatheter Q12H   Continuous Infusions: . sodium chloride 10 mL/hr at 05/16/14 1228    Time spent on care of this patient: 25 min   Kirt Chew, [name] [phone] 05/26/2014, 10:23 AM  LOS: 16 days   [facility] Office  [phone] Pager - Text Page per [email or web address]  If 7PM-7AM, please contact night-coverage [email or web address]

## 2014-05-26 NOTE — Progress Notes (Signed)
[facility]  All questions answered.

## 2014-05-26 NOTE — Progress Notes (Signed)
Physical Therapy Treatment Patient Details Name: Clinton HeadsCharles W Richert MRN: [id number] DOB: 1941-01-29 Today's Date: 05/26/2014    History of Present Illness Patient with complex medical history presents with sepsis and work up revealing Community-acquired pneumonia.     PT Comments    Pt making steady progress.  Follow Up Recommendations  SNF     Equipment Recommendations  Rolling walker with 5" wheels    Recommendations for Other Services       Precautions / Restrictions Precautions Precautions: Fall    Mobility  Bed Mobility                  Transfers Overall transfer level: Needs assistance Equipment used: Rolling walker (2 wheeled) Transfers: Sit to/from Stand Sit to Stand: Supervision         General transfer comment: Verbal cues for hand placement  Ambulation/Gait Ambulation/Gait assistance: Supervision Ambulation Distance (Feet): 150 Feet Assistive device: Rolling walker (2 wheeled) Gait Pattern/deviations: Step-through pattern;Decreased stride length   Gait velocity interpretation: Below normal speed for age/gender General Gait Details: verbal cues to stand more erect   Stairs            Wheelchair Mobility    Modified Rankin (Stroke Patients Only)       Balance   Sitting-balance support: No upper extremity supported;Feet supported Sitting balance-Leahy Scale: Good     Standing balance support: No upper extremity supported Standing balance-Leahy Scale: Fair                      Cognition Arousal/Alertness: Awake/alert Behavior During Therapy: Flat affect Overall Cognitive Status: Within Functional Limits for tasks assessed                      Exercises      General Comments        Pertinent Vitals/Pain Pain Assessment: No/denies pain    Home Living                      Prior Function            PT Goals (current goals can now be found in the care plan section) Progress towards PT  goals: Progressing toward goals    Frequency  Min 2X/week    PT Plan Current plan remains appropriate;Frequency needs to be updated    Co-evaluation             End of Session   Activity Tolerance: Patient tolerated treatment well Patient left: in chair;with call bell/phone within reach     Time: 1208-1221 PT Time Calculation (min) (ACUTE ONLY): 13 min  Charges:  $Gait Training: 8-22 mins                    G Codes:      Orilla Templeman 05/26/2014, 12:48 PM  [facility]av[facility] PT [phone]

## 2014-05-26 NOTE — Clinical Social Work Placement (Signed)
     Clinical Social Work Department CLINICAL SOCIAL WORK PLACEMENT NOTE 05/26/2014  Patient:  Clinton [name],[name]  Account Number:  [id number] Admit date:  05/10/2014  Clinical Social Worker:  Merlyn LotJENNA HOLOMAN, CLINICAL SOCIAL WORKER  Date/time:  05/14/2014 02:40 PM  Clinical Social Work is seeking post-discharge placement for this patient at the following level of care:   SKILLED NURSING   (*CSW will update this form in Epic as items are completed)   05/14/2014  Patient/family provided with [facility] Department of Clinical Social Works list of facilities offering this level of care within the geographic area requested by the patient (or if unable, by the patients family).  05/14/2014  Patient/family informed of their freedom to choose among providers that offer the needed level of care, that participate in Medicare, Medicaid or managed care program needed by the patient, have an available bed and are willing to accept the patient.  05/14/2014  Patient/family informed of [facility] ownership interest in [facility], as well as of the fact that they are under no obligation to receive care at this facility.  PASARR submitted to EDS on 05/14/2014 PASARR number received on 05/14/2014  FL2 transmitted to all facilities in geographic area requested by pt/family on  05/14/2014 FL2 transmitted to all facilities within larger geographic area on   Patient informed that his/her managed care company has contracts with or will negotiate with  certain facilities, including the following:   [employer] - not [employer]- auth required     Patient/family informed of bed offers received:  05/23/2014 [facility] Physician recommends and patient chooses bed at    [facility] on  05/26/2014 Patient to be transferred to facility by car with son Patient and family notified of transfer on 05/26/2014 Name of family member notified:  Doreene Elandicky  Struve  The following physician request were entered in Epic: Physician Request  Please sign FL2.  Please prepare priority discharge summary and prescriptions.    Additional Comments: 05/26/14 DC to SNF today facilitated by Poonum Ambelal, LCSWA (coverage) [facility]. Son transported patient. CSW services signed off.

## 2014-05-26 NOTE — Progress Notes (Addendum)
CSW (Clinical Child psychotherapistocial Worker) left voicemail for facility notifying of dc today.  ADDENDUM 10:30am: CSW spoke with facility and they informed CSW that pt insurance authorization not valid for today. CSW sent updated clinicals. Facility to submit for new authorization.   ADDENDUM 11:25am: CSW spoke with pt and pt son at bedside and explained current situation with facility and insurance authorization. Pt son understanding. CSW notified pt and pt son that pt will need to dc today and may need to consider LOG facility vs home with home health services. CSW to wait until after lunch and update family.  Jamielyn Petrucci, LCSWA [phone]

## 2014-05-27 ENCOUNTER — Non-Acute Institutional Stay (SKILLED_NURSING_FACILITY): Payer: Medicare HMO | Admitting: Adult Health

## 2014-05-27 DIAGNOSIS — J189 Pneumonia, unspecified organism: Secondary | ICD-10-CM

## 2014-05-27 DIAGNOSIS — I10 Essential (primary) hypertension: Secondary | ICD-10-CM

## 2014-05-27 DIAGNOSIS — R531 Weakness: Secondary | ICD-10-CM

## 2014-05-27 DIAGNOSIS — E039 Hypothyroidism, unspecified: Secondary | ICD-10-CM

## 2014-05-27 DIAGNOSIS — I5023 Acute on chronic systolic (congestive) heart failure: Secondary | ICD-10-CM

## 2014-05-27 DIAGNOSIS — R911 Solitary pulmonary nodule: Secondary | ICD-10-CM

## 2014-05-27 DIAGNOSIS — E785 Hyperlipidemia, unspecified: Secondary | ICD-10-CM

## 2014-05-27 HISTORY — DX: Pneumonia, unspecified organism: J18.9

## 2014-05-30 ENCOUNTER — Non-Acute Institutional Stay (SKILLED_NURSING_FACILITY): Payer: Medicare HMO | Admitting: Internal Medicine

## 2014-05-30 DIAGNOSIS — R531 Weakness: Secondary | ICD-10-CM

## 2014-05-30 DIAGNOSIS — I10 Essential (primary) hypertension: Secondary | ICD-10-CM

## 2014-05-30 DIAGNOSIS — I5023 Acute on chronic systolic (congestive) heart failure: Secondary | ICD-10-CM

## 2014-05-30 DIAGNOSIS — R609 Edema, unspecified: Secondary | ICD-10-CM

## 2014-05-30 DIAGNOSIS — E46 Unspecified protein-calorie malnutrition: Secondary | ICD-10-CM

## 2014-05-30 DIAGNOSIS — E039 Hypothyroidism, unspecified: Secondary | ICD-10-CM

## 2014-05-30 DIAGNOSIS — E785 Hyperlipidemia, unspecified: Secondary | ICD-10-CM

## 2014-05-30 DIAGNOSIS — J181 Lobar pneumonia, unspecified organism: Secondary | ICD-10-CM

## 2014-05-30 DIAGNOSIS — J189 Pneumonia, unspecified organism: Secondary | ICD-10-CM

## 2014-05-30 DIAGNOSIS — R911 Solitary pulmonary nodule: Secondary | ICD-10-CM

## 2014-05-31 NOTE — Progress Notes (Signed)
Patient ID: Clinton [name], male   DOB: [birth year], 73 y.o.   MRN: [id number]     [facility]   PCP: MANNING, Adelene Amas., MD  Code Status: full code  Allergies  Allergen Reactions  . Codeine Phosphate Nausea And Vomiting    Chief Complaint  Patient presents with  . New Admit To SNF     HPI:  73 y/o male patient is here for STR post hospital admission from 05/10/14-05/26/14 with dyspnea and was diagnosed with right lower lobe pneumonia, acute on chronic renal failure and acute chf exacerbation. He responded well to iv antibiotics and iv diuretics. He had hemoptysis, coumadin was held. CT chest showed dense RLL consolidation w/ air bronchograms c/w PNA, right effusion and three nodules on the left w/ the largest 1.6 cm in diameter. PCCM were consulted, and recommendation was made for outpatient follow-up in 2 weeks for further evaluation for pulmonary nodule and possible repeat CT scan versus PET scan as an outpatient. His apresoline dosing reduced in facility with his bp being on lower side. His bp has been stable in the facility. He is seen in his room. He complaints of soreness in his bottom. His cough is better. He has streaks of blood in his phlegm. Denies chest pain. Breathing has improved at rest, gets short winded with exertion. He feels that his energy level is returning back to his baseline. He has been working with therapy team. He has past medical history of coronary artery disease status post CABG, ischemic cardiomyopathy, chronic systolic heart failure with an EF of 20-25%   Review of Systems:  Constitutional: Negative for fever, chills, diaphoresis.  HENT: Negative for congestion Respiratory: Negative for wheezing.   Cardiovascular: Negative for chest pain, palpitations,. Has leg swelling post activities on his feet.  Gastrointestinal: Negative for heartburn, nausea, vomiting, abdominal pain, constipation. appetite is fair Genitourinary:  Negative for dysuria Musculoskeletal: Negative for back pain, falls Skin: Negative for itching Neurological: Negative for dizziness, tingling, focal weakness and headaches.  Psychiatric/Behavioral: Negative for depression  Past Medical History  Diagnosis Date  . Peripheral arterial disease     a. Ao-bifem bypass w/ 16x8 hemashield dacron graft, R aortorenal bypass w/ 6mm dacron graft;  b. 09/2011 ABI: R 0/87, L 0.92.  . Carotid artery occlusion     a. 12/2002 R CEA;  b. 05/2007 L Carotid stenting;  c. 09/2008 repeat L carotid stenting 2/2 ISR;  d. 09/2011 Carotid U/S: RICA 40-59%, LICA 60-79%, >50 dist LCCA stenosis.  Marland Kitchen CAD (coronary artery disease)     a. CABG 1985;  b. 05/2002 Rotablator/PTCA to ostial LCX and RI;  c. 12/2009 MV: inf and lat infarct w/ minimal mid lateral and basilar ant ischemia, EF 29%->Med Rx.  . Cardiomyopathy, ischemic     a. 06/2010 Cardiac MRI: EF 30%, anterolat, post, inf prior subendocardial infarcts;  b. 08/2012 Echo: EF 30-35%, mild LVH mult wma's, Gr 2 DD;  c. 05/2013 Echo: EF 20-25%, diff HK, mod AS, mild MR, mod dil LA, mild to mod TR, PASP .  . Stroke     remote  . Hypertension   . Hyperlipidemia   . Tobacco abuse   . Anticoagulated on Coumadin   . CKD (chronic kidney disease), stage III   . Chronic systolic CHF (congestive heart failure)     a. 05/2013 EF 20-25%, diff HK.  . Moderate aortic stenosis     a. 05/2013 Echo: at least moderate AS (Valve  area 1.07 cm2 - VTI; 1.08cm2 - Vmax).  . Hypothyroidism     a. 05/2013 TSH 50.15 - synthroid 50 mcg daily started.   Past Surgical History  Procedure Laterality Date  . Carotid endarterectomy    . Coronary artery bypass graft    . Pr vein bypass graft,aorto-fem-pop    . Ureteral artery renal bypass     Social History:   reports that he quit smoking about a year ago. His smoking use included Cigarettes. He has a 5 pack-year smoking history. He has never used smokeless tobacco. He reports that he does  not drink alcohol or use illicit drugs.  Family History  Problem Relation Age of Onset  . CAD      Medications: Patient's Medications  New Prescriptions   No medications on file  Previous Medications   CARVEDILOL (COREG) 12.5 MG TABLET    Take 1.5 tablets (18.75 mg total) by mouth 2 (two) times daily.   DEXTROMETHORPHAN-GUAIFENESIN (MUCINEX DM) 30-600 MG PER 12 HR TABLET    Take 1 tablet by mouth 2 (two) times daily.   EZETIMIBE (ZETIA) 10 MG TABLET    Take 10 mg by mouth daily.     FEEDING SUPPLEMENT, ENSURE COMPLETE, (ENSURE COMPLETE) LIQD    Take 237 mLs by mouth 2 (two) times daily between meals.   FUROSEMIDE (LASIX) 20 MG TABLET    Take 1 tablet (20 mg total) by mouth daily.   HYDRALAZINE (APRESOLINE) 50 MG TABLET    Take 1 tablet (50 mg total) by mouth 3 (three) times daily.   ISOSORBIDE MONONITRATE (IMDUR) 30 MG 24 HR TABLET    Take 1 tablet (30 mg total) by mouth daily.   LEVOTHYROXINE (SYNTHROID, LEVOTHROID) 50 MCG TABLET    Take 1 tablet (50 mcg total) by mouth daily before breakfast.   ROSUVASTATIN (CRESTOR) 40 MG TABLET    Take 1 tablet (40 mg total) by mouth daily.  Modified Medications   No medications on file  Discontinued Medications   No medications on file     Physical Exam: Filed Vitals:   05/30/14 1732  BP: 122/60  Pulse: 71  Temp: 97.8 F (36.6 C)  Resp: 18  SpO2: 96%    General- elderly male in no acute distress Head- atraumatic, normocephalic Eyes- PERRLA, EOMI, no pallor, no icterus, no discharge Neck- no cervical lymphadenopathy Throat- moist mucus membrane Cardiovascular- normal s1,s2, no murmurs, normal dorsalis pedis Respiratory- decreased air entry at bases, no wheeze, no rhonchi, no crackles, no use of accessory muscles Abdomen- bowel sounds present, soft, non tender Musculoskeletal- able to move all 4 extremities, 1+ leg edema, using walker Neurological- no focal deficit Skin- warm and dry, redness in his sacral area with scaling skin,  no skin breakdown Psychiatry- alert and oriented to person, place and time, normal mood and affect   Labs reviewed: Basic Metabolic Panel:  Recent Labs  40/98/1111/17/15 0740 05/14/14 0356 05/15/14 0500  05/21/14 0521 05/23/14 0504 05/24/14 0459  NA 126* 126* 133*  < > 137 139 137  K 3.3* 4.0 3.9  < > 4.4 4.7 4.7  CL 86* 88* 95*  < > 102 103 102  CO2 20 17* 18*  < > 23 23 23   GLUCOSE 102* 110* 145*  < > 106* 95 80  BUN 93* 95* 97*  < > 59* 45* 40*  CREATININE 3.48* 3.83* 3.52*  < > 1.92* 1.68* 1.50*  CALCIUM 8.0* 8.3* 7.9*  < > 8.1* 8.2* 8.3*  MG 2.3 2.5 2.3  --   --   --   --   < > = values in this interval not displayed. Liver Function Tests:  Recent Labs  05/17/14 0425 05/21/14 0521 05/23/14 0504  AST 67* 20 20  ALT 116* 43 32  ALKPHOS 261* 267* 266*  BILITOT 0.9 0.9 0.8  PROT 6.1 6.2 6.5  ALBUMIN 2.3* 2.4* 2.6*    Recent Labs  06/05/13 0830  LIPASE 92*   No results for input(s): AMMONIA in the last 8760 hours. CBC:  Recent Labs  10/31/13 0819  05/13/14 0740 05/13/14 1920  05/21/14 0521 05/23/14 0504 05/24/14 0459  WBC 8.1  < > 13.4* 11.6*  < > 14.5* 13.0* 11.0*  NEUTROABS 6.5  --  12.5* 10.8*  --   --   --   --   HGB 12.8*  < > 10.0* 9.6*  < > 11.5* 11.4* 11.5*  HCT 38.6*  < > 29.1* 27.6*  < > 35.1* 35.7* 35.8*  MCV 84.9  < > 80.6 79.8  < > 85.0 84.0 86.1  PLT 182.0  < > 156 159  < > 191 170 145*  < > = values in this interval not displayed. Cardiac Enzymes:  Recent Labs  05/10/14 1910 05/10/14 2310 05/11/14 0454  TROPONINI 2.15* 3.63* 2.96*    Assessment/Plan  Generalized weakness Will have him work with physical therapy and occupational therapy team to help with gait training and muscle strengthening exercises.fall precautions. Skin care. Encourage to be out of bed.   Pneumonia Completed antibiotics. Continue mucinex dm. Pending repeat cxr  Leg edema Will have him wear ted hose and keep legs elevated at rest. Continue lasix 20 mg  daily  CHF Monitor daily weight, continue coreg, imdur, hydralazine and lasix. Monitor bmp. If has improved renal function, will resume losartan  HTN bp stable. Tolerating decreased dosing of apresoline well. Continue 25 mg tid with coreg and imdur 30 mg daily  Pulmonary nodule pending outpatient f/u with pulmonary with repeat ct chest  Protein calorie malnutrition Continue feeding supplement, encouraged po intake, continue skin care and pressure ulcer prophylaxis  Hypothyroidism Continue levothyroxine 50 mg daily  HLD Continue crestor and zetia   Family/ staff Communication: reviewed care plan with patient and nursing supervisor   Goals of care: short term rehabilitation   Labs/tests ordered: cbc, bmp, inr, f/u with pulmonary as outpatient    Oneal GroutMAHIMA Pio Eatherly, MD  [facility] [phone] (Monday-Friday 8 am - 5 pm) [phone] (afterhours)

## 2014-06-10 ENCOUNTER — Ambulatory Visit (INDEPENDENT_AMBULATORY_CARE_PROVIDER_SITE_OTHER)
Admission: RE | Admit: 2014-06-10 | Discharge: 2014-06-10 | Disposition: A | Discharge status: Home / Self Care | Payer: Medicare HMO | Source: Ambulatory Visit | Attending: Adult Health | Admitting: Adult Health

## 2014-06-10 ENCOUNTER — Ambulatory Visit (INDEPENDENT_AMBULATORY_CARE_PROVIDER_SITE_OTHER): Payer: Medicare HMO | Admitting: Adult Health

## 2014-06-10 ENCOUNTER — Inpatient Hospital Stay: Payer: Medicare HMO | Admitting: Pulmonary Disease

## 2014-06-10 ENCOUNTER — Encounter: Payer: Self-pay | Admitting: Adult Health

## 2014-06-10 VITALS — BP 104/74 | HR 65 | Temp 97.7°F | Ht 71.0 in | Wt 149.0 lb

## 2014-06-10 DIAGNOSIS — J189 Pneumonia, unspecified organism: Secondary | ICD-10-CM

## 2014-06-10 DIAGNOSIS — R911 Solitary pulmonary nodule: Secondary | ICD-10-CM

## 2014-06-10 DIAGNOSIS — J90 Pleural effusion, not elsewhere classified: Secondary | ICD-10-CM

## 2014-06-10 NOTE — Patient Instructions (Addendum)
Continue on current regimen  We are setting you up for a CT chest in 2 weeks to follow lung nodules  Follow up with Dr. Vassie LollAlva  In 4 weeks with PFT

## 2014-06-11 ENCOUNTER — Ambulatory Visit (INDEPENDENT_AMBULATORY_CARE_PROVIDER_SITE_OTHER): Payer: Medicare HMO | Admitting: Physician Assistant

## 2014-06-11 ENCOUNTER — Encounter: Payer: Self-pay | Admitting: Physician Assistant

## 2014-06-11 VITALS — BP 118/80 | HR 64 | Ht 71.0 in | Wt 152.3 lb

## 2014-06-11 DIAGNOSIS — I350 Nonrheumatic aortic (valve) stenosis: Secondary | ICD-10-CM

## 2014-06-11 DIAGNOSIS — E785 Hyperlipidemia, unspecified: Secondary | ICD-10-CM

## 2014-06-11 DIAGNOSIS — Z79899 Other long term (current) drug therapy: Secondary | ICD-10-CM

## 2014-06-11 DIAGNOSIS — I10 Essential (primary) hypertension: Secondary | ICD-10-CM

## 2014-06-11 DIAGNOSIS — I5023 Acute on chronic systolic (congestive) heart failure: Secondary | ICD-10-CM

## 2014-06-11 DIAGNOSIS — N183 Chronic kidney disease, stage 3 unspecified: Secondary | ICD-10-CM

## 2014-06-11 DIAGNOSIS — Z7901 Long term (current) use of anticoagulants: Secondary | ICD-10-CM

## 2014-06-11 DIAGNOSIS — R911 Solitary pulmonary nodule: Secondary | ICD-10-CM | POA: Insufficient documentation

## 2014-06-11 MED ORDER — FUROSEMIDE 40 MG PO TABS: 40.0000 mg | ORAL_TABLET | Freq: Every day | ORAL | Status: DC

## 2014-06-11 MED ORDER — POTASSIUM CHLORIDE ER 10 MEQ PO TBCR: 10.0000 meq | EXTENDED_RELEASE_TABLET | Freq: Every day | ORAL | Status: DC

## 2014-06-11 NOTE — Assessment & Plan Note (Signed)
Lipid Panel     Component Value Date/Time   CHOL 243* 10/31/2013 0819   TRIG 102.0 10/31/2013 0819   HDL 40.90 10/31/2013 0819   CHOLHDL 6 10/31/2013 0819   VLDL 20.4 10/31/2013 0819   LDLCALC 182* 10/31/2013 0819   LDLDIRECT 186.1 09/21/2012 0754    Continue Crestor and Zetia

## 2014-06-11 NOTE — Assessment & Plan Note (Signed)
Pressure well controlled.

## 2014-06-11 NOTE — Assessment & Plan Note (Signed)
INR 3.9 today according to the patient. This is being managed at rehabilitation. Will check INR on Monday

## 2014-06-11 NOTE — Assessment & Plan Note (Signed)
No complaints of chest pain

## 2014-06-11 NOTE — Progress Notes (Signed)
Patient ID: Clinton [name], male   DOB: [birth year], 73 y.o.   MRN: [id number]    Date:  06/11/2014   ID:  Clinton [name], DOB February 11, 1941, MRN [id number]  PCP:  Vickii Penna., MD  Primary Cardiologist:  Stanford Breed    History of Present Illness: Clinton [name] is a 73 y.o. male with a past medical history significant for coronary artery disease status post coronary bypassing graft in 1985, subsequent rotablation and PCI , peripheral vascular disease, cerebrovascular disease, hypertension, hyperlipidemia and CHF with EF of 30% by cMRI. Peripheral vascular disease is followed by vascular surgery - he is s/p aortobifem bypass, also significant carotid artery disease. His last myovue was performed in July of 2011. This revealed previous inferior and lateral wall infarct with minimal ischemia in the mid lateral wall and base of the anterior wall. The LV is dilated with EF 29%. Echocardiogram in December 2014 showed an ejection fraction of 20-25%, moderate aortic stenosis, biatrial enlargement, mild to moderate tricuspid regurgitation and mild mitral regurgitation. Seen by Dr Rayann Heman in Feb 2012 for ICD but patient declined. Admitted in December of 2014 with congestive heart failure. Improved with diuresis. TSH noted to be 50 and patient started on Synthroid  The patient was admitted from 05/10/2014 and 05/26/2014 with community acquired pneumonia, acute on chronic renal failure and acute on chronic systolic heart failure. His weight at discharge was 63.7 kg and today it is 69 kg.  He complains of lower extremity edema but denies shortness of breath beyond baseline or orthopnea or PND.  He is at rehabilitation now and feels like they're really helping him quite a bit. He is due to go home tomorrow.    The patient currently denies nausea, vomiting, fever, chest pain, dizziness, cough, congestion, abdominal pain, hematochezia, melena, lower extremity edema, claudication.  Wt Readings from Last 3  Encounters:  06/11/14 152 lb 4.8 oz (69.083 kg)  06/10/14 149 lb (67.586 kg)  05/26/14 140 lb 6.4 oz (63.685 kg)     Past Medical History  Diagnosis Date  . Peripheral arterial disease     a. Ao-bifem bypass w/ 16x8 hemashield dacron graft, R aortorenal bypass w/ 79m dacron graft;  b. 09/2011 ABI: R 0/87, L 0.92.  . Carotid artery occlusion     a. 12/2002 R CEA;  b. 05/2007 L Carotid stenting;  c. 09/2008 repeat L carotid stenting 2/2 ISR;  d. 09/2011 Carotid U/S: RICA 458-09% LICA 698-33% >>82dist LCCA stenosis.  .Marland KitchenCAD (coronary artery disease)     a. CABG 1985;  b. 05/2002 Rotablator/PTCA to ostial LCX and RI;  c. 12/2009 MV: inf and lat infarct w/ minimal mid lateral and basilar ant ischemia, EF 29%->Med Rx.  . Cardiomyopathy, ischemic     a. 06/2010 Cardiac MRI: EF 30%, anterolat, post, inf prior subendocardial infarcts;  b. 08/2012 Echo: EF 30-35%, mild LVH mult wma's, Gr 2 DD;  c. 05/2013 Echo: EF 20-25%, diff HK, mod AS, mild MR, mod dil LA, mild to mod TR, PASP 612mg.  . Stroke     remote  . Hypertension   . Hyperlipidemia   . Tobacco abuse   . Anticoagulated on Coumadin   . CKD (chronic kidney disease), stage III   . Chronic systolic CHF (congestive heart failure)     a. 05/2013 EF 20-25%, diff HK.  . Moderate aortic stenosis     a. 05/2013 Echo: at least moderate AS (Valve area 1.07 cm2 - VTI; 1.08cm2 -  Vmax).  . Hypothyroidism     a. 05/2013 TSH 50.15 - synthroid 50 mcg daily started.    Current Outpatient Prescriptions  Medication Sig Dispense Refill  . carvedilol (COREG) 12.5 MG tablet Take 1.5 tablets (18.75 mg total) by mouth 2 (two) times daily. 90 tablet 12  . dextromethorphan-guaiFENesin (MUCINEX DM) 30-600 MG per 12 hr tablet Take 1 tablet by mouth 2 (two) times daily.    Marland Kitchen ezetimibe (ZETIA) 10 MG tablet Take 10 mg by mouth daily.      . feeding supplement, ENSURE COMPLETE, (ENSURE COMPLETE) LIQD Take 237 mLs by mouth 2 (two) times daily between meals.    .  hydrALAZINE (APRESOLINE) 50 MG tablet Take 1 tablet (50 mg total) by mouth 3 (three) times daily. 90 tablet 2  . isosorbide mononitrate (IMDUR) 30 MG 24 hr tablet Take 1 tablet (30 mg total) by mouth daily. 30 tablet 6  . levothyroxine (SYNTHROID, LEVOTHROID) 50 MCG tablet Take 1 tablet (50 mcg total) by mouth daily before breakfast. 30 tablet 3  . rosuvastatin (CRESTOR) 40 MG tablet Take 1 tablet (40 mg total) by mouth daily. 30 tablet 6  . warfarin (COUMADIN) 4 MG tablet Take 4 mg by mouth daily.    . furosemide (LASIX) 40 MG tablet Take 1 tablet (40 mg total) by mouth daily. 30 tablet 6  . potassium chloride (K-DUR) 10 MEQ tablet Take 1 tablet (10 mEq total) by mouth daily. 30 tablet 6   No current facility-administered medications for this visit.    Allergies:    Allergies  Allergen Reactions  . Codeine Phosphate Nausea And Vomiting    Social History:  The patient  reports that he quit smoking about a year ago. His smoking use included Cigarettes. He has a 5 pack-year smoking history. He has never used smokeless tobacco. He reports that he does not drink alcohol or use illicit drugs.   Family history:   Family History  Problem Relation Age of Onset  . CAD      ROS:  Please see the history of present illness.  All other systems reviewed and negative.   PHYSICAL EXAM: VS:  BP 118/80 mmHg  Pulse 64  Ht _0  (1.803 m)  Wt 152 lb 4.8 oz (69.083 kg)  BMI 21.25 kg/m2 Well nourished, well developed, in no acute distress HEENT: Pupils are equal round react to light accommodation extraocular movements are intact.  Neck: Positive JVDNo cervical lymphadenopathy. Cardiac: Regular rate and rhythm 2/6 systolic murmur Lungs:  clear to auscultation bilaterally, no wheezing, rhonchi or rales Abd: soft, nontender, positive bowel sounds all quadrants, no hepatosplenomegaly.  Abdominal bruit loudest in the right side Ext: 2+ lower extremity edema. 1+ radial and 2+ dorsalis pedis  pulses. Skin: warm and dry Neuro:  Grossly normal     ASSESSMENT AND PLAN:  Problem List Items Addressed This Visit    Moderate aortic stenosis    Continue to monitor. Patient is on likely surgical candidate given his significant vascular disease history    Relevant Medications      warfarin (COUMADIN) 4 MG tablet      furosemide (LASIX) tablet   Long term current use of anticoagulant therapy    INR 3.9 today according to the patient. This is being managed at rehabilitation. Will check INR on Monday    Hyperlipidemia    Lipid Panel     Component Value Date/Time   CHOL 243* 10/31/2013 0819   TRIG 102.0 10/31/2013 0819  HDL 40.90 10/31/2013 0819   CHOLHDL 6 10/31/2013 0819   VLDL 20.4 10/31/2013 0819   LDLCALC 182* 10/31/2013 0819   LDLDIRECT 186.1 09/21/2012 0754    Continue Crestor and Zetia    Relevant Medications      warfarin (COUMADIN) 4 MG tablet      furosemide (LASIX) tablet   Essential hypertension    Pressure well-controlled    Relevant Medications      warfarin (COUMADIN) 4 MG tablet      furosemide (LASIX) tablet   CKD (chronic kidney disease), stage III    B basic metabolic panel Monday    Acute on chronic systolic CHF (congestive heart failure), NYHA class 4    Patient is a 12 pounds since November 30. I'm increasing his Lasix to 40 mg daily and adding 10 mEq of potassium. We'll recheck a be met on Monday along with his INR. The patient reports that his INR today was 3.9.  We reemphasized daily weight monitoring and low-sodium diet.    Relevant Medications      warfarin (COUMADIN) 4 MG tablet      furosemide (LASIX) tablet    Other Visit Diagnoses    Polypharmacy    -  Primary    Relevant Orders       Basic metabolic panel

## 2014-06-11 NOTE — Assessment & Plan Note (Signed)
B basic metabolic panel Monday

## 2014-06-11 NOTE — Patient Instructions (Signed)
Please [facility] an appointment [facility] for Monday, 12/21.  Please have some blood work completed on [facility]  Please follow up with an extender in 2-3 weeks.   INCREASE Lasix to 40 MG daily  START 10 mEq of Potassium daily  Your physician recommends that you weigh, daily, at the same time every day, and in the same amount of clothing. Please record your daily weights on the handout provided and bring it to your next appointment. Please call back if you gain 3 lbs in 24 hours or 5 lbs in 1 week.   Low-Sodium Eating Plan Sodium raises blood pressure and causes water to be held in the body. Getting less sodium from food will help lower your blood pressure, reduce any swelling, and protect your heart, liver, and kidneys. We get sodium by adding salt (sodium chloride) to food. Most of our sodium comes from canned, boxed, and frozen foods. Restaurant foods, fast foods, and pizza are also very high in sodium. Even if you take medicine to lower your blood pressure or to reduce fluid in your body, getting less sodium from your food is important. WHAT IS MY PLAN? Most people should limit their sodium intake to 2,300 mg a day. Your health care provider recommends that you limit your sodium intake to __________ a day.  WHAT DO I NEED TO KNOW ABOUT THIS EATING PLAN? For the low-sodium eating plan, you will follow these general guidelines:  Choose foods with a % Daily Value for sodium of less than 5% (as listed on the food label).   Use salt-free seasonings or herbs instead of table salt or sea salt.   Check with your health care provider or pharmacist before using salt substitutes.   Eat fresh foods.  Eat more vegetables and fruits.  Limit canned vegetables. If you do use them, rinse them well to decrease the sodium.   Limit cheese to 1 oz (28 g) per day.   Eat lower-sodium products, often labeled as "lower sodium" or "no salt  added."  Avoid foods that contain monosodium glutamate (MSG). MSG is sometimes added to [place] food and some canned foods.  Check food labels (Nutrition Facts labels) on foods to learn how much sodium is in one serving.  Eat more home-cooked food and less restaurant, buffet, and fast food.  When eating at a restaurant, ask that your food be prepared with less salt or none, if possible.  HOW DO I READ FOOD LABELS FOR SODIUM INFORMATION? The Nutrition Facts label lists the amount of sodium in one serving of the food. If you eat more than one serving, you must multiply the listed amount of sodium by the number of servings. Food labels may also identify foods as:  Sodium free--Less than 5 mg in a serving.  Very low sodium--35 mg or less in a serving.  Low sodium--140 mg or less in a serving.  Light in sodium--50% less sodium in a serving. For example, if a food that usually has 300 mg of sodium is changed to become light in sodium, it will have 150 mg of sodium.  Reduced sodium--25% less sodium in a serving. For example, if a food that usually has 400 mg of sodium is changed to reduced sodium, it will have 300 mg of sodium. WHAT FOODS CAN I EAT? Grains Low-sodium cereals, including oats, puffed wheat and rice, and shredded wheat cereals. Low-sodium crackers. Unsalted rice and pasta. Lower-sodium bread.  Vegetables Frozen or fresh vegetables. Low-sodium or reduced-sodium canned vegetables. Low-sodium or reduced-sodium tomato sauce and paste. Low-sodium or reduced-sodium tomato and vegetable juices.  Fruits Fresh, frozen, and canned fruit. Fruit juice.  Meat and Other Protein Products Low-sodium canned tuna and salmon. Fresh or frozen meat, poultry, seafood, and fish. Lamb. Unsalted nuts. Dried beans, peas, and lentils without added salt. Unsalted canned beans. Homemade soups without salt. Eggs.  Dairy Milk. Soy milk. Ricotta cheese. Low-sodium or reduced-sodium cheeses. Yogurt.   Condiments Fresh and dried herbs and spices. Salt-free seasonings. Onion and garlic powders. Low-sodium varieties of mustard and ketchup. Lemon juice.  Fats and Oils Reduced-sodium salad dressings. Unsalted butter.  Other Unsalted popcorn and pretzels.  The items listed above may not be a complete list of recommended foods or beverages. Contact your dietitian for more options. WHAT FOODS ARE NOT RECOMMENDED? Grains Instant hot cereals. Bread stuffing, pancake, and biscuit mixes. Croutons. Seasoned rice or pasta mixes. Noodle soup cups. Boxed or frozen macaroni and cheese. Self-rising flour. Regular salted crackers. Vegetables Regular canned vegetables. Regular canned tomato sauce and paste. Regular tomato and vegetable juices. Frozen vegetables in sauces. Salted french fries. Olives. Rosita FirePickles. Relishes. Sauerkraut. Salsa. Meat and Other Protein Products Salted, canned, smoked, spiced, or pickled meats, seafood, or fish. Bacon, ham, sausage, hot dogs, corned beef, chipped beef, and packaged luncheon meats. Salt pork. Jerky. Pickled herring. Anchovies, regular canned tuna, and sardines. Salted nuts. Dairy Processed cheese and cheese spreads. Cheese curds. Blue cheese and cottage cheese. Buttermilk.  Condiments Onion and garlic salt, seasoned salt, table salt, and sea salt. Canned and packaged gravies. Worcestershire sauce. Tartar sauce. Barbecue sauce. Teriyaki sauce. Soy sauce, including reduced sodium. Steak sauce. Fish sauce. Oyster sauce. Cocktail sauce. Horseradish. Regular ketchup and mustard. Meat flavorings and tenderizers. Bouillon cubes. Hot sauce. Tabasco sauce. Marinades. Taco seasonings. Relishes. Fats and Oils Regular salad dressings. Salted butter. Margarine. Ghee. Bacon fat.  Other Potato and tortilla chips. Corn chips and puffs. Salted popcorn and pretzels. Canned or dried soups. Pizza. Frozen entrees and pot pies.  The items listed above may not be a complete list  of foods and beverages to avoid. Contact your dietitian for more information. Document Released: 12/03/2001 Document Revised: 06/18/2013 Document Reviewed: 04/17/2013 [facility] Patient Information 2015 [employer] [place]. This information is not intended to replace advice given to you by your health care provider. Make sure you discuss any questions you have with your health care provider.

## 2014-06-11 NOTE — Progress Notes (Signed)
Subjective:    Patient ID: Clinton [name], male    DOB: 28-Jul-1940, 73 y.o.   MRN: [id number]  HPI 73 yo male former smoker with Ischemic CM , Chronic systolic failure with EF 20-25% seen for pulmonary consult during hospitalization for RLL CAP (NOS) , probable right parapneumonic pleural effuison , hemoptysis  and lung nodules   06/10/14 Post Hospital follow up  Patient returns for a post hospital follow-up. Patient was seen for a pulmonary consult during recent hospitalization for a right lower lobe pneumonia with a probable right parapneumonic pleural effusion. Patient was admitted with shortness of breath and hemoptysis. He has a known history of ischemic cardiomyopathy and congestive heart failure and is on chronic Coumadin therapy On admission his INR was was greater than 8. CT chest showed a right lower lobe consolidation with a associated moderate effusion. Patient had 3 pulmonary nodules on the left with the largest measuring 16 mm. He had a mildly enlarged right mediastinal lymph node Was treated with aggressive IV antibiotics.  Coumadin was adjusted and INR trended down to a subtherapeutic range. Hemoptysis improved. Says last INR was 3.5 , coumadin adjusted.  Was discharged to Rehab center, has few days left there until back to home.  Since discharge he is feeling better but still weak.  Reports still having some weakness, prod cough with clear mucus streaked w/ BRB and tightness.  Denies wheezing, f/c/s, n/v/d, PND, increased leg swelling  Patient smoked for approximately 45 years . He quit 1 month prior to admission.smoked 1 PPD . 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  No previous PFT . No formal dx of COPD.      Review of Systems Constitutional:   No  weight loss, night sweats,  Fevers, chills,  +fatigue, or  lassitude.  HEENT:   No headaches,  Difficulty swallowing,  Tooth/dental problems, or  Sore throat,                No sneezing, itching, ear  ache, nasal congestion, post nasal drip,   CV:  No chest pain,  Orthopnea, PND, swelling in lower extremities, anasarca, dizziness, palpitations, syncope.   GI  No heartburn, indigestion, abdominal pain, nausea, vomiting, diarrhea, change in bowel habits, loss of appetite, bloody stools.   Resp:   No chest wall deformity  Skin: no rash or lesions.  GU: no dysuria, change in color of urine, no urgency or frequency.  No flank pain, no hematuria   MS:  No joint pain or swelling.  No decreased range of motion.  No back pain.  Psych:  No change in mood or affect. No depression or anxiety.  No memory loss.         Objective:   Physical Exam GEN: A/Ox3; pleasant , NAD, elderly   HEENT:  Strong/AT,  EACs-clear, TMs-wnl, NOSE-clear, THROAT-clear, no lesions, no postnasal drip or exudate noted.   NECK:  Supple w/ fair ROM; no JVD; normal carotid impulses w/o bruits; no thyromegaly or nodules palpated; no lymphadenopathy.  RESP  Clear  P & A; w/o, wheezes/ rales/ or rhonchi.no accessory muscle use, no dullness to percussion  CARD:  RRR, 2/6 SM   , 1+ peripheral edema, pulses intact, no cyanosis or clubbing.  GI:   Soft & nt; nml bowel sounds; no organomegaly or masses detected.  Musco: Warm bil, no deformities or joint swelling noted.   Neuro: alert, no focal deficits noted.    Skin: Warm, no lesions  or rashes  CT chest 05/22/14 Right lower lobe pneumonia or pneumonitis with moderate associated effusion 2. Three pulmonary nodules on the left, the largest measuring 16 mm. Malignancy is not excluded. PET-CT could be considered. 3. Mildly enlarged right mediastinal lymph node of uncertain origin, with numerous nonpathologic lymph nodes also identified. Small left pleural effusion.  CXR 06/10/14    Persistent infiltrate right lower lobe consistent with pneumonia. Persistent right pleural effusion. Previously described pulmonary nodules on CT report 05/22/2014 not well-visualized by  standard chest x-ray. Reference is made to prior CT report. 2. Mild component of congestive heart failure and pulmonary interstitial edema may be present. Prior CABG. Assessment & Plan:

## 2014-06-11 NOTE — Assessment & Plan Note (Signed)
Patient is a 12 pounds since November 30. I'm increasing his Lasix to 40 mg daily and adding 10 mEq of potassium. We'll recheck a be met on Monday along with his INR. The patient reports that his INR today was 3.9.  We reemphasized daily weight monitoring and low-sodium diet.

## 2014-06-11 NOTE — Assessment & Plan Note (Signed)
Continue to monitor. Patient is on likely surgical candidate given his significant vascular disease history

## 2014-06-11 NOTE — Assessment & Plan Note (Signed)
CT chest 11/26/15Right lower lobe pneumonia or pneumonitis with moderate associated effusion 2. Three pulmonary nodules on the left, the largest measuring 16 mm. Malignancy is not excluded. PET-CT could be considered. 3. Mildly enlarged right mediastinal lymph node of uncertain origin, with numerous nonpathologic lymph nodes also identified. Small left pleural effusion.  Persistent consolidation in RLL , previous nodules on CT  Will repeat CT chest in 2 weeks to look more closely at this area post abx and to look at nodules for any changes  May need PET +/- FOB

## 2014-06-11 NOTE — Assessment & Plan Note (Signed)
Clinically improving  CXR with persistent aspdz in RLL /effusion ? Lag time   Plan  Continue on current regimen  We are setting you up for a CT chest in 2 weeks  Follow up with Dr. Vassie LollAlva  In 4 weeks with PFT

## 2014-06-12 ENCOUNTER — Encounter: Payer: Self-pay | Admitting: Adult Health

## 2014-06-12 ENCOUNTER — Non-Acute Institutional Stay (SKILLED_NURSING_FACILITY): Payer: Medicare HMO | Admitting: Adult Health

## 2014-06-12 DIAGNOSIS — E039 Hypothyroidism, unspecified: Secondary | ICD-10-CM

## 2014-06-12 DIAGNOSIS — I5023 Acute on chronic systolic (congestive) heart failure: Secondary | ICD-10-CM

## 2014-06-12 DIAGNOSIS — I10 Essential (primary) hypertension: Secondary | ICD-10-CM

## 2014-06-12 DIAGNOSIS — E46 Unspecified protein-calorie malnutrition: Secondary | ICD-10-CM

## 2014-06-12 DIAGNOSIS — R911 Solitary pulmonary nodule: Secondary | ICD-10-CM

## 2014-06-12 DIAGNOSIS — E785 Hyperlipidemia, unspecified: Secondary | ICD-10-CM

## 2014-06-12 DIAGNOSIS — Z7901 Long term (current) use of anticoagulants: Secondary | ICD-10-CM

## 2014-06-12 NOTE — Progress Notes (Addendum)
Patient ID: Clinton [name], male   DOB: Apr 24, 1941, 73 y.o.   MRN: [id number]   05/27/14  Facility:  Nursing Home Location:  [name] Room Number: [id number] LEVEL OF CARE:  SNF (31)   Chief Complaint  Patient presents with  . Hospitalization Follow-up    Generalized weakness, pneumonia, hypertension, CHF, hyperlipidemia, hypothyroidism and pulmonary nodule    HISTORY OF PRESENT ILLNESS:  This is a 73 year old male who was been admitted to [facility] on 05/26/14 from [facility] with Community acquired pneumonia. He has been admitted for a short-term rehabilitation.  REASSESSMENT OF ONGOING PROBLEMS:  HTN: Denies CP, sob, DOE, pedal edema, headaches, dizziness or visual disturbances.   Last BP : 90/60  HYPERLIPIDEMIA: No complications from the medications presently being used. 5/15 fasting lipid panel showed : Cholesterol 243 triglycerides 102.0 HDL 40.9 the LDL 182  CHF:The patient does not relate significant weight changes, denies sob, DOE, orthopnea, PNDs, pedal edema, palpitations or chest pain.  CHF remains stable.  No complications form the medications being used.  PAST MEDICAL HISTORY:  Past Medical History  Diagnosis Date  . Peripheral arterial disease     a. Ao-bifem bypass w/ 16x8 hemashield dacron graft, R aortorenal bypass w/ 6mm dacron graft;  b. 09/2011 ABI: R 0/87, L 0.92.  . Carotid artery occlusion     a. 12/2002 R CEA;  b. 05/2007 L Carotid stenting;  c. 09/2008 repeat L carotid stenting 2/2 ISR;  d. 09/2011 Carotid U/S: RICA 40-59%, LICA 60-79%, >50 dist LCCA stenosis.  Marland Kitchen CAD (coronary artery disease)     a. CABG 1985;  b. 05/2002 Rotablator/PTCA to ostial LCX and RI;  c. 12/2009 MV: inf and lat infarct w/ minimal mid lateral and basilar ant ischemia, EF 29%->Med Rx.  . Cardiomyopathy, ischemic     a. 06/2010 Cardiac MRI: EF 30%, anterolat, post, inf prior subendocardial infarcts;  b. 08/2012 Echo: EF 30-35%, mild LVH mult  wma's, Gr 2 DD;  c. 05/2013 Echo: EF 20-25%, diff HK, mod AS, mild MR, mod dil LA, mild to mod TR, PASP .  . Stroke     remote  . Hypertension   . Hyperlipidemia   . Tobacco abuse   . Anticoagulated on Coumadin   . CKD (chronic kidney disease), stage III   . Chronic systolic CHF (congestive heart failure)     a. 05/2013 EF 20-25%, diff HK.  . Moderate aortic stenosis     a. 05/2013 Echo: at least moderate AS (Valve area 1.07 cm2 - VTI; 1.08cm2 - Vmax).  . Hypothyroidism     a. 05/2013 TSH 50.15 - synthroid 50 mcg daily started.    CURRENT MEDICATIONS: Reviewed per MAR/see medication list  Allergies  Allergen Reactions  . Codeine Phosphate Nausea And Vomiting     REVIEW OF SYSTEMS:  GENERAL: no change in appetite, no fatigue, no weight changes, no fever, chills or weakness RESPIRATORY: no cough, SOB, DOE, wheezing, hemoptysis CARDIAC: no chest pain, edema or palpitations GI: no abdominal pain, diarrhea, constipation, heart burn, nausea or vomiting  PHYSICAL EXAMINATION  GENERAL: no acute distress, normal body habitus EYES: conjunctivae normal, sclerae normal, normal eye lids NECK: supple, trachea midline, no neck masses, no thyroid tenderness, no thyromegaly LYMPHATICS: no LAN in the neck, no supraclavicular LAN RESPIRATORY: breathing is even & unlabored, BS CTAB CARDIAC: RRR, no murmur,no extra heart sounds, no edema GI: abdomen soft, normal BS, no masses, no tenderness, no hepatomegaly,  no splenomegaly EXTREMITIES: Able to move 4 extremities PSYCHIATRIC: the patient is alert & oriented to person, affect & behavior appropriate  LABS/RADIOLOGY: Labs reviewed: Basic Metabolic Panel:  Recent Labs  16/03/9610/17/15 0740 05/14/14 0356 05/15/14 0500  05/21/14 0521 05/23/14 0504 05/24/14 0459  NA 126* 126* 133*  < > 137 139 137  K 3.3* 4.0 3.9  < > 4.4 4.7 4.7  CL 86* 88* 95*  < > 102 103 102  CO2 20 17* 18*  < > 23 23 23   GLUCOSE 102* 110* 145*  < > 106* 95 80    BUN 93* 95* 97*  < > 59* 45* 40*  CREATININE 3.48* 3.83* 3.52*  < > 1.92* 1.68* 1.50*  CALCIUM 8.0* 8.3* 7.9*  < > 8.1* 8.2* 8.3*  MG 2.3 2.5 2.3  --   --   --   --   < > = values in this interval not displayed. Liver Function Tests:  Recent Labs  05/17/14 0425 05/21/14 0521 05/23/14 0504  AST 67* 20 20  ALT 116* 43 32  ALKPHOS 261* 267* 266*  BILITOT 0.9 0.9 0.8  PROT 6.1 6.2 6.5  ALBUMIN 2.3* 2.4* 2.6*   CBC:  Recent Labs  10/31/13 0819  05/13/14 0740 05/13/14 1920  05/21/14 0521 05/23/14 0504 05/24/14 0459  WBC 8.1  < > 13.4* 11.6*  < > 14.5* 13.0* 11.0*  NEUTROABS 6.5  --  12.5* 10.8*  --   --   --   --   HGB 12.8*  < > 10.0* 9.6*  < > 11.5* 11.4* 11.5*  HCT 38.6*  < > 29.1* 27.6*  < > 35.1* 35.7* 35.8*  MCV 84.9  < > 80.6 79.8  < > 85.0 84.0 86.1  PLT 182.0  < > 156 159  < > 191 170 145*  < > = values in this interval not displayed.  Lipid Panel:  Recent Labs  10/31/13 0819  HDL 40.90   Cardiac Enzymes:  Recent Labs  05/10/14 1910 05/10/14 2310 05/11/14 0454  TROPONINI 2.15* 3.63* 2.96*    Dg Chest 2 View  06/10/2014   CLINICAL DATA:  Pneumonia.  Hypertension.  EXAM: CHEST  2 VIEW  COMPARISON:  05/19/2014.  FINDINGS: Prior CABG. Cardiomegaly with mild pulmonary vascular prominence. Diffuse mild interstitial prominence. A component of congestive heart failure cannot be excluded. Persistent prominent infiltrate right lower lobe with right pleural effusion. No pneumothorax. Pulmonary nodules noted on prior CT 05/22/2014 not well demonstrated by standard chest x-ray. Reference is made to that CT report .  IMPRESSION: 1. Persistent infiltrate right lower lobe consistent with pneumonia. Persistent right pleural effusion. Previously described pulmonary nodules on CT report 05/22/2014 not well-visualized by standard chest x-ray. Reference is made to prior CT report. 2. Mild component of congestive heart failure and pulmonary interstitial edema may be present.  Prior CABG.   Electronically Signed   By: Maisie Fushomas  Register   On: 06/10/2014 14:21   Ct Chest Wo Contrast  05/22/2014   CLINICAL DATA:  Elevated creatinine, hemoptysis, initial evaluation  EXAM: CT CHEST WITHOUT CONTRAST  TECHNIQUE: Multidetector CT imaging of the chest was performed following the standard protocol without IV contrast.  COMPARISON:  CT abdomen pelvis performed 05/10/2014  FINDINGS: There is heavy calcification of the thoracic aorta. There is moderate cardiac enlargement.  There is a moderate right pleural effusion much of which is subpulmonic. There is dense consolidation in the posterior right lower lobe with air bronchograms. No  mass is seen on the right.  On the left, there is mild dependent atelectasis. There is a small left pleural effusion. Laterally in the left lower lobe there is 16 mm ground-glass attenuation nodule. Just cranial to this is a 6 mm peripheral pulmonary nodule. Just cranial to this is another pulmonary nodule in the central left lower lobe measuring 5 mm.  There is mild centrilobular emphysema in the upper lung zones. There are small mediastinal lymph nodes it. Additionally there is a 13 mm right precarinal lymph node. There are no acute findings seen on images through the upper abdomen.  IMPRESSION: 1. Right lower lobe pneumonia or pneumonitis with moderate associated effusion 2. Three pulmonary nodules on the left, the largest measuring 16 mm. Malignancy is not excluded. PET-CT could be considered. 3. Mildly enlarged right mediastinal lymph node of uncertain origin, with numerous nonpathologic lymph nodes also identified. Small left pleural effusion.   Electronically Signed   By: Esperanza Heiraymond  Rubner M.D.   On: 05/22/2014 11:13   Dg Chest Port 1 View  05/19/2014   CLINICAL DATA:  Hemoptysis and difficulty breathing  EXAM: PORTABLE CHEST - 1 VIEW  COMPARISON:  May 11, 2014  FINDINGS: There is now a central catheter with tip in the superior vena cava. No pneumothorax.  There is underlying emphysematous change. There is persistent right effusion with right middle and lower lobe patchy infiltrate. There is no edema or consolidation the left. Heart is enlarged with pulmonary vascularity within normal limits. Patient is status post internal mammary bypass grafting. No adenopathy. There is a stent in the left carotid region, stable.  IMPRESSION: Central catheter tip in superior vena cava. No pneumothorax. There is patchy infiltrate in portions of the right middle and lower lobes with right effusion. Underlying emphysema. Stable cardiac enlargement.   Electronically Signed   By: Bretta BangWilliam  Woodruff M.D.   On: 05/19/2014 08:07    ASSESSMENT/PLAN:  Generalized weakness - for rehabilitation CAP (community-acquired pneumonia) - completed antibiotic; continue Mucinex DM 600-30 mg  by mouth twice a day Hypertension - decrease Apresoline to 25 mg by mouth 3 times a day; BP/heart rate every shift 1 week; continue Imdur 30 mg by mouth daily and Coreg 18.75 mg by mouth twice a day Chronic systolic CHF - stable; continue Lasix 20 mg by mouth daily, Coreg and Imdur Hyperlipidemia - continue Zetia 10 mg by mouth daily and Crestor 40 mg by mouth daily Hypothyroidism - continue Synthroid 50 g by mouth daily Pulmonary nodule - pending outpatient follow-up with pulmonary with repeat CT chest   Goals of care:  Short-term rehabilitation   Labs/test ordered:  none    Spent 50 minutes in patient care.     [facility] Memorial HospitalMEDINA-VARGAS,Karriem Muench, NP [facility] [phone]

## 2014-06-12 NOTE — Progress Notes (Signed)
Patient ID: Clinton [name], male   DOB: 19-Aug-1940, 73 y.o.   MRN: [id number]   06/12/14  Facility:  Nursing Home Location:  [name] Room Number: 1007-P LEVEL OF CARE:  SNF ([id number])   Chief Complaint  Patient presents with  . Discharge Note    Hypertension, chronic systolic CHF, hyperlipidemia, hypothyroidism, long-term use of anticoagulant and pulmonary nodule    HISTORY OF PRESENT ILLNESS:  This is a 73 year old male who is for discharge home. He has been been admitted to [facility] on 05/26/14 from [facility] with Community acquired pneumonia. Patient was admitted to this facility for short-term rehabilitation after the patient's recent hospitalization.  Patient has completed SNF rehabilitation and therapy has cleared the patient for discharge.  REASSESSMENT OF ONGOING PROBLEMS:  HTN: Pt 's HTN remains stable.  Denies CP, sob, DOE, pedal edema, headaches, dizziness or visual disturbances.  No complications from the medications currently being used.  Last BP : 120/76  HYPERLIPIDEMIA: No complications from the medications presently being used. 5/15 fasting lipid panel showed : Cholesterol 243 triglycerides 102.0 HDL 40.9 the LDL 182  CHF:The patient does not relate significant weight changes, denies sob, DOE, orthopnea, PNDs, pedal edema, palpitations or chest pain.  CHF remains stable.  No complications form the medications being used.  PAST MEDICAL HISTORY:  Past Medical History  Diagnosis Date  . Peripheral arterial disease     a. Ao-bifem bypass w/ 16x8 hemashield dacron graft, R aortorenal bypass w/ 6mm dacron graft;  b. 09/2011 ABI: R 0/87, L 0.92.  . Carotid artery occlusion     a. 12/2002 R CEA;  b. 05/2007 L Carotid stenting;  c. 09/2008 repeat L carotid stenting 2/2 ISR;  d. 09/2011 Carotid U/S: RICA 40-59%, LICA 60-79%, >50 dist LCCA stenosis.  Marland Kitchen CAD (coronary artery disease)     a. CABG 1985;  b. 05/2002 Rotablator/PTCA to ostial  LCX and RI;  c. 12/2009 MV: inf and lat infarct w/ minimal mid lateral and basilar ant ischemia, EF 29%->Med Rx.  . Cardiomyopathy, ischemic     a. 06/2010 Cardiac MRI: EF 30%, anterolat, post, inf prior subendocardial infarcts;  b. 08/2012 Echo: EF 30-35%, mild LVH mult wma's, Gr 2 DD;  c. 05/2013 Echo: EF 20-25%, diff HK, mod AS, mild MR, mod dil LA, mild to mod TR, PASP .  . Stroke     remote  . Hypertension   . Hyperlipidemia   . Tobacco abuse   . Anticoagulated on Coumadin   . CKD (chronic kidney disease), stage III   . Chronic systolic CHF (congestive heart failure)     a. 05/2013 EF 20-25%, diff HK.  . Moderate aortic stenosis     a. 05/2013 Echo: at least moderate AS (Valve area 1.07 cm2 - VTI; 1.08cm2 - Vmax).  . Hypothyroidism     a. 05/2013 TSH 50.15 - synthroid 50 mcg daily started.    CURRENT MEDICATIONS: Reviewed per MAR/see medication list  Allergies  Allergen Reactions  . Codeine Phosphate Nausea And Vomiting     REVIEW OF SYSTEMS:  GENERAL: no change in appetite, no fatigue, no weight changes, no fever, chills or weakness RESPIRATORY: no cough, SOB, DOE, wheezing, hemoptysis CARDIAC: no chest pain, edema or palpitations GI: no abdominal pain, diarrhea, constipation, heart burn, nausea or vomiting  PHYSICAL EXAMINATION  GENERAL: no acute distress, normal body habitus NECK: supple, trachea midline, no neck masses, no thyroid tenderness, no thyromegaly LYMPHATICS: no  LAN in the neck, no supraclavicular LAN RESPIRATORY: breathing is even & unlabored, BS CTAB CARDIAC: RRR, no murmur,no extra heart sounds, no edema GI: abdomen soft, normal BS, no masses, no tenderness, no hepatomegaly, no splenomegaly EXTREMITIES: Able to move 4 extremities; walks without assistance PSYCHIATRIC: the patient is alert & oriented to person, affect & behavior appropriate  LABS/RADIOLOGY: 06/02/14  WBC 6.3 hemoglobin 11.2 hematocrit 36.1 MCV 88.7 05/29/14  sodium 133 potassium  4.3 glucose 83 BUN 34 creatinine 1.7 calcium 9.0 Labs reviewed: Basic Metabolic Panel:  Recent Labs  40/98/1111/17/15 0740 05/14/14 0356 05/15/14 0500  05/21/14 0521 05/23/14 0504 05/24/14 0459  NA 126* 126* 133*  < > 137 139 137  K 3.3* 4.0 3.9  < > 4.4 4.7 4.7  CL 86* 88* 95*  < > 102 103 102  CO2 20 17* 18*  < > 23 23 23   GLUCOSE 102* 110* 145*  < > 106* 95 80  BUN 93* 95* 97*  < > 59* 45* 40*  CREATININE 3.48* 3.83* 3.52*  < > 1.92* 1.68* 1.50*  CALCIUM 8.0* 8.3* 7.9*  < > 8.1* 8.2* 8.3*  MG 2.3 2.5 2.3  --   --   --   --   < > = values in this interval not displayed. Liver Function Tests:  Recent Labs  05/17/14 0425 05/21/14 0521 05/23/14 0504  AST 67* 20 20  ALT 116* 43 32  ALKPHOS 261* 267* 266*  BILITOT 0.9 0.9 0.8  PROT 6.1 6.2 6.5  ALBUMIN 2.3* 2.4* 2.6*   CBC:  Recent Labs  10/31/13 0819  05/13/14 0740 05/13/14 1920  05/21/14 0521 05/23/14 0504 05/24/14 0459  WBC 8.1  < > 13.4* 11.6*  < > 14.5* 13.0* 11.0*  NEUTROABS 6.5  --  12.5* 10.8*  --   --   --   --   HGB 12.8*  < > 10.0* 9.6*  < > 11.5* 11.4* 11.5*  HCT 38.6*  < > 29.1* 27.6*  < > 35.1* 35.7* 35.8*  MCV 84.9  < > 80.6 79.8  < > 85.0 84.0 86.1  PLT 182.0  < > 156 159  < > 191 170 145*  < > = values in this interval not displayed.  Lipid Panel:  Recent Labs  10/31/13 0819  HDL 40.90   Cardiac Enzymes:  Recent Labs  05/10/14 1910 05/10/14 2310 05/11/14 0454  TROPONINI 2.15* 3.63* 2.96*    Dg Chest 2 View  06/10/2014   CLINICAL DATA:  Pneumonia.  Hypertension.  EXAM: CHEST  2 VIEW  COMPARISON:  05/19/2014.  FINDINGS: Prior CABG. Cardiomegaly with mild pulmonary vascular prominence. Diffuse mild interstitial prominence. A component of congestive heart failure cannot be excluded. Persistent prominent infiltrate right lower lobe with right pleural effusion. No pneumothorax. Pulmonary nodules noted on prior CT 05/22/2014 not well demonstrated by standard chest x-ray. Reference is made to  that CT report .  IMPRESSION: 1. Persistent infiltrate right lower lobe consistent with pneumonia. Persistent right pleural effusion. Previously described pulmonary nodules on CT report 05/22/2014 not well-visualized by standard chest x-ray. Reference is made to prior CT report. 2. Mild component of congestive heart failure and pulmonary interstitial edema may be present. Prior CABG.   Electronically Signed   By: Maisie Fushomas  Register   On: 06/10/2014 14:21   Ct Chest Wo Contrast  05/22/2014   CLINICAL DATA:  Elevated creatinine, hemoptysis, initial evaluation  EXAM: CT CHEST WITHOUT CONTRAST  TECHNIQUE: Multidetector CT imaging of  the chest was performed following the standard protocol without IV contrast.  COMPARISON:  CT abdomen pelvis performed 05/10/2014  FINDINGS: There is heavy calcification of the thoracic aorta. There is moderate cardiac enlargement.  There is a moderate right pleural effusion much of which is subpulmonic. There is dense consolidation in the posterior right lower lobe with air bronchograms. No mass is seen on the right.  On the left, there is mild dependent atelectasis. There is a small left pleural effusion. Laterally in the left lower lobe there is 16 mm ground-glass attenuation nodule. Just cranial to this is a 6 mm peripheral pulmonary nodule. Just cranial to this is another pulmonary nodule in the central left lower lobe measuring 5 mm.  There is mild centrilobular emphysema in the upper lung zones. There are small mediastinal lymph nodes it. Additionally there is a 13 mm right precarinal lymph node. There are no acute findings seen on images through the upper abdomen.  IMPRESSION: 1. Right lower lobe pneumonia or pneumonitis with moderate associated effusion 2. Three pulmonary nodules on the left, the largest measuring 16 mm. Malignancy is not excluded. PET-CT could be considered. 3. Mildly enlarged right mediastinal lymph node of uncertain origin, with numerous nonpathologic lymph  nodes also identified. Small left pleural effusion.   Electronically Signed   By: Esperanza Heiraymond  Rubner M.D.   On: 05/22/2014 11:13   Dg Chest Port 1 View  05/19/2014   CLINICAL DATA:  Hemoptysis and difficulty breathing  EXAM: PORTABLE CHEST - 1 VIEW  COMPARISON:  May 11, 2014  FINDINGS: There is now a central catheter with tip in the superior vena cava. No pneumothorax. There is underlying emphysematous change. There is persistent right effusion with right middle and lower lobe patchy infiltrate. There is no edema or consolidation the left. Heart is enlarged with pulmonary vascularity within normal limits. Patient is status post internal mammary bypass grafting. No adenopathy. There is a stent in the left carotid region, stable.  IMPRESSION: Central catheter tip in superior vena cava. No pneumothorax. There is patchy infiltrate in portions of the right middle and lower lobes with right effusion. Underlying emphysema. Stable cardiac enlargement.   Electronically Signed   By: Bretta BangWilliam  Woodruff M.D.   On: 05/19/2014 08:07    ASSESSMENT/PLAN:  Hypertension - continue Apresoline to 25 mg by mouth 3 times a day, Imdur 30 mg by mouth daily and Coreg 18.75 mg by mouth twice a day Chronic systolic CHF - stable; recently increased Lasix 40 mg by mouth daily, continue Coreg and Imdur Hyperlipidemia - continue Zetia 10 mg by mouth daily and Crestor 40 mg by mouth daily Hypothyroidism - continue Synthroid 50 g by mouth daily Pulmonary nodule - pending outpatient follow-up with pulmonary with repeat CT chest Long-term use of anticoagulant - INR 3.6 - supratherapeutic; hold Coumadin today and check INR on 06/13/14 Protein calorie malnutrition - continue supplementation  I have filled out patient's discharge paperwork and written prescriptions.    Total discharge time: Less than 30 minutes  Discharge time involved coordination of the discharge process with Child psychotherapistsocial worker, nursing staff and therapy department.       [facility] IncMEDINA-VARGAS,MONINA, NP [facility] [phone]

## 2014-06-13 ENCOUNTER — Ambulatory Visit (INDEPENDENT_AMBULATORY_CARE_PROVIDER_SITE_OTHER): Payer: Medicare HMO | Admitting: Pharmacist

## 2014-06-13 ENCOUNTER — Other Ambulatory Visit (INDEPENDENT_AMBULATORY_CARE_PROVIDER_SITE_OTHER): Payer: Medicare HMO | Admitting: *Deleted

## 2014-06-13 DIAGNOSIS — I10 Essential (primary) hypertension: Secondary | ICD-10-CM

## 2014-06-13 DIAGNOSIS — Z7901 Long term (current) use of anticoagulants: Secondary | ICD-10-CM

## 2014-06-13 DIAGNOSIS — I639 Cerebral infarction, unspecified: Secondary | ICD-10-CM

## 2014-06-13 DIAGNOSIS — I340 Nonrheumatic mitral (valve) insufficiency: Secondary | ICD-10-CM

## 2014-06-13 DIAGNOSIS — I6350 Cerebral infarction due to unspecified occlusion or stenosis of unspecified cerebral artery: Secondary | ICD-10-CM

## 2014-06-13 DIAGNOSIS — Z5181 Encounter for therapeutic drug level monitoring: Secondary | ICD-10-CM

## 2014-06-13 LAB — BASIC METABOLIC PANEL
BUN: 33 mg/dL — ABNORMAL HIGH (ref 6–23)
CO2: 22 meq/L (ref 19–32)
Calcium: 8.9 mg/dL (ref 8.4–10.5)
Chloride: 103 mEq/L (ref 96–112)
Creatinine, Ser: 2.1 mg/dL — ABNORMAL HIGH (ref 0.4–1.5)
GFR: 33.87 mL/min — AB (ref >60.00)
Glucose, Bld: 112 mg/dL — ABNORMAL HIGH (ref 70–99)
POTASSIUM: 4.3 meq/L (ref 3.5–5.1)
SODIUM: 134 meq/L — AB (ref 135–145)

## 2014-06-13 LAB — POCT INR: INR: 2.1

## 2014-06-16 ENCOUNTER — Other Ambulatory Visit: Payer: Medicare HMO

## 2014-06-18 ENCOUNTER — Telehealth: Payer: Self-pay | Admitting: *Deleted

## 2014-06-18 NOTE — Telephone Encounter (Signed)
Message     Push CT chest follow up out 4 weeks (set up in January )     Sometime beginning of Feb with follow up right after with Dr. Vassie LollAlva To review         Tammy Parrett NP-C     [facility]     [phone]     ----- Message -----     From: Oretha Milchakesh Alva V, MD     Sent: 06/11/2014 12:51 PM      To: Julio Sicksammy S Parrett, NP        If clinically improved, would wait at least 8 -12 wks from original CT before repeating        RA    ----- Message -----     From: Julio Sicksammy S Parrett, NP     Sent: 06/11/2014  9:27 AM      To: Oretha Milchakesh Alva V, MD    Called spoke with patient to discuss - pt okay with pushing out CT Ad[facility]  [facility]

## 2014-06-19 NOTE — Telephone Encounter (Signed)
[facility] ct closed will call pt next week to change appt Clinton [name]

## 2014-06-23 NOTE — Telephone Encounter (Signed)
Attempted to contact patient on cell number and was unable to leave message due to pt's mailbox not being set up. Will attempt to return call today as available. Rhonda J Cobb

## 2014-06-24 NOTE — Telephone Encounter (Signed)
Called and spoke with patient and patient stated that if possible he would like to have CT Chest on the same day as appointment with Dr. Vassie LollAlva. R/S CT Chest [facility]. Advised patient of date, time and location.  Also mailed appointments to him of CT Chest, PFT and appointment to see Dr. Vassie LollAlva. Pt will have same insurance in 2016. Rhonda J Cobb

## 2014-06-24 NOTE — Telephone Encounter (Signed)
Pt's original CT Chest was 05/22/14. CT Chest, PFT and ROV with Dr. Vassie LollAlva have been r/s to 07/22/14, which is 10 weeks out from original CT Chest as per Dr. Reginia NaasAlva's recommendations. Pt is aware of these appointments and appointments have been mailed to patient. Will forward to Rubye Oaksammy Parrett, NP as FYI. Rhonda J Cobb

## 2014-06-30 ENCOUNTER — Ambulatory Visit (INDEPENDENT_AMBULATORY_CARE_PROVIDER_SITE_OTHER): Payer: Medicare HMO | Admitting: *Deleted

## 2014-06-30 ENCOUNTER — Inpatient Hospital Stay: Admission: RE | Admit: 2014-06-30 | Payer: Medicare HMO | Source: Ambulatory Visit

## 2014-06-30 DIAGNOSIS — I639 Cerebral infarction, unspecified: Secondary | ICD-10-CM

## 2014-06-30 DIAGNOSIS — I6350 Cerebral infarction due to unspecified occlusion or stenosis of unspecified cerebral artery: Secondary | ICD-10-CM

## 2014-06-30 DIAGNOSIS — Z5181 Encounter for therapeutic drug level monitoring: Secondary | ICD-10-CM

## 2014-06-30 DIAGNOSIS — I340 Nonrheumatic mitral (valve) insufficiency: Secondary | ICD-10-CM

## 2014-06-30 DIAGNOSIS — Z7901 Long term (current) use of anticoagulants: Secondary | ICD-10-CM

## 2014-06-30 LAB — POCT INR: INR: 3.5

## 2014-07-01 ENCOUNTER — Ambulatory Visit: Payer: Medicare HMO | Admitting: Physician Assistant

## 2014-07-06 NOTE — Progress Notes (Signed)
HPI: FU coronary disease status post coronary bypassing graft, peripheral vascular disease, cerebrovascular disease, hypertension, hyperlipidemia and CHF. Peripheral vascular disease is followed by vascular surgery. His last myovue was performed in July of 2011. This revealed previous inferior and lateral wall infarct with minimal ischemia in the mid lateral wall and base of the anterior wall. The LV is dilated with EF 29%. Seen by Dr Johney Frame in Feb 2012 for ICD but patient declined. Admitted in December of 2014 with congestive heart failure. Improved with diuresis. TSH noted to be 50 and patient started on Synthroid. Echocardiogram in December 2014 showed an ejection fraction of 20-25%, moderate aortic stenosis, biatrial enlargement, mild to moderate tricuspid regurgitation and mild mitral regurgitation. Recent CT showed pulmonary nodules and pt is being seen by pulmonary. Since he was last seen, he notes dyspnea on exertion. Mild orthopnea but no PND or pedal edema. No chest pain or syncope.  Current Outpatient Prescriptions  Medication Sig Dispense Refill  . carvedilol (COREG) 12.5 MG tablet Take 1.5 tablets (18.75 mg total) by mouth 2 (two) times daily. 90 tablet 12  . dextromethorphan-guaiFENesin (MUCINEX DM) 30-600 MG per 12 hr tablet Take 1 tablet by mouth 2 (two) times daily.    Marland Kitchen ezetimibe (ZETIA) 10 MG tablet Take 10 mg by mouth daily.      . feeding supplement, ENSURE COMPLETE, (ENSURE COMPLETE) LIQD Take 237 mLs by mouth 2 (two) times daily between meals.    . furosemide (LASIX) 40 MG tablet Take 1 tablet (40 mg total) by mouth daily. 30 tablet 6  . hydrALAZINE (APRESOLINE) 50 MG tablet Take 1 tablet (50 mg total) by mouth 3 (three) times daily. 90 tablet 2  . isosorbide mononitrate (IMDUR) 30 MG 24 hr tablet Take 1 tablet (30 mg total) by mouth daily. 30 tablet 6  . levothyroxine (SYNTHROID, LEVOTHROID) 50 MCG tablet Take 1 tablet (50 mcg total) by mouth daily before breakfast. 30  tablet 3  . potassium chloride (K-DUR) 10 MEQ tablet Take 1 tablet (10 mEq total) by mouth daily. 30 tablet 6  . rosuvastatin (CRESTOR) 40 MG tablet Take 1 tablet (40 mg total) by mouth daily. 30 tablet 6  . warfarin (COUMADIN) 6 MG tablet Take as directed per coumadin clinic.  0   No current facility-administered medications for this visit.     Past Medical History  Diagnosis Date  . Peripheral arterial disease     a. Ao-bifem bypass w/ 16x8 hemashield dacron graft, R aortorenal bypass w/ 6mm dacron graft;  b. 09/2011 ABI: R 0/87, L 0.92.  . Carotid artery occlusion     a. 12/2002 R CEA;  b. 05/2007 L Carotid stenting;  c. 09/2008 repeat L carotid stenting 2/2 ISR;  d. 09/2011 Carotid U/S: RICA 40-59%, LICA 60-79%, >50 dist LCCA stenosis.  Marland Kitchen CAD (coronary artery disease)     a. CABG 1985;  b. 05/2002 Rotablator/PTCA to ostial LCX and RI;  c. 12/2009 MV: inf and lat infarct w/ minimal mid lateral and basilar ant ischemia, EF 29%->Med Rx.  . Cardiomyopathy, ischemic     a. 06/2010 Cardiac MRI: EF 30%, anterolat, post, inf prior subendocardial infarcts;  b. 08/2012 Echo: EF 30-35%, mild LVH mult wma's, Gr 2 DD;  c. 05/2013 Echo: EF 20-25%, diff HK, mod AS, mild MR, mod dil LA, mild to mod TR, PASP .  . Stroke     remote  . Hypertension   . Hyperlipidemia   . Tobacco abuse   .  Anticoagulated on Coumadin   . CKD (chronic kidney disease), stage III   . Chronic systolic CHF (congestive heart failure)     a. 05/2013 EF 20-25%, diff HK.  . Moderate aortic stenosis     a. 05/2013 Echo: at least moderate AS (Valve area 1.07 cm2 - VTI; 1.08cm2 - Vmax).  . Hypothyroidism     a. 05/2013 TSH 50.15 - synthroid 50 mcg daily started.    Past Surgical History  Procedure Laterality Date  . Carotid endarterectomy    . Coronary artery bypass graft    . Pr vein bypass graft,aorto-fem-pop    . Ureteral artery renal bypass      History   Social History  . Marital Status: Divorced    Spouse Name:  N/A    Number of Children: N/A  . Years of Education: N/A   Occupational History  . Not on file.   Social History Main Topics  . Smoking status: Former Smoker -- 0.10 packs/day for 50 years    Types: Cigarettes    Quit date: 06/03/2013  . Smokeless tobacco: Never Used     Comment: pt states he smokes 2 cigs per day       ' i AM CURRENTLY TRYING TO QUIT "   . Alcohol Use: No  . Drug Use: No  . Sexual Activity: Not on file   Other Topics Concern  . Not on file   Social History Narrative   Lives in [place] with his son.  They eat out often and otherwise eat a lot of prepared/processed/microwave-type meals.  He continues to smoke a few cigarettes/day.    ROS: no fevers or chills, productive cough, hemoptysis, dysphasia, odynophagia, melena, hematochezia, dysuria, hematuria, rash, seizure activity, orthopnea, PND, pedal edema, claudication. Remaining systems are negative.  Physical Exam: Well-developed chronically ill appearing in no acute distress.  Skin is warm and dry.  HEENT is normal.  Neck is supple.  Chest is clear to auscultation with normal expansion.  Cardiovascular exam is regular rate and rhythm. 3/6 systolic murmur LSB; S2 preserved Abdominal exam nontender or distended. No masses palpated. Extremities show no edema. neuro grossly intact  ECG 05/12/2014-sinus rhythm, occasional PVC, junctional escape beat, left bundle branch block.

## 2014-07-07 ENCOUNTER — Encounter: Payer: Self-pay | Admitting: *Deleted

## 2014-07-07 ENCOUNTER — Encounter: Payer: Self-pay | Admitting: Cardiology

## 2014-07-07 ENCOUNTER — Ambulatory Visit (INDEPENDENT_AMBULATORY_CARE_PROVIDER_SITE_OTHER): Payer: Medicare HMO | Admitting: Cardiology

## 2014-07-07 VITALS — BP 90/60 | HR 82 | Ht 71.0 in | Wt 152.6 lb

## 2014-07-07 DIAGNOSIS — I359 Nonrheumatic aortic valve disorder, unspecified: Secondary | ICD-10-CM

## 2014-07-07 DIAGNOSIS — I10 Essential (primary) hypertension: Secondary | ICD-10-CM

## 2014-07-07 DIAGNOSIS — R911 Solitary pulmonary nodule: Secondary | ICD-10-CM

## 2014-07-07 DIAGNOSIS — I5023 Acute on chronic systolic (congestive) heart failure: Secondary | ICD-10-CM

## 2014-07-07 DIAGNOSIS — F17200 Nicotine dependence, unspecified, uncomplicated: Secondary | ICD-10-CM

## 2014-07-07 DIAGNOSIS — I350 Nonrheumatic aortic (valve) stenosis: Secondary | ICD-10-CM

## 2014-07-07 DIAGNOSIS — E785 Hyperlipidemia, unspecified: Secondary | ICD-10-CM

## 2014-07-07 DIAGNOSIS — E038 Other specified hypothyroidism: Secondary | ICD-10-CM

## 2014-07-07 DIAGNOSIS — Z720 Tobacco use: Secondary | ICD-10-CM

## 2014-07-07 LAB — TSH: TSH: 21.341 u[IU]/mL — ABNORMAL HIGH (ref 0.350–4.500)

## 2014-07-07 NOTE — Assessment & Plan Note (Signed)
Patient has discontinued. 

## 2014-07-07 NOTE — Assessment & Plan Note (Signed)
Continue statin. Not on aspirin given need for Coumadin. 

## 2014-07-07 NOTE — Assessment & Plan Note (Signed)
Patient to follow-up with pulmonary. He has significant dyspnea on exertion most likely multifactorial with a combination of ingested heart failure and COPD. He is euvolemic on examination.

## 2014-07-07 NOTE — Assessment & Plan Note (Signed)
Patient has known cerebrovascular and peripheral vascular disease as well as renal artery stenosis. He has not seen vascular surgery in quite some time. We will arrange follow-up.

## 2014-07-07 NOTE — Assessment & Plan Note (Signed)
Continue statin. 

## 2014-07-07 NOTE — Assessment & Plan Note (Signed)
Continue beta blocker, hydralazine/nitrates. Not on an ACE inhibitor because of renal insufficiency.

## 2014-07-07 NOTE — Assessment & Plan Note (Signed)
I have asked him to follow-up with primary care for this issue. Check TSH.

## 2014-07-07 NOTE — Patient Instructions (Signed)
Your physician wants you to follow-up in: 3 MONTHS WITH DR Jens SomRENSHAW You will receive a reminder letter in the mail two months in advance. If you don't receive a letter, please call our office to schedule the follow-up appointment.   Your physician has requested that you have an echocardiogram. Echocardiography is a painless test that uses sound waves to create images of your heart. It provides your doctor with information about the size and shape of your heart and how well your heart's chambers and valves are working. This procedure takes approximately one hour. There are no restrictions for this procedure.   Your physician recommends that you HAVE LAB WORK TODAY  FOLLOW UP WITH [facility] FOR PAD  FOLLOW UP WITH DR MANNING FOR THYROID ISSUES

## 2014-07-07 NOTE — Assessment & Plan Note (Signed)
Blood pressure controlled. Continue present medications. 

## 2014-07-07 NOTE — Assessment & Plan Note (Signed)
Patient appears to be euvolemic today. Continue present dose of Lasix. I discussed weighing daily with an additional 40 mg of Lasix as needed for weight gain of 2-3 pounds. I also discussed low-sodium diet.

## 2014-07-07 NOTE — Assessment & Plan Note (Signed)
Plan repeat echocardiogram. 

## 2014-07-10 ENCOUNTER — Ambulatory Visit (HOSPITAL_COMMUNITY)
Admission: RE | Admit: 2014-07-10 | Discharge: 2014-07-10 | Disposition: A | Discharge status: Home / Self Care | Payer: Medicare HMO | Source: Ambulatory Visit | Attending: Cardiology | Admitting: Cardiology

## 2014-07-10 DIAGNOSIS — I10 Essential (primary) hypertension: Secondary | ICD-10-CM | POA: Diagnosis not present

## 2014-07-10 DIAGNOSIS — I359 Nonrheumatic aortic valve disorder, unspecified: Secondary | ICD-10-CM | POA: Insufficient documentation

## 2014-07-10 DIAGNOSIS — Z720 Tobacco use: Secondary | ICD-10-CM | POA: Insufficient documentation

## 2014-07-10 NOTE — Progress Notes (Signed)
2D Echo Performed 07/10/2014    Sharrod Achille, RCS  

## 2014-07-11 ENCOUNTER — Other Ambulatory Visit: Payer: Self-pay | Admitting: *Deleted

## 2014-07-11 MED ORDER — LEVOTHYROXINE SODIUM 75 MCG PO TABS: 75.0000 ug | ORAL_TABLET | Freq: Every day | ORAL | Status: AC

## 2014-07-14 ENCOUNTER — Ambulatory Visit (INDEPENDENT_AMBULATORY_CARE_PROVIDER_SITE_OTHER): Payer: Medicare HMO | Admitting: Pharmacist

## 2014-07-14 DIAGNOSIS — I6350 Cerebral infarction due to unspecified occlusion or stenosis of unspecified cerebral artery: Secondary | ICD-10-CM

## 2014-07-14 DIAGNOSIS — I340 Nonrheumatic mitral (valve) insufficiency: Secondary | ICD-10-CM

## 2014-07-14 DIAGNOSIS — Z7901 Long term (current) use of anticoagulants: Secondary | ICD-10-CM

## 2014-07-14 DIAGNOSIS — Z5181 Encounter for therapeutic drug level monitoring: Secondary | ICD-10-CM

## 2014-07-14 DIAGNOSIS — I639 Cerebral infarction, unspecified: Secondary | ICD-10-CM

## 2014-07-14 LAB — POCT INR: INR: 5.4

## 2014-07-21 ENCOUNTER — Other Ambulatory Visit: Payer: Self-pay | Admitting: Pulmonary Disease

## 2014-07-21 DIAGNOSIS — R0600 Dyspnea, unspecified: Secondary | ICD-10-CM

## 2014-07-22 ENCOUNTER — Ambulatory Visit (INDEPENDENT_AMBULATORY_CARE_PROVIDER_SITE_OTHER)
Admission: RE | Admit: 2014-07-22 | Discharge: 2014-07-22 | Disposition: A | Discharge status: Home / Self Care | Payer: Medicare HMO | Source: Ambulatory Visit | Attending: Adult Health | Admitting: Adult Health

## 2014-07-22 ENCOUNTER — Ambulatory Visit: Payer: Medicare HMO | Admitting: Pulmonary Disease

## 2014-07-22 DIAGNOSIS — R911 Solitary pulmonary nodule: Secondary | ICD-10-CM

## 2014-07-22 DIAGNOSIS — J90 Pleural effusion, not elsewhere classified: Secondary | ICD-10-CM

## 2014-07-28 ENCOUNTER — Ambulatory Visit (INDEPENDENT_AMBULATORY_CARE_PROVIDER_SITE_OTHER): Payer: Medicare HMO | Admitting: *Deleted

## 2014-07-28 DIAGNOSIS — I340 Nonrheumatic mitral (valve) insufficiency: Secondary | ICD-10-CM

## 2014-07-28 DIAGNOSIS — Z5181 Encounter for therapeutic drug level monitoring: Secondary | ICD-10-CM

## 2014-07-28 DIAGNOSIS — I6350 Cerebral infarction due to unspecified occlusion or stenosis of unspecified cerebral artery: Secondary | ICD-10-CM

## 2014-07-28 DIAGNOSIS — Z7901 Long term (current) use of anticoagulants: Secondary | ICD-10-CM

## 2014-07-28 DIAGNOSIS — I639 Cerebral infarction, unspecified: Secondary | ICD-10-CM

## 2014-07-28 LAB — POCT INR: INR: 3.2

## 2014-07-30 ENCOUNTER — Encounter: Payer: Self-pay | Admitting: Pulmonary Disease

## 2014-07-30 ENCOUNTER — Ambulatory Visit (INDEPENDENT_AMBULATORY_CARE_PROVIDER_SITE_OTHER): Payer: Medicare HMO | Admitting: Pulmonary Disease

## 2014-07-30 VITALS — BP 90/60 | HR 80 | Ht 69.0 in | Wt 155.4 lb

## 2014-07-30 DIAGNOSIS — J90 Pleural effusion, not elsewhere classified: Secondary | ICD-10-CM

## 2014-07-30 DIAGNOSIS — J189 Pneumonia, unspecified organism: Secondary | ICD-10-CM

## 2014-07-30 DIAGNOSIS — J432 Centrilobular emphysema: Secondary | ICD-10-CM

## 2014-07-30 DIAGNOSIS — R911 Solitary pulmonary nodule: Secondary | ICD-10-CM

## 2014-07-30 DIAGNOSIS — R0600 Dyspnea, unspecified: Secondary | ICD-10-CM

## 2014-07-30 NOTE — Patient Instructions (Signed)
Pneumonia is slowly resolving Nodules in your lung are stable Stay on lasix Trial of spiriva daily - sample -call for Rx if this works Repeat CT scan in 6months

## 2014-07-30 NOTE — Progress Notes (Signed)
   Subjective:    Patient ID: Clinton [name], male    DOB: 05/27/1941, 74 y.o.   MRN: [id number]  HPI [age] yo male former smoker with Ischemic CM , Chronic systolic failure with EF 20-25% seen for pulmonary consult during hospitalization for RLL CAP (NOS) , probable right parapneumonic pleural effuison , hemoptysis and lung nodules   Patient was seen for a pulmonary consult during hospitalization 05/2014 for a right lower lobe pneumonia with a probable right parapneumonic pleural effusion.He has a known history of ischemic cardiomyopathy and congestive heart failure and is on chronic Coumadin therapy On admission his INR was was greater than 8.  Patient smoked for approximately 45 years . He quit 1 month prior to admission.smoked 1 PPD . 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  No previous PFT . No formal dx of COPD.   07/30/2014  Chief Complaint  Patient presents with  . Follow-up    recheck pneumonia; short winded, no strength, fatigue, wheezing   He feels 50% better since hosp admission 'cannot sleep' No more hemoptysis   Significant tests/ events  CT chest 05/22/14 - right lower lobe consolidation with a associated moderate effusion. Patient had 3 pulmonary nodules on the left with the largest measuring 16 mm. He had a mildly enlarged right mediastinal lymph node Ct chest 07/22/14 >> improved aeration RLL, BL effusions  R>>L, unchanged LLL nodules  Spirometry >>severe obs - FEV1 1.21-37%, FVC 51%  Labs TSH 21 INR 3.2 on 2/1, as 5.4 on 1/18 BUN/ cr 33/2.1  Review of Systems  neg for any significant sore throat, dysphagia, itching, sneezing, nasal congestion or excess/ purulent secretions, fever, chills, sweats, unintended wt loss, pleuritic or exertional cp, hempoptysis, orthopnea pnd or change in chronic leg swelling. Also denies presyncope, palpitations, heartburn, abdominal pain, nausea, vomiting, diarrhea or change in bowel or urinary habits,  dysuria,hematuria, rash, arthralgias, visual complaints, headache, numbness weakness or ataxia.     Objective:   Physical Exam  Gen. Pleasant, well-nourished, in no distress ENT - no lesions, no post nasal drip Neck: No JVD, no thyromegaly, no carotid bruits Lungs: no use of accessory muscles, no dullness to percussion, crackles rt base - without rales or rhonchi  Cardiovascular: Rhythm regular, heart sounds  normal, no murmurs or gallops, no peripheral edema Musculoskeletal: No deformities, no cyanosis or clubbing        Assessment & Plan:

## 2014-08-01 DIAGNOSIS — J90 Pleural effusion, not elsewhere classified: Secondary | ICD-10-CM | POA: Insufficient documentation

## 2014-08-01 DIAGNOSIS — J449 Chronic obstructive pulmonary disease, unspecified: Secondary | ICD-10-CM | POA: Insufficient documentation

## 2014-08-01 NOTE — Assessment & Plan Note (Signed)
Symptomatically improved, slowly resolving on CT scan Will need follow-up imaging to resolution-chest x-ray on next visit

## 2014-08-01 NOTE — Assessment & Plan Note (Signed)
Stay on lasix

## 2014-08-01 NOTE — Assessment & Plan Note (Signed)
Trial of spiriva daily - sample -call for Rx if this works

## 2014-08-01 NOTE — Assessment & Plan Note (Signed)
  Nodules in your lung are stable Repeat CT scan in 6months

## 2014-08-14 ENCOUNTER — Ambulatory Visit (INDEPENDENT_AMBULATORY_CARE_PROVIDER_SITE_OTHER): Payer: Medicare HMO | Admitting: *Deleted

## 2014-08-14 DIAGNOSIS — I6350 Cerebral infarction due to unspecified occlusion or stenosis of unspecified cerebral artery: Secondary | ICD-10-CM

## 2014-08-14 DIAGNOSIS — Z7901 Long term (current) use of anticoagulants: Secondary | ICD-10-CM

## 2014-08-14 DIAGNOSIS — I340 Nonrheumatic mitral (valve) insufficiency: Secondary | ICD-10-CM

## 2014-08-14 DIAGNOSIS — I639 Cerebral infarction, unspecified: Secondary | ICD-10-CM

## 2014-08-14 DIAGNOSIS — Z5181 Encounter for therapeutic drug level monitoring: Secondary | ICD-10-CM

## 2014-08-14 LAB — POCT INR: INR: 2.6

## 2014-09-03 ENCOUNTER — Ambulatory Visit: Payer: Medicare HMO | Admitting: Pulmonary Disease

## 2014-09-04 ENCOUNTER — Ambulatory Visit (INDEPENDENT_AMBULATORY_CARE_PROVIDER_SITE_OTHER): Payer: Medicare HMO | Admitting: *Deleted

## 2014-09-04 DIAGNOSIS — Z5181 Encounter for therapeutic drug level monitoring: Secondary | ICD-10-CM

## 2014-09-04 DIAGNOSIS — I6350 Cerebral infarction due to unspecified occlusion or stenosis of unspecified cerebral artery: Secondary | ICD-10-CM

## 2014-09-04 DIAGNOSIS — Z7901 Long term (current) use of anticoagulants: Secondary | ICD-10-CM

## 2014-09-04 DIAGNOSIS — I639 Cerebral infarction, unspecified: Secondary | ICD-10-CM

## 2014-09-04 DIAGNOSIS — I340 Nonrheumatic mitral (valve) insufficiency: Secondary | ICD-10-CM

## 2014-09-04 LAB — POCT INR: INR: 3.6

## 2014-09-12 ENCOUNTER — Other Ambulatory Visit: Payer: Self-pay | Admitting: Cardiology

## 2014-09-18 ENCOUNTER — Ambulatory Visit (INDEPENDENT_AMBULATORY_CARE_PROVIDER_SITE_OTHER): Payer: Medicare HMO | Admitting: *Deleted

## 2014-09-18 DIAGNOSIS — Z5181 Encounter for therapeutic drug level monitoring: Secondary | ICD-10-CM

## 2014-09-18 DIAGNOSIS — Z7901 Long term (current) use of anticoagulants: Secondary | ICD-10-CM

## 2014-09-18 DIAGNOSIS — I639 Cerebral infarction, unspecified: Secondary | ICD-10-CM

## 2014-09-18 DIAGNOSIS — I6350 Cerebral infarction due to unspecified occlusion or stenosis of unspecified cerebral artery: Secondary | ICD-10-CM

## 2014-09-18 DIAGNOSIS — I340 Nonrheumatic mitral (valve) insufficiency: Secondary | ICD-10-CM | POA: Diagnosis not present

## 2014-09-18 LAB — POCT INR: INR: 3.4

## 2014-10-02 ENCOUNTER — Ambulatory Visit (INDEPENDENT_AMBULATORY_CARE_PROVIDER_SITE_OTHER): Payer: Medicare HMO | Admitting: *Deleted

## 2014-10-02 DIAGNOSIS — Z5181 Encounter for therapeutic drug level monitoring: Secondary | ICD-10-CM

## 2014-10-02 DIAGNOSIS — Z7901 Long term (current) use of anticoagulants: Secondary | ICD-10-CM

## 2014-10-02 DIAGNOSIS — I340 Nonrheumatic mitral (valve) insufficiency: Secondary | ICD-10-CM | POA: Diagnosis not present

## 2014-10-02 DIAGNOSIS — I639 Cerebral infarction, unspecified: Secondary | ICD-10-CM

## 2014-10-02 DIAGNOSIS — I6350 Cerebral infarction due to unspecified occlusion or stenosis of unspecified cerebral artery: Secondary | ICD-10-CM

## 2014-10-02 LAB — POCT INR: INR: 3.1

## 2014-10-17 ENCOUNTER — Ambulatory Visit (INDEPENDENT_AMBULATORY_CARE_PROVIDER_SITE_OTHER): Payer: Medicare HMO | Admitting: *Deleted

## 2014-10-17 DIAGNOSIS — I340 Nonrheumatic mitral (valve) insufficiency: Secondary | ICD-10-CM | POA: Diagnosis not present

## 2014-10-17 DIAGNOSIS — I6350 Cerebral infarction due to unspecified occlusion or stenosis of unspecified cerebral artery: Secondary | ICD-10-CM

## 2014-10-17 DIAGNOSIS — Z5181 Encounter for therapeutic drug level monitoring: Secondary | ICD-10-CM

## 2014-10-17 DIAGNOSIS — I639 Cerebral infarction, unspecified: Secondary | ICD-10-CM | POA: Diagnosis not present

## 2014-10-17 DIAGNOSIS — Z7901 Long term (current) use of anticoagulants: Secondary | ICD-10-CM | POA: Diagnosis not present

## 2014-10-17 LAB — POCT INR: INR: 2.3

## 2014-10-22 ENCOUNTER — Other Ambulatory Visit: Payer: Self-pay | Admitting: Cardiology

## 2014-11-03 ENCOUNTER — Other Ambulatory Visit: Payer: Self-pay | Admitting: Cardiology

## 2014-11-25 ENCOUNTER — Other Ambulatory Visit: Payer: Self-pay | Admitting: Cardiology

## 2014-11-27 ENCOUNTER — Ambulatory Visit (INDEPENDENT_AMBULATORY_CARE_PROVIDER_SITE_OTHER): Payer: Medicare HMO

## 2014-11-27 DIAGNOSIS — I340 Nonrheumatic mitral (valve) insufficiency: Secondary | ICD-10-CM

## 2014-11-27 DIAGNOSIS — Z7901 Long term (current) use of anticoagulants: Secondary | ICD-10-CM

## 2014-11-27 DIAGNOSIS — I639 Cerebral infarction, unspecified: Secondary | ICD-10-CM | POA: Diagnosis not present

## 2014-11-27 DIAGNOSIS — Z5181 Encounter for therapeutic drug level monitoring: Secondary | ICD-10-CM | POA: Diagnosis not present

## 2014-11-27 DIAGNOSIS — I6350 Cerebral infarction due to unspecified occlusion or stenosis of unspecified cerebral artery: Secondary | ICD-10-CM

## 2014-11-27 LAB — POCT INR: INR: 4.1

## 2014-11-27 NOTE — Telephone Encounter (Signed)
Per note 4.28.16

## 2014-12-02 ENCOUNTER — Ambulatory Visit (INDEPENDENT_AMBULATORY_CARE_PROVIDER_SITE_OTHER)
Admission: RE | Admit: 2014-12-02 | Discharge: 2014-12-02 | Disposition: A | Discharge status: Home / Self Care | Payer: Medicare HMO | Source: Ambulatory Visit | Attending: Adult Health | Admitting: Adult Health

## 2014-12-02 ENCOUNTER — Encounter: Payer: Self-pay | Admitting: Adult Health

## 2014-12-02 ENCOUNTER — Ambulatory Visit (INDEPENDENT_AMBULATORY_CARE_PROVIDER_SITE_OTHER): Payer: Medicare HMO | Admitting: Adult Health

## 2014-12-02 VITALS — BP 104/62 | HR 71 | Temp 97.4°F | Ht 71.0 in | Wt 177.3 lb

## 2014-12-02 DIAGNOSIS — I5023 Acute on chronic systolic (congestive) heart failure: Secondary | ICD-10-CM

## 2014-12-02 DIAGNOSIS — J449 Chronic obstructive pulmonary disease, unspecified: Secondary | ICD-10-CM | POA: Diagnosis not present

## 2014-12-02 DIAGNOSIS — J189 Pneumonia, unspecified organism: Secondary | ICD-10-CM | POA: Diagnosis not present

## 2014-12-02 DIAGNOSIS — R911 Solitary pulmonary nodule: Secondary | ICD-10-CM | POA: Diagnosis not present

## 2014-12-02 DIAGNOSIS — E038 Other specified hypothyroidism: Secondary | ICD-10-CM

## 2014-12-02 NOTE — Assessment & Plan Note (Signed)
Left lower lobe lung nodules . Stable  on last CT and January Repeat CT chest next month

## 2014-12-02 NOTE — Addendum Note (Signed)
Addended by: Boone MasterJONES, JESSICA E on: 12/02/2014 02:21 PM   Modules accepted: Orders

## 2014-12-02 NOTE — Progress Notes (Signed)
Cardiology Office Note   Date:  12/03/2014   ID:  Clinton [name], DOB 1940-12-22, MRN [id number]  PCP:  No PCP Per Patient  Cardiologist:  Clinton [name]     Chief Complaint  Patient presents with  . Congestive Heart Failure     History of Present Illness: Clinton [name] is a 74 y.o. male with a hx of CAD s/p CABG, PAD s/p Ao-bifem bypass and Ao-R renal bypass, carotid stenosis s/p bilateral CEA, LICA and L CCA stenting, HTN, HL, ICM, systolic HF, aortic stenosis, hypothyroidism, prior CVA, CKD.  [facility] is followed by [facility].   He was seen by Clinton [name] in 2012 and declined ICD implantation.  Admitted in 12/14 for a/c systolic HF.  Recent CT showed pulmonary nodules and is followed by pulmonary.  Last seen by Clinton [name] 1/16.    Over the last few months, he has noted increasing LE edema, orthopnea and fatigue.  He is chronically short of breath.  He is NYHA 3.  He sleeps on 2-3 pillows.  He denies PND.  He denies syncope.  He denies chest pain.  He saw Pulmonology yesterday.  He was asked to increase his lasix.  He has not noticed much benefit.  He had a CXR yesterday that demonstrates CHF and bilateral effusions.     Studies/Reports Reviewed Today:  CXR 12/02/14 IMPRESSION: 1. Prior CABG. 2. Congestive heart failure with pulmonary interstitial edema and bilateral pleural effusions.  Echo 07/10/14 Mod LVH, EF 30-35%, inf HK, inf-lat HK, Gr 2 DD Mod AS, mean 14 mmHg, peak 25 mmHg Mild to mod MR Severe LAE Mild RVE, mild reduced RVSF Mild RAE Mod TR PASP 58 mmHg L effusion  Renal Artery [place] 12/13 R renal artery bypass patent L renal artery origin severe stenosis  Carotid US 4/13 R 40-59%; L 60-79%  Myoview 7/11 Inferior and lateral infarct with minimal ischemia in mid lateral wall and base of anterior wall, EF 29%  Past Medical History  Diagnosis Date  . Peripheral arterial disease     a. Ao-bifem bypass w/ 16x8 hemashield dacron graft, R aortorenal  bypass w/ 6mm dacron graft;  b. 09/2011 ABI: R 0/87, L 0.92.  . Carotid artery occlusion     a. 12/2002 R CEA;  b. 05/2007 L Carotid stenting;  c. 09/2008 repeat L carotid stenting 2/2 ISR;  d. 09/2011 Carotid U/S: RICA 40-59%, LICA 60-79%, >50 dist LCCA stenosis.  Marland Kitchen CAD (coronary artery disease)     a. CABG 1985;  b. 05/2002 Rotablator/PTCA to ostial LCX and RI;  c. 12/2009 MV: inf and lat infarct w/ minimal mid lateral and basilar ant ischemia, EF 29%->Med Rx.  . Cardiomyopathy, ischemic     a. 06/2010 Cardiac MRI: EF 30%, anterolat, post, inf prior subendocardial infarcts;  b. 08/2012 Echo: EF 30-35%, mild LVH mult wma's, Gr 2 DD;  c. 05/2013 Echo: EF 20-25%, diff HK, mod AS, mild MR, mod dil LA, mild to mod TR, PASP .  . Stroke     remote  . Hypertension   . Hyperlipidemia   . Tobacco abuse   . Anticoagulated on Coumadin   . CKD (chronic kidney disease), stage III   . Chronic systolic CHF (congestive heart failure)     a. 05/2013 EF 20-25%, diff HK.  . Moderate aortic stenosis     a. 05/2013 Echo: at least moderate AS (Valve area 1.07 cm2 - VTI; 1.08cm2 - Vmax).  . Hypothyroidism  a. 05/2013 TSH 50.15 - synthroid 50 mcg daily started.    Past Surgical History  Procedure Laterality Date  . Carotid endarterectomy    . Coronary artery bypass graft    . Pr vein bypass graft,aorto-fem-pop    . Ureteral artery renal bypass       Current Outpatient Prescriptions  Medication Sig Dispense Refill  . carvedilol (COREG) 12.5 MG tablet take 1 and 1/2 tablets by mouth twice a day 90 tablet 0  . dextromethorphan-guaiFENesin (MUCINEX DM) 30-600 MG per 12 hr tablet Take 1 tablet by mouth 2 (two) times daily.    Marland Kitchen. ezetimibe (ZETIA) 10 MG tablet Take 10 mg by mouth daily.      . feeding supplement, ENSURE COMPLETE, (ENSURE COMPLETE) LIQD Take 237 mLs by mouth 2 (two) times daily between meals.    . furosemide (LASIX) 40 MG tablet Take 1 tablet (40 mg total) by mouth daily. 30 tablet 6  .  hydrALAZINE (APRESOLINE) 50 MG tablet Take 1 tablet (50 mg total) by mouth 3 (three) times daily. 270 tablet 0  . isosorbide mononitrate (IMDUR) 30 MG 24 hr tablet Take 1 tablet (30 mg total) by mouth daily. 30 tablet 6  . levothyroxine (SYNTHROID, LEVOTHROID) 75 MCG tablet Take 1 tablet (75 mcg total) by mouth daily before breakfast. 30 tablet 12  . potassium chloride (K-DUR) 10 MEQ tablet Take 1 tablet (10 mEq total) by mouth daily. 30 tablet 6  . rosuvastatin (CRESTOR) 40 MG tablet Take 1 tablet (40 mg total) by mouth daily. 30 tablet 6  . warfarin (COUMADIN) 6 MG tablet take as directed BY COUMADIN CLINIC 35 tablet 3   No current facility-administered medications for this visit.    Allergies:   Codeine phosphate    Social History:  The patient  reports that he quit smoking about 18 months ago. His smoking use included Cigarettes. He has a 5 pack-year smoking history. He has never used smokeless tobacco. He reports that he does not drink alcohol or use illicit drugs.   Family History:  The patient's family history includes CAD in an other family member; Stroke in his mother. There is no history of Heart attack.    ROS:   Please see the history of present illness.   Review of Systems  Constitution: Positive for malaise/fatigue.  Cardiovascular: Positive for dyspnea on exertion, leg swelling, orthopnea and paroxysmal nocturnal dyspnea.  Respiratory: Positive for wheezing.   Hematologic/Lymphatic: Bruises/bleeds easily.  Musculoskeletal: Positive for joint pain.  All other systems reviewed and are negative.     PHYSICAL EXAM: VS:  BP 132/84 mmHg  Pulse 74  Ht 5' 11.5" (1.816 m)  Wt 178 lb (80.74 kg)  BMI 24.48 kg/m2    Wt Readings from Last 3 Encounters:  12/03/14 178 lb (80.74 kg)  12/02/14 177 lb 4.8 oz (80.423 kg)  07/30/14 155 lb 6.4 oz (70.489 kg)     GEN: Well nourished, well developed, in no acute distress HEENT: normal Neck: + JVD,  no masses Cardiac:  Normal  S1/S2, RRR; 2/6 systolic murmur RUSB,  no rubs or gallops, tight 2-3+ bilateral LE edema up to thighs, + presacral edema Respiratory:  Decreased breath sounds bilaterally, no wheezing GI:  distended, non-tender MS: no deformity or atrophy Skin: warm and dry  Neuro:  CNs II-XII intact, Strength and sensation are intact Psych: Normal affect   EKG:  EKG is ordered today.  It demonstrates:   Sinus brady, HR ~ 55, LBBB, PVCs  Recent Labs: 05/10/2014: Pro B Natriuretic peptide (BNP) 63888.0* 05/15/2014: Magnesium 2.3 05/23/2014: ALT 32 05/24/2014: Hemoglobin 11.5*; Platelets 145* 06/13/2014: BUN 33*; Creatinine, Ser 2.1*; Potassium 4.3; Sodium 134* 07/07/2014: TSH 21.341*    Lipid Panel    Component Value Date/Time   CHOL 243* 10/31/2013 0819   TRIG 102.0 10/31/2013 0819   HDL 40.90 10/31/2013 0819   CHOLHDL 6 10/31/2013 0819   VLDL 20.4 10/31/2013 0819   LDLCALC 182* 10/31/2013 0819   LDLDIRECT 186.1 09/21/2012 0754      ASSESSMENT AND PLAN:  Acute on chronic systolic CHF (congestive heart failure):  Patient presents with acute on chronic systolic CHF. He is +23 pounds since February. He notes fatigue and probably has some low output symptoms. He also has moderate aortic stenosis by echocardiogram in January. This is probably underestimated given his low EF. His aortic stenosis is likely contributing to his heart failure as well. He does admit to significant dietary indiscretion with salt. Chest x-ray yesterday demonstrates bilateral pleural effusions and evidence of CHF. The patient requires admission to the hospital for further evaluation and management. This has been discussed with the patient and he agrees to proceed.  -  Admit to Tele at [facility]  -  Start with Lasix 60 mg IV BID.  This may need to be increased given CKD.  -  Repeat Echo  -  Consider HF Team consult  -  Consider EP consult.  May benefit from considering CRT (1st degree AVB, LBBB).  Has declined ICD in  past.  Ischemic cardiomyopathy;  Continue beta blocker, hydralazine, nitrates. He is not on ACE inhibitor due to advanced chronic kidney disease. Repeat Echo.  Coronary artery disease involving native coronary artery of native heart without angina pectoris:  No angina.  Obtain serial CEs.  Continue beta-blocker, nitrates, statin. He is not on ASA as he is on Coumadin.    Moderate aortic stenosis:  Likely underestimated due to low EF.  Repeat Echo. Will have Dr. Tonny Bollman review echo to see if AS is worse than estimated and if consult would be appropriate to consider TAVR.   Cerebral artery occlusion with cerebral infarction:  Followed by [facility]. Continue Coumadin.   Essential hypertension:  Controlled.   Hyperlipidemia:  Continue statin.   Lung nodule:  Followed by Pulmonology.   Renal artery stenosis:  Followed by [facility].  CKD (chronic kidney disease), stage III:  Monitor renal function closely with diuresis.     Current medicines are reviewed at length with the patient today.  Concerns regarding medicines are as outlined above.  The following changes have been made:    As above.    Labs/ tests ordered today include:    No orders of the defined types were placed in this encounter.    Disposition:   Admit to [facility].   Signed, Brynda Rim, [place] 12/03/2014 1:02 PM    [facility] [address] [place], [place], [place]  [address] Phone: [phone]; Fax: [phone]

## 2014-12-02 NOTE — Assessment & Plan Note (Signed)
Severe COPD  PLAN Trial of ANORO  Follow-up in 4 weeks

## 2014-12-02 NOTE — Assessment & Plan Note (Signed)
Appears to have some fluid overload with decompensated systolic and diastolic congestive heart failure With leg swelling and weight gain Advise patient on low salt diet We'll increase Lasix briefly as he has underlying renal insufficiency Need to check labs along with a TSH as he his hypothyroidism has been uncontrolled  Plan  Labs today .  Take extra Lasix 40mg  daily for 2 days then resume Lasix 40mg  daily  Weigh everyday and keep log.  Low salt diet  Keep legs elevated.  Refer to primary care MD to establish.  Refer to cardiology to see for worsening CHF ~1 week.  Begin ANORO 1 puff daily  CT chest w/o contrast for lung nodules.  Follow up Dr. Vassie LollAlva  In 4 weeks and As needed   Please contact office for sooner follow up if symptoms do not improve or worsen or seek emergency care

## 2014-12-02 NOTE — Patient Instructions (Addendum)
Labs today .  Take extra Lasix 40mg  daily for 2 days then resume Lasix 40mg  daily  Weigh everyday and keep log.  Low salt diet  Keep legs elevated.  Refer to primary care MD to establish.  Refer to cardiology to see for worsening CHF ~1 week.  Begin ANORO 1 puff daily  CT chest w/o contrast for lung nodules.  Follow up Dr. Vassie LollAlva  In 4 weeks and As needed   Please contact office for sooner follow up if symptoms do not improve or worsen or seek emergency care

## 2014-12-02 NOTE — Progress Notes (Signed)
   Subjective:    Patient ID: Clinton [name], male    DOB: [birth year], 74 y.o.   MRN: [id number]  HPI 74 yo male former smoker with Ischemic CM , Chronic systolic failure with EF 20-25% seen for pulmonary consult during hospitalization for RLL CAP (NOS) , probable right parapneumonic pleural effuison , hemoptysis and lung nodules   Patient was seen for a pulmonary consult during hospitalization 05/2014 for a right lower lobe pneumonia with a probable right parapneumonic pleural effusion.He has a known history of ischemic cardiomyopathy and congestive heart failure and is on chronic Coumadin therapy On admission his INR was was greater than 8.  Patient smoked for approximately 45 years . He quit 1 month prior to admission.smoked 1 PPD . 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  No previous PFT . No formal dx of COPD.   He feels 50% better since hosp admission 'cannot sleep' No more hemoptysis  12/02/2014 Follow up : COPD /lung nodules /CHF  Patient returns for a four-month follow-up. Patient says that he had previously tried Spiriva from last visit. However did not feel like his inhaler worked. Has shortness of breath with minimal activity.  Patient has known congestive heart failure. Weight is up 20 pounds legs been more swollen. Chest x-ray shows persistent bilateral pleural effusions and diffuse interstitial prominence. Says he gets short of breath easily. Remains on Lasix 40 mg daily. Does admit to using salt daily. Last TSH was high around 21. Agent says he has not followed up with his primary care physician because he moved and needs a new primary care doctor  Last CT chest in January 2016 showed no significant change in left lower lobe nodules. He is due for a CT chest . He denies any hemoptysis, orthopnea, PND or fever.   Significant tests/ events  CT chest 05/22/14 - right lower lobe consolidation with a associated moderate effusion. Patient had 3 pulmonary  nodules on the left with the largest measuring 16 mm. He had a mildly enlarged right mediastinal lymph node Ct chest 07/22/14 >> improved aeration RLL, BL effusions  R>>L, unchanged LLL nodules  Spirometry >>severe obs - FEV1 1.21-37%, FVC 51%    Review of Systems  neg for any significant sore throat, dysphagia, itching, sneezing, nasal congestion or excess/ purulent secretions, fever, chills, sweats, unintended wt loss, pleuritic or exertional cp, hempoptysis, orthopnea pnd or change in chronic leg swelling. Also denies presyncope, palpitations, heartburn, abdominal pain, nausea, vomiting, diarrhea or change in bowel or urinary habits, dysuria,hematuria, rash, arthralgias, visual complaints, headache, numbness weakness or ataxia.     Objective:   Physical Exam  Gen. Pleasant,elderly  in no distress ENT - no lesions, no post nasal drip Neck: No JVD, no thyromegaly, no carotid bruits Lungs: no use of accessory muscles, no dullness to percussion, decreased BS in bases  - without rales or rhonchi  Cardiovascular: Rhythm regular, heart sounds  normal, no murmurs or gallops, 2+ peripheral edema Musculoskeletal: No deformities, no cyanosis or clubbing        Assessment & Plan:

## 2014-12-03 ENCOUNTER — Encounter (HOSPITAL_COMMUNITY): Payer: Self-pay | Admitting: General Practice

## 2014-12-03 ENCOUNTER — Encounter: Payer: Self-pay | Admitting: Physician Assistant

## 2014-12-03 ENCOUNTER — Ambulatory Visit (INDEPENDENT_AMBULATORY_CARE_PROVIDER_SITE_OTHER): Payer: Medicare HMO | Admitting: Physician Assistant

## 2014-12-03 ENCOUNTER — Inpatient Hospital Stay (HOSPITAL_COMMUNITY)
Admission: AD | Admit: 2014-12-03 | Discharge: 2014-12-16 | DRG: 291 | Disposition: A | Discharge status: Home Health | Payer: Medicare HMO | Source: Ambulatory Visit | Attending: Internal Medicine | Admitting: Internal Medicine

## 2014-12-03 ENCOUNTER — Other Ambulatory Visit: Payer: Self-pay | Admitting: Cardiology

## 2014-12-03 VITALS — BP 132/84 | HR 74 | Ht 71.5 in | Wt 178.0 lb

## 2014-12-03 DIAGNOSIS — I359 Nonrheumatic aortic valve disorder, unspecified: Secondary | ICD-10-CM | POA: Diagnosis not present

## 2014-12-03 DIAGNOSIS — I129 Hypertensive chronic kidney disease with stage 1 through stage 4 chronic kidney disease, or unspecified chronic kidney disease: Secondary | ICD-10-CM | POA: Diagnosis present

## 2014-12-03 DIAGNOSIS — Z7189 Other specified counseling: Secondary | ICD-10-CM | POA: Diagnosis not present

## 2014-12-03 DIAGNOSIS — I2510 Atherosclerotic heart disease of native coronary artery without angina pectoris: Secondary | ICD-10-CM | POA: Diagnosis present

## 2014-12-03 DIAGNOSIS — Z823 Family history of stroke: Secondary | ICD-10-CM

## 2014-12-03 DIAGNOSIS — I350 Nonrheumatic aortic (valve) stenosis: Secondary | ICD-10-CM | POA: Diagnosis present

## 2014-12-03 DIAGNOSIS — Z7901 Long term (current) use of anticoagulants: Secondary | ICD-10-CM

## 2014-12-03 DIAGNOSIS — Z87891 Personal history of nicotine dependence: Secondary | ICD-10-CM

## 2014-12-03 DIAGNOSIS — M109 Gout, unspecified: Secondary | ICD-10-CM | POA: Diagnosis present

## 2014-12-03 DIAGNOSIS — R188 Other ascites: Secondary | ICD-10-CM | POA: Diagnosis present

## 2014-12-03 DIAGNOSIS — R911 Solitary pulmonary nodule: Secondary | ICD-10-CM | POA: Diagnosis present

## 2014-12-03 DIAGNOSIS — I701 Atherosclerosis of renal artery: Secondary | ICD-10-CM | POA: Diagnosis present

## 2014-12-03 DIAGNOSIS — Z515 Encounter for palliative care: Secondary | ICD-10-CM | POA: Diagnosis not present

## 2014-12-03 DIAGNOSIS — R531 Weakness: Secondary | ICD-10-CM | POA: Diagnosis not present

## 2014-12-03 DIAGNOSIS — I472 Ventricular tachycardia: Secondary | ICD-10-CM | POA: Diagnosis present

## 2014-12-03 DIAGNOSIS — I272 Other secondary pulmonary hypertension: Secondary | ICD-10-CM | POA: Diagnosis present

## 2014-12-03 DIAGNOSIS — Z79899 Other long term (current) drug therapy: Secondary | ICD-10-CM | POA: Diagnosis not present

## 2014-12-03 DIAGNOSIS — E039 Hypothyroidism, unspecified: Secondary | ICD-10-CM | POA: Diagnosis present

## 2014-12-03 DIAGNOSIS — Z8249 Family history of ischemic heart disease and other diseases of the circulatory system: Secondary | ICD-10-CM

## 2014-12-03 DIAGNOSIS — Z951 Presence of aortocoronary bypass graft: Secondary | ICD-10-CM

## 2014-12-03 DIAGNOSIS — J9620 Acute and chronic respiratory failure, unspecified whether with hypoxia or hypercapnia: Secondary | ICD-10-CM | POA: Diagnosis present

## 2014-12-03 DIAGNOSIS — N183 Chronic kidney disease, stage 3 unspecified: Secondary | ICD-10-CM

## 2014-12-03 DIAGNOSIS — I5043 Acute on chronic combined systolic (congestive) and diastolic (congestive) heart failure: Principal | ICD-10-CM | POA: Diagnosis present

## 2014-12-03 DIAGNOSIS — N184 Chronic kidney disease, stage 4 (severe): Secondary | ICD-10-CM | POA: Diagnosis present

## 2014-12-03 DIAGNOSIS — I5023 Acute on chronic systolic (congestive) heart failure: Secondary | ICD-10-CM | POA: Diagnosis not present

## 2014-12-03 DIAGNOSIS — Z66 Do not resuscitate: Secondary | ICD-10-CM | POA: Diagnosis present

## 2014-12-03 DIAGNOSIS — I5022 Chronic systolic (congestive) heart failure: Secondary | ICD-10-CM | POA: Diagnosis not present

## 2014-12-03 DIAGNOSIS — I252 Old myocardial infarction: Secondary | ICD-10-CM | POA: Diagnosis not present

## 2014-12-03 DIAGNOSIS — E785 Hyperlipidemia, unspecified: Secondary | ICD-10-CM | POA: Diagnosis present

## 2014-12-03 DIAGNOSIS — N179 Acute kidney failure, unspecified: Secondary | ICD-10-CM | POA: Diagnosis not present

## 2014-12-03 DIAGNOSIS — M79606 Pain in leg, unspecified: Secondary | ICD-10-CM | POA: Diagnosis not present

## 2014-12-03 DIAGNOSIS — D509 Iron deficiency anemia, unspecified: Secondary | ICD-10-CM | POA: Diagnosis present

## 2014-12-03 DIAGNOSIS — I10 Essential (primary) hypertension: Secondary | ICD-10-CM | POA: Diagnosis not present

## 2014-12-03 DIAGNOSIS — I255 Ischemic cardiomyopathy: Secondary | ICD-10-CM | POA: Diagnosis present

## 2014-12-03 DIAGNOSIS — R570 Cardiogenic shock: Secondary | ICD-10-CM | POA: Diagnosis present

## 2014-12-03 DIAGNOSIS — I6350 Cerebral infarction due to unspecified occlusion or stenosis of unspecified cerebral artery: Secondary | ICD-10-CM

## 2014-12-03 DIAGNOSIS — I420 Dilated cardiomyopathy: Secondary | ICD-10-CM | POA: Diagnosis not present

## 2014-12-03 DIAGNOSIS — I739 Peripheral vascular disease, unspecified: Secondary | ICD-10-CM | POA: Diagnosis present

## 2014-12-03 DIAGNOSIS — N19 Unspecified kidney failure: Secondary | ICD-10-CM | POA: Diagnosis not present

## 2014-12-03 DIAGNOSIS — Z8673 Personal history of transient ischemic attack (TIA), and cerebral infarction without residual deficits: Secondary | ICD-10-CM | POA: Diagnosis not present

## 2014-12-03 DIAGNOSIS — F17200 Nicotine dependence, unspecified, uncomplicated: Secondary | ICD-10-CM | POA: Diagnosis present

## 2014-12-03 DIAGNOSIS — M7989 Other specified soft tissue disorders: Secondary | ICD-10-CM | POA: Diagnosis not present

## 2014-12-03 DIAGNOSIS — I25810 Atherosclerosis of coronary artery bypass graft(s) without angina pectoris: Secondary | ICD-10-CM | POA: Diagnosis present

## 2014-12-03 DIAGNOSIS — I639 Cerebral infarction, unspecified: Secondary | ICD-10-CM

## 2014-12-03 HISTORY — DX: Acute myocardial infarction, unspecified: I21.9

## 2014-12-03 HISTORY — DX: Unspecified osteoarthritis, unspecified site: M19.90

## 2014-12-03 HISTORY — DX: Pneumonia, unspecified organism: J18.9

## 2014-12-03 HISTORY — DX: Congenital malformation of lung, unspecified: Q33.9

## 2014-12-03 HISTORY — DX: Chronic obstructive pulmonary disease, unspecified: J44.9

## 2014-12-03 LAB — CBC WITH DIFFERENTIAL/PLATELET
BASOS ABS: 0 10*3/uL (ref 0.0–0.1)
Basophils Relative: 0 % (ref 0–1)
EOS PCT: 1 % (ref 0–5)
Eosinophils Absolute: 0.1 10*3/uL (ref 0.0–0.7)
HEMATOCRIT: 36.9 % — AB (ref 39.0–52.0)
Hemoglobin: 11.7 g/dL — ABNORMAL LOW (ref 13.0–17.0)
Lymphocytes Relative: 11 % — ABNORMAL LOW (ref 12–46)
Lymphs Abs: 0.9 10*3/uL (ref 0.7–4.0)
MCH: 24.2 pg — AB (ref 26.0–34.0)
MCHC: 31.7 g/dL (ref 30.0–36.0)
MCV: 76.2 fL — AB (ref 78.0–100.0)
MONO ABS: 1.1 10*3/uL — AB (ref 0.1–1.0)
Monocytes Relative: 13 % — ABNORMAL HIGH (ref 3–12)
NEUTROS ABS: 6.2 10*3/uL (ref 1.7–7.7)
NEUTROS PCT: 75 % (ref 43–77)
Platelets: 180 10*3/uL (ref 150–400)
RBC: 4.84 MIL/uL (ref 4.22–5.81)
RDW: 19.5 % — ABNORMAL HIGH (ref 11.5–15.5)
WBC: 8.2 10*3/uL (ref 4.0–10.5)

## 2014-12-03 LAB — TROPONIN I: Troponin I: 0.18 ng/mL — ABNORMAL HIGH (ref <0.031)

## 2014-12-03 LAB — COMPREHENSIVE METABOLIC PANEL
ALBUMIN: 3.2 g/dL — AB (ref 3.5–5.0)
ALT: 14 U/L — ABNORMAL LOW (ref 17–63)
ANION GAP: 10 (ref 5–15)
AST: 18 U/L (ref 15–41)
Alkaline Phosphatase: 183 U/L — ABNORMAL HIGH (ref 38–126)
BUN: 68 mg/dL — ABNORMAL HIGH (ref 6–20)
CO2: 23 mmol/L (ref 22–32)
CREATININE: 2.85 mg/dL — AB (ref 0.61–1.24)
Calcium: 8.3 mg/dL — ABNORMAL LOW (ref 8.9–10.3)
Chloride: 102 mmol/L (ref 101–111)
GFR calc Af Amer: 24 mL/min — ABNORMAL LOW (ref >60)
GFR calc non Af Amer: 20 mL/min — ABNORMAL LOW (ref >60)
Glucose, Bld: 103 mg/dL — ABNORMAL HIGH (ref 65–99)
Potassium: 4.3 mmol/L (ref 3.5–5.1)
SODIUM: 135 mmol/L (ref 135–145)
Total Bilirubin: 0.9 mg/dL (ref 0.3–1.2)
Total Protein: 6.5 g/dL (ref 6.5–8.1)

## 2014-12-03 LAB — BRAIN NATRIURETIC PEPTIDE: B Natriuretic Peptide: >4500 pg/mL — ABNORMAL HIGH (ref 0.0–100.0)

## 2014-12-03 LAB — PROTIME-INR
INR: 2.52 — ABNORMAL HIGH (ref 0.00–1.49)
Prothrombin Time: 26.8 seconds — ABNORMAL HIGH (ref 11.6–15.2)

## 2014-12-03 LAB — APTT: aPTT: 39 seconds — ABNORMAL HIGH (ref 24–37)

## 2014-12-03 LAB — TSH: TSH: 23.523 u[IU]/mL — AB (ref 0.350–4.500)

## 2014-12-03 MED ORDER — SODIUM CHLORIDE 0.9 % IJ SOLN: 3.0000 mL | INTRAMUSCULAR | Status: DC | PRN

## 2014-12-03 MED ORDER — POTASSIUM CHLORIDE CRYS ER 10 MEQ PO TBCR
10.0000 meq | EXTENDED_RELEASE_TABLET | Freq: Two times a day (BID) | ORAL | Status: DC
  Administered 2014-12-03 – 2014-12-06 (×6): 10 meq via ORAL
  Filled 2014-12-03 (×7): qty 1

## 2014-12-03 MED ORDER — ISOSORBIDE MONONITRATE ER 30 MG PO TB24
30.0000 mg | ORAL_TABLET | Freq: Every day | ORAL | Status: DC
  Administered 2014-12-03 – 2014-12-16 (×14): 30 mg via ORAL
  Filled 2014-12-03 (×14): qty 1

## 2014-12-03 MED ORDER — WARFARIN SODIUM 3 MG PO TABS
3.0000 mg | ORAL_TABLET | ORAL | Status: DC
  Administered 2014-12-04 – 2014-12-07 (×4): 3 mg via ORAL
  Filled 2014-12-03 (×5): qty 1

## 2014-12-03 MED ORDER — DM-GUAIFENESIN ER 30-600 MG PO TB12
1.0000 | ORAL_TABLET | Freq: Two times a day (BID) | ORAL | Status: DC
  Administered 2014-12-03 – 2014-12-16 (×26): 1 via ORAL
  Filled 2014-12-03 (×27): qty 1

## 2014-12-03 MED ORDER — CARVEDILOL 6.25 MG PO TABS
18.7500 mg | ORAL_TABLET | Freq: Two times a day (BID) | ORAL | Status: DC
  Administered 2014-12-03 – 2014-12-04 (×3): 18.75 mg via ORAL
  Filled 2014-12-03 (×8): qty 1

## 2014-12-03 MED ORDER — EZETIMIBE 10 MG PO TABS
10.0000 mg | ORAL_TABLET | Freq: Every day | ORAL | Status: DC
  Administered 2014-12-03 – 2014-12-16 (×14): 10 mg via ORAL
  Filled 2014-12-03 (×14): qty 1

## 2014-12-03 MED ORDER — WARFARIN - PHARMACIST DOSING INPATIENT
Freq: Every day | Status: DC
  Administered 2014-12-04 – 2014-12-05 (×2): 1
  Administered 2014-12-06 – 2014-12-07 (×2)
  Administered 2014-12-08 – 2014-12-09 (×2): 1
  Administered 2014-12-10 – 2014-12-14 (×5)

## 2014-12-03 MED ORDER — WARFARIN SODIUM 6 MG PO TABS
6.0000 mg | ORAL_TABLET | ORAL | Status: DC
  Administered 2014-12-08: 6 mg via ORAL
  Filled 2014-12-03: qty 1

## 2014-12-03 MED ORDER — ONDANSETRON HCL 4 MG/2ML IJ SOLN: 4.0000 mg | Freq: Four times a day (QID) | INTRAMUSCULAR | Status: DC | PRN

## 2014-12-03 MED ORDER — ENSURE COMPLETE PO LIQD: 237.0000 mL | Freq: Two times a day (BID) | ORAL | Status: DC

## 2014-12-03 MED ORDER — SODIUM CHLORIDE 0.9 % IJ SOLN
3.0000 mL | Freq: Two times a day (BID) | INTRAMUSCULAR | Status: DC
  Administered 2014-12-03 – 2014-12-16 (×17): 3 mL via INTRAVENOUS

## 2014-12-03 MED ORDER — SODIUM CHLORIDE 0.9 % IV SOLN
250.0000 mL | INTRAVENOUS | Status: DC | PRN
  Administered 2014-12-13: 250 mL via INTRAVENOUS

## 2014-12-03 MED ORDER — ROSUVASTATIN CALCIUM 40 MG PO TABS
40.0000 mg | ORAL_TABLET | Freq: Every day | ORAL | Status: DC
  Administered 2014-12-03 – 2014-12-16 (×14): 40 mg via ORAL
  Filled 2014-12-03 (×14): qty 1

## 2014-12-03 MED ORDER — WARFARIN SODIUM 3 MG PO TABS: 3.0000 mg | ORAL_TABLET | Freq: Every day | ORAL | Status: DC

## 2014-12-03 MED ORDER — HYDRALAZINE HCL 50 MG PO TABS
50.0000 mg | ORAL_TABLET | Freq: Three times a day (TID) | ORAL | Status: DC
  Administered 2014-12-03 – 2014-12-05 (×5): 50 mg via ORAL
  Filled 2014-12-03 (×7): qty 1

## 2014-12-03 MED ORDER — FUROSEMIDE 10 MG/ML IJ SOLN
60.0000 mg | Freq: Two times a day (BID) | INTRAMUSCULAR | Status: DC
  Administered 2014-12-03 – 2014-12-05 (×4): 60 mg via INTRAVENOUS
  Filled 2014-12-03 (×8): qty 6

## 2014-12-03 MED ORDER — LEVOTHYROXINE SODIUM 75 MCG PO TABS
75.0000 ug | ORAL_TABLET | Freq: Every day | ORAL | Status: DC
  Administered 2014-12-04 – 2014-12-16 (×13): 75 ug via ORAL
  Filled 2014-12-03 (×14): qty 1

## 2014-12-03 MED ORDER — ACETAMINOPHEN 325 MG PO TABS
650.0000 mg | ORAL_TABLET | ORAL | Status: DC | PRN
  Administered 2014-12-03 – 2014-12-14 (×13): 650 mg via ORAL
  Filled 2014-12-03 (×13): qty 2

## 2014-12-03 MED ORDER — ENSURE ENLIVE PO LIQD
237.0000 mL | Freq: Two times a day (BID) | ORAL | Status: DC
Start: 2014-12-04 — End: 2014-12-16
  Administered 2014-12-04 – 2014-12-16 (×23): 237 mL via ORAL

## 2014-12-03 NOTE — Progress Notes (Signed)
Reviewed & agree with plan  

## 2014-12-03 NOTE — H&P (Addendum)
History and Physical   Date:  12/03/2014   ID:  Clinton [name], DOB 22-Aug-1940, MRN [id number]  PCP:  No PCP Per Patient  Cardiologist:  Dr. Olga Millers     Chief Complaint  Patient presents with  . Congestive Heart Failure     History of Present Illness: Clinton [name] is a 74 y.o. male with a hx of CAD s/p CABG, PAD s/p Ao-bifem bypass and Ao-R renal bypass, carotid stenosis s/p bilateral CEA, LICA and L CCA stenting, HTN, HL, ICM, systolic HF, aortic stenosis, hypothyroidism, prior CVA, CKD.  PAD is followed by [facility].   He was seen by Dr. Hillis Range in 2012 and declined ICD implantation.  Admitted in 12/14 for a/c systolic HF.  Recent CT showed pulmonary nodules and is followed by pulmonary.  Last seen by Dr. Olga Millers 1/16.    Over the last few months, he has noted increasing LE edema, orthopnea and fatigue.  He is chronically short of breath.  He is NYHA 3.  He sleeps on 2-3 pillows.  He denies PND.  He denies syncope.  He denies chest pain.  He saw Pulmonology yesterday.  He was asked to increase his lasix.  He has not noticed much benefit.  He had a CXR yesterday that demonstrates CHF and bilateral effusions.     Studies/Reports Reviewed Today:  CXR 12/02/14 IMPRESSION: 1. Prior CABG. 2. Congestive heart failure with pulmonary interstitial edema and bilateral pleural effusions.  Echo 07/10/14 Mod LVH, EF 30-35%, inf HK, inf-lat HK, Gr 2 DD Mod AS, mean 14 mmHg, peak 25 mmHg Mild to mod MR Severe LAE Mild RVE, mild reduced RVSF Mild RAE Mod TR PASP 58 mmHg L effusion  Renal Artery [place] 12/13 R renal artery bypass patent L renal artery origin severe stenosis  Carotid US 4/13 R 40-59%; L 60-79%  Myoview 7/11 Inferior and lateral infarct with minimal ischemia in mid lateral wall and base of anterior wall, EF 29%  Past Medical History  Diagnosis Date  . Peripheral arterial disease     a. Ao-bifem bypass w/ 16x8 hemashield dacron graft, R aortorenal  bypass w/ 6mm dacron graft;  b. 09/2011 ABI: R 0/87, L 0.92.  . Carotid artery occlusion     a. 12/2002 R CEA;  b. 05/2007 L Carotid stenting;  c. 09/2008 repeat L carotid stenting 2/2 ISR;  d. 09/2011 Carotid U/S: RICA 40-59%, LICA 60-79%, >50 dist LCCA stenosis.  Marland Kitchen CAD (coronary artery disease)     a. CABG 1985;  b. 05/2002 Rotablator/PTCA to ostial LCX and RI;  c. 12/2009 MV: inf and lat infarct w/ minimal mid lateral and basilar ant ischemia, EF 29%->Med Rx.  . Cardiomyopathy, ischemic     a. 06/2010 Cardiac MRI: EF 30%, anterolat, post, inf prior subendocardial infarcts;  b. 08/2012 Echo: EF 30-35%, mild LVH mult wma's, Gr 2 DD;  c. 05/2013 Echo: EF 20-25%, diff HK, mod AS, mild MR, mod dil LA, mild to mod TR, PASP .  . Stroke     remote  . Hypertension   . Hyperlipidemia   . Tobacco abuse   . Anticoagulated on Coumadin   . CKD (chronic kidney disease), stage III   . Chronic systolic CHF (congestive heart failure)     a. 05/2013 EF 20-25%, diff HK.  . Moderate aortic stenosis     a. 05/2013 Echo: at least moderate AS (Valve area 1.07 cm2 - VTI; 1.08cm2 - Vmax).  . Hypothyroidism  a. 05/2013 TSH 50.15 - synthroid 50 mcg daily started.    Past Surgical History  Procedure Laterality Date  . Carotid endarterectomy    . Coronary artery bypass graft    . Pr vein bypass graft,aorto-fem-pop    . Ureteral artery renal bypass       Current Outpatient Prescriptions  Medication Sig Dispense Refill  . carvedilol (COREG) 12.5 MG tablet take 1 and 1/2 tablets by mouth twice a day 90 tablet 0  . dextromethorphan-guaiFENesin (MUCINEX DM) 30-600 MG per 12 hr tablet Take 1 tablet by mouth 2 (two) times daily.    Marland Kitchen. ezetimibe (ZETIA) 10 MG tablet Take 10 mg by mouth daily.      . feeding supplement, ENSURE COMPLETE, (ENSURE COMPLETE) LIQD Take 237 mLs by mouth 2 (two) times daily between meals.    . furosemide (LASIX) 40 MG tablet Take 1 tablet (40 mg total) by mouth daily. 30 tablet 6  .  hydrALAZINE (APRESOLINE) 50 MG tablet Take 1 tablet (50 mg total) by mouth 3 (three) times daily. 270 tablet 0  . isosorbide mononitrate (IMDUR) 30 MG 24 hr tablet Take 1 tablet (30 mg total) by mouth daily. 30 tablet 6  . levothyroxine (SYNTHROID, LEVOTHROID) 75 MCG tablet Take 1 tablet (75 mcg total) by mouth daily before breakfast. 30 tablet 12  . potassium chloride (K-DUR) 10 MEQ tablet Take 1 tablet (10 mEq total) by mouth daily. 30 tablet 6  . rosuvastatin (CRESTOR) 40 MG tablet Take 1 tablet (40 mg total) by mouth daily. 30 tablet 6  . warfarin (COUMADIN) 6 MG tablet take as directed BY COUMADIN CLINIC 35 tablet 3   No current facility-administered medications for this visit.    Allergies:   Codeine phosphate    Social History:  The patient  reports that he quit smoking about 18 months ago. His smoking use included Cigarettes. He has a 5 pack-year smoking history. He has never used smokeless tobacco. He reports that he does not drink alcohol or use illicit drugs.   Family History:  The patient's family history includes CAD in an other family member; Stroke in his mother. There is no history of Heart attack.    ROS:   Please see the history of present illness.   Review of Systems  Constitution: Positive for malaise/fatigue.  Cardiovascular: Positive for dyspnea on exertion, leg swelling, orthopnea and paroxysmal nocturnal dyspnea.  Respiratory: Positive for wheezing.   Hematologic/Lymphatic: Bruises/bleeds easily.  Musculoskeletal: Positive for joint pain.  All other systems reviewed and are negative.     PHYSICAL EXAM: VS:  BP 132/84 mmHg  Pulse 74  Ht 5' 11.5" (1.816 m)  Wt 178 lb (80.74 kg)  BMI 24.48 kg/m2    Wt Readings from Last 3 Encounters:  12/03/14 178 lb (80.74 kg)  12/02/14 177 lb 4.8 oz (80.423 kg)  07/30/14 155 lb 6.4 oz (70.489 kg)     GEN: Well nourished, well developed, in no acute distress HEENT: normal Neck: + JVD,  no masses Cardiac:  Normal  S1/S2, RRR; 2/6 systolic murmur RUSB,  no rubs or gallops, tight 2-3+ bilateral LE edema up to thighs, + presacral edema Respiratory:  Decreased breath sounds bilaterally, no wheezing GI:  distended, non-tender MS: no deformity or atrophy Skin: warm and dry  Neuro:  CNs II-XII intact, Strength and sensation are intact Psych: Normal affect   EKG:  EKG is ordered today.  It demonstrates:   Sinus brady, HR ~ 55, LBBB, PVCs  Recent Labs: 05/10/2014: Pro B Natriuretic peptide (BNP) 63888.0* 05/15/2014: Magnesium 2.3 05/23/2014: ALT 32 05/24/2014: Hemoglobin 11.5*; Platelets 145* 06/13/2014: BUN 33*; Creatinine, Ser 2.1*; Potassium 4.3; Sodium 134* 07/07/2014: TSH 21.341*    Lipid Panel    Component Value Date/Time   CHOL 243* 10/31/2013 0819   TRIG 102.0 10/31/2013 0819   HDL 40.90 10/31/2013 0819   CHOLHDL 6 10/31/2013 0819   VLDL 20.4 10/31/2013 0819   LDLCALC 182* 10/31/2013 0819   LDLDIRECT 186.1 09/21/2012 0754      ASSESSMENT AND PLAN:  Acute on chronic systolic CHF (congestive heart failure):  Patient presents with acute on chronic systolic CHF. He is +23 pounds since February. He notes fatigue and probably has some low output symptoms. He also has moderate aortic stenosis by echocardiogram in January. This is probably underestimated given his low EF. His aortic stenosis is likely contributing to his heart failure as well. He does admit to significant dietary indiscretion with salt. Chest x-ray yesterday demonstrates bilateral pleural effusions and evidence of CHF. The patient requires admission to the hospital for further evaluation and management. This has been discussed with the patient and he agrees to proceed.  -  [facility]  -  Start with Lasix 60 mg IV BID.  This may need to be increased given CKD.  -  Repeat Echo  -  Consider HF Team consult  -  Consider EP consult.  May benefit from considering CRT (1st degree AVB, LBBB).  Has declined ICD in  past.  Ischemic cardiomyopathy;  Continue beta blocker, hydralazine, nitrates. He is not on ACE inhibitor due to advanced chronic kidney disease. Repeat Echo.  Coronary artery disease involving native coronary artery of native heart without angina pectoris:  No angina.  Obtain serial CEs.  Continue beta-blocker, nitrates, statin. He is not on ASA as he is on Coumadin.    Moderate aortic stenosis:  Likely underestimated due to low EF.  Repeat Echo. Will have Dr. Tonny BollmanMichael Cooper review echo to see if AS is worse than estimated and if consult would be appropriate to consider TAVR.   Cerebral artery occlusion with cerebral infarction:  Followed by [facility]. Continue Coumadin.   Essential hypertension:  Controlled.   Hyperlipidemia:  Continue statin.   Lung nodule:  Followed by Pulmonology.   Renal artery stenosis:  Followed by [facility].  CKD (chronic kidney disease), stage III:  Monitor renal function closely with diuresis.     Current medicines are reviewed at length with the patient today.  Concerns regarding medicines are as outlined above.  The following changes have been made:    As above.    Labs/ tests ordered today include:    No orders of the defined types were placed in this encounter.    Disposition:   [facility].   Signed, Brynda RimScott Weaver, PA-C, [place] 12/03/2014 1:02 PM    [facility] [address], [place], [place]  [address] Phone: [phone](336) [phone]; Fax: [phone]   I have examined the patient and reviewed assessment and plan and discussed with patient on 12/04/14.  Agree with above as stated.  Volume overloaded.  Plan to diurese with IV Lasix.  3/6 systolic murmur on exam.  He feels that he urinated a lot yesterday but not very negative on I/Os.  Consider TAVR if we feel that AS has progressed and is causing the heart failure.  Bilateral leg swelling noted as well on exam.  Increase Lasix to 80 mg BID.  CKD: follow [facility]  closely.  Mailey Landstrom S.

## 2014-12-03 NOTE — Progress Notes (Signed)
ANTICOAGULATION CONSULT NOTE - Initial Consult  Pharmacy Consult for Coumadin Indication: stroke  Allergies  Allergen Reactions  . Codeine Phosphate Nausea And Vomiting    Patient Measurements: Height: 5' 11.5" (181.6 cm) Weight: 175 lb 9.6 oz (79.652 kg) IBW/kg (Calculated) : 76.45  Vital Signs: Temp: 97.4 F (36.3 C) (06/08 1644) Temp Source: Oral (06/08 1644) BP: 132/107 mmHg (06/08 1644) Pulse Rate: 68 (06/08 1644)  Labs:  Recent Labs  12/03/14 1909  HGB 11.7*  HCT 36.9*  PLT 180  APTT 39*  LABPROT 26.8*  INR 2.52*    CrCl cannot be calculated (Patient has no serum creatinine result on file.).   Medical History: Past Medical History  Diagnosis Date  . CAD (coronary artery disease)     a. CABG 1985;  b. 05/2002 Rotablator/PTCA to ostial LCX and RI;  c. 12/2009 MV: inf and lat infarct w/ minimal mid lateral and basilar ant ischemia, EF 29%->Med Rx.  . Cardiomyopathy, ischemic     a. 06/2010 Cardiac MRI: EF 30%, anterolat, post, inf prior subendocardial infarcts;  b. 08/2012 Echo: EF 30-35%, mild LVH mult wma's, Gr 2 DD;  c. 05/2013 Echo: EF 20-25%, diff HK, mod AS, mild MR, mod dil LA, mild to mod TR, PASP 63mmHg.  . Stroke     remote  . Hypertension   . Hyperlipidemia   . Tobacco abuse   . Anticoagulated on Coumadin   . Chronic systolic CHF (congestive heart failure)     a. 05/2013 EF 20-25%, diff HK.  . Moderate aortic stenosis     a. 05/2013 Echo: at least moderate AS (Valve area 1.07 cm2 - VTI; 1.08cm2 - Vmax).  . Hypothyroidism     a. 05/2013 TSH 50.15 - synthroid 50 mcg daily started.  . Peripheral arterial disease     a. Ao-bifem bypass w/ 16x8 hemashield dacron graft, R aortorenal bypass w/ 6mm dacron graft;  b. 09/2011 ABI: R 0/87, L 0.92.  . Carotid artery occlusion     a. 12/2002 R CEA;  b. 05/2007 L Carotid stenting;  c. 09/2008 repeat L carotid stenting 2/2 ISR;  d. 09/2011 Carotid U/S: RICA 40-59%, LICA 60-79%, >50 dist LCCA stenosis.  . CKD  (chronic kidney disease), stage III   . Myocardial infarction 12/2009    MV: inf and lat infarct w/ minimal mid lateral and basilar ant ischemia, EF 29%->Med Rx  . Myocardial infarction 1985    "that's why I had the bypass"  . COPD (chronic obstructive pulmonary disease)   . Pneumonia 05/2014  . Arthritis     "hands" (12/03/2014)  . 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 INR therapeutic on admission with home dose of 6 mg on Mondays, 3 mg other days Has already had dose today  Goal of Therapy:  INR 2-3 Monitor platelets by anticoagulation protocol: Yes   Plan:  Resume home dose starting tomorrow Daily INR  Thank you. Okey RegalLisa Kenyada Dosch, PharmD [phone]  12/03/2014,7:44 PM

## 2014-12-03 NOTE — Patient Instructions (Signed)
Medication Instructions:  Your physician recommends that you continue on your current medications as directed. Please refer to the Current Medication list given to you today.   Labwork: None   Testing/Procedures: None   Follow-Up: Pending   Any Other Special Instructions Will Be Listed Below (If Applicable). [facility] have [facility].

## 2014-12-03 NOTE — Progress Notes (Signed)
Cardiology notified that pt has arrived on unit.

## 2014-12-04 ENCOUNTER — Ambulatory Visit (HOSPITAL_COMMUNITY): Payer: Medicare HMO

## 2014-12-04 DIAGNOSIS — I359 Nonrheumatic aortic valve disorder, unspecified: Secondary | ICD-10-CM

## 2014-12-04 DIAGNOSIS — I5023 Acute on chronic systolic (congestive) heart failure: Secondary | ICD-10-CM

## 2014-12-04 DIAGNOSIS — I350 Nonrheumatic aortic (valve) stenosis: Secondary | ICD-10-CM

## 2014-12-04 DIAGNOSIS — I420 Dilated cardiomyopathy: Secondary | ICD-10-CM

## 2014-12-04 DIAGNOSIS — M7989 Other specified soft tissue disorders: Secondary | ICD-10-CM

## 2014-12-04 DIAGNOSIS — N184 Chronic kidney disease, stage 4 (severe): Secondary | ICD-10-CM | POA: Insufficient documentation

## 2014-12-04 LAB — BASIC METABOLIC PANEL
Anion gap: 11 (ref 5–15)
BUN: 69 mg/dL — ABNORMAL HIGH (ref 6–20)
CALCIUM: 8.1 mg/dL — AB (ref 8.9–10.3)
CHLORIDE: 103 mmol/L (ref 101–111)
CO2: 23 mmol/L (ref 22–32)
Creatinine, Ser: 2.79 mg/dL — ABNORMAL HIGH (ref 0.61–1.24)
GFR calc Af Amer: 24 mL/min — ABNORMAL LOW (ref >60)
GFR calc non Af Amer: 21 mL/min — ABNORMAL LOW (ref >60)
Glucose, Bld: 102 mg/dL — ABNORMAL HIGH (ref 65–99)
Potassium: 4.3 mmol/L (ref 3.5–5.1)
Sodium: 137 mmol/L (ref 135–145)

## 2014-12-04 LAB — PROTIME-INR
INR: 3 — AB (ref 0.00–1.49)
PROTHROMBIN TIME: 30.6 s — AB (ref 11.6–15.2)

## 2014-12-04 LAB — TROPONIN I
TROPONIN I: 0.16 ng/mL — AB (ref <0.031)
Troponin I: 0.15 ng/mL — ABNORMAL HIGH (ref <0.031)

## 2014-12-04 NOTE — Progress Notes (Signed)
SUBJECTIVE:  Breathing better  OBJECTIVE:   Vitals:   Filed Vitals:   12/03/14 2105 12/04/14 0220 12/04/14 0550 12/04/14 0940  BP: 100/75 108/61 115/73 135/48  Pulse: 63 56 56 50  Temp: 97.7 F (36.5 C) 98.3 F (36.8 C) 97.5 F (36.4 C)   TempSrc: Oral Oral Oral   Resp: 18 18    Height:      Weight:   177 lb 1.6 oz (80.332 kg)   SpO2: 96% 95% 93%    I&O's:   Intake/Output Summary (Last 24 hours) at 12/04/14 1024 Last data filed at 12/04/14 0941  Gross per 24 hour  Intake   1427 ml  Output    695 ml  Net    732 ml   TELEMETRY: Reviewed telemetry pt in NSR, LBBB, PVC:     PHYSICAL EXAM General: Well developed, well nourished, in no acute distress Head:   Normal cephalic and atramatic  Lungs:  Basilar crackles. Heart:  HRRR S1 S2  No JVD, 3/6 systolic murmur.   Abdomen: abdomen soft and non-tender Msk:  Back normal,  Normal strength and tone for age. Extremities:  Bilateral pitting edema.   Neuro: Alert and oriented. Psych:  Normal affect, responds appropriately Skin: No rash   LABS: Basic Metabolic Panel:  Recent Labs  16/10/96 1909 12/04/14 0054  NA 135 137  K 4.3 4.3  CL 102 103  CO2 23 23  GLUCOSE 103* 102*  BUN 68* 69*  CREATININE 2.85* 2.79*  CALCIUM 8.3* 8.1*   Liver Function Tests:  Recent Labs  12/03/14 1909  AST 18  ALT 14*  ALKPHOS 183*  BILITOT 0.9  PROT 6.5  ALBUMIN 3.2*   No results for input(s): LIPASE, AMYLASE in the last 72 hours. CBC:  Recent Labs  12/03/14 1909  WBC 8.2  NEUTROABS 6.2  HGB 11.7*  HCT 36.9*  MCV 76.2*  PLT 180   Cardiac Enzymes:  Recent Labs  12/03/14 1909 12/04/14 0054 12/04/14 0722  TROPONINI 0.18* 0.16* 0.15*   BNP: Invalid input(s): POCBNP D-Dimer: No results for input(s): DDIMER in the last 72 hours. Hemoglobin A1C: No results for input(s): HGBA1C in the last 72 hours. Fasting Lipid Panel: No results for input(s): CHOL, HDL, LDLCALC, TRIG, CHOLHDL, LDLDIRECT in the last 72  hours. Thyroid Function Tests:  Recent Labs  12/03/14 1909  TSH 23.523*   Anemia Panel: No results for input(s): VITAMINB12, FOLATE, FERRITIN, TIBC, IRON, RETICCTPCT in the last 72 hours. Coag Panel:   Lab Results  Component Value Date   INR 3.00* 12/04/2014   INR 2.52* 12/03/2014   INR 4.1 11/27/2014    RADIOLOGY: Dg Chest 2 View  12/02/2014   CLINICAL DATA:  Pneumonia.  Right pleural effusion.  EXAM: CHEST  2 VIEW  COMPARISON:  CT 07/22/2014 .  FINDINGS: Mediastinum hilar structures normal. Cardiomegaly with pulmonary vascular congestion. Diffuse interstitial prominence and bilateral pleural effusions noted. These findings are consistent congestive heart failure. No pneumothorax.  IMPRESSION: 1. Prior CABG. 2. Congestive heart failure with pulmonary interstitial edema and bilateral pleural effusions.   Electronically Signed   By: Maisie Fus  Register   On: 12/02/2014 09:32      ASSESSMENT: Roosvelt Harps: Volume overloaded, aortic stenosis, acute on chronic systolic heart failure EF 30-35%  Volume overloaded. Plan to diurese with IV Lasix. 3/6 systolic murmur on exam. He feels that he urinated a lot yesterday but not very negative on I/Os. Consider TAVR if we feel that AS has progressed  and is causing the heart failure.  Bilateral leg swelling noted as well on exam. Increase Lasix to 80 mg BID.  CKD: follow [facility] closely.   Corky Crafts, MD  12/04/2014  10:24 AM  r

## 2014-12-04 NOTE — Progress Notes (Signed)
  Echocardiogram 2D Echocardiogram has been performed.  Clinton [name] 12/04/2014, 1:58 PM

## 2014-12-04 NOTE — Care Management Note (Signed)
Case Management Note  Patient Details  Name: Clinton [name] MRN: [id number] Date of Birth: [birth year]  Subjective/Objective:      Admitted with CHF              Action/Plan: Lives at home with son, CM following for DCP;  Expected Discharge Date:     12/08/2014             Expected Discharge Plan:  Home w Home Health Services possibly  Discharge planning Services  CM Consult  Status of Service:   In progress  Reola Mosher [phone] 12/04/2014, 10:18 AM

## 2014-12-04 NOTE — Progress Notes (Signed)
ANTICOAGULATION CONSULT NOTE - Follow Up Consult  Pharmacy Consult for Coumadin Indication: h/o CVA  Allergies  Allergen Reactions  . Codeine Phosphate Nausea And Vomiting    Patient Measurements: Height: 5' 11.5" (181.6 cm) Weight: 177 lb 1.6 oz (80.332 kg) (sxcale A) IBW/kg (Calculated) : 76.45 Heparin Dosing Weight:    Vital Signs: Temp: 97.5 F (36.4 C) (06/09 0550) Temp Source: Oral (06/09 0550) BP: 135/48 mmHg (06/09 0940) Pulse Rate: 50 (06/09 0940)  Labs:  Recent Labs  12/03/14 1909 12/04/14 0054 12/04/14 0722  HGB 11.7*  --   --   HCT 36.9*  --   --   PLT 180  --   --   APTT 39*  --   --   LABPROT 26.8* 30.6*  --   INR 2.52* 3.00*  --   CREATININE 2.85* 2.79*  --   TROPONINI 0.18* 0.16* 0.15*    Estimated Creatinine Clearance: 25.1 mL/min (by C-G formula based on Cr of 2.79).   Assessment:  Anticoagulation-  Coum-Rx:  Hx stroke.  Hg 11.7, pltc 180, (INR 4.1 on 6/2- on 3mg  qday & 6mg  MonFri).  Admit INR 2.52. INR today in goal at 3.  PTA: 3mg  qday x 6mg  on Mon    Goal of Therapy:  INR 2-3 Monitor platelets by anticoagulation protocol: Yes   Plan:  Daily INR Coumadin 3mg  daily & 6mg  Mon Watch for s/s of bleeding  Merilynn Finland, [employer] 12/04/2014,10:53 AM

## 2014-12-05 ENCOUNTER — Inpatient Hospital Stay: Admission: RE | Admit: 2014-12-05 | Payer: Medicare HMO | Source: Ambulatory Visit

## 2014-12-05 ENCOUNTER — Telehealth: Payer: Self-pay | Admitting: Adult Health

## 2014-12-05 DIAGNOSIS — I5043 Acute on chronic combined systolic (congestive) and diastolic (congestive) heart failure: Principal | ICD-10-CM

## 2014-12-05 LAB — BASIC METABOLIC PANEL
Anion gap: 12 (ref 5–15)
BUN: 70 mg/dL — ABNORMAL HIGH (ref 6–20)
CO2: 24 mmol/L (ref 22–32)
Calcium: 8.1 mg/dL — ABNORMAL LOW (ref 8.9–10.3)
Chloride: 100 mmol/L — ABNORMAL LOW (ref 101–111)
Creatinine, Ser: 2.77 mg/dL — ABNORMAL HIGH (ref 0.61–1.24)
GFR calc non Af Amer: 21 mL/min — ABNORMAL LOW (ref >60)
GFR, EST AFRICAN AMERICAN: 24 mL/min — AB (ref >60)
GLUCOSE: 99 mg/dL (ref 65–99)
Potassium: 4.5 mmol/L (ref 3.5–5.1)
SODIUM: 136 mmol/L (ref 135–145)

## 2014-12-05 LAB — PROTIME-INR
INR: 2.6 — ABNORMAL HIGH (ref 0.00–1.49)
Prothrombin Time: 27.5 seconds — ABNORMAL HIGH (ref 11.6–15.2)

## 2014-12-05 MED ORDER — FUROSEMIDE 10 MG/ML IJ SOLN
80.0000 mg | Freq: Once | INTRAMUSCULAR | Status: AC
  Administered 2014-12-05: 80 mg via INTRAVENOUS

## 2014-12-05 MED ORDER — DEXTROSE 5 % IV SOLN
15.0000 mg/h | INTRAVENOUS | Status: DC
  Administered 2014-12-05 – 2014-12-07 (×5): 20 mg/h via INTRAVENOUS
  Administered 2014-12-09 – 2014-12-12 (×4): 15 mg/h via INTRAVENOUS
  Filled 2014-12-05 (×22): qty 25

## 2014-12-05 MED ORDER — MILRINONE IN DEXTROSE 20 MG/100ML IV SOLN
0.2500 ug/kg/min | INTRAVENOUS | Status: DC
  Administered 2014-12-05 – 2014-12-12 (×10): 0.25 ug/kg/min via INTRAVENOUS
  Filled 2014-12-05 (×11): qty 100

## 2014-12-05 MED ORDER — CARVEDILOL 6.25 MG PO TABS
18.7500 mg | ORAL_TABLET | Freq: Two times a day (BID) | ORAL | Status: DC
  Administered 2014-12-05: 18.75 mg via ORAL
  Filled 2014-12-05 (×3): qty 1

## 2014-12-05 MED ORDER — HYDRALAZINE HCL 25 MG PO TABS
12.5000 mg | ORAL_TABLET | Freq: Three times a day (TID) | ORAL | Status: DC
  Administered 2014-12-05 – 2014-12-08 (×6): 12.5 mg via ORAL
  Filled 2014-12-05 (×11): qty 0.5

## 2014-12-05 MED ORDER — CARVEDILOL 3.125 MG PO TABS
3.1250 mg | ORAL_TABLET | Freq: Two times a day (BID) | ORAL | Status: DC
  Administered 2014-12-05 – 2014-12-16 (×22): 3.125 mg via ORAL
  Filled 2014-12-05 (×25): qty 1

## 2014-12-05 MED ORDER — METOLAZONE 5 MG PO TABS
5.0000 mg | ORAL_TABLET | Freq: Every day | ORAL | Status: DC
  Administered 2014-12-05 – 2014-12-11 (×7): 5 mg via ORAL
  Filled 2014-12-05 (×8): qty 1

## 2014-12-05 NOTE — Progress Notes (Signed)
ANTICOAGULATION CONSULT NOTE - Follow Up Consult  Pharmacy Consult for Warfarin Indication: Hx CVA  Allergies  Allergen Reactions  . Codeine Phosphate Nausea And Vomiting    Patient Measurements: Height: 5' 11.5" (181.6 cm) Weight: 174 lb 1.6 oz (78.971 kg) (scale a) IBW/kg (Calculated) : 76.45  Vital Signs: Temp: 97.4 F (36.3 C) (06/10 0535) Temp Source: Oral (06/10 0535) BP: 134/55 mmHg (06/10 0832) Pulse Rate: 54 (06/10 0832)  Labs:  Recent Labs  12/03/14 1909 12/04/14 0054 12/04/14 0722 12/05/14 0305  HGB 11.7*  --   --   --   HCT 36.9*  --   --   --   PLT 180  --   --   --   APTT 39*  --   --   --   LABPROT 26.8* 30.6*  --  27.5*  INR 2.52* 3.00*  --  2.60*  CREATININE 2.85* 2.79*  --  2.77*  TROPONINI 0.18* 0.16* 0.15*  --     Estimated Creatinine Clearance: 25.3 mL/min (by C-G formula based on Cr of 2.77).  Assessment: 14 YOM who presented on 6/8 with acute on chronic HF exacerbation. PTA the patient was on warfarin for hx CVA - pharmacy was consulted to resume this admission.   The patient had a recent dose reduction at the anticoag clinic visit from 6/2. Admit INR was therapeutic on admit at 2.52 on PTA dose of 3 mg daily EXCEPT for 6 mg on Mondays only.  INR today remains therapeutic (INR 2.6 << 3, goal of 2-3). Will monitor closely for increased sensitivity with concurrent HF exacerbation. No CBC today - no overt s/sx of bleeding noted  The patient was re-educated on warfarin today.  Goal of Therapy:  INR 2-3   Plan:  1. Warfarin 3 mg x 1 dose at 1800 today 2. Will continue to monitor for any signs/symptoms of bleeding and will follow up with PT/INR in the a.m.   Georgina Pillion, PharmD, BCPS Clinical Pharmacist Pager: [phone] 12/05/2014 10:50 AM

## 2014-12-05 NOTE — Progress Notes (Signed)
SUBJECTIVE:  Breathing better. Still swelling.  I>O consistently.  OBJECTIVE:   Vitals:   Filed Vitals:   12/05/14 0134 12/05/14 0535 12/05/14 0546 12/05/14 0832  BP: 120/56 86/64 131/65 134/55  Pulse: 54 52  54  Temp: 97.6 F (36.4 C) 97.4 F (36.3 C)    TempSrc: Oral Oral    Resp: 18 14    Height:      Weight:  174 lb 1.6 oz (78.971 kg)    SpO2: 94% 100%     I&O's:    Intake/Output Summary (Last 24 hours) at 12/05/14 1039 Last data filed at 12/05/14 1032  Gross per 24 hour  Intake   2927 ml  Output   2050 ml  Net    877 ml   TELEMETRY: Reviewed telemetry pt in NSR, LBBB, PVC:     PHYSICAL EXAM General: Well developed, well nourished, in no acute distress Head:   Normal cephalic and atramatic  Lungs:  Basilar crackles. Heart:  HRRR S1 S2  No JVD, 3/6 systolic murmur.   Abdomen: abdomen soft and non-tender Msk:  Back normal,  Normal strength and tone for age. Extremities:  Bilateral pitting edema.   Neuro: Alert and oriented. Psych:  Normal affect, responds appropriately Skin: No rash   LABS: Basic Metabolic Panel:  Recent Labs  40/98/11 0054 12/05/14 0305  NA 137 136  K 4.3 4.5  CL 103 100*  CO2 23 24  GLUCOSE 102* 99  BUN 69* 70*  CREATININE 2.79* 2.77*  CALCIUM 8.1* 8.1*   Liver Function Tests:  Recent Labs  12/03/14 1909  AST 18  ALT 14*  ALKPHOS 183*  BILITOT 0.9  PROT 6.5  ALBUMIN 3.2*   No results for input(s): LIPASE, AMYLASE in the last 72 hours. CBC:  Recent Labs  12/03/14 1909  WBC 8.2  NEUTROABS 6.2  HGB 11.7*  HCT 36.9*  MCV 76.2*  PLT 180   Cardiac Enzymes:  Recent Labs  12/03/14 1909 12/04/14 0054 12/04/14 0722  TROPONINI 0.18* 0.16* 0.15*   BNP: Invalid input(s): POCBNP D-Dimer: No results for input(s): DDIMER in the last 72 hours. Hemoglobin A1C: No results for input(s): HGBA1C in the last 72 hours. Fasting Lipid Panel: No results for input(s): CHOL, HDL, LDLCALC, TRIG, CHOLHDL, LDLDIRECT in the  last 72 hours. Thyroid Function Tests:  Recent Labs  12/03/14 1909  TSH 23.523*   Anemia Panel: No results for input(s): VITAMINB12, FOLATE, FERRITIN, TIBC, IRON, RETICCTPCT in the last 72 hours. Coag Panel:   Lab Results  Component Value Date   INR 2.60* 12/05/2014   INR 3.00* 12/04/2014   INR 2.52* 12/03/2014    RADIOLOGY: Dg Chest 2 View  12/02/2014   CLINICAL DATA:  Pneumonia.  Right pleural effusion.  EXAM: CHEST  2 VIEW  COMPARISON:  CT 07/22/2014 .  FINDINGS: Mediastinum hilar structures normal. Cardiomegaly with pulmonary vascular congestion. Diffuse interstitial prominence and bilateral pleural effusions noted. These findings are consistent congestive heart failure. No pneumothorax.  IMPRESSION: 1. Prior CABG. 2. Congestive heart failure with pulmonary interstitial edema and bilateral pleural effusions.   Electronically Signed   By: Maisie Fus  Register   On: 12/02/2014 09:32      ASSESSMENT: Clinton [name]: Volume overloaded, aortic stenosis, acute on chronic systolic heart failure EF 30-35%  Volume overloaded.Evidence of right heart failure.  Not diuresing with IV Lasix. 3/6 systolic murmur on exam. He feels that he urinated a lot yesterday but not very negative on I/Os. D/w Dr. Excell Seltzer.  Perisstent low EF with heavily calcified aortic valve.  Gradient likely underestimated by low EF.  Valve leaflet movement severely impaired.  Will have heart failure team consult for optimization.  COnsider Percutaneous balloon aortic valvuloplasty, as he has too many comorbidities at this time to consider TAVR.  With increase CO, renal function may improve and then he could be considered for TAVR at that time.   CKD: follow [facility] closely.   Corky Crafts, MD  12/05/2014  10:39 AM  r

## 2014-12-05 NOTE — Telephone Encounter (Signed)
Called and spoke to Turner with CT. Kennyth Arnold has cancelled CT as pt has been admitted.   Will send to [facility] as FYI.

## 2014-12-05 NOTE — Consult Note (Addendum)
Advanced Heart Failure Team Consult Note  Referring Physician: Dr Graciela Husbands Primary Physician: Primary Cardiologist:  Dr Graciela Husbands  Reason for Consultation: HF  HPI:    Clinton [name] is a 74 y.o. male with a hx of CAD s/p CABG, PAD s/p Ao-bifem bypass and Ao-R renal bypass, carotid stenosis s/p bilateral CEA, LICA and L CCA stenting, HTN, HL, ICM, systolic HF, aortic stenosis, hypothyroidism, prior CVA, CKD. PAD is followed by [facility]. He was seen by Dr. Hillis Range in 2012 and declined ICD implantation. Admitted in 12/14 for a/c systolic HF. Recent CT showed pulmonary nodules and is followed by pulmonary. Last seen by Dr. Olga Millers 1/16.   Over the last few months, he has noted increasing LE edema, orthopnea and fatigue. He is chronically short of breath. He is NYHA 3. He sleeps on 2-3 pillows. He denies PND. He denies syncope. He denies chest pain. He saw Pulmonology yesterday. He was asked to increase his lasix. He has not noticed much benefit. He had a CXR yesterday that demonstrates CHF and bilateral effusions. He admitted to significant dietary indiscretion with salt.  Says he has been chronically SOB since he had pneumonia in 12/15.  Says he can rest without SOB but has DOE with minimal activity. States he is very fatigued and can barely do anything at home.  Thinks he is up about 20 lbs at 177 lbs from a baseline of 155 lbs.  Says he quit smoking 11/26/2014. Hx of 50 pack years.  Echo 12/04/14 - 30-35%, global hypokinesis with paradoxical septal motion. Grade 2 diastolic dysfunction. Moderate AS. Complete report below.    (similar to echo 07/10/14, EF unchanged)    Review of Systems: [y] = yes, [ ]  = no   General: Weight gain [place] ]; Weight loss [ ] ; Anorexia [ ] ; Fatigue [place] ]; Fever [ ] ; Chills [ ] ; Weakness [ ]   Cardiac: Chest pain/pressure [ ] ; Resting SOB [ ] ; Exertional SOB [y]; Orthopnea [y]; Pedal Edema [y]; Palpitations [ ] ; Syncope [ ] ; Presyncope [ ] ; Paroxysmal  nocturnal dyspnea[ ]   Pulmonary: Cough [ ] ; Wheezing[ ] ; Hemoptysis[ ] ; Sputum [ ] ; Snoring [ ]   GI: Vomiting[ ] ; Dysphagia[ ] ; Melena[ ] ; Hematochezia [ ] ; Heartburn[ ] ; Abdominal pain [ ] ; Constipation [ ] ; Diarrhea [ ] ; BRBPR [ ]   GU: Hematuria[ ] ; Dysuria [ ] ; Nocturia[ ]   Vascular: Pain in legs with walking [ ] ; Pain in feet with lying flat [ ] ; Non-healing sores [ ] ; Stroke [ ] ; TIA [ ] ; Slurred speech [ ] ;  Neuro: Headaches[ ] ; Vertigo[ ] ; Seizures[ ] ; Paresthesias[ ] ;Blurred vision [ ] ; Diplopia [ ] ; Vision changes [ ]   Ortho/Skin: Arthritis [ ] ; Joint pain [ ] ; Muscle pain [ ] ; Joint swelling [ ] ; Back Pain [ ] ; Rash [ ]   Psych: Depression[ ] ; Anxiety[ ]   Heme: Bleeding problems [ ] ; Clotting disorders [ ] ; Anemia [ ]   Endocrine: Diabetes [ ] ; Thyroid dysfunction[ ]   Home Medications Prior to Admission medications   Medication Sig Start Date End Date Taking? Authorizing Provider  carvedilol (COREG) 12.5 MG tablet take 1 and 1/2 tablets by mouth twice a day 11/04/14  Yes Lewayne Bunting, MD  ezetimibe (ZETIA) 10 MG tablet Take 10 mg by mouth at bedtime.    Yes Historical Provider, MD  feeding supplement, ENSURE COMPLETE, (ENSURE COMPLETE) LIQD Take 237 mLs by mouth 2 (two) times daily between meals. Patient taking differently: Take 237 mLs by mouth daily.  05/24/14  Yes Starleen Arms, MD  furosemide (LASIX) 40 MG tablet Take 1 tablet (40 mg total) by mouth daily. 06/11/14  Yes Dwana Melena, PA-C  hydrALAZINE (APRESOLINE) 50 MG tablet Take 1 tablet (50 mg total) by mouth 3 (three) times daily. 11/27/14  Yes Lewayne Bunting, MD  levothyroxine (SYNTHROID, LEVOTHROID) 75 MCG tablet Take 1 tablet (75 mcg total) by mouth daily before breakfast. 07/11/14  Yes Lewayne Bunting, MD  potassium chloride (K-DUR) 10 MEQ tablet Take 1 tablet (10 mEq total) by mouth daily. Patient taking differently: Take 10 mEq by mouth at bedtime.  06/11/14  Yes Dwana Melena, PA-C  rosuvastatin (CRESTOR) 40 MG  tablet Take 1 tablet (40 mg total) by mouth daily. Patient taking differently: Take 40 mg by mouth at bedtime.  06/19/13  Yes Rosalio Macadamia, NP  Umeclidinium Bromide (INCRUSE ELLIPTA) 62.5 MCG/INH AEPB Inhale 1 puff into the lungs daily.   Yes Historical Provider, MD  warfarin (COUMADIN) 6 MG tablet take as directed BY COUMADIN CLINIC Patient taking differently: 6 mg on Monday.  3 mg on all other days. 11/27/14  Yes Lewayne Bunting, MD  isosorbide mononitrate (IMDUR) 30 MG 24 hr tablet take 1 tablet by mouth once daily 12/04/14   Lewayne Bunting, MD    Past Medical History: Past Medical History  Diagnosis Date  . CAD (coronary artery disease)     a. CABG 1985;  b. 05/2002 Rotablator/PTCA to ostial LCX and RI;  c. 12/2009 MV: inf and lat infarct w/ minimal mid lateral and basilar ant ischemia, EF 29%->Med Rx.  . Cardiomyopathy, ischemic     a. 06/2010 Cardiac MRI: EF 30%, anterolat, post, inf prior subendocardial infarcts;  b. 08/2012 Echo: EF 30-35%, mild LVH mult wma's, Gr 2 DD;  c. 05/2013 Echo: EF 20-25%, diff HK, mod AS, mild MR, mod dil LA, mild to mod TR, PASP .  . Stroke     remote  . Hypertension   . Hyperlipidemia   . Tobacco abuse   . Anticoagulated on Coumadin   . Chronic systolic CHF (congestive heart failure)     a. 05/2013 EF 20-25%, diff HK.  . Moderate aortic stenosis     a. 05/2013 Echo: at least moderate AS (Valve area 1.07 cm2 - VTI; 1.08cm2 - Vmax).  . Hypothyroidism     a. 05/2013 TSH 50.15 - synthroid 50 mcg daily started.  . Peripheral arterial disease     a. Ao-bifem bypass w/ 16x8 hemashield dacron graft, R aortorenal bypass w/ 6mm dacron graft;  b. 09/2011 ABI: R 0/87, L 0.92.  . Carotid artery occlusion     a. 12/2002 R CEA;  b. 05/2007 L Carotid stenting;  c. 09/2008 repeat L carotid stenting 2/2 ISR;  d. 09/2011 Carotid U/S: RICA 40-59%, LICA 60-79%, >50 dist LCCA stenosis.  . CKD (chronic kidney disease), stage III   . Myocardial infarction 12/2009    MV:  inf and lat infarct w/ minimal mid lateral and basilar ant ischemia, EF 29%->Med Rx  . Myocardial infarction 1985    "that's why I had the bypass"  . COPD (chronic obstructive pulmonary disease)   . Pneumonia 05/2014  . Arthritis     "hands" (12/03/2014)  . 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 Carotid endarterectomy Right 12/2002    hx/notes 12/03/2014  . Pr  vein bypass graft,aorto-fem-pop    . Aorta - bilateral femoral artery bypass graft      hx/notes 12/03/2014  . Aortic/renal bypass      hx/notes 12/03/2014  . Carotid stent Left 05/2007; 09/2008    Hattie Perch 12/03/2014  . Coronary angioplasty with stent placement  05/2002    Rotablator/PTCA to ostial LCX and RI  . Inguinal hernia repair Right   . Back surgery    . Lumbar disc surgery  X 3?  Marland Kitchen Coronary artery bypass graft  1985    "CABG X5"    Family History: Family History  Problem Relation Age of Onset  . CAD    . Stroke Mother   . Heart attack Neg Hx     Social History: History   Social History  . Marital Status: Divorced    Spouse Name: N/A  . Number of Children: N/A  . Years of Education: N/A   Social History Main Topics  . Smoking status: Former Smoker -- 1.00 packs/day for 50 years    Types: Cigarettes    Quit date: 11/26/2014  . Smokeless tobacco: Never Used  . Alcohol Use: No  . Drug Use: No  . Sexual Activity: No   Other Topics Concern  . None   Social History Narrative   Lives in [place] with his son.  They eat out often and otherwise eat a lot of prepared/processed/microwave-type meals.  He continues to smoke a few cigarettes/day.    Allergies:  Allergies  Allergen Reactions  . Codeine Phosphate Nausea And Vomiting    Objective:    Vital Signs:   Temp:  [97.4 F (36.3 C)-97.9 F (36.6 C)] 97.4 F (36.3 C) (06/10 0535) Pulse Rate:  [52-61] 54 (06/10 0832) Resp:  [14-18] 14 (06/10 0535) BP:  (86-134)/(55-79) 134/55 mmHg (06/10 0832) SpO2:  [94 %-100 %] 100 % (06/10 0535) Weight:  [174 lb 1.6 oz (78.971 kg)] 174 lb 1.6 oz (78.971 kg) (06/10 0535) Last BM Date: 12/04/14  Weight change: Filed Weights   12/03/14 1644 12/04/14 0550 12/05/14 0535  Weight: 175 lb 9.6 oz (79.652 kg) 177 lb 1.6 oz (80.332 kg) 174 lb 1.6 oz (78.971 kg)    Intake/Output:   Intake/Output Summary (Last 24 hours) at 12/05/14 1147 Last data filed at 12/05/14 1032  Gross per 24 hour  Intake   2927 ml  Output   2050 ml  Net    877 ml     Physical Exam: General:  Elderly and ill appearing. Some SOB with speaking.  Sitting up on edge of bed. HEENT: normal Neck: supple. JVP jaw. Carotids 2+ bilat; no bruits. No lymphadenopathy or thryomegaly appreciated. Cor: PMI nondisplaced. Regular rate & rhythm. 2/6 AS with moderately depressed S2 2/6 MR Lungs: Bibasilar crackles/diminished + wheezing Abdomen: nontender. Tight, moderately distended. Midline hernia present, easily reducible, non tender. No hepatosplenomegaly. No bruits or masses. Good bowel sounds. Extremities: no cyanosis, clubbing, rash. 3-4+ pitting edema bilateral LEs up to knees. Neuro: alert & orientedx3, cranial nerves grossly intact. moves all 4 extremities w/o difficulty. Affect pleasant  Telemetry: NSR 50-60s with PVCs.  Labs: Basic Metabolic Panel:  Recent Labs Lab 12/03/14 1909 12/04/14 0054 12/05/14 0305  NA 135 137 136  K 4.3 4.3 4.5  CL 102 103 100*  CO2 23 23 24   GLUCOSE 103* 102* 99  BUN 68* 69* 70*  CREATININE 2.85* 2.79* 2.77*  CALCIUM 8.3* 8.1* 8.1*    Liver Function Tests:  Recent Labs Lab 12/03/14  1909  AST 18  ALT 14*  ALKPHOS 183*  BILITOT 0.9  PROT 6.5  ALBUMIN 3.2*   No results for input(s): LIPASE, AMYLASE in the last 168 hours. No results for input(s): AMMONIA in the last 168 hours.  CBC:  Recent Labs Lab 12/03/14 1909  WBC 8.2  NEUTROABS 6.2  HGB 11.7*  HCT 36.9*  MCV 76.2*  PLT 180     Cardiac Enzymes:  Recent Labs Lab 12/03/14 1909 12/04/14 0054 12/04/14 0722  TROPONINI 0.18* 0.16* 0.15*    BNP: BNP (last 3 results)  Recent Labs  12/03/14 1909  BNP >4500.0*    ProBNP (last 3 results)  Recent Labs  05/10/14 0935  PROBNP 63888.0*     CBG: No results for input(s): GLUCAP in the last 168 hours.  Coagulation Studies:  Recent Labs  12/03/14 1909 12/04/14 0054 12/05/14 0305  LABPROT 26.8* 30.6* 27.5*  INR 2.52* 3.00* 2.60*    Other results: EKG  Imaging:  No results found.  Latest Echo  12/04/14 LV EF: 30% -  35%  ------------------------------------------------------------------- Indications:   Aortic stenosis 424.1.  ------------------------------------------------------------------- History:  Risk factors: Chronic kidney disease. Dilated Cardiomyopathy. Leg swelling.  ------------------------------------------------------------------- Study Conclusions  - Left ventricle: The cavity size was normal. There was moderate concentric hypertrophy. Systolic function was moderately to severely reduced. The estimated ejection fraction was in the range of 30% to 35%. Wall motion was normal; there were no regional wall motion abnormalities. Features are consistent with a pseudonormal left ventricular filling pattern, with concomitant abnormal relaxation and increased filling pressure (grade 2 diastolic dysfunction). Doppler parameters are consistent with elevated ventricular end-diastolic filling pressure. - Ventricular septum: Septal motion showed paradox. The contour showed diastolic flattening and systolic flattening. - Aortic valve: Valve mobility was restricted. There was moderate stenosis. There was trivial regurgitation. Valve area (VTI): 0.99 cm^2. Valve area (Vmax): 0.87 cm^2. Valve area (Vmean): 0.85 cm^2. - Aortic root: The aortic root was normal in size. - Mitral valve: There was moderate  regurgitation. Valve area by pressure half-time: 2.12 cm^2. Valve area by continuity equation (using LVOT flow): 1.32 cm^2. - Left atrium: The atrium was moderately dilated. - Right ventricle: The cavity size was mildly dilated. Wall thickness was normal. Pacer wire or catheter noted in right ventricle. Systolic function was mildly reduced. - Right atrium: The atrium was moderately dilated. Pacer wire or catheter noted in right atrium. - Tricuspid valve: There was severe regurgitation. - Pulmonic valve: There was mild regurgitation. - Pulmonary arteries: Systolic pressure was severely increased. PA peak pressure: 67 mm Hg (S).  Impressions:  - LVEF is 30-35%, there is global hypokinesis with paradoxical septal motion. Grade 2 diastolic dysfunction with severely elevated filling pressures. There is systolic and diastolic septal flattening consistent with RV pressure and volume overload. RV is mildly dilated with mild systolic dysfunction. There is severe pulmonary hypertension.  Medications:     Current Medications: . carvedilol  18.75 mg Oral BID WC  . dextromethorphan-guaiFENesin  1 tablet Oral BID  . ezetimibe  10 mg Oral Daily  . feeding supplement (ENSURE ENLIVE)  237 mL Oral BID BM  . furosemide  60 mg Intravenous BID  . hydrALAZINE  50 mg Oral TID  . isosorbide mononitrate  30 mg Oral Daily  . levothyroxine  75 mcg Oral QAC breakfast  . potassium chloride  10 mEq Oral BID  . rosuvastatin  40 mg Oral Daily  . sodium chloride  3 mL Intravenous Q12H  .  warfarin  3 mg Oral Once per day on Sun Tue Wed Thu Fri Sat  . [START ON 12/08/2014] warfarin  6 mg Oral Q Mon-1800  . Warfarin - Pharmacist Dosing Inpatient   Does not apply q1800     Infusions:      Assessment:   1. Acute on chronic systolic and diastolic CHF, NYHA Class IIIb - EF 30-35%, on Coreg 2. Acute on CKD stage 4, GFR 15-29 - monitor closely with diuresis. 3. Congested dilated  cardiomyopathy 4. Moderate AS 5. CAD, hx of CABG - crestor  6. HTN - on hydralazine, imdur 7. History of stroke - on Coumadin. 8. PAD 9. Acute on chronic respiratory failure 10. Ascites  Right now poor candidate for TAVR with multiple co-morbidities, considering TAVR vs Percutaneous balloon aortic valvulopasty per Gen cards.  Goal is to optimize treatment to improve CO and renal function.  Fluid status still high with 3+ edema and JVD.  Will place TED hose and up titrate Lasix.  Cr currently 2.77.  Down from admit but up from baseline.  Cr 2.1 five months ago. May consider inotrope support for low output, will consult MD.   Length of Stay: 2 Otilio Saber, PA-C 12/05/2014 11:51 AM   Advanced Heart Failure Team Pager [phone] (M-F; 7a - 4p)  Please contact [facility] for night-coverage after hours (4p -7a ) and weekends on [email or web address] Patient seen and examined with Otilio Saber, PA-C. We discussed all aspects of the encounter. I agree with the assessment and plan as stated above.   He is quite ill. Exam and clinical course concerning for low output HF/cardiogenic shock with marked volume overload. Not responding to IV lasix 60 bid.   Will start milrinone 0.25, lasix gtt at 20, metolazone 5 daily. Cut carvedilol way back. Will send for paracentesis to alleviate renal vein pressure. The AHF team will assume care.   The patient is critically ill with multiple organ systems failure and requires high complexity decision making for assessment and support, frequent evaluation and titration of therapies, application of advanced monitoring technologies and extensive interpretation of multiple databases.   Critical Care Time devoted to patient care services described in this note is 35 Minutes.    Shikha Bibb,MD 2:42 PM

## 2014-12-06 ENCOUNTER — Inpatient Hospital Stay (HOSPITAL_COMMUNITY): Payer: Medicare HMO

## 2014-12-06 DIAGNOSIS — N179 Acute kidney failure, unspecified: Secondary | ICD-10-CM

## 2014-12-06 DIAGNOSIS — N183 Chronic kidney disease, stage 3 (moderate): Secondary | ICD-10-CM

## 2014-12-06 LAB — BASIC METABOLIC PANEL
Anion gap: 12 (ref 5–15)
BUN: 71 mg/dL — ABNORMAL HIGH (ref 6–20)
CO2: 24 mmol/L (ref 22–32)
CREATININE: 2.49 mg/dL — AB (ref 0.61–1.24)
Calcium: 8.5 mg/dL — ABNORMAL LOW (ref 8.9–10.3)
Chloride: 99 mmol/L — ABNORMAL LOW (ref 101–111)
GFR, EST AFRICAN AMERICAN: 28 mL/min — AB (ref >60)
GFR, EST NON AFRICAN AMERICAN: 24 mL/min — AB (ref >60)
Glucose, Bld: 106 mg/dL — ABNORMAL HIGH (ref 65–99)
Potassium: 4.4 mmol/L (ref 3.5–5.1)
SODIUM: 135 mmol/L (ref 135–145)

## 2014-12-06 LAB — T4, FREE: Free T4: 0.85 ng/dL (ref 0.61–1.12)

## 2014-12-06 LAB — MAGNESIUM: MAGNESIUM: 2.4 mg/dL (ref 1.7–2.4)

## 2014-12-06 LAB — PROTIME-INR
INR: 2.6 — AB (ref 0.00–1.49)
PROTHROMBIN TIME: 27.5 s — AB (ref 11.6–15.2)

## 2014-12-06 MED ORDER — POTASSIUM CHLORIDE CRYS ER 20 MEQ PO TBCR
20.0000 meq | EXTENDED_RELEASE_TABLET | Freq: Two times a day (BID) | ORAL | Status: DC
  Administered 2014-12-06 – 2014-12-12 (×12): 20 meq via ORAL
  Filled 2014-12-06 (×14): qty 1

## 2014-12-06 NOTE — Progress Notes (Signed)
ANTICOAGULATION CONSULT NOTE - Follow Up Consult  Pharmacy Consult for Warfarin Indication: Hx CVA  Allergies  Allergen Reactions  . Codeine Phosphate Nausea And Vomiting    Patient Measurements: Height: 5' 11.5" (181.6 cm) Weight: 172 lb 3.2 oz (78.109 kg) (scale a) IBW/kg (Calculated) : 76.45  Vital Signs: Temp: 97.5 F (36.4 C) (06/11 0530) Temp Source: Oral (06/11 0530) BP: 100/54 mmHg (06/11 0908) Pulse Rate: 68 (06/11 0530)  Labs:  Recent Labs  12/03/14 1909 12/04/14 0054 12/04/14 0722 12/05/14 0305 12/06/14 0224  HGB 11.7*  --   --   --   --   HCT 36.9*  --   --   --   --   PLT 180  --   --   --   --   APTT 39*  --   --   --   --   LABPROT 26.8* 30.6*  --  27.5* 27.5*  INR 2.52* 3.00*  --  2.60* 2.60*  CREATININE 2.85* 2.79*  --  2.77* 2.49*  TROPONINI 0.18* 0.16* 0.15*  --   --     Estimated Creatinine Clearance: 28.2 mL/min (by C-G formula based on Cr of 2.49).  Assessment: 73 YOM who presented on 6/8 with acute on chronic HF exacerbation. Was on warfarin prior to admissioin for hx CVA.  The patient had a recent dose reduction at the anticoag clinic visit from 6/2, when INR was 4.1. Admit INR was therapeutic at 2.52 on PTA dose of 3 mg daily EXCEPT for 6 mg on Mondays only.  INR remains therapeutic, 2.60 x 2 consecutive days.  Goal of Therapy:  INR 2-3   Plan:   Continue home Coumadin regimen of 3 mg daily except 6 mg on Mondays.  Daily PT/INR for now, but will reduce frequency if stable.  CBC in am.  Nicolette Bang, RPh Pager: [phone] 12/06/2014 2:04 PM

## 2014-12-06 NOTE — Progress Notes (Signed)
Advanced Heart Failure Team Progress Note   Subjective   Clinton [name] is a 74 y.o. male with a hx of CAD s/p CABG, PAD s/p Ao-bifem bypass and Ao-R renal bypass, carotid stenosis s/p bilateral CEA, LICA and L CCA stenting, HTN, HL, ICM, systolic HF, aortic stenosis, hypothyroidism, prior CVA, CKD. PAD is followed by [facility].Admitted with low output HF, worsening renal function and marked volume overload.    Milrinone started 6/10. Carvedilol decreased. Lasix increased and metolazone added. Referred for paracentesis but not enough ascites. Urine output up to 4L but had high intake too. Weight down 2 pounds. Creatinine 2.77 -> 2.49. Feeling much better.    Echo 12/04/14 - 30-35%, global hypokinesis with paradoxical septal motion. Grade 2 diastolic dysfunction. Moderate AS. Complete report below.    (similar to echo 07/10/14, EF unchanged)   Objective:    Vital Signs:   Temp:  [97 F (36.1 C)-97.5 F (36.4 C)] 97.5 F (36.4 C) (06/11 0530) Pulse Rate:  [58-68] 68 (06/11 0530) Resp:  [18] 18 (06/11 0530) BP: (96-155)/(54-78) 100/54 mmHg (06/11 0908) SpO2:  [98 %] 98 % (06/11 0530) Weight:  [78.109 kg (172 lb 3.2 oz)] 78.109 kg (172 lb 3.2 oz) (06/11 0530) Last BM Date: 12/05/14  Weight change: Filed Weights   12/04/14 0550 12/05/14 0535 12/06/14 0530  Weight: 80.332 kg (177 lb 1.6 oz) 78.971 kg (174 lb 1.6 oz) 78.109 kg (172 lb 3.2 oz)    Intake/Output:   Intake/Output Summary (Last 24 hours) at 12/06/14 1405 Last data filed at 12/06/14 1000  Gross per 24 hour  Intake 1894.21 ml  Output   3550 ml  Net -1655.79 ml     Physical Exam: General:  Elderly and ill appearing. Some SOB with speaking.  Sitting up on edge of bed. HEENT: normal Neck: supple. JVP jaw. Carotids 2+ bilat; no bruits. No lymphadenopathy or thryomegaly appreciated. Cor: PMI nondisplaced. Regular rate & rhythm. 2/6 AS with moderately depressed S2 2/6 MR Lungs: Bibasilar crackles/diminished  Abdomen:  nontender. Tight, moderately distended. Midline hernia present, easily reducible, non tender. No hepatosplenomegaly. No bruits or masses. Good bowel sounds. Extremities: no cyanosis, clubbing, rash. 3-4+ pitting edema bilateral LEs into thighs Neuro: alert & orientedx3, cranial nerves grossly intact. moves all 4 extremities w/o difficulty. Affect pleasant  Telemetry: NSR 50-60s with PVCs.  Labs: Basic Metabolic Panel:  Recent Labs Lab 12/03/14 1909 12/04/14 0054 12/05/14 0305 12/06/14 0224  NA 135 137 136 135  K 4.3 4.3 4.5 4.4  CL 102 103 100* 99*  CO2 23 23 24 24   GLUCOSE 103* 102* 99 106*  BUN 68* 69* 70* 71*  CREATININE 2.85* 2.79* 2.77* 2.49*  CALCIUM 8.3* 8.1* 8.1* 8.5*  MG  --   --   --  2.4    Liver Function Tests:  Recent Labs Lab 12/03/14 1909  AST 18  ALT 14*  ALKPHOS 183*  BILITOT 0.9  PROT 6.5  ALBUMIN 3.2*   No results for input(s): LIPASE, AMYLASE in the last 168 hours. No results for input(s): AMMONIA in the last 168 hours.  CBC:  Recent Labs Lab 12/03/14 1909  WBC 8.2  NEUTROABS 6.2  HGB 11.7*  HCT 36.9*  MCV 76.2*  PLT 180    Cardiac Enzymes:  Recent Labs Lab 12/03/14 1909 12/04/14 0054 12/04/14 0722  TROPONINI 0.18* 0.16* 0.15*    BNP: BNP (last 3 results)  Recent Labs  12/03/14 1909  BNP >4500.0*    ProBNP (last 3  results)  Recent Labs  05/10/14 0935  PROBNP 63888.0*     CBG: No results for input(s): GLUCAP in the last 168 hours.  Coagulation Studies:  Recent Labs  12/03/14 1909 12/04/14 0054 12/05/14 0305 12/06/14 0224  LABPROT 26.8* 30.6* 27.5* 27.5*  INR 2.52* 3.00* 2.60* 2.60*    Other results:   Imaging: US Abdomen Limited  12/06/2014   CLINICAL DATA:  74 year old male presents for therapeutic paracentesis for ascites.  EXAM: LIMITED ABDOMEN ULTRASOUND FOR ASCITES  TECHNIQUE: Limited ultrasound survey for ascites was performed in all four abdominal quadrants.  COMPARISON:  CT scan of the  abdomen and pelvis 05/10/2014  FINDINGS: Sonographic interrogation of the 4 quadrants of the abdomen demonstrates trace ascites in the perihepatic space and right lower quadrant. The ascites is inadequate for percutaneous drainage. Therefore, paracentesis was not performed.  IMPRESSION: Trace ascites which is of too small volume for paracentesis.  Signed,  Sterling Big, MD  [facility]  [facility]   Electronically Signed   By: Malachy Moan M.D.   On: 12/06/2014 10:13    Latest Echo  12/04/14 LV EF: 30% -  35%  ------------------------------------------------------------------- Indications:   Aortic stenosis 424.1.  ------------------------------------------------------------------- History:  Risk factors: Chronic kidney disease. Dilated Cardiomyopathy. Leg swelling.  ------------------------------------------------------------------- Study Conclusions  - Left ventricle: The cavity size was normal. There was moderate concentric hypertrophy. Systolic function was moderately to severely reduced. The estimated ejection fraction was in the range of 30% to 35%. Wall motion was normal; there were no regional wall motion abnormalities. Features are consistent with a pseudonormal left ventricular filling pattern, with concomitant abnormal relaxation and increased filling pressure (grade 2 diastolic dysfunction). Doppler parameters are consistent with elevated ventricular end-diastolic filling pressure. - Ventricular septum: Septal motion showed paradox. The contour showed diastolic flattening and systolic flattening. - Aortic valve: Valve mobility was restricted. There was moderate stenosis. There was trivial regurgitation. Valve area (VTI): 0.99 cm^2. Valve area (Vmax): 0.87 cm^2. Valve area (Vmean): 0.85 cm^2. - Aortic root: The aortic root was normal in size. - Mitral valve: There was moderate  regurgitation. Valve area by pressure half-time: 2.12 cm^2. Valve area by continuity equation (using LVOT flow): 1.32 cm^2. - Left atrium: The atrium was moderately dilated. - Right ventricle: The cavity size was mildly dilated. Wall thickness was normal. Pacer wire or catheter noted in right ventricle. Systolic function was mildly reduced. - Right atrium: The atrium was moderately dilated. Pacer wire or catheter noted in right atrium. - Tricuspid valve: There was severe regurgitation. - Pulmonic valve: There was mild regurgitation. - Pulmonary arteries: Systolic pressure was severely increased. PA peak pressure: 67 mm Hg (S).  Impressions:  - LVEF is 30-35%, there is global hypokinesis with paradoxical septal motion. Grade 2 diastolic dysfunction with severely elevated filling pressures. There is systolic and diastolic septal flattening consistent with RV pressure and volume overload. RV is mildly dilated with mild systolic dysfunction. There is severe pulmonary hypertension.  Medications:     Current Medications: . carvedilol  3.125 mg Oral BID WC  . dextromethorphan-guaiFENesin  1 tablet Oral BID  . ezetimibe  10 mg Oral Daily  . feeding supplement (ENSURE ENLIVE)  237 mL Oral BID BM  . hydrALAZINE  12.5 mg Oral TID  . isosorbide mononitrate  30 mg Oral Daily  . levothyroxine  75 mcg Oral QAC breakfast  . metolazone  5 mg Oral Daily  . potassium chloride  10 mEq Oral BID  .  rosuvastatin  40 mg Oral Daily  . sodium chloride  3 mL Intravenous Q12H  . warfarin  3 mg Oral Once per day on Sun Tue Wed Thu Fri Sat  . [START ON 12/08/2014] warfarin  6 mg Oral Q Mon-1800  . Warfarin - Pharmacist Dosing Inpatient   Does not apply q1800    Infusions: . furosemide (LASIX) infusion 20 mg/hr (12/06/14 0225)  . milrinone 0.25 mcg/kg/min (12/06/14 0206)     Assessment:   1. Acute on chronic systolic and diastolic CHF, NYHA Class IV - EF 30-35%, on  Coreg 2. Acute on CKD stage 4, GFR 15-29 - monitor closely with diuresis. 3. Congested dilated cardiomyopathy 4. Moderate AS 5. CAD, hx of CABG - crestor  6. HTN - on hydralazine, imdur 7. History of stroke - on Coumadin. 8. PAD 9. Acute on chronic respiratory failure 10. Ascites  Much improved on current regimen. Still up about 20 pounds. Will continue. Reinforced need to limit po intake. Watch K+.   Once diureses will try to wean milrinone. If unable to wean will need RHC.    Betsy Rosello,MD 2:05 PM

## 2014-12-07 LAB — CBC
HEMATOCRIT: 31.6 % — AB (ref 39.0–52.0)
HEMOGLOBIN: 10 g/dL — AB (ref 13.0–17.0)
MCH: 23.6 pg — AB (ref 26.0–34.0)
MCHC: 31.6 g/dL (ref 30.0–36.0)
MCV: 74.7 fL — ABNORMAL LOW (ref 78.0–100.0)
Platelets: 160 10*3/uL (ref 150–400)
RBC: 4.23 MIL/uL (ref 4.22–5.81)
RDW: 19.2 % — ABNORMAL HIGH (ref 11.5–15.5)
WBC: 7.2 10*3/uL (ref 4.0–10.5)

## 2014-12-07 LAB — BASIC METABOLIC PANEL
ANION GAP: 14 (ref 5–15)
BUN: 68 mg/dL — AB (ref 6–20)
CO2: 25 mmol/L (ref 22–32)
CREATININE: 2.58 mg/dL — AB (ref 0.61–1.24)
Calcium: 8.5 mg/dL — ABNORMAL LOW (ref 8.9–10.3)
Chloride: 93 mmol/L — ABNORMAL LOW (ref 101–111)
GFR calc non Af Amer: 23 mL/min — ABNORMAL LOW (ref >60)
GFR, EST AFRICAN AMERICAN: 27 mL/min — AB (ref >60)
Glucose, Bld: 100 mg/dL — ABNORMAL HIGH (ref 65–99)
POTASSIUM: 4.2 mmol/L (ref 3.5–5.1)
Sodium: 132 mmol/L — ABNORMAL LOW (ref 135–145)

## 2014-12-07 LAB — PROTIME-INR
INR: 2.77 — ABNORMAL HIGH (ref 0.00–1.49)
Prothrombin Time: 28.8 seconds — ABNORMAL HIGH (ref 11.6–15.2)

## 2014-12-07 LAB — T4: T4, Total: 3.9 ug/dL — ABNORMAL LOW (ref 4.5–12.0)

## 2014-12-07 NOTE — Progress Notes (Addendum)
Patient ID: Clinton [name], male   DOB: 27-Jul-1940, 74 y.o.   MRN: [id number] Advanced Heart Failure Team Progress Note   Subjective   Clinton [name] is a 74 y.o. male with a hx of CAD s/p CABG, PAD s/p Ao-bifem bypass and Ao-R renal bypass, carotid stenosis s/p bilateral CEA, LICA and L CCA stenting, HTN, HL, ICM, systolic HF, aortic stenosis, hypothyroidism, prior CVA, CKD. PAD is followed by [facility].Admitted with low output HF, worsening renal function and marked volume overload.    Milrinone started 6/10. Carvedilol decreased. Lasix increased and metolazone added. Referred for paracentesis but not enough ascites.  This morning, creatinine stable.  Weight down 6 lbs.  Good urine output on current regimen.   Echo 12/04/14 - 30-35%, global hypokinesis with paradoxical septal motion. Grade 2 diastolic dysfunction. Moderate AS. Complete report below.    (similar to echo 07/10/14, EF unchanged)   Objective:    Vital Signs:   Temp:  [97.7 F (36.5 C)-98 F (36.7 C)] 97.7 F (36.5 C) (06/12 0625) Pulse Rate:  [60-72] 67 (06/12 0625) Resp:  [18-20] 20 (06/12 0625) BP: (95-128)/(51-57) 128/53 mmHg (06/12 0625) SpO2:  [94 %-96 %] 95 % (06/12 0625) Weight:  [166 lb 4.8 oz (75.433 kg)] 166 lb 4.8 oz (75.433 kg) (06/12 0625) Last BM Date: 12/06/14  Weight change: Filed Weights   12/05/14 0535 12/06/14 0530 12/07/14 0625  Weight: 174 lb 1.6 oz (78.971 kg) 172 lb 3.2 oz (78.109 kg) 166 lb 4.8 oz (75.433 kg)    Intake/Output:   Intake/Output Summary (Last 24 hours) at 12/07/14 0913 Last data filed at 12/07/14 0800  Gross per 24 hour  Intake 2018.44 ml  Output   3600 ml  Net -1581.56 ml     Physical Exam: General:  Elderly and ill appearing. Some SOB with speaking.  Sitting up on edge of bed. HEENT: normal Neck: supple. JVP 14-16 cm. Carotids 2+ bilat; no bruits. No lymphadenopathy or thryomegaly appreciated. Cor: PMI nondisplaced. Regular rate & rhythm. 2/6 AS with moderately  depressed S2, 2/6 MR Lungs: Bibasilar crackles/diminished  Abdomen: nontender. Tight, moderately distended. Midline hernia present, easily reducible, non tender. No hepatosplenomegaly. No bruits or masses. Good bowel sounds. Extremities: no cyanosis, clubbing, rash. 3-4+ pitting edema bilateral LEs into thighs Neuro: alert & orientedx3, cranial nerves grossly intact. moves all 4 extremities w/o difficulty. Affect pleasant  Telemetry: NSR 50-60s with PVCs.  Labs: Basic Metabolic Panel:  Recent Labs Lab 12/03/14 1909 12/04/14 0054 12/05/14 0305 12/06/14 0224 12/07/14 0309  NA 135 137 136 135 132*  K 4.3 4.3 4.5 4.4 4.2  CL 102 103 100* 99* 93*  CO2 GLUCOSE 103* 102* 99 106* 100*  BUN 68* 69* 70* 71* 68*  CREATININE 2.85* 2.79* 2.77* 2.49* 2.58*  CALCIUM 8.3* 8.1* 8.1* 8.5* 8.5*  MG  --   --   --  2.4  --     Liver Function Tests:  Recent Labs Lab 12/03/14 1909  AST 18  ALT 14*  ALKPHOS 183*  BILITOT 0.9  PROT 6.5  ALBUMIN 3.2*   No results for input(s): LIPASE, AMYLASE in the last 168 hours. No results for input(s): AMMONIA in the last 168 hours.  CBC:  Recent Labs Lab 12/03/14 1909 12/07/14 0309  WBC 8.2 7.2  NEUTROABS 6.2  --   HGB 11.7* 10.0*  HCT 36.9* 31.6*  MCV 76.2* 74.7*  PLT 180 160    Cardiac Enzymes:  Recent  Labs Lab 12/03/14 1909 12/04/14 0054 12/04/14 0722  TROPONINI 0.18* 0.16* 0.15*    BNP: BNP (last 3 results)  Recent Labs  12/03/14 1909  BNP >4500.0*    ProBNP (last 3 results)  Recent Labs  05/10/14 0935  PROBNP 63888.0*     CBG: No results for input(s): GLUCAP in the last 168 hours.  Coagulation Studies:  Recent Labs  12/05/14 0305 12/06/14 0224 12/07/14 0309  LABPROT 27.5* 27.5* 28.8*  INR 2.60* 2.60* 2.77*    Other results:   Imaging: US Abdomen Limited  12/06/2014   CLINICAL DATA:  74 year old male presents for therapeutic paracentesis for ascites.  EXAM: LIMITED ABDOMEN  ULTRASOUND FOR ASCITES  TECHNIQUE: Limited ultrasound survey for ascites was performed in all four abdominal quadrants.  COMPARISON:  CT scan of the abdomen and pelvis 05/10/2014  FINDINGS: Sonographic interrogation of the 4 quadrants of the abdomen demonstrates trace ascites in the perihepatic space and right lower quadrant. The ascites is inadequate for percutaneous drainage. Therefore, paracentesis was not performed.  IMPRESSION: Trace ascites which is of too small volume for paracentesis.  Signed,  Sterling Big, MD  [facility]  [facility]   Electronically Signed   By: Malachy Moan M.D.   On: 12/06/2014 10:13    Latest Echo  12/04/14 LV EF: 30% -  35%  ------------------------------------------------------------------- Indications:   Aortic stenosis 424.1.  ------------------------------------------------------------------- History:  Risk factors: Chronic kidney disease. Dilated Cardiomyopathy. Leg swelling.  ------------------------------------------------------------------- Study Conclusions  - Left ventricle: The cavity size was normal. There was moderate concentric hypertrophy. Systolic function was moderately to severely reduced. The estimated ejection fraction was in the range of 30% to 35%. Wall motion was normal; there were no regional wall motion abnormalities. Features are consistent with a pseudonormal left ventricular filling pattern, with concomitant abnormal relaxation and increased filling pressure (grade 2 diastolic dysfunction). Doppler parameters are consistent with elevated ventricular end-diastolic filling pressure. - Ventricular septum: Septal motion showed paradox. The contour showed diastolic flattening and systolic flattening. - Aortic valve: Valve mobility was restricted. There was moderate stenosis. There was trivial regurgitation. Valve area (VTI): 0.99 cm^2. Valve area  (Vmax): 0.87 cm^2. Valve area (Vmean): 0.85 cm^2. - Aortic root: The aortic root was normal in size. - Mitral valve: There was moderate regurgitation. Valve area by pressure half-time: 2.12 cm^2. Valve area by continuity equation (using LVOT flow): 1.32 cm^2. - Left atrium: The atrium was moderately dilated. - Right ventricle: The cavity size was mildly dilated. Wall thickness was normal. Pacer wire or catheter noted in right ventricle. Systolic function was mildly reduced. - Right atrium: The atrium was moderately dilated. Pacer wire or catheter noted in right atrium. - Tricuspid valve: There was severe regurgitation. - Pulmonic valve: There was mild regurgitation. - Pulmonary arteries: Systolic pressure was severely increased. PA peak pressure: 67 mm Hg (S).  Impressions:  - LVEF is 30-35%, there is global hypokinesis with paradoxical septal motion. Grade 2 diastolic dysfunction with severely elevated filling pressures. There is systolic and diastolic septal flattening consistent with RV pressure and volume overload. RV is mildly dilated with mild systolic dysfunction. There is severe pulmonary hypertension.  Medications:     Current Medications: . carvedilol  3.125 mg Oral BID WC  . dextromethorphan-guaiFENesin  1 tablet Oral BID  . ezetimibe  10 mg Oral Daily  . feeding supplement (ENSURE ENLIVE)  237 mL Oral BID BM  . hydrALAZINE  12.5 mg Oral TID  . isosorbide  mononitrate  30 mg Oral Daily  . levothyroxine  75 mcg Oral QAC breakfast  . metolazone  5 mg Oral Daily  . potassium chloride  20 mEq Oral BID  . rosuvastatin  40 mg Oral Daily  . sodium chloride  3 mL Intravenous Q12H  . warfarin  3 mg Oral Once per day on Sun Tue Wed Thu Fri Sat  . [START ON 12/08/2014] warfarin  6 mg Oral Q Mon-1800  . Warfarin - Pharmacist Dosing Inpatient   Does not apply q1800    Infusions: . furosemide (LASIX) infusion 20 mg/hr (12/07/14 0502)  .  milrinone 0.25 mcg/kg/min (12/06/14 1700)     Assessment:   1. Acute on chronic systolic and diastolic CHF, NYHA Class IV: Ischemic cardiomyopathy. EF 30-35%.  He has declined ICD in the past.  2. Acute on CKD stage 4, GFR 15-29: monitor closely with diuresis. 3. Congested dilated cardiomyopathy 4. Moderate AS 5. CAD, hx of CABG: crestor 40mg  6. HTN: on hydralazine/Imdur 7. History of stroke: on Coumadin. 8. PAD 9. Acute on chronic respiratory failure 10. Ascites  He is improved on the current regimen but remains volume overloaded.  Creatinine is stable.  He will need ongoing diuresis.  I will continue current IV Lasix gtt and metolazone.   Continue current Coreg and hydralazine/Imdur.  No ACEI with CKD stage IV.   Rhythm is difficult, looks regular but hard to see P waves.  Will get ECG this morning.   Once he is more euvolemic, will try to wean milrinone. If unable to wean will need RHC.    Fransico Meadow 9:13 AM  12/07/2014

## 2014-12-07 NOTE — Progress Notes (Signed)
Patient was noted to be in an accelerated junctional rhythm by CCMD.  EKG done to confirm, showed patient to be in a ventricular rhythm with frequent PVC-not accelerated junctional EKG to chart. Will continue to monitor. Blood pressure 108/51, pulse 60, temperature 98 F (36.7 C), temperature source Oral, resp. rate 18, height 5' 11.5" (1.816 m), weight 78.109 kg (172 lb 3.2 oz), SpO2 94 %. Clinton [name]

## 2014-12-07 NOTE — Progress Notes (Signed)
ANTICOAGULATION CONSULT NOTE - Follow Up Consult  Pharmacy Consult for Coumadin Indication: h/o CVA  Allergies  Allergen Reactions  . Codeine Phosphate Nausea And Vomiting    Patient Measurements: Height: 5' 11.5" (181.6 cm) Weight: 166 lb 4.8 oz (75.433 kg) (scale A) IBW/kg (Calculated) : 76.45 Heparin Dosing Weight:    Vital Signs: Temp: 97.7 F (36.5 C) (06/12 0625) Temp Source: Oral (06/12 0625) BP: 128/53 mmHg (06/12 0625) Pulse Rate: 67 (06/12 0625)  Labs:  Recent Labs  12/05/14 0305 12/06/14 0224 12/07/14 0309  HGB  --   --  10.0*  HCT  --   --  31.6*  PLT  --   --  160  LABPROT 27.5* 27.5* 28.8*  INR 2.60* 2.60* 2.77*  CREATININE 2.77* 2.49* 2.58*    Estimated Creatinine Clearance: 26.8 mL/min (by C-G formula based on Cr of 2.58).   Assessment: CC:  CHF exacerbation- increasing LE edema, orthopnea, fatigue & chronically SOB.  Lasix dose increased 6/7 without much benefit.  Anticoagulation: Warfarin PTA for hx CVA (no Hx AFib).  Admit INR 2.52 on 6/8. INR 2.77 today * PTA dose 3 mg daily EXCEPT for 6 mg on Mondays only   Goal of Therapy:  INR 2-3 Monitor platelets by anticoagulation protocol: Yes   Plan:  - Cont home dose of 3 mg daily EXCEPT for 6 mg on Mondays - Daily PT/INR, but decr to MWF if stable   Pasty Spillers 12/07/2014,3:08 PM

## 2014-12-08 ENCOUNTER — Inpatient Hospital Stay (HOSPITAL_COMMUNITY): Payer: Medicare HMO

## 2014-12-08 LAB — CBC
HEMATOCRIT: 30.9 % — AB (ref 39.0–52.0)
HEMOGLOBIN: 9.6 g/dL — AB (ref 13.0–17.0)
MCH: 23.1 pg — ABNORMAL LOW (ref 26.0–34.0)
MCHC: 31.1 g/dL (ref 30.0–36.0)
MCV: 74.5 fL — ABNORMAL LOW (ref 78.0–100.0)
PLATELETS: 151 10*3/uL (ref 150–400)
RBC: 4.15 MIL/uL — AB (ref 4.22–5.81)
RDW: 19 % — AB (ref 11.5–15.5)
WBC: 7.4 10*3/uL (ref 4.0–10.5)

## 2014-12-08 LAB — T3, FREE: T3, Free: 1.5 pg/mL — ABNORMAL LOW (ref 2.0–4.4)

## 2014-12-08 LAB — BASIC METABOLIC PANEL
Anion gap: 12 (ref 5–15)
Anion gap: 15 (ref 5–15)
BUN: 74 mg/dL — AB (ref 6–20)
BUN: 79 mg/dL — AB (ref 6–20)
CHLORIDE: 92 mmol/L — AB (ref 101–111)
CHLORIDE: 89 mmol/L — AB (ref 101–111)
CO2: 26 mmol/L (ref 22–32)
CO2: 28 mmol/L (ref 22–32)
Calcium: 8.1 mg/dL — ABNORMAL LOW (ref 8.9–10.3)
Calcium: 8.6 mg/dL — ABNORMAL LOW (ref 8.9–10.3)
Creatinine, Ser: 2.64 mg/dL — ABNORMAL HIGH (ref 0.61–1.24)
Creatinine, Ser: 2.75 mg/dL — ABNORMAL HIGH (ref 0.61–1.24)
GFR calc Af Amer: 26 mL/min — ABNORMAL LOW (ref >60)
GFR calc non Af Amer: 22 mL/min — ABNORMAL LOW (ref >60)
GFR calc non Af Amer: 21 mL/min — ABNORMAL LOW (ref >60)
GFR, EST AFRICAN AMERICAN: 25 mL/min — AB (ref >60)
Glucose, Bld: 116 mg/dL — ABNORMAL HIGH (ref 65–99)
Glucose, Bld: 116 mg/dL — ABNORMAL HIGH (ref 65–99)
POTASSIUM: 4.2 mmol/L (ref 3.5–5.1)
POTASSIUM: 4.7 mmol/L (ref 3.5–5.1)
Sodium: 130 mmol/L — ABNORMAL LOW (ref 135–145)
Sodium: 132 mmol/L — ABNORMAL LOW (ref 135–145)

## 2014-12-08 LAB — PROTIME-INR
INR: 2.39 — ABNORMAL HIGH (ref 0.00–1.49)
Prothrombin Time: 25.8 seconds — ABNORMAL HIGH (ref 11.6–15.2)

## 2014-12-08 LAB — T3: T3 TOTAL: 44 ng/dL — AB (ref 71–180)

## 2014-12-08 LAB — IRON AND TIBC
Iron: 48 ug/dL (ref 45–182)
SATURATION RATIOS: 9 % — AB (ref 17.9–39.5)
TIBC: 547 ug/dL — ABNORMAL HIGH (ref 250–450)
UIBC: 499 ug/dL

## 2014-12-08 LAB — MAGNESIUM: Magnesium: 2.6 mg/dL — ABNORMAL HIGH (ref 1.7–2.4)

## 2014-12-08 LAB — URIC ACID: Uric Acid, Serum: 10.6 mg/dL — ABNORMAL HIGH (ref 4.4–7.6)

## 2014-12-08 LAB — FERRITIN: Ferritin: 40 ng/mL (ref 24–336)

## 2014-12-08 NOTE — Progress Notes (Addendum)
AT 1801, BP 87/54, HR 77.  At 1836, BP 90/47, HR 73.  Ms. Clinton [name], Cordelia Poche has been notified. Orders have been placed discontinuing the Hydralazine 12.5mg  PO, and continue closely monitoring pt's BP and HR.  Will report to PM nurse of same.

## 2014-12-08 NOTE — Progress Notes (Signed)
Pt complaining of leg cramps and requested to remove TED hose.  He refuses to have them on at this time.

## 2014-12-08 NOTE — Progress Notes (Signed)
TED hose placed on pt as per order.

## 2014-12-08 NOTE — Progress Notes (Signed)
CCMD reported pt had a 17-beat run of V-Tach.  Physician notified.  EKG will be performed, as well.

## 2014-12-08 NOTE — Progress Notes (Signed)
CCMD reported pt had a 20-beat run of V-Tach.  Notified Dr. Gala Romney, who states that pt will prob continue to have these, and to call him if pt has a sustained V-Tach of 1-1.5 min.

## 2014-12-08 NOTE — Progress Notes (Signed)
ANTICOAGULATION CONSULT NOTE - Follow Up Consult  Pharmacy Consult for Coumadin Indication: h/o CVA  Allergies  Allergen Reactions  . Codeine Phosphate Nausea And Vomiting    Patient Measurements: Height: 5' 11.5" (181.6 cm) Weight: 163 lb 14.4 oz (74.345 kg) (Scale A) IBW/kg (Calculated) : 76.45 Heparin Dosing Weight:    Vital Signs: Temp: 97.4 F (36.3 C) (06/13 0528) Temp Source: Oral (06/13 0528) BP: 114/63 mmHg (06/13 0528) Pulse Rate: 72 (06/13 0528)  Labs:  Recent Labs  12/06/14 0224 12/07/14 0309 12/08/14 0350  HGB  --  10.0* 9.6*  HCT  --  31.6* 30.9*  PLT  --  160 151  LABPROT 27.5* 28.8* 25.8*  INR 2.60* 2.77* 2.39*  CREATININE 2.49* 2.58* 2.64*    Estimated Creatinine Clearance: 25.8 mL/min (by C-G formula based on Cr of 2.64).   Assessment: CC:  CHF exacerbation- increasing LE edema, orthopnea, fatigue & chronically SOB.  Good uop on furosemide drip 20 and milrinone 0.25.  Anticoagulation: Warfarin PTA for hx CVA (no Hx AFib).  Admit INR 2.39  PTA dose 3 mg daily EXCEPT for 6 mg on Mondays only   Goal of Therapy:  INR 2-3 Monitor platelets by anticoagulation protocol: Yes   Plan:  - Cont home dose of 3 mg daily EXCEPT for 6 mg on Mondays - Daily PT/INR  Leota Sauers Pharm.D. CPP, BCPS Clinical Pharmacist [phone] 12/08/2014 9:48 AM

## 2014-12-08 NOTE — Progress Notes (Signed)
Patient ID: Clinton [name], male   DOB: [birth year], 74 y.o.   MRN: [id number] Advanced Heart Failure Team Progress Note   Subjective   Clinton [name] is a 74 y.o. male with a hx of CAD s/p CABG, PAD s/p Ao-bifem bypass and Ao-R renal bypass, carotid stenosis s/p bilateral CEA, LICA and L CCA stenting, HTN, HL, ICM, systolic HF, aortic stenosis, hypothyroidism, prior CVA, CKD. PAD is followed by [facility].Admitted with low output HF, worsening renal function and marked volume overload.    Milrinone started 6/10. Carvedilol decreased. Lasix increased and metolazone added. Referred for paracentesis but not enough ascites.  This morning, creatinine stable, 2.49 -> 2.58 -> 2.64 today.  Weight down 3 lbs (14 pounds total).  Good urine output on current regimen. C/o knee and ankle pain.  Echo 12/04/14 - 30-35%, global hypokinesis with paradoxical septal motion. Grade 2 diastolic dysfunction. Moderate AS. Complete report below.    (similar to echo 07/10/14, EF unchanged)   Objective:    Vital Signs:   Temp:  [97.3 F (36.3 C)-97.6 F (36.4 C)] 97.4 F (36.3 C) (06/13 0528) Pulse Rate:  [72-86] 72 (06/13 0528) Resp:  [18-20] 18 (06/13 0528) BP: (103-132)/(50-63) 114/63 mmHg (06/13 0528) SpO2:  [95 %-98 %] 95 % (06/13 0528) Weight:  [163 lb 14.4 oz (74.345 kg)] 163 lb 14.4 oz (74.345 kg) (06/13 0528) Last BM Date: 12/07/14  Weight change: Filed Weights   12/06/14 0530 12/07/14 0625 12/08/14 0528  Weight: 172 lb 3.2 oz (78.109 kg) 166 lb 4.8 oz (75.433 kg) 163 lb 14.4 oz (74.345 kg)    Intake/Output:   Intake/Output Summary (Last 24 hours) at 12/08/14 0736 Last data filed at 12/08/14 4098  Gross per 24 hour  Intake 1935.56 ml  Output   4575 ml  Net -2639.44 ml     Physical Exam: General:  Elderly. Sitting in chair. NAD HEENT: normal Neck: supple. JVP 10-11 cm. Carotids 2+ bilat; no bruits. No lymphadenopathy or thryomegaly appreciated. Cor: PMI nondisplaced. Regular rate & rhythm.  2/6 AS with moderately depressed S2, 2/6 MR Lungs: Bibasilar crackles/diminished  Abdomen: nontender.  moderately distended. Midline hernia present, easily reducible, non tender. No hepatosplenomegaly. No bruits or masses. Good bowel sounds. Extremities: no cyanosis, clubbing, rash. 3+ pitting edema bilateral LEs into thighs Neuro: alert & orientedx3, cranial nerves grossly intact. moves all 4 extremities w/o difficulty. Affect pleasant  Telemetry: NSR 60-70s with PVCs.  Labs: Basic Metabolic Panel:  Recent Labs Lab 12/04/14 0054 12/05/14 0305 12/06/14 0224 12/07/14 0309 12/08/14 0350  NA 137 136 135 132* 130*  K 4.3 4.5 4.4 4.2 4.2  CL 103 100* 99* 93* 92*  CO2 GLUCOSE 102* 99 106* 100* 116*  BUN 69* 70* 71* 68* 74*  CREATININE 2.79* 2.77* 2.49* 2.58* 2.64*  CALCIUM 8.1* 8.1* 8.5* 8.5* 8.1*  MG  --   --  2.4  --   --     Liver Function Tests:  Recent Labs Lab 12/03/14 1909  AST 18  ALT 14*  ALKPHOS 183*  BILITOT 0.9  PROT 6.5  ALBUMIN 3.2*   No results for input(s): LIPASE, AMYLASE in the last 168 hours. No results for input(s): AMMONIA in the last 168 hours.  CBC:  Recent Labs Lab 12/03/14 1909 12/07/14 0309 12/08/14 0350  WBC 8.2 7.2 7.4  NEUTROABS 6.2  --   --   HGB 11.7* 10.0* 9.6*  HCT 36.9* 31.6* 30.9*  MCV 76.2* 74.7*  74.5*  PLT 180 160 151    Cardiac Enzymes:  Recent Labs Lab 12/03/14 1909 12/04/14 0054 12/04/14 0722  TROPONINI 0.18* 0.16* 0.15*    BNP: BNP (last 3 results)  Recent Labs  12/03/14 1909  BNP >4500.0*    ProBNP (last 3 results)  Recent Labs  05/10/14 0935  PROBNP 63888.0*     CBG: No results for input(s): GLUCAP in the last 168 hours.  Coagulation Studies:  Recent Labs  12/06/14 0224 12/07/14 0309 12/08/14 0350  LABPROT 27.5* 28.8* 25.8*  INR 2.60* 2.77* 2.39*    Other results:   Imaging: US Abdomen Limited  12/06/2014   CLINICAL DATA:  74 year old male presents for  therapeutic paracentesis for ascites.  EXAM: LIMITED ABDOMEN ULTRASOUND FOR ASCITES  TECHNIQUE: Limited ultrasound survey for ascites was performed in all four abdominal quadrants.  COMPARISON:  CT scan of the abdomen and pelvis 05/10/2014  FINDINGS: Sonographic interrogation of the 4 quadrants of the abdomen demonstrates trace ascites in the perihepatic space and right lower quadrant. The ascites is inadequate for percutaneous drainage. Therefore, paracentesis was not performed.  IMPRESSION: Trace ascites which is of too small volume for paracentesis.  Signed,  Sterling Big, MD  [facility]  [facility]   Electronically Signed   By: Malachy Moan M.D.   On: 12/06/2014 10:13    Latest Echo  12/04/14 LV EF: 30% -  35%  ------------------------------------------------------------------- Indications:   Aortic stenosis 424.1.  ------------------------------------------------------------------- History:  Risk factors: Chronic kidney disease. Dilated Cardiomyopathy. Leg swelling.  ------------------------------------------------------------------- Study Conclusions  - Left ventricle: The cavity size was normal. There was moderate concentric hypertrophy. Systolic function was moderately to severely reduced. The estimated ejection fraction was in the range of 30% to 35%. Wall motion was normal; there were no regional wall motion abnormalities. Features are consistent with a pseudonormal left ventricular filling pattern, with concomitant abnormal relaxation and increased filling pressure (grade 2 diastolic dysfunction). Doppler parameters are consistent with elevated ventricular end-diastolic filling pressure. - Ventricular septum: Septal motion showed paradox. The contour showed diastolic flattening and systolic flattening. - Aortic valve: Valve mobility was restricted. There was moderate stenosis. There was  trivial regurgitation. Valve area (VTI): 0.99 cm^2. Valve area (Vmax): 0.87 cm^2. Valve area (Vmean): 0.85 cm^2. - Aortic root: The aortic root was normal in size. - Mitral valve: There was moderate regurgitation. Valve area by pressure half-time: 2.12 cm^2. Valve area by continuity equation (using LVOT flow): 1.32 cm^2. - Left atrium: The atrium was moderately dilated. - Right ventricle: The cavity size was mildly dilated. Wall thickness was normal. Pacer wire or catheter noted in right ventricle. Systolic function was mildly reduced. - Right atrium: The atrium was moderately dilated. Pacer wire or catheter noted in right atrium. - Tricuspid valve: There was severe regurgitation. - Pulmonic valve: There was mild regurgitation. - Pulmonary arteries: Systolic pressure was severely increased. PA peak pressure: 67 mm Hg (S).  Impressions:  - LVEF is 30-35%, there is global hypokinesis with paradoxical septal motion. Grade 2 diastolic dysfunction with severely elevated filling pressures. There is systolic and diastolic septal flattening consistent with RV pressure and volume overload. RV is mildly dilated with mild systolic dysfunction. There is severe pulmonary hypertension.  Medications:     Current Medications: . carvedilol  3.125 mg Oral BID WC  . dextromethorphan-guaiFENesin  1 tablet Oral BID  . ezetimibe  10 mg Oral Daily  . feeding supplement (ENSURE ENLIVE)  237 mL Oral  BID BM  . hydrALAZINE  12.5 mg Oral TID  . isosorbide mononitrate  30 mg Oral Daily  . levothyroxine  75 mcg Oral QAC breakfast  . metolazone  5 mg Oral Daily  . potassium chloride  20 mEq Oral BID  . rosuvastatin  40 mg Oral Daily  . sodium chloride  3 mL Intravenous Q12H  . warfarin  3 mg Oral Once per day on Sun Tue Wed Thu Fri Sat  . warfarin  6 mg Oral Q Mon-1800  . Warfarin - Pharmacist Dosing Inpatient   Does not apply q1800    Infusions: . furosemide (LASIX)  infusion 20 mg/hr (12/07/14 1724)  . milrinone 0.25 mcg/kg/min (12/08/14 0108)     Assessment:   1. Acute on chronic systolic and diastolic CHF, NYHA Class IV: Ischemic cardiomyopathy. EF 30-35%.  He has declined ICD in the past.  2. Acute on CKD stage 4, GFR 15-29: monitor closely with diuresis. 3. Congested dilated cardiomyopathy 4. Moderate AS 5. CAD, hx of CABG: crestor  6. HTN: on hydralazine/Imdur 7. History of stroke: on Coumadin. 8. PAD 9. Acute on chronic respiratory failure 10. Ascites 11. Microcytic anemia  He is improved on the current regimen but remains volume overloaded, I/Os -4.0 / -14.3.  Creatinine is stable, with slight increase over past two days.  He will need ongoing diuresis.  Will continue current IV Lasix gtt and metolazone.   Continue current Coreg and hydralazine/Imdur.  No ACEI with CKD stage IV.   Once he is more euvolemic, will try to wean milrinone. If unable to wean will need RHC.    Graciella Freer, PA-C 7:36 AM  12/08/2014  Patient seen and examined with Otilio Saber, PA-C. We discussed all aspects of the encounter. I agree with the assessment and plan as stated above.   Continues to diurese. But still markedly volume overload. Will continue diurese. BUN climbing slightly so will cut lasix dose back to 15. Place TED hose. Continue milrinone. Once euvolemic will need RHC and further evaluation for TAVR. He will be a very high risk candidate. Check iron stores and uric acid. Unsure what creatinine baseline is (has been very labile) likely 1.8-2.0 at best but suspect he may be at his new baseline. Will get renal u/s.   Bensimhon, Daniel,MD 9:45 AM

## 2014-12-08 NOTE — Progress Notes (Signed)
CCMD states that pt was displaying ventricular bigeminy at 1239, for 6 sec.  Dr. Gala Romney notified.  Pt now in SR with 1st degree AV block.

## 2014-12-08 NOTE — Progress Notes (Signed)
Orthopedic Tech Progress Note Patient Details:  Clinton [name] 1941-06-02 [id number] Applied bilateral Unna boots.  Bilateral pulses, sensation, motion intact before and after application.  Bilateral capillary refill less than 2 seconds before and after application. Ortho Devices Type of Ortho Device: Radio broadcast assistant Ortho Device/Splint Location: Bilateral lower extremities Ortho Device/Splint Interventions: Application   Lesle Chris 12/08/2014, 2:52 PM

## 2014-12-09 ENCOUNTER — Ambulatory Visit (HOSPITAL_COMMUNITY): Payer: Medicare HMO

## 2014-12-09 LAB — BASIC METABOLIC PANEL
Anion gap: 13 (ref 5–15)
BUN: 84 mg/dL — AB (ref 6–20)
CHLORIDE: 90 mmol/L — AB (ref 101–111)
CO2: 30 mmol/L (ref 22–32)
Calcium: 8.5 mg/dL — ABNORMAL LOW (ref 8.9–10.3)
Creatinine, Ser: 2.72 mg/dL — ABNORMAL HIGH (ref 0.61–1.24)
GFR calc Af Amer: 25 mL/min — ABNORMAL LOW (ref >60)
GFR calc non Af Amer: 21 mL/min — ABNORMAL LOW (ref >60)
GLUCOSE: 88 mg/dL (ref 65–99)
POTASSIUM: 4.4 mmol/L (ref 3.5–5.1)
Sodium: 133 mmol/L — ABNORMAL LOW (ref 135–145)

## 2014-12-09 LAB — PROTIME-INR
INR: 2.16 — AB (ref 0.00–1.49)
Prothrombin Time: 23.9 seconds — ABNORMAL HIGH (ref 11.6–15.2)

## 2014-12-09 MED ORDER — TRAMADOL HCL 50 MG PO TABS
50.0000 mg | ORAL_TABLET | Freq: Three times a day (TID) | ORAL | Status: DC | PRN
  Administered 2014-12-15: 50 mg via ORAL
  Filled 2014-12-09: qty 1

## 2014-12-09 MED ORDER — WARFARIN SODIUM 6 MG PO TABS
6.0000 mg | ORAL_TABLET | Freq: Once | ORAL | Status: AC
  Administered 2014-12-09: 6 mg via ORAL
  Filled 2014-12-09: qty 1

## 2014-12-09 MED ORDER — SODIUM CHLORIDE 0.9 % IV SOLN
510.0000 mg | Freq: Once | INTRAVENOUS | Status: AC
  Administered 2014-12-09: 510 mg via INTRAVENOUS
  Filled 2014-12-09: qty 17

## 2014-12-09 MED ORDER — PREDNISONE 20 MG PO TABS
20.0000 mg | ORAL_TABLET | Freq: Every day | ORAL | Status: AC
  Administered 2014-12-09 – 2014-12-11 (×3): 20 mg via ORAL
  Filled 2014-12-09 (×5): qty 1

## 2014-12-09 NOTE — Progress Notes (Signed)
ANTICOAGULATION CONSULT NOTE - Follow Up Consult  Pharmacy Consult for Coumadin Indication: h/o CVA  Allergies  Allergen Reactions  . Codeine Phosphate Nausea And Vomiting    Patient Measurements: Height: 5' 11.5" (181.6 cm) Weight: 158 lb 3.2 oz (71.759 kg) IBW/kg (Calculated) : 76.45 Heparin Dosing Weight:    Vital Signs: Temp: 98.2 F (36.8 C) (06/14 0628) Temp Source: Oral (06/14 0628) BP: 102/50 mmHg (06/14 0812) Pulse Rate: 64 (06/14 0812)  Labs:  Recent Labs  12/07/14 0309 12/08/14 0350 12/08/14 1715 12/09/14 0419  HGB 10.0* 9.6*  --   --   HCT 31.6* 30.9*  --   --   PLT 160 151  --   --   LABPROT 28.8* 25.8*  --  23.9*  INR 2.77* 2.39*  --  2.16*  CREATININE 2.58* 2.64* 2.75* 2.72*    Estimated Creatinine Clearance: 24.2 mL/min (by C-G formula based on Cr of 2.72).   Assessment: CC:  CHF exacerbation- increasing LE edema, orthopnea, fatigue & chronically SOB.  Good uop on furosemide drip 15 + metolazone 5 and milrinone 0.25.  Renal fx slight bump - watch  Anticoagulation: Warfarin PTA for hx CVA (no Hx AFib).  Admit INR 2.16 dropping on home dose may because decongesting liver.    PTA dose 3 mg daily EXCEPT for 6 mg on Mondays only   Goal of Therapy:  INR 2-3 Monitor platelets by anticoagulation protocol: Yes   Plan:  Warfarin 6mg  x1 today Daily Protime  Leota Sauers Pharm.D. CPP, BCPS Clinical Pharmacist [phone] 12/09/2014 11:08 AM

## 2014-12-09 NOTE — Progress Notes (Signed)
Patient ID: FISCHER HALLEY, male   DOB: [birth year], 74 y.o.   MRN: [id number] Advanced Heart Failure Team Progress Note   Subjective   GERRIT RAFALSKI is a 74 y.o. male with a hx of CAD s/p CABG, PAD s/p Ao-bifem bypass and Ao-R renal bypass, carotid stenosis s/p bilateral CEA, LICA and L CCA stenting, HTN, HL, ICM, systolic HF, aortic stenosis, hypothyroidism, prior CVA, CKD. PAD is followed by [facility].Admitted with low output HF, worsening renal function and marked volume overload.    Milrinone started 6/10. Carvedilol decreased. Lasix increased and metolazone added. Referred for paracentesis but not enough ascites.  This morning, creatinine stable, 2.49 -> 2.58 -> 2.64 -> 2.72 today.  Weight down 5 lbs (19 pounds total).  Good urine output on current regimen.   Echo 12/04/14 - 30-35%, global hypokinesis with paradoxical septal motion. Grade 2 diastolic dysfunction. Moderate AS. Complete report below.    (similar to echo 07/10/14, EF unchanged)  Did not like TED hose but says ACE wrap feels much better.  Says he is feeling ok today.  Legs still sore.   Objective:    Vital Signs:   Temp:  [97.6 F (36.4 C)-98.6 F (37 C)] 98.2 F (36.8 C) (06/14 0628) Pulse Rate:  [70-131] 77 (06/14 0628) Resp:  [17-18] 18 (06/14 0628) BP: (87-151)/(47-95) 111/63 mmHg (06/14 0628) SpO2:  [95 %-98 %] 97 % (06/14 0628) Weight:  [158 lb 3.2 oz (71.759 kg)] 158 lb 3.2 oz (71.759 kg) (06/14 0628) Last BM Date: 12/08/14  Weight change: Filed Weights   12/07/14 0625 12/08/14 0528 12/09/14 0628  Weight: 166 lb 4.8 oz (75.433 kg) 163 lb 14.4 oz (74.345 kg) 158 lb 3.2 oz (71.759 kg)    Intake/Output:   Intake/Output Summary (Last 24 hours) at 12/09/14 0750 Last data filed at 12/09/14 0736  Gross per 24 hour  Intake 2330.15 ml  Output   4586 ml  Net -2255.85 ml     Physical Exam: General:  Elderly. Sitting in chair. NAD HEENT: normal Neck: supple. JVP 10-11 cm. Carotids 2+ bilat; no bruits. No  lymphadenopathy or thryomegaly appreciated. Cor: PMI nondisplaced. Regular rate & rhythm. 2/6 AS with moderately depressed S2, 2/6 MR Lungs: Bibasilar crackles/diminished  Abdomen: nontender.  moderately distended. Midline hernia present, easily reducible, non tender. No hepatosplenomegaly. No bruits or masses. Good bowel sounds. Extremities: no cyanosis, clubbing, rash. 2-3+ pitting edema bilateral LEs into thighs. ACE wrap lower LEs Neuro: alert & orientedx3, cranial nerves grossly intact. moves all 4 extremities w/o difficulty. Affect pleasant  Telemetry: NSR 70s-80s with PVCs. Occasional run of NSVT.  Labs: Basic Metabolic Panel:  Recent Labs Lab 12/06/14 0224 12/07/14 0309 12/08/14 0350 12/08/14 1715 12/09/14 0419  NA 135 132* 130* 132* 133*  K 4.4 4.2 4.2 4.7 4.4  CL 99* 93* 92* 89* 90*  CO2 GLUCOSE 106* 100* 116* 116* 88  BUN 71* 68* 74* 79* 84*  CREATININE 2.49* 2.58* 2.64* 2.75* 2.72*  CALCIUM 8.5* 8.5* 8.1* 8.6* 8.5*  MG 2.4  --   --  2.6*  --     Liver Function Tests:  Recent Labs Lab 12/03/14 1909  AST 18  ALT 14*  ALKPHOS 183*  BILITOT 0.9  PROT 6.5  ALBUMIN 3.2*   No results for input(s): LIPASE, AMYLASE in the last 168 hours. No results for input(s): AMMONIA in the last 168 hours.  CBC:  Recent Labs Lab 12/03/14 1909 12/07/14 0309 12/08/14 0350  WBC 8.2 7.2 7.4  NEUTROABS 6.2  --   --   HGB 11.7* 10.0* 9.6*  HCT 36.9* 31.6* 30.9*  MCV 76.2* 74.7* 74.5*  PLT 180 160 151    Cardiac Enzymes:  Recent Labs Lab 12/03/14 1909 12/04/14 0054 12/04/14 0722  TROPONINI 0.18* 0.16* 0.15*    BNP: BNP (last 3 results)  Recent Labs  12/03/14 1909  BNP >4500.0*    ProBNP (last 3 results)  Recent Labs  05/10/14 0935  PROBNP 63888.0*     CBG: No results for input(s): GLUCAP in the last 168 hours.  Coagulation Studies:  Recent Labs  12/07/14 0309 12/08/14 0350 12/09/14 0419  LABPROT 28.8* 25.8* 23.9*  INR  2.77* 2.39* 2.16*    Other results:   Imaging: US Renal  12/08/2014   CLINICAL DATA:  74 year old male with renal failure. Initial encounter.  EXAM: RENAL / URINARY TRACT ULTRASOUND COMPLETE  COMPARISON:  05/10/2014 CT.  FINDINGS: Right Kidney:  Length: 10.6 cm. Renal parenchymal thinning without hydronephrosis. There are at least 3 renal cysts 2 of which appear simple (measuring up to 3.1 cm) however, 1 of the cysts in the upper pole region appears complex measuring 1.8 x 2.1 x 2 cm.  Left Kidney:  Length: 9.7 cm. Renal parenchymal thinning without hydronephrosis. 1.6 and a 2.4 cm cysts mid to lower pole region.  Bladder:  Partially contracted with a thick wall.  IMPRESSION: Bilateral renal parenchymal thinning without hydronephrosis.  Bilateral renal cysts. Right upper pole 2.1 cm cyst is complex. As patient has renal failure, this cannot be evaluated with contrast-enhanced MR. Recommend follow-up renal sonogram in 3-6 months for further delineation (sooner if patient develops hematuria).  Thickened urinary bladder wall. This may be partially explained by partial contraction and bladder outlet changes however, limited for excluding mucosal lesion.   Electronically Signed   By: Lacy Duverney M.D.   On: 12/08/2014 15:01    Latest Echo  12/04/14 LV EF: 30% -  35%  ------------------------------------------------------------------- Indications:   Aortic stenosis 424.1.  ------------------------------------------------------------------- History:  Risk factors: Chronic kidney disease. Dilated Cardiomyopathy. Leg swelling.  ------------------------------------------------------------------- Study Conclusions  - Left ventricle: The cavity size was normal. There was moderate concentric hypertrophy. Systolic function was moderately to severely reduced. The estimated ejection fraction was in the range of 30% to 35%. Wall motion was normal; there were no regional wall motion  abnormalities. Features are consistent with a pseudonormal left ventricular filling pattern, with concomitant abnormal relaxation and increased filling pressure (grade 2 diastolic dysfunction). Doppler parameters are consistent with elevated ventricular end-diastolic filling pressure. - Ventricular septum: Septal motion showed paradox. The contour showed diastolic flattening and systolic flattening. - Aortic valve: Valve mobility was restricted. There was moderate stenosis. There was trivial regurgitation. Valve area (VTI): 0.99 cm^2. Valve area (Vmax): 0.87 cm^2. Valve area (Vmean): 0.85 cm^2. - Aortic root: The aortic root was normal in size. - Mitral valve: There was moderate regurgitation. Valve area by pressure half-time: 2.12 cm^2. Valve area by continuity equation (using LVOT flow): 1.32 cm^2. - Left atrium: The atrium was moderately dilated. - Right ventricle: The cavity size was mildly dilated. Wall thickness was normal. Pacer wire or catheter noted in right ventricle. Systolic function was mildly reduced. - Right atrium: The atrium was moderately dilated. Pacer wire or catheter noted in right atrium. - Tricuspid valve: There was severe regurgitation. - Pulmonic valve: There was mild regurgitation. - Pulmonary arteries: Systolic pressure was severely increased. PA peak pressure: 67 mm  Hg (S).  Impressions:  - LVEF is 30-35%, there is global hypokinesis with paradoxical septal motion. Grade 2 diastolic dysfunction with severely elevated filling pressures. There is systolic and diastolic septal flattening consistent with RV pressure and volume overload. RV is mildly dilated with mild systolic dysfunction. There is severe pulmonary hypertension.  Medications:     Current Medications: . carvedilol  3.125 mg Oral BID WC  . dextromethorphan-guaiFENesin  1 tablet Oral BID  . ezetimibe  10 mg Oral Daily  . feeding supplement  (ENSURE ENLIVE)  237 mL Oral BID BM  . isosorbide mononitrate  30 mg Oral Daily  . levothyroxine  75 mcg Oral QAC breakfast  . metolazone  5 mg Oral Daily  . potassium chloride  20 mEq Oral BID  . rosuvastatin  40 mg Oral Daily  . sodium chloride  3 mL Intravenous Q12H  . warfarin  3 mg Oral Once per day on Sun Tue Wed Thu Fri Sat  . warfarin  6 mg Oral Q Mon-1800  . Warfarin - Pharmacist Dosing Inpatient   Does not apply q1800    Infusions: . furosemide (LASIX) infusion 15 mg/hr (12/09/14 0636)  . milrinone 0.25 mcg/kg/min (12/08/14 1712)     Assessment:   1. Acute on chronic systolic and diastolic CHF, NYHA Class IV: Ischemic cardiomyopathy. EF 30-35%.  He has declined ICD in the past.  2. Acute on CKD stage 4, GFR 15-29: monitor closely with diuresis. 3. Congested dilated cardiomyopathy 4. Moderate AS 5. CAD, hx of CABG: crestor  6. HTN: on hydralazine/Imdur 7. History of stroke: on Coumadin. 8. PAD 9. Acute on chronic respiratory failure 10. Ascites 11. Microcytic anemia  He is improved on the current regimen but remains volume overloaded, I/Os -1.8 24 hrs / -6.2 Net. Urine Output 19L overall.  Creatinine continues to gradually increase, had decreased Lasix infusion from 20 to 15.. Will discuss with MD adjustments to diuresis.  Continue current Coreg and hydralazine/Imdur.  No ACEI with CKD stage IV.   Uric acid elevated, will treat for gout with 20 mg of prednisone x 3 days. Iron stores depleted,  In very low range of normal with increased TIBC.  Will supp with one dose of feraheme. Had several NSVT runs yesterday.  Continue to monitor K and Mg, goal 4.0 and >2.0, respectively. (K 4.4 and Mg 2.6)  May be nearing euvolemia sufficient enough for RHC and further evaluation for TAVR, likely very high risk candidate.  Graciella Freer, PA-C 7:50 AM  12/09/2014   Patient seen and examined with Otilio Saber, PA-C. We discussed all aspects of the encounter. I agree  with the assessment and plan as stated above.   Volume status continues to improve. Creatinine up only slightly. Still with at least 10-15 pounds of fluid on board. Continue diuresis. Will check renal artery u/s to assess patency of renal artery bypass grafts. Agree with Feraheme and treatment for gout.   Once volume status improved will need to reassess candidacy for AVR. He will be very marginal candidate at best, particularly if renal function does not improve significantly. D/w patient and family at bedside.   Bensimhon, Daniel,MD 2:48 PM

## 2014-12-10 ENCOUNTER — Inpatient Hospital Stay (HOSPITAL_COMMUNITY): Payer: Medicare HMO

## 2014-12-10 DIAGNOSIS — N19 Unspecified kidney failure: Secondary | ICD-10-CM

## 2014-12-10 LAB — BASIC METABOLIC PANEL
ANION GAP: 13 (ref 5–15)
BUN: 85 mg/dL — ABNORMAL HIGH (ref 6–20)
CO2: 31 mmol/L (ref 22–32)
Calcium: 8.7 mg/dL — ABNORMAL LOW (ref 8.9–10.3)
Chloride: 87 mmol/L — ABNORMAL LOW (ref 101–111)
Creatinine, Ser: 2.76 mg/dL — ABNORMAL HIGH (ref 0.61–1.24)
GFR calc non Af Amer: 21 mL/min — ABNORMAL LOW (ref >60)
GFR, EST AFRICAN AMERICAN: 24 mL/min — AB (ref >60)
Glucose, Bld: 113 mg/dL — ABNORMAL HIGH (ref 65–99)
POTASSIUM: 4.6 mmol/L (ref 3.5–5.1)
SODIUM: 131 mmol/L — AB (ref 135–145)

## 2014-12-10 LAB — CBC
HCT: 35 % — ABNORMAL LOW (ref 39.0–52.0)
HEMOGLOBIN: 10.8 g/dL — AB (ref 13.0–17.0)
MCH: 23.6 pg — ABNORMAL LOW (ref 26.0–34.0)
MCHC: 30.9 g/dL (ref 30.0–36.0)
MCV: 76.6 fL — ABNORMAL LOW (ref 78.0–100.0)
PLATELETS: 173 10*3/uL (ref 150–400)
RBC: 4.57 MIL/uL (ref 4.22–5.81)
RDW: 19.2 % — AB (ref 11.5–15.5)
WBC: 11.4 10*3/uL — ABNORMAL HIGH (ref 4.0–10.5)

## 2014-12-10 LAB — PROTIME-INR
INR: 2.21 — ABNORMAL HIGH (ref 0.00–1.49)
Prothrombin Time: 24.3 seconds — ABNORMAL HIGH (ref 11.6–15.2)

## 2014-12-10 MED ORDER — WARFARIN SODIUM 3 MG PO TABS
3.0000 mg | ORAL_TABLET | Freq: Once | ORAL | Status: AC
  Administered 2014-12-10: 3 mg via ORAL
  Filled 2014-12-10: qty 1

## 2014-12-10 NOTE — Progress Notes (Addendum)
Preliminary results by tech - Renal Duplex Completed. Right renal artery graft is patent with increased velocities suggestive of 1-59% diameter reduction. Left renal artery appeared occluded. Technically difficult to obtain Doppler flow.   Marilynne Halsted, BS, RDMS, RVT

## 2014-12-10 NOTE — Progress Notes (Signed)
ANTICOAGULATION CONSULT NOTE - Follow Up Consult  Pharmacy Consult for Coumadin Indication: h/o CVA  Allergies  Allergen Reactions  . Codeine Phosphate Nausea And Vomiting    Patient Measurements: Height: 5' 11.5" (181.6 cm) Weight: 155 lb 8 oz (70.534 kg) (Scale A) IBW/kg (Calculated) : 76.45 Heparin Dosing Weight:    Vital Signs: Temp: 97.7 F (36.5 C) (06/15 0526) Temp Source: Oral (06/15 0526) BP: 110/60 mmHg (06/15 0526) Pulse Rate: 76 (06/15 0526)  Labs:  Recent Labs  12/08/14 0350 12/08/14 1715 12/09/14 0419 12/10/14 0242  HGB 9.6*  --   --   --   HCT 30.9*  --   --   --   PLT 151  --   --   --   LABPROT 25.8*  --  23.9* 24.3*  INR 2.39*  --  2.16* 2.21*  CREATININE 2.64* 2.75* 2.72* 2.76*    Estimated Creatinine Clearance: 23.4 mL/min (by C-G formula based on Cr of 2.76).   Assessment: CC:  CHF exacerbation- increasing LE edema, orthopnea, fatigue & chronically SOB.  Good uop on furosemide drip 15 + metolazone 5 and milrinone 0.25.  Renal fx slight bump - watch  Anticoagulation: Warfarin PTA for hx CVA (no Hx AFib).  INR 2.21; dropping on home dose may because decongesting liver.   No bleeding or complications noted. PTA dose 3 mg daily EXCEPT for 6 mg on Mondays only   Goal of Therapy:  INR 2-3 Monitor platelets by anticoagulation protocol: Yes   Plan:  Warfarin 3 mg x1 today- may need to add 6 mg another day or two per week. Daily PT/INR.  Tad Moore, BCPS  Clinical Pharmacist Pager [phone]  12/10/2014 8:56 AM

## 2014-12-10 NOTE — Progress Notes (Signed)
Patient ID: Clinton [name], male   DOB: January 07, 1941, 74 y.o.   MRN: [id number] Advanced Heart Failure Team Progress Note   Subjective   Clinton [name] is a 74 y.o. male with a hx of CAD s/p CABG, PAD s/p Ao-bifem bypass and Ao-R renal bypass, carotid stenosis s/p bilateral CEA, LICA and L CCA stenting, HTN, HL, ICM, systolic HF, aortic stenosis, hypothyroidism, prior CVA, CKD. PAD is followed by [facility].Admitted with low output HF, worsening renal function and marked volume overload.    Milrinone started 6/10. Carvedilol decreased. Lasix increased and metolazone added. Referred for paracentesis but not enough ascites.  This morning, creatinine stable, 2.58 -> 2.64 -> 2.72 -> 2.76 today.  Weight down 3 lbs (22 pounds total).  Good urine output on current regimen.   Echo 12/04/14 - 30-35%, global hypokinesis with paradoxical septal motion. Grade 2 diastolic dysfunction. Moderate AS. Complete report below.    (similar to echo 07/10/14, EF unchanged)  Feeling OK today.  No new complaints.  Ready to eat after being made NPO for vascular US this am.  "peed all night"   Objective:    Vital Signs:   Temp:  [97.6 F (36.4 C)-98 F (36.7 C)] 97.7 F (36.5 C) (06/15 0526) Pulse Rate:  [41-87] 76 (06/15 0526) Resp:  [16-18] 18 (06/15 0526) BP: (95-134)/(53-75) 110/60 mmHg (06/15 0526) SpO2:  [94 %-96 %] 96 % (06/15 0526) Weight:  [155 lb 8 oz (70.534 kg)] 155 lb 8 oz (70.534 kg) (06/15 0526) Last BM Date: 12/09/14  Weight change: Filed Weights   12/08/14 0528 12/09/14 0628 12/10/14 0526  Weight: 163 lb 14.4 oz (74.345 kg) 158 lb 3.2 oz (71.759 kg) 155 lb 8 oz (70.534 kg)    Intake/Output:   Intake/Output Summary (Last 24 hours) at 12/10/14 0855 Last data filed at 12/10/14 0744  Gross per 24 hour  Intake 2844.6 ml  Output   5145 ml  Net -2300.4 ml     Physical Exam: General:  Elderly. Sitting in chair. NAD HEENT: normal Neck: supple. JVP 9-10 cm. Carotids 2+ bilat; no bruits. No  lymphadenopathy or thryomegaly appreciated. Cor: PMI nondisplaced. Regular rate & rhythm. 2/6 AS with moderately depressed S2, 2/6 MR Lungs: Bibasilar crackles/diminished  Abdomen: nontender.  moderately distended. Midline hernia present, easily reducible, non tender. No hepatosplenomegaly. No bruits or masses. Good bowel sounds. Extremities: no cyanosis, clubbing, rash. 1-2+ pitting edema bilateral LEs to knees. ACE wrap lower LEs Neuro: alert & orientedx3, cranial nerves grossly intact. moves all 4 extremities w/o difficulty. Affect pleasant  Telemetry: NSR 70s-80s with PVCs. Occasional run of NSVT.  Labs: Basic Metabolic Panel:  Recent Labs Lab 12/06/14 0224 12/07/14 0309 12/08/14 0350 12/08/14 1715 12/09/14 0419 12/10/14 0242  NA 135 132* 130* 132* 133* 131*  K 4.4 4.2 4.2 4.7 4.4 4.6  CL 99* 93* 92* 89* 90* 87*  CO2 GLUCOSE 106* 100* 116* 116* 88 113*  BUN 71* 68* 74* 79* 84* 85*  CREATININE 2.49* 2.58* 2.64* 2.75* 2.72* 2.76*  CALCIUM 8.5* 8.5* 8.1* 8.6* 8.5* 8.7*  MG 2.4  --   --  2.6*  --   --     Liver Function Tests:  Recent Labs Lab 12/03/14 1909  AST 18  ALT 14*  ALKPHOS 183*  BILITOT 0.9  PROT 6.5  ALBUMIN 3.2*   No results for input(s): LIPASE, AMYLASE in the last 168 hours. No results for input(s): AMMONIA in the last  168 hours.  CBC:  Recent Labs Lab 12/03/14 1909 12/07/14 0309 12/08/14 0350  WBC 8.2 7.2 7.4  NEUTROABS 6.2  --   --   HGB 11.7* 10.0* 9.6*  HCT 36.9* 31.6* 30.9*  MCV 76.2* 74.7* 74.5*  PLT 180 160 151    Cardiac Enzymes:  Recent Labs Lab 12/03/14 1909 12/04/14 0054 12/04/14 0722  TROPONINI 0.18* 0.16* 0.15*    BNP: BNP (last 3 results)  Recent Labs  12/03/14 1909  BNP >4500.0*    ProBNP (last 3 results)  Recent Labs  05/10/14 0935  PROBNP 63888.0*     CBG: No results for input(s): GLUCAP in the last 168 hours.  Coagulation Studies:  Recent Labs  12/08/14 0350  12/09/14 0419 12/10/14 0242  LABPROT 25.8* 23.9* 24.3*  INR 2.39* 2.16* 2.21*    Other results:   Imaging: US Renal  12/08/2014   CLINICAL DATA:  74 year old male with renal failure. Initial encounter.  EXAM: RENAL / URINARY TRACT ULTRASOUND COMPLETE  COMPARISON:  05/10/2014 CT.  FINDINGS: Right Kidney:  Length: 10.6 cm. Renal parenchymal thinning without hydronephrosis. There are at least 3 renal cysts 2 of which appear simple (measuring up to 3.1 cm) however, 1 of the cysts in the upper pole region appears complex measuring 1.8 x 2.1 x 2 cm.  Left Kidney:  Length: 9.7 cm. Renal parenchymal thinning without hydronephrosis. 1.6 and a 2.4 cm cysts mid to lower pole region.  Bladder:  Partially contracted with a thick wall.  IMPRESSION: Bilateral renal parenchymal thinning without hydronephrosis.  Bilateral renal cysts. Right upper pole 2.1 cm cyst is complex. As patient has renal failure, this cannot be evaluated with contrast-enhanced MR. Recommend follow-up renal sonogram in 3-6 months for further delineation (sooner if patient develops hematuria).  Thickened urinary bladder wall. This may be partially explained by partial contraction and bladder outlet changes however, limited for excluding mucosal lesion.   Electronically Signed   By: Lacy Duverney M.D.   On: 12/08/2014 15:01    Latest Echo  12/04/14 LV EF: 30% -  35%  ------------------------------------------------------------------- Indications:   Aortic stenosis 424.1.  ------------------------------------------------------------------- History:  Risk factors: Chronic kidney disease. Dilated Cardiomyopathy. Leg swelling.  ------------------------------------------------------------------- Study Conclusions  - Left ventricle: The cavity size was normal. There was moderate concentric hypertrophy. Systolic function was moderately to severely reduced. The estimated ejection fraction was in the range of 30% to 35%. Wall  motion was normal; there were no regional wall motion abnormalities. Features are consistent with a pseudonormal left ventricular filling pattern, with concomitant abnormal relaxation and increased filling pressure (grade 2 diastolic dysfunction). Doppler parameters are consistent with elevated ventricular end-diastolic filling pressure. - Ventricular septum: Septal motion showed paradox. The contour showed diastolic flattening and systolic flattening. - Aortic valve: Valve mobility was restricted. There was moderate stenosis. There was trivial regurgitation. Valve area (VTI): 0.99 cm^2. Valve area (Vmax): 0.87 cm^2. Valve area (Vmean): 0.85 cm^2. - Aortic root: The aortic root was normal in size. - Mitral valve: There was moderate regurgitation. Valve area by pressure half-time: 2.12 cm^2. Valve area by continuity equation (using LVOT flow): 1.32 cm^2. - Left atrium: The atrium was moderately dilated. - Right ventricle: The cavity size was mildly dilated. Wall thickness was normal. Pacer wire or catheter noted in right ventricle. Systolic function was mildly reduced. - Right atrium: The atrium was moderately dilated. Pacer wire or catheter noted in right atrium. - Tricuspid valve: There was severe regurgitation. - Pulmonic valve: There was  mild regurgitation. - Pulmonary arteries: Systolic pressure was severely increased. PA peak pressure: 67 mm Hg (S).  Impressions:  - LVEF is 30-35%, there is global hypokinesis with paradoxical septal motion. Grade 2 diastolic dysfunction with severely elevated filling pressures. There is systolic and diastolic septal flattening consistent with RV pressure and volume overload. RV is mildly dilated with mild systolic dysfunction. There is severe pulmonary hypertension.  Medications:     Current Medications: . carvedilol  3.125 mg Oral BID WC  . dextromethorphan-guaiFENesin  1 tablet Oral BID  .  ezetimibe  10 mg Oral Daily  . feeding supplement (ENSURE ENLIVE)  237 mL Oral BID BM  . isosorbide mononitrate  30 mg Oral Daily  . levothyroxine  75 mcg Oral QAC breakfast  . metolazone  5 mg Oral Daily  . potassium chloride  20 mEq Oral BID  . predniSONE  20 mg Oral Q breakfast  . rosuvastatin  40 mg Oral Daily  . sodium chloride  3 mL Intravenous Q12H  . Warfarin - Pharmacist Dosing Inpatient   Does not apply q1800    Infusions: . furosemide (LASIX) infusion 15 mg/hr (12/10/14 0013)  . milrinone 0.25 mcg/kg/min (12/10/14 0215)     Assessment:   1. Acute on chronic systolic and diastolic CHF, NYHA Class IV: Ischemic cardiomyopathy. EF 30-35%.  He has declined ICD in the past.  2. Acute on CKD stage 4, GFR 15-29: monitor closely with diuresis. 3. Congested dilated cardiomyopathy 4. Moderate AS 5. CAD, hx of CABG: crestor 40mg  6. HTN: on hydralazine/Imdur 7. History of stroke: on Coumadin per pharm.  Consult 6/14 8. PAD 9. Acute on chronic respiratory failure 10. Ascites 11. Microcytic anemia  He is improved on the current regimen but remains volume overloaded, I/Os -1.8 24hrs / -8.7 Net. Creatinine slight increase but more stable than previous days on continued lasix infusion. LEs much improved with diureses and wrapping. Will discuss with MD adjustments to diuresis.  Continue current Coreg and hydralazine/Imdur.  No ACEI with CKD stage IV.   Uric acid elevated, Day 2/3 of 20 mg of prednisone Iron stores low,received one dose of feraheme. Repeat CBC today. Had several NSVT runs yesterday.  Continue to monitor K and Mg, goal 4.0 and >2.0, respectively. (K 4.4 and Mg 2.6)  May be nearing euvolemia sufficient enough for RHC and further evaluation for TAVR, likely high risk candidate.  Vascular US to assess renal artery bypass grafts, results pending, but preliminary results show there may be some occlusion.  Graciella Freer, PA-C 8:55 AM  12/10/2014    Patient  seen and examined with Otilio Saber, PA-C. We discussed all aspects of the encounter. I agree with the assessment and plan as stated above.   Volume status continues to improve. Getting close to euvolemia. Renal function stable. At least one more day of diuresis. R renal aretery bypass graft is patent. 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 Probable RHC on Friday. Will be very high risk for AVR. Will d/w Dr. Excell Seltzer.   Bensimhon, Daniel,MD 3:36 PM

## 2014-12-11 LAB — BASIC METABOLIC PANEL
Anion gap: 16 — ABNORMAL HIGH (ref 5–15)
BUN: 91 mg/dL — ABNORMAL HIGH (ref 6–20)
CALCIUM: 8.7 mg/dL — AB (ref 8.9–10.3)
CO2: 31 mmol/L (ref 22–32)
CREATININE: 2.79 mg/dL — AB (ref 0.61–1.24)
Chloride: 84 mmol/L — ABNORMAL LOW (ref 101–111)
GFR calc Af Amer: 24 mL/min — ABNORMAL LOW (ref >60)
GFR calc non Af Amer: 21 mL/min — ABNORMAL LOW (ref >60)
Glucose, Bld: 173 mg/dL — ABNORMAL HIGH (ref 65–99)
Potassium: 4.1 mmol/L (ref 3.5–5.1)
Sodium: 131 mmol/L — ABNORMAL LOW (ref 135–145)

## 2014-12-11 LAB — PROTIME-INR
INR: 2.66 — AB (ref 0.00–1.49)
Prothrombin Time: 28 seconds — ABNORMAL HIGH (ref 11.6–15.2)

## 2014-12-11 MED ORDER — WARFARIN SODIUM 3 MG PO TABS
3.0000 mg | ORAL_TABLET | Freq: Once | ORAL | Status: AC
  Administered 2014-12-11: 3 mg via ORAL
  Filled 2014-12-11: qty 1

## 2014-12-11 NOTE — Progress Notes (Signed)
Patient ID: Clinton [name], male   DOB: 1941-01-09, 74 y.o.   MRN: [id number] Advanced Heart Failure Team Progress Note   Subjective   Clinton [name] is a 74 y.o. male with a hx of CAD s/p CABG, PAD s/p Ao-bifem bypass and Ao-R renal bypass, carotid stenosis s/p bilateral CEA, LICA and L CCA stenting, HTN, HL, ICM, systolic HF, aortic stenosis, hypothyroidism, prior CVA, CKD. PAD is followed by [facility].Admitted with low output HF, worsening renal function and marked volume overload.    Milrinone started 6/10. On Carvedilol. On lasix infusion and metolazone. Referred for paracentesis but not enough ascites.  Creatinine stable, 2.58 -> 2.64 -> 2.72 -> 2.76 -> 2.79 today.  Weight down 4 lbs (26 pounds total).  Good urine output on current regimen.   Echo 12/04/14 - 30-35%, global hypokinesis with paradoxical septal motion. Grade 2 diastolic dysfunction. Moderate AS. Complete report below.    (similar to echo 07/10/14, EF unchanged)  Feeling good today.  Still peed all night.  Son in room, has several questions about where we will go from here.  He felt questions were adequately answered concerning RHC and evaluation for TAVR, all though he is a very high risk candidate at best.   Objective:    Vital Signs:   Temp:  [97.4 F (36.3 C)-98.1 F (36.7 C)] 97.4 F (36.3 C) (06/16 0414) Pulse Rate:  [75-97] 82 (06/16 0414) Resp:  [18] 18 (06/16 0414) BP: (97-132)/(53-75) 97/59 mmHg (06/16 0414) SpO2:  [92 %-96 %] 96 % (06/16 0414) Weight:  [151 lb 11.2 oz (68.811 kg)] 151 lb 11.2 oz (68.811 kg) (06/16 0414) Last BM Date: 12/10/14  Weight change: Filed Weights   12/09/14 0628 12/10/14 0526 12/11/14 0414  Weight: 158 lb 3.2 oz (71.759 kg) 155 lb 8 oz (70.534 kg) 151 lb 11.2 oz (68.811 kg)    Intake/Output:   Intake/Output Summary (Last 24 hours) at 12/11/14 0718 Last data filed at 12/11/14 0600  Gross per 24 hour  Intake 1803.6 ml  Output   3725 ml  Net -1921.4 ml     Physical  Exam: General:  Elderly. Sitting in chair eating breakfast. NAD. Son in room. HEENT: normal Neck: supple. JVP 8-9 cm. Carotids 2+ bilat; bilateral bruits, hx of CEA. No lymphadenopathy or thryomegaly appreciated. Cor: PMI nondisplaced. Regular rate & rhythm. 2/6 AS with moderately depressed S2, 2/6 MR Lungs: Bibasilar diminished  Abdomen: nontender.  moderately distended. Midline hernia present, easily reducible, non tender. No hepatosplenomegaly. No bruits or masses. Good bowel sounds. Extremities: no cyanosis, clubbing, rash. 1+ pitting edema bilateral LEs to knees. ACE wrap lower LEs Neuro: alert & orientedx3, cranial nerves grossly intact. moves all 4 extremities w/o difficulty. Affect pleasant  Telemetry: NSR 70s-80s with PVCs.   Labs: Basic Metabolic Panel:  Recent Labs Lab 12/06/14 0224 12/07/14 0309 12/08/14 0350 12/08/14 1715 12/09/14 0419 12/10/14 0242  NA 135 132* 130* 132* 133* 131*  K 4.4 4.2 4.2 4.7 4.4 4.6  CL 99* 93* 92* 89* 90* 87*  CO2 24 25 26 28 30 31   GLUCOSE 106* 100* 116* 116* 88 113*  BUN 71* 68* 74* 79* 84* 85*  CREATININE 2.49* 2.58* 2.64* 2.75* 2.72* 2.76*  CALCIUM 8.5* 8.5* 8.1* 8.6* 8.5* 8.7*  MG 2.4  --   --  2.6*  --   --     Liver Function Tests: No results for input(s): AST, ALT, ALKPHOS, BILITOT, PROT, ALBUMIN in the last 168 hours. No results for  input(s): LIPASE, AMYLASE in the last 168 hours. No results for input(s): AMMONIA in the last 168 hours.  CBC:  Recent Labs Lab 12/07/14 0309 12/08/14 0350 12/10/14 1155  WBC 7.2 7.4 11.4*  HGB 10.0* 9.6* 10.8*  HCT 31.6* 30.9* 35.0*  MCV 74.7* 74.5* 76.6*  PLT 160 151 173    Cardiac Enzymes:  Recent Labs Lab 12/04/14 0722  TROPONINI 0.15*    BNP: BNP (last 3 results)  Recent Labs  12/03/14 1909  BNP >4500.0*    ProBNP (last 3 results)  Recent Labs  05/10/14 0935  PROBNP 63888.0*     CBG: No results for input(s): GLUCAP in the last 168 hours.  Coagulation  Studies:  Recent Labs  12/09/14 0419 12/10/14 0242 12/11/14 0527  LABPROT 23.9* 24.3* 28.0*  INR 2.16* 2.21* 2.66*    Other results:   Imaging: No results found.  Latest Echo  12/04/14 LV EF: 30% -  35%  ------------------------------------------------------------------- Indications:   Aortic stenosis 424.1.  ------------------------------------------------------------------- History:  Risk factors: Chronic kidney disease. Dilated Cardiomyopathy. Leg swelling.  ------------------------------------------------------------------- Study Conclusions  - Left ventricle: The cavity size was normal. There was moderate concentric hypertrophy. Systolic function was moderately to severely reduced. The estimated ejection fraction was in the range of 30% to 35%. Wall motion was normal; there were no regional wall motion abnormalities. Features are consistent with a pseudonormal left ventricular filling pattern, with concomitant abnormal relaxation and increased filling pressure (grade 2 diastolic dysfunction). Doppler parameters are consistent with elevated ventricular end-diastolic filling pressure. - Ventricular septum: Septal motion showed paradox. The contour showed diastolic flattening and systolic flattening. - Aortic valve: Valve mobility was restricted. There was moderate stenosis. There was trivial regurgitation. Valve area (VTI): 0.99 cm^2. Valve area (Vmax): 0.87 cm^2. Valve area (Vmean): 0.85 cm^2. - Aortic root: The aortic root was normal in size. - Mitral valve: There was moderate regurgitation. Valve area by pressure half-time: 2.12 cm^2. Valve area by continuity equation (using LVOT flow): 1.32 cm^2. - Left atrium: The atrium was moderately dilated. - Right ventricle: The cavity size was mildly dilated. Wall thickness was normal. Pacer wire or catheter noted in right ventricle. Systolic function was mildly reduced. - Right  atrium: The atrium was moderately dilated. Pacer wire or catheter noted in right atrium. - Tricuspid valve: There was severe regurgitation. - Pulmonic valve: There was mild regurgitation. - Pulmonary arteries: Systolic pressure was severely increased. PA peak pressure: 67 mm Hg (S).  Impressions:  - LVEF is 30-35%, there is global hypokinesis with paradoxical septal motion. Grade 2 diastolic dysfunction with severely elevated filling pressures. There is systolic and diastolic septal flattening consistent with RV pressure and volume overload. RV is mildly dilated with mild systolic dysfunction. There is severe pulmonary hypertension.  Medications:     Current Medications: . carvedilol  3.125 mg Oral BID WC  . dextromethorphan-guaiFENesin  1 tablet Oral BID  . ezetimibe  10 mg Oral Daily  . feeding supplement (ENSURE ENLIVE)  237 mL Oral BID BM  . isosorbide mononitrate  30 mg Oral Daily  . levothyroxine  75 mcg Oral QAC breakfast  . metolazone  5 mg Oral Daily  . potassium chloride  20 mEq Oral BID  . predniSONE  20 mg Oral Q breakfast  . rosuvastatin  40 mg Oral Daily  . sodium chloride  3 mL Intravenous Q12H  . Warfarin - Pharmacist Dosing Inpatient   Does not apply q1800    Infusions: . furosemide (LASIX) infusion  15 mg/hr (12/10/14 1845)  . milrinone 0.25 mcg/kg/min (12/10/14 1845)     Assessment:   1. Acute on chronic systolic and diastolic CHF, NYHA Class IV: Ischemic cardiomyopathy. EF 30-35%.  He has declined ICD in the past.  2. Acute on CKD stage 4, GFR 15-29: monitor closely with diuresis. 3. Congested dilated cardiomyopathy 4. Moderate AS 5. CAD, hx of CABG: crestor  6. HTN: on hydralazine/Imdur 7. History of stroke: on Coumadin per pharm.  Consult 6/14 8. PAD 9. Acute on chronic respiratory failure 10. Ascites 11. Microcytic anemia  He is improved on the current regimen but remains volume overloaded, I/Os -1.9 24hrs / -8.7 Net.  Creatinine 2.79 Continue current Coreg and hydralazine/Imdur.  No ACEI with CKD stage IV.   Uric acid elevated, Day 3/3 of 20 mg of prednisone Iron stores low,received one dose of feraheme. Improved. Had several NSVT runs yesterday.  Continue to monitor K and Mg, goal 4.0 and >2.0, respectively. (K 4.6 and Mg 2.6)  Nearing euvolemia sufficient.  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   May be ok to back off of diuresis, but still volume overloaded to moderate degree. Will consult MD for optimal timing of diuresis in regards to LHC/RHC.  Graciella Freer, PA-C 7:18 AM  12/11/2014    Patient seen and examined with Otilio Saber, PA-C. We discussed all aspects of the encounter. I agree with the assessment and plan as stated above.   Volume status continues to improve. Renal function relatively stable. Long talk with him and his son about high-risk nature of AVR either with TAVR or traditional approach. Renal failure and PAD significant obstacles and I told them he would be at least 50% risk of ESRD with proceeding with invasvie procedures. May be worth tiral of medical therpay for a while and see how he does. Dr. Excell Seltzer will see today to help risk stratify.   Kaelynn Igo,MD 11:13 AM

## 2014-12-11 NOTE — Progress Notes (Signed)
ANTICOAGULATION CONSULT NOTE - Follow Up Consult  Pharmacy Consult for Coumadin Indication: h/o CVA  Allergies  Allergen Reactions  . Codeine Phosphate Nausea And Vomiting    Patient Measurements: Height: 5' 11.5" (181.6 cm) Weight: 151 lb 11.2 oz (68.811 kg) (scale a) IBW/kg (Calculated) : 76.45 Heparin Dosing Weight:    Vital Signs: Temp: 97.4 F (36.3 C) (06/16 0414) Temp Source: Oral (06/16 0414) BP: 97/59 mmHg (06/16 0414) Pulse Rate: 82 (06/16 0414)  Labs:  Recent Labs  12/09/14 0419 12/10/14 0242 12/10/14 1155 12/11/14 0527  HGB  --   --  10.8*  --   HCT  --   --  35.0*  --   PLT  --   --  173  --   LABPROT 23.9* 24.3*  --  28.0*  INR 2.16* 2.21*  --  2.66*  CREATININE 2.72* 2.76*  --  2.79*    Estimated Creatinine Clearance: 22.6 mL/min (by C-G formula based on Cr of 2.79).   Assessment: CC:  CHF exacerbation- increasing LE edema, orthopnea, fatigue & chronically SOB.  Good uop on furosemide drip 15 + metolazone 5 and milrinone 0.25.  Renal fx slight bump - watch  Anticoagulation: Warfarin PTA for hx CVA (no Hx AFib).  INR 2.66.  No bleeding or complications noted. PTA dose 3 mg daily EXCEPT for 6 mg on Mondays only   Goal of Therapy:  INR 2-3 Monitor platelets by anticoagulation protocol: Yes   Plan:  Warfarin 3 mg x1 today Daily PT/INR.  Tad Moore, BCPS  Clinical Pharmacist Pager [phone]  12/11/2014 7:25 AM

## 2014-12-11 NOTE — Consult Note (Signed)
CARDIOLOGY CONSULT NOTE  Patient ID: Clinton [name], MRN: [id number], DOB/AGE: 1940/10/07 74 y.o. Admit date: 12/03/2014 Date of Consult: 12/11/2014  Primary Physician: No PCP Per Patient Primary Cardiologist: Dr Jens Som Referring Physician: Dr Gala Romney  Chief Complaint: shortness of breath/weakness Reason for Consultation: Severe aortic stenosis  HPI: 74 year old gentleman hospitalized now for approximately 8 days with congestive heart failure. He has an extensive cardiac history which includes remote CABG, aortobifemoral bypass and aorto right renal bypass, carotid stenosis with bilateral carotid endarterectomy, carotid stenting, prior stroke, and chronic kidney disease. He presented on June 8 to the office with progressive shortness of breath, orthopnea, and leg swelling. He was admitted for inpatient treatment of heart failure. He initially did not respond to typical therapy with IV diuresis. The advanced heart failure team was consulted and he was started on IV milrinone. He has subsequently had a dramatic diuresis with clinical improvement. His breathing is much better. Orthopnea and leg swelling have resolved. He continues to feel weak.  The patient has been noted to have a severely calcified aortic valve with restricted mobility. His echo Doppler parameters are suggestive of moderate aortic stenosis, but there has been a question of severe low gradient aortic stenosis. I am asked to evaluate him for consideration of aortic valve therapies (TAVR).  Medical History:  Past Medical History  Diagnosis Date  . CAD (coronary artery disease)     a. CABG 1985;  b. 05/2002 Rotablator/PTCA to ostial LCX and RI;  c. 12/2009 MV: inf and lat infarct w/ minimal mid lateral and basilar ant ischemia, EF 29%->Med Rx.  . Cardiomyopathy, ischemic     a. 06/2010 Cardiac MRI: EF 30%, anterolat, post, inf prior subendocardial infarcts;  b. 08/2012 Echo: EF 30-35%, mild LVH mult wma's, Gr 2 DD;  c. 05/2013  Echo: EF 20-25%, diff HK, mod AS, mild MR, mod dil LA, mild to mod TR, PASP .  . Stroke     remote  . Hypertension   . Hyperlipidemia   . Tobacco abuse   . Anticoagulated on Coumadin   . Chronic systolic CHF (congestive heart failure)     a. 05/2013 EF 20-25%, diff HK.  . Moderate aortic stenosis     a. 05/2013 Echo: at least moderate AS (Valve area 1.07 cm2 - VTI; 1.08cm2 - Vmax).  . Hypothyroidism     a. 05/2013 TSH 50.15 - synthroid 50 mcg daily started.  . Peripheral arterial disease     a. Ao-bifem bypass w/ 16x8 hemashield dacron graft, R aortorenal bypass w/ 6mm dacron graft;  b. 09/2011 ABI: R 0/87, L 0.92.  . Carotid artery occlusion     a. 12/2002 R CEA;  b. 05/2007 L Carotid stenting;  c. 09/2008 repeat L carotid stenting 2/2 ISR;  d. 09/2011 Carotid U/S: RICA 40-59%, LICA 60-79%, >50 dist LCCA stenosis.  . CKD (chronic kidney disease), stage III   . Myocardial infarction 12/2009    MV: inf and lat infarct w/ minimal mid lateral and basilar ant ischemia, EF 29%->Med Rx  . Myocardial infarction 1985    "that's why I had the bypass"  . COPD (chronic obstructive pulmonary disease)   . Pneumonia 05/2014  . Arthritis     "hands" (12/03/2014)  . 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 Carotid endarterectomy Right 12/2002    hx/notes 12/03/2014  .  Pr vein bypass graft,aorto-fem-pop    . Aorta - bilateral femoral artery bypass graft      hx/notes 12/03/2014  . Aortic/renal bypass      hx/notes 12/03/2014  . Carotid stent Left 05/2007; 09/2008    Hattie Perch 12/03/2014  . Coronary angioplasty with stent placement  05/2002    Rotablator/PTCA to ostial LCX and RI  . Inguinal hernia repair Right   . Back surgery    . Lumbar disc surgery  X 3?  Marland Kitchen Coronary artery bypass graft  1985    "CABG X5"     Home Meds: Prior to Admission medications   Medication Sig Start Date End Date Taking?  Authorizing Provider  carvedilol (COREG) 12.5 MG tablet take 1 and 1/2 tablets by mouth twice a day 11/04/14  Yes Lewayne Bunting, MD  ezetimibe (ZETIA) 10 MG tablet Take 10 mg by mouth at bedtime.    Yes Historical Provider, MD  feeding supplement, ENSURE COMPLETE, (ENSURE COMPLETE) LIQD Take 237 mLs by mouth 2 (two) times daily between meals. Patient taking differently: Take 237 mLs by mouth daily.  05/24/14  Yes Starleen Arms, MD  furosemide (LASIX) 40 MG tablet Take 1 tablet (40 mg total) by mouth daily. 06/11/14  Yes Dwana Melena, PA-C  hydrALAZINE (APRESOLINE) 50 MG tablet Take 1 tablet (50 mg total) by mouth 3 (three) times daily. 11/27/14  Yes Lewayne Bunting, MD  levothyroxine (SYNTHROID, LEVOTHROID) 75 MCG tablet Take 1 tablet (75 mcg total) by mouth daily before breakfast. 07/11/14  Yes Lewayne Bunting, MD  potassium chloride (K-DUR) 10 MEQ tablet Take 1 tablet (10 mEq total) by mouth daily. Patient taking differently: Take 10 mEq by mouth at bedtime.  06/11/14  Yes Dwana Melena, PA-C  rosuvastatin (CRESTOR) 40 MG tablet Take 1 tablet (40 mg total) by mouth daily. Patient taking differently: Take 40 mg by mouth at bedtime.  06/19/13  Yes Rosalio Macadamia, NP  Umeclidinium Bromide (INCRUSE ELLIPTA) 62.5 MCG/INH AEPB Inhale 1 puff into the lungs daily.   Yes Historical Provider, MD  warfarin (COUMADIN) 6 MG tablet take as directed BY COUMADIN CLINIC Patient taking differently: 6 mg on Monday.  3 mg on all other days. 11/27/14  Yes Lewayne Bunting, MD  isosorbide mononitrate (IMDUR) 30 MG 24 hr tablet take 1 tablet by mouth once daily 12/04/14   Lewayne Bunting, MD    Inpatient Medications:  . carvedilol  3.125 mg Oral BID WC  . dextromethorphan-guaiFENesin  1 tablet Oral BID  . ezetimibe  10 mg Oral Daily  . feeding supplement (ENSURE ENLIVE)  237 mL Oral BID BM  . isosorbide mononitrate  30 mg Oral Daily  . levothyroxine  75 mcg Oral QAC breakfast  . metolazone  5 mg Oral Daily    . potassium chloride  20 mEq Oral BID  . predniSONE  20 mg Oral Q breakfast  . rosuvastatin  40 mg Oral Daily  . sodium chloride  3 mL Intravenous Q12H  . warfarin  3 mg Oral ONCE-1800  . Warfarin - Pharmacist Dosing Inpatient   Does not apply q1800   . furosemide (LASIX) infusion 15 mg/hr (12/10/14 1845)  . milrinone 0.25 mcg/kg/min (12/10/14 1845)    Allergies:  Allergies  Allergen Reactions  . Codeine Phosphate Nausea And Vomiting    History   Social History  . Marital Status: Divorced    Spouse Name: N/A  . Number of Children: N/A  .  Years of Education: N/A   Occupational History  . Not on file.   Social History Main Topics  . Smoking status: Former Smoker -- 1.00 packs/day for 50 years    Types: Cigarettes    Quit date: 11/26/2014  . Smokeless tobacco: Never Used  . Alcohol Use: No  . Drug Use: No  . Sexual Activity: No   Other Topics Concern  . Not on file   Social History Narrative   Lives in [place] with his son.  They eat out often and otherwise eat a lot of prepared/processed/microwave-type meals.  He continues to smoke a few cigarettes/day.     Family History  Problem Relation Age of Onset  . CAD    . Stroke Mother   . Heart attack Neg Hx      Review of Systems: Positive for fatigue, malaise, exertional dyspnea, leg swelling, orthopnea, PND, wheezing, easy bruising, and joint pain. Otherwise negative except as per the history of present illness  Physical Exam: Blood pressure 101/29, pulse 84, temperature 97.4 F (36.3 C), temperature source Oral, resp. rate 18, height 5' 11.5" (1.816 m), weight 151 lb 11.2 oz (68.811 kg), SpO2 96 %. Pt is alert and oriented, chronically ill-appearing male, in no distress. HEENT: normal Neck: JVP normal. Carotid upstrokes normal bilateral bruits Lungs: equal expansion, poor air movement bilaterally CV: Regular rate and rhythm with grade 3/6 harsh systolic murmur best heard at the right upper sternal border  and left lower sternal border, with diminished A2 Abd: soft, NT, +BS, no bruit, no hepatosplenomegaly Back: no CVA tenderness Ext: 1+ pretibial pitting edema with both lower extremities wrapped in Ace wraps Skin: warm and dry without rash Neuro: CNII-XII intact             Strength intact = bilaterally    Labs: No results for input(s): CKTOTAL, CKMB, TROPONINI in the last 72 hours. Lab Results  Component Value Date   WBC 11.4* 12/10/2014   HGB 10.8* 12/10/2014   HCT 35.0* 12/10/2014   MCV 76.6* 12/10/2014   PLT 173 12/10/2014    Recent Labs Lab 12/11/14 0527  NA 131*  K 4.1  CL 84*  CO2 31  BUN 91*  CREATININE 2.79*  CALCIUM 8.7*  GLUCOSE 173*   Lab Results  Component Value Date   CHOL 243* 10/31/2013   HDL 40.90 10/31/2013   LDLCALC 182* 10/31/2013   TRIG 102.0 10/31/2013   Lab Results  Component Value Date   DDIMER 2.74* 06/05/2013    Radiology/Studies:  Dg Chest 2 View  12/02/2014   CLINICAL DATA:  Pneumonia.  Right pleural effusion.  EXAM: CHEST  2 VIEW  COMPARISON:  CT 07/22/2014 .  FINDINGS: Mediastinum hilar structures normal. Cardiomegaly with pulmonary vascular congestion. Diffuse interstitial prominence and bilateral pleural effusions noted. These findings are consistent congestive heart failure. No pneumothorax.  IMPRESSION: 1. Prior CABG. 2. Congestive heart failure with pulmonary interstitial edema and bilateral pleural effusions.   Electronically Signed   By: Maisie Fus  Register   On: 12/02/2014 09:32   US Renal  12/08/2014   CLINICAL DATA:  74 year old male with renal failure. Initial encounter.  EXAM: RENAL / URINARY TRACT ULTRASOUND COMPLETE  COMPARISON:  05/10/2014 CT.  FINDINGS: Right Kidney:  Length: 10.6 cm. Renal parenchymal thinning without hydronephrosis. There are at least 3 renal cysts 2 of which appear simple (measuring up to 3.1 cm) however, 1 of the cysts in the upper pole region appears complex measuring 1.8 x 2.1 x  2 cm.  Left Kidney:   Length: 9.7 cm. Renal parenchymal thinning without hydronephrosis. 1.6 and a 2.4 cm cysts mid to lower pole region.  Bladder:  Partially contracted with a thick wall.  IMPRESSION: Bilateral renal parenchymal thinning without hydronephrosis.  Bilateral renal cysts. Right upper pole 2.1 cm cyst is complex. As patient has renal failure, this cannot be evaluated with contrast-enhanced MR. Recommend follow-up renal sonogram in 3-6 months for further delineation (sooner if patient develops hematuria).  Thickened urinary bladder wall. This may be partially explained by partial contraction and bladder outlet changes however, limited for excluding mucosal lesion.   Electronically Signed   By: Lacy Duverney M.D.   On: 12/08/2014 15:01   US Abdomen Limited  12/06/2014   CLINICAL DATA:  74 year old male presents for therapeutic paracentesis for ascites.  EXAM: LIMITED ABDOMEN ULTRASOUND FOR ASCITES  TECHNIQUE: Limited ultrasound survey for ascites was performed in all four abdominal quadrants.  COMPARISON:  CT scan of the abdomen and pelvis 05/10/2014  FINDINGS: Sonographic interrogation of the 4 quadrants of the abdomen demonstrates trace ascites in the perihepatic space and right lower quadrant. The ascites is inadequate for percutaneous drainage. Therefore, paracentesis was not performed.  IMPRESSION: Trace ascites which is of too small volume for paracentesis.  Signed,  Sterling Big, MD  [facility]  [facility]   Electronically Signed   By: Malachy Moan M.D.   On: 12/06/2014 10:13   Cardiac Studies: 2D Echo: Study Conclusions  - Left ventricle: The cavity size was normal. There was moderate concentric hypertrophy. Systolic function was moderately to severely reduced. The estimated ejection fraction was in the range of 30% to 35%. Wall motion was normal; there were no regional wall motion abnormalities. Features are consistent with a  pseudonormal left ventricular filling pattern, with concomitant abnormal relaxation and increased filling pressure (grade 2 diastolic dysfunction). Doppler parameters are consistent with elevated ventricular end-diastolic filling pressure. - Ventricular septum: Septal motion showed paradox. The contour showed diastolic flattening and systolic flattening. - Aortic valve: Valve mobility was restricted. There was moderate stenosis. There was trivial regurgitation. Valve area (VTI): 0.99 cm^2. Valve area (Vmax): 0.87 cm^2. Valve area (Vmean): 0.85 cm^2. - Aortic root: The aortic root was normal in size. - Mitral valve: There was moderate regurgitation. Valve area by pressure half-time: 2.12 cm^2. Valve area by continuity equation (using LVOT flow): 1.32 cm^2. - Left atrium: The atrium was moderately dilated. - Right ventricle: The cavity size was mildly dilated. Wall thickness was normal. Pacer wire or catheter noted in right ventricle. Systolic function was mildly reduced. - Right atrium: The atrium was moderately dilated. Pacer wire or catheter noted in right atrium. - Tricuspid valve: There was severe regurgitation. - Pulmonic valve: There was mild regurgitation. - Pulmonary arteries: Systolic pressure was severely increased. PA peak pressure: 67 mm Hg (S).  Impressions:  - LVEF is 30-35%, there is global hypokinesis with paradoxical septal motion. Grade 2 diastolic dysfunction with severely elevated filling pressures. There is systolic and diastolic septal flattening consistent with RV pressure and volume overload. RV is mildly dilated with mild systolic dysfunction. There is severe pulmonary hypertension.   ASSESSMENT AND PLAN:  74 year old gentleman with advanced biventricular heart failure with New York Heart Association functional class IV symptoms, stage IV chronic kidney disease, and severe diffuse vascular disease status post  multiple previous revascularization surgeries. I have personally reviewed his echo study and he has severe calcification and restricted mobility of  all 3 aortic valve leaflets. His peak and mean transaortic valve gradients are 36 and 19 mmHg, respectively. I have a high suspicion that he has low gradient severe aortic stenosis. He also has echocardiographic evidence of severe RV pressure and volume overload with diastolic and systolic flattening of the interventricular septum, severe tricuspid regurgitation, and severe pulmonary hypertension.  In summary, I think this patient is beyond the point where we can really help him with interventional therapies. Because of his severe vascular disease and advanced kidney disease, there is a high likelihood of complication and progressive renal dysfunction potentially requiring hemodialysis with any procedures involving iodinated contrast. The patient and his son are counseled at length regarding typical workup for TAVR, expected outcomes, and potential complications in the context of his specific comorbidities. They both agree with a plan for palliative medical treatment. I reviewed the poor prognosis associated with low-gradient aortic stenosis which they understand from discussion with Dr Gala Romney.   SignedTonny Bollman MD, [facility] 12/11/2014, 11:37 AM

## 2014-12-11 NOTE — Progress Notes (Signed)
UR COMPLETED  
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
## 2014-12-12 DIAGNOSIS — Z7189 Other specified counseling: Secondary | ICD-10-CM

## 2014-12-12 DIAGNOSIS — R531 Weakness: Secondary | ICD-10-CM

## 2014-12-12 DIAGNOSIS — Z515 Encounter for palliative care: Secondary | ICD-10-CM

## 2014-12-12 LAB — BASIC METABOLIC PANEL
Anion gap: 16 — ABNORMAL HIGH (ref 5–15)
BUN: 98 mg/dL — ABNORMAL HIGH (ref 6–20)
CALCIUM: 8.7 mg/dL — AB (ref 8.9–10.3)
CO2: 31 mmol/L (ref 22–32)
Chloride: 84 mmol/L — ABNORMAL LOW (ref 101–111)
Creatinine, Ser: 2.88 mg/dL — ABNORMAL HIGH (ref 0.61–1.24)
GFR calc Af Amer: 23 mL/min — ABNORMAL LOW (ref >60)
GFR calc non Af Amer: 20 mL/min — ABNORMAL LOW (ref >60)
Glucose, Bld: 205 mg/dL — ABNORMAL HIGH (ref 65–99)
POTASSIUM: 4.2 mmol/L (ref 3.5–5.1)
SODIUM: 131 mmol/L — AB (ref 135–145)

## 2014-12-12 LAB — PROTIME-INR
INR: 2.81 — ABNORMAL HIGH (ref 0.00–1.49)
PROTHROMBIN TIME: 29.1 s — AB (ref 11.6–15.2)

## 2014-12-12 MED ORDER — MILRINONE IN DEXTROSE 20 MG/100ML IV SOLN
0.1250 ug/kg/min | INTRAVENOUS | Status: DC
  Administered 2014-12-12: 0.125 ug/kg/min via INTRAVENOUS
  Filled 2014-12-12 (×2): qty 100

## 2014-12-12 MED ORDER — TRAZODONE HCL 50 MG PO TABS
50.0000 mg | ORAL_TABLET | Freq: Once | ORAL | Status: DC
  Filled 2014-12-12: qty 1

## 2014-12-12 MED ORDER — WARFARIN SODIUM 3 MG PO TABS
3.0000 mg | ORAL_TABLET | Freq: Once | ORAL | Status: AC
  Administered 2014-12-12: 3 mg via ORAL
  Filled 2014-12-12 (×2): qty 1

## 2014-12-12 MED ORDER — HYDRALAZINE HCL 25 MG PO TABS
12.5000 mg | ORAL_TABLET | Freq: Three times a day (TID) | ORAL | Status: DC
  Administered 2014-12-12 – 2014-12-16 (×12): 12.5 mg via ORAL
  Filled 2014-12-12 (×16): qty 0.5

## 2014-12-12 NOTE — Progress Notes (Addendum)
Patient ID: Clinton [name], male   DOB: 1941/06/10, 74 y.o.   MRN: [id number] Advanced Heart Failure Team Progress Note   Subjective   Clinton [name] is a 74 y.o. male with a hx of CAD s/p CABG, PAD s/p Ao-bifem bypass and Ao-R renal bypass, carotid stenosis s/p bilateral CEA, LICA and L CCA stenting, HTN, HL, ICM, systolic HF, aortic stenosis, hypothyroidism, prior CVA, CKD. PAD is followed by [facility].Admitted with low output HF, worsening renal function and marked volume overload.    Milrinone started 6/10. On Carvedilol. On lasix infusion and metolazone. Referred for paracentesis but not enough ascites.  Creatinine stable, 2.64 -> 2.72 -> 2.76 -> 2.79 -> 2.88 today.  Weight down 4 lbs (26 pounds total).  Good urine output on current regimen.   Echo 12/04/14 - 30-35%, global hypokinesis with paradoxical septal motion. Grade 2 diastolic dysfunction. Moderate AS. Complete report below.    (similar to echo 07/10/14, EF unchanged)  Son in room. Feeling ok today.  ACE wrap on legs falling off and wants them taken off or re-wrapped.  Seems at peace with decision to pursue palliative medical care as opposed to invasive procedures.   Objective:    Vital Signs:   Temp:  [97.5 F (36.4 C)-98.2 F (36.8 C)] 97.6 F (36.4 C) (06/17 0445) Pulse Rate:  [77-84] 78 (06/17 0445) Resp:  [18] 18 (06/17 0445) BP: (97-126)/(29-76) 126/66 mmHg (06/17 0445) SpO2:  [94 %-98 %] 98 % (06/17 0445) Weight:  [149 lb 1.6 oz (67.631 kg)] 149 lb 1.6 oz (67.631 kg) (06/17 0445) Last BM Date: 12/11/14  Weight change: Filed Weights   12/10/14 0526 12/11/14 0414 12/12/14 0445  Weight: 155 lb 8 oz (70.534 kg) 151 lb 11.2 oz (68.811 kg) 149 lb 1.6 oz (67.631 kg)    Intake/Output:   Intake/Output Summary (Last 24 hours) at 12/12/14 4098 Last data filed at 12/12/14 0711  Gross per 24 hour  Intake 1500.55 ml  Output   3625 ml  Net -2124.45 ml     Physical Exam: General:  Elderly. Sitting on bed. NAD. Son in  room. HEENT: normal Neck: supple. JVP 7-8 cm. Carotids 2+ bilat; bilateral bruits, hx of CEA. No lymphadenopathy or thryomegaly appreciated. Cor: PMI nondisplaced. Regular rate & rhythm. 2/6 AS with moderately depressed S2, 2/6 MR Lungs: Bibasilar diminished  Abdomen: nontender.  moderately distended. Midline hernia present, easily reducible, non tender. No hepatosplenomegaly. No bruits or masses. Good bowel sounds. Extremities: no cyanosis, clubbing, rash. 1+ pitting edema bilateral LEs to knees R>L. ACE wrap lower LEs Neuro: alert & orientedx3, cranial nerves grossly intact. moves all 4 extremities w/o difficulty. Affect pleasant  Telemetry: NSR 70s-80s with PVCs.   Labs: Basic Metabolic Panel:  Recent Labs Lab 12/06/14 0224  12/08/14 1715 12/09/14 0419 12/10/14 0242 12/11/14 0527 12/12/14 0308  NA 135  < > 132* 133* 131* 131* 131*  K 4.4  < > 4.7 4.4 4.6 4.1 4.2  CL 99*  < > 89* 90* 87* 84* 84*  CO2 24  < > GLUCOSE 106*  < > 116* 88 113* 173* 205*  BUN 71*  < > 79* 84* 85* 91* 98*  CREATININE 2.49*  < > 2.75* 2.72* 2.76* 2.79* 2.88*  CALCIUM 8.5*  < > 8.6* 8.5* 8.7* 8.7* 8.7*  MG 2.4  --  2.6*  --   --   --   --   < > = values in this  interval not displayed.  Liver Function Tests: No results for input(s): AST, ALT, ALKPHOS, BILITOT, PROT, ALBUMIN in the last 168 hours. No results for input(s): LIPASE, AMYLASE in the last 168 hours. No results for input(s): AMMONIA in the last 168 hours.  CBC:  Recent Labs Lab 12/07/14 0309 12/08/14 0350 12/10/14 1155  WBC 7.2 7.4 11.4*  HGB 10.0* 9.6* 10.8*  HCT 31.6* 30.9* 35.0*  MCV 74.7* 74.5* 76.6*  PLT 160 151 173    Cardiac Enzymes: No results for input(s): CKTOTAL, CKMB, CKMBINDEX, TROPONINI in the last 168 hours.  BNP: BNP (last 3 results)  Recent Labs  12/03/14 1909  BNP >4500.0*    ProBNP (last 3 results)  Recent Labs  05/10/14 0935  PROBNP 63888.0*     CBG: No results for  input(s): GLUCAP in the last 168 hours.  Coagulation Studies:  Recent Labs  12/10/14 0242 12/11/14 0527 12/12/14 0308  LABPROT 24.3* 28.0* 29.1*  INR 2.21* 2.66* 2.81*    Other results:   Imaging: No results found.  Latest Echo  12/04/14 LV EF: 30% -  35%  ------------------------------------------------------------------- Indications:   Aortic stenosis 424.1.  ------------------------------------------------------------------- History:  Risk factors: Chronic kidney disease. Dilated Cardiomyopathy. Leg swelling.  ------------------------------------------------------------------- Study Conclusions  - Left ventricle: The cavity size was normal. There was moderate concentric hypertrophy. Systolic function was moderately to severely reduced. The estimated ejection fraction was in the range of 30% to 35%. Wall motion was normal; there were no regional wall motion abnormalities. Features are consistent with a pseudonormal left ventricular filling pattern, with concomitant abnormal relaxation and increased filling pressure (grade 2 diastolic dysfunction). Doppler parameters are consistent with elevated ventricular end-diastolic filling pressure. - Ventricular septum: Septal motion showed paradox. The contour showed diastolic flattening and systolic flattening. - Aortic valve: Valve mobility was restricted. There was moderate stenosis. There was trivial regurgitation. Valve area (VTI): 0.99 cm^2. Valve area (Vmax): 0.87 cm^2. Valve area (Vmean): 0.85 cm^2. - Aortic root: The aortic root was normal in size. - Mitral valve: There was moderate regurgitation. Valve area by pressure half-time: 2.12 cm^2. Valve area by continuity equation (using LVOT flow): 1.32 cm^2. - Left atrium: The atrium was moderately dilated. - Right ventricle: The cavity size was mildly dilated. Wall thickness was normal. Pacer wire or catheter noted in  right ventricle. Systolic function was mildly reduced. - Right atrium: The atrium was moderately dilated. Pacer wire or catheter noted in right atrium. - Tricuspid valve: There was severe regurgitation. - Pulmonic valve: There was mild regurgitation. - Pulmonary arteries: Systolic pressure was severely increased. PA peak pressure: 67 mm Hg (S).  Impressions:  - LVEF is 30-35%, there is global hypokinesis with paradoxical septal motion. Grade 2 diastolic dysfunction with severely elevated filling pressures. There is systolic and diastolic septal flattening consistent with RV pressure and volume overload. RV is mildly dilated with mild systolic dysfunction. There is severe pulmonary hypertension.  Medications:     Current Medications: . carvedilol  3.125 mg Oral BID WC  . dextromethorphan-guaiFENesin  1 tablet Oral BID  . ezetimibe  10 mg Oral Daily  . feeding supplement (ENSURE ENLIVE)  237 mL Oral BID BM  . isosorbide mononitrate  30 mg Oral Daily  . levothyroxine  75 mcg Oral QAC breakfast  . metolazone  5 mg Oral Daily  . potassium chloride  20 mEq Oral BID  . rosuvastatin  40 mg Oral Daily  . sodium chloride  3 mL Intravenous Q12H  . Warfarin -  Pharmacist Dosing Inpatient   Does not apply q1800    Infusions: . furosemide (LASIX) infusion 15 mg/hr (12/12/14 0558)  . milrinone 0.25 mcg/kg/min (12/12/14 0417)     Assessment:   1. Acute on chronic systolic and diastolic CHF, NYHA Class IV: Ischemic cardiomyopathy. EF 30-35%.  He has declined ICD in the past.  2. Acute on CKD stage 4, GFR 15-29: monitor closely with diuresis. 3. Congested dilated cardiomyopathy 4. Moderate AS 5. CAD, hx of CABG: crestor  6. HTN: on hydralazine/Imdur 7. History of stroke: on Coumadin per pharm.  Consult 6/14 8. PAD 9. Acute on chronic respiratory failure 10. Ascites 11. Microcytic anemia - improved with feraheme 12. Uric acid elevation - completed course of  prednisone.  Now euvolemic. Creatinine up slightly. Hold diuretics today.  Continue current Coreg and hydralazine/Imdur.  No ACEI with CKD stage IV.  Continue to monitor K and Mg, goal 4.0 and >2.0, respectively. (K 4.2 and Mg 2.6) Vascular US shows Right renal artery bypass graft patent, Left renal artery occluded and without bypass.    Pt and son had at length discussion with Dr. Gala Romney and Dr. Excell Seltzer yesterday about where to proceed from here.  All parties agree invasive procedures carry too much risk at this time, and agree to proceed with palliative medical treatment.  Palliative care to see.  Will likely wean off milrinone today vs tomorrow to see how he does.  Graciella Freer, PA-C 7:12 AM  12/12/2014   Patient seen and examined with Otilio Saber, PA-C. We discussed all aspects of the encounter. I agree with the assessment and plan as stated above.   Volume status much improved. Creatinine up slightly. Will hold IV lasix. Start po lasix either tomorrow or Sunday. Will need to stay in hospital until we find right diuretic dose. Add hydralazine. Long talk about options and will proceed with medical therapy for now. Wean milrinone as tolerated. Agree with palliative care evaluation.   Bensimhon, Daniel,MD 1:55 PM

## 2014-12-12 NOTE — Progress Notes (Addendum)
The patient was given Tylenol for generalized chronic pain x1 overnight.  He was not able to sleep much.  A heart failure packet was given to him this morning.  The skin tear on his left elbow was oozing at the beginning of the shift and had bled through gauze and a foam dressing.  He stated that it had been oozing for several days and acted concerned about it.  Pressure was held to the site for 10 minutes until it stopped bleeding and gauze, foam, and Kerlex were placed over the site.  Drainage could not be seen through the dressing this morning.

## 2014-12-12 NOTE — Consult Note (Signed)
Consultation Note Date: 12/12/2014   Patient Name: Clinton [name]  DOB: 1941/06/05  MRN: [id number]  Age / Sex: 74 y.o., male   PCP: No Pcp Per Patient Referring Physician: Duke Salvia, MD  Reason for Consultation: Disposition, Establishing goals of care and Psychosocial/spiritual support  Palliative Care Assessment and Plan Summary of Established Goals of Care and Medical Treatment Preferences    Palliative Care Discussion Held Today:    This NP Lorinda Creed reviewed medical records, received report from team, assessed the patient and then meet at the patient's bedside along with his son/[name] and daughter [name]  to discuss diagnosis prognosis, GOC, EOL wishes disposition and options.  A detailed discussion was had today regarding advanced directives.  Concepts specific to code status, artifical feeding and hydration, continued IV antibiotics and rehospitalization was had.  The difference between a aggressive medical intervention path  and a palliative comfort care path for this patient at this time was had.  Values and goals of care important to patient and family were attempted to be elicited.  Patient was clearly able to verbalize his support of medical management of his chronic disease and plans to f/u with heart failure clinic.  Also he clearly communicated with his children that he does not want heroics and that comfort and quality were a priority over quantity of life  Concept of Hospice and Palliative Care were discussed  Natural trajectory and expectations at EOL were discussed.  Questions and concerns addressed.  Family encouraged to call with questions or concerns.  PMT will continue to support holistically.  MOST form was introduced   Contacts/Participants in Discussion:  Patient his son [name] and his daughter [name] and her husband  Primary Decision Maker:  Patient has capacity, he is requesting HPOA be secured and names his son Birch Farino as HPOA, Spiritual care  consulted  Goals of Care/Code Status/Advance Care Planning:   Code Status: Limited-no intubation  Artificial feeding: not now or in the future  Antibiotics: yes  Diagnostics: yes  Rehospitalization: yes   Symptom Management:   Weakness: PT/OT evaluation and treatment, open to SNF for rehabilitation  Psycho-social/Spiritual:   Support System: Four children  Desire for further Chaplaincy support:   Yes, to secure HPOA   Discharge Planning:    Family is hopeful for   Skilled Nursing Facility for rehab with Palliative care service follow-up.   It is getting more and more difficult to provide safe care at home.       Chief Complaint:  Weakness, fluid overload  History of Present Illness:  Clinton [name] is a 74 y.o. male with a hx of CAD s/p CABG, PAD s/p Ao-bifem bypass and Ao-R renal bypass, carotid stenosis s/p bilateral CEA, LICA and L CCA stenting, HTN, HL, ICM, systolic HF, aortic stenosis, hypothyroidism, prior CVA, CKD. PAD is followed by [facility]. He was seen by Dr. Hillis Range in 2012 and declined ICD implantation. Admitted in 12/14 for a/c systolic HF. Recent CT showed pulmonary nodules and is followed by pulmonary. Last seen by Dr. Olga Millers 1/16.   Over the last few months, he has noted increasing LE edema, orthopnea and fatigue. He is chronically short of breath.  Admitted for stablization   Patient is faced with treatment option decisions, advanced directive decisions and anticipatory care needs.   Primary Diagnoses  Present on Admission:  . CAD, ARTERY BYPASS GRAFT . Essential hypertension . TOBACCO ABUSE  Palliative Review of Systems:    -weakness, fatigue,  sob on exertion   I have reviewed the medical record, interviewed the patient and family, and examined the patient. The following aspects are pertinent.  Past Medical History  Diagnosis Date  . CAD (coronary artery disease)     a. CABG 1985;  b. 05/2002 Rotablator/PTCA to ostial  LCX and RI;  c. 12/2009 MV: inf and lat infarct w/ minimal mid lateral and basilar ant ischemia, EF 29%->Med Rx.  . Cardiomyopathy, ischemic     a. 06/2010 Cardiac MRI: EF 30%, anterolat, post, inf prior subendocardial infarcts;  b. 08/2012 Echo: EF 30-35%, mild LVH mult wma's, Gr 2 DD;  c. 05/2013 Echo: EF 20-25%, diff HK, mod AS, mild MR, mod dil LA, mild to mod TR, PASP .  . Stroke     remote  . Hypertension   . Hyperlipidemia   . Tobacco abuse   . Anticoagulated on Coumadin   . Chronic systolic CHF (congestive heart failure)     a. 05/2013 EF 20-25%, diff HK.  . Moderate aortic stenosis     a. 05/2013 Echo: at least moderate AS (Valve area 1.07 cm2 - VTI; 1.08cm2 - Vmax).  . Hypothyroidism     a. 05/2013 TSH 50.15 - synthroid 50 mcg daily started.  . Peripheral arterial disease     a. Ao-bifem bypass w/ 16x8 hemashield dacron graft, R aortorenal bypass w/ 6mm dacron graft;  b. 09/2011 ABI: R 0/87, L 0.92.  . Carotid artery occlusion     a. 12/2002 R CEA;  b. 05/2007 L Carotid stenting;  c. 09/2008 repeat L carotid stenting 2/2 ISR;  d. 09/2011 Carotid U/S: RICA 40-59%, LICA 60-79%, >50 dist LCCA stenosis.  . CKD (chronic kidney disease), stage III   . Myocardial infarction 12/2009    MV: inf and lat infarct w/ minimal mid lateral and basilar ant ischemia, EF 29%->Med Rx  . Myocardial infarction 1985    "that's why I had the bypass"  . COPD (chronic obstructive pulmonary disease)   . Pneumonia 05/2014  . Arthritis     "hands" (12/03/2014)  . 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 Marital Status: Divorced    Spouse Name: N/A  . Number of Children: N/A  . Years of Education: N/A   Social History Main Topics  . Smoking status: Former Smoker -- 1.00 packs/day for 50 years    Types: Cigarettes    Quit date: 11/26/2014  . Smokeless tobacco: Never Used  . Alcohol Use: No  . Drug Use: No  . Sexual Activity: No   Other Topics  Concern  . None   Social History Narrative   Lives in [place] with his son.  They eat out often and otherwise eat a lot of prepared/processed/microwave-type meals.  He continues to smoke a few cigarettes/day.   Family History  Problem Relation Age of Onset  . CAD    . Stroke Mother   . Heart attack Neg Hx    Scheduled Meds: . carvedilol  3.125 mg Oral BID WC  . dextromethorphan-guaiFENesin  1 tablet Oral BID  . ezetimibe  10 mg Oral Daily  . feeding supplement (ENSURE ENLIVE)  237 mL Oral BID BM  . isosorbide mononitrate  30 mg Oral Daily  . levothyroxine  75 mcg Oral QAC breakfast  . potassium chloride  20 mEq Oral BID  . rosuvastatin  40 mg Oral Daily  . sodium chloride  3 mL Intravenous Q12H  . warfarin  3 mg Oral ONCE-1800  . Warfarin - Pharmacist Dosing Inpatient   Does not apply q1800   Continuous Infusions: . milrinone     PRN Meds:.sodium chloride, acetaminophen, ondansetron (ZOFRAN) IV, sodium chloride, traMADol Medications Prior to Admission:  Prior to Admission medications   Medication Sig Start Date End Date Taking? Authorizing Provider  carvedilol (COREG) 12.5 MG tablet take 1 and 1/2 tablets by mouth twice a day 11/04/14  Yes Lewayne Bunting, MD  ezetimibe (ZETIA) 10 MG tablet Take 10 mg by mouth at bedtime.    Yes Historical Provider, MD  feeding supplement, ENSURE COMPLETE, (ENSURE COMPLETE) LIQD Take 237 mLs by mouth 2 (two) times daily between meals. Patient taking differently: Take 237 mLs by mouth daily.  05/24/14  Yes Starleen Arms, MD  furosemide (LASIX) 40 MG tablet Take 1 tablet (40 mg total) by mouth daily. 06/11/14  Yes Dwana Melena, PA-C  hydrALAZINE (APRESOLINE) 50 MG tablet Take 1 tablet (50 mg total) by mouth 3 (three) times daily. 11/27/14  Yes Lewayne Bunting, MD  levothyroxine (SYNTHROID, LEVOTHROID) 75 MCG tablet Take 1 tablet (75 mcg total) by mouth daily before breakfast. 07/11/14  Yes Lewayne Bunting, MD  potassium chloride (K-DUR)  10 MEQ tablet Take 1 tablet (10 mEq total) by mouth daily. Patient taking differently: Take 10 mEq by mouth at bedtime.  06/11/14  Yes Dwana Melena, PA-C  rosuvastatin (CRESTOR) 40 MG tablet Take 1 tablet (40 mg total) by mouth daily. Patient taking differently: Take 40 mg by mouth at bedtime.  06/19/13  Yes Rosalio Macadamia, NP  Umeclidinium Bromide (INCRUSE ELLIPTA) 62.5 MCG/INH AEPB Inhale 1 puff into the lungs daily.   Yes Historical Provider, MD  warfarin (COUMADIN) 6 MG tablet take as directed BY COUMADIN CLINIC Patient taking differently: 6 mg on Monday.  3 mg on all other days. 11/27/14  Yes Lewayne Bunting, MD  isosorbide mononitrate (IMDUR) 30 MG 24 hr tablet take 1 tablet by mouth once daily 12/04/14   Lewayne Bunting, MD   Allergies  Allergen Reactions  . Codeine Phosphate Nausea And Vomiting   CBC:    Component Value Date/Time   WBC 11.4* 12/10/2014 1155   HGB 10.8* 12/10/2014 1155   HCT 35.0* 12/10/2014 1155   PLT 173 12/10/2014 1155   MCV 76.6* 12/10/2014 1155   NEUTROABS 6.2 12/03/2014 1909   LYMPHSABS 0.9 12/03/2014 1909   MONOABS 1.1* 12/03/2014 1909   EOSABS 0.1 12/03/2014 1909   BASOSABS 0.0 12/03/2014 1909   Comprehensive Metabolic Panel:    Component Value Date/Time   NA 131* 12/12/2014 0308   K 4.2 12/12/2014 0308   CL 84* 12/12/2014 0308   CO2 31 12/12/2014 0308   BUN 98* 12/12/2014 0308   CREATININE 2.88* 12/12/2014 0308   CREATININE 1.49* 07/17/2012 1257   GLUCOSE 205* 12/12/2014 0308   CALCIUM 8.7* 12/12/2014 0308   AST 18 12/03/2014 1909   ALT 14* 12/03/2014 1909   ALKPHOS 183* 12/03/2014 1909   BILITOT 0.9 12/03/2014 1909   PROT 6.5 12/03/2014 1909   ALBUMIN 3.2* 12/03/2014 1909    Physical Exam:  Vital Signs: BP 126/66 mmHg  Pulse 78  Temp(Src) 97.6 F (36.4 C) (Oral)  Resp 18  Ht 5' 11.5" (1.816 m)  Wt 67.631 kg (149 lb 1.6 oz)  BMI 20.51 kg/m2  SpO2 98% SpO2: SpO2: 98 % O2 Device: O2 Device: Not Delivered O2  Flow Rate:     Intake/output summary:  Intake/Output Summary (Last 24 hours) at 12/12/14 1259 Last data filed at 12/12/14 1000  Gross per 24 hour  Intake 1640.55 ml  Output   3125 ml  Net -1484.45 ml   LBM: Last BM Date: 12/11/14 Baseline Weight: Weight: 79.652 kg (175 lb 9.6 oz) Most recent weight: Weight: 67.631 kg (149 lb 1.6 oz) (scale A)  Exam Findings:   General: chronically ill appearing, frail, NAD HEENT: moist buccal membranes, no exudate CVS: RRR Resp: decreased in bases Extrem:  + 1 edema BLE Neuro: alert and oriented X3           Palliative Performance Scale: 40 %                Additional Data Reviewed: Recent Labs     12/10/14  1155  12/11/14  0527  12/12/14  0308  WBC  11.4*   --    --   HGB  10.8*   --    --   PLT  173   --    --   NA   --   131*  131*  BUN   --   91*  98*  CREATININE   --   2.79*  2.88*     Time In: 1145 Time Out: 1300 Time Total: 75 min  Greater than 50%  of this time was spent counseling and coordinating care related to the above assessment and plan.  Discussed with  Dr Gala Romney  Signed by: Lorinda Creed, NP  Canary Brim, NP  12/12/2014, 12:59 PM  Please contact Palliative Medicine Team phone at [phone] for questions and concerns.   See AMION for contact information

## 2014-12-12 NOTE — Progress Notes (Signed)
Patient refuses to wear TED hoses, stated the TED hoses makes his legs cramp.

## 2014-12-12 NOTE — Progress Notes (Signed)
Orthopedic Tech Progress Note Patient Details:  Clinton [name] [birth year] [id number]  Patient ID: [name], male   DOB: June 18, 1941, 74 y.o.   MRN: [id number] The doctor stated that pt does not need to have unna boots reapplied because ted hose will be put on pt's legs.  Nikki Dom 12/12/2014, 11:46 AM

## 2014-12-12 NOTE — Progress Notes (Signed)
ANTICOAGULATION CONSULT NOTE - Follow Up Consult  Pharmacy Consult for Coumadin Indication: h/o CVA  Allergies  Allergen Reactions  . Codeine Phosphate Nausea And Vomiting    Patient Measurements: Height: 5' 11.5" (181.6 cm) Weight: 149 lb 1.6 oz (67.631 kg) (scale A) IBW/kg (Calculated) : 76.45 Heparin Dosing Weight:    Vital Signs: Temp: 97.6 F (36.4 C) (06/17 0445) Temp Source: Oral (06/17 0445) BP: 126/66 mmHg (06/17 0445) Pulse Rate: 78 (06/17 0445)  Labs:  Recent Labs  12/10/14 0242 12/10/14 1155 12/11/14 0527 12/12/14 0308  HGB  --  10.8*  --   --   HCT  --  35.0*  --   --   PLT  --  173  --   --   LABPROT 24.3*  --  28.0* 29.1*  INR 2.21*  --  2.66* 2.81*  CREATININE 2.76*  --  2.79* 2.88*    Estimated Creatinine Clearance: 21.5 mL/min (by C-G formula based on Cr of 2.88).   Assessment: CC:  CHF exacerbation- increasing LE edema, orthopnea, fatigue & chronically SOB.  Renal fx slight bump - backing off on diuresis today.  Anticoagulation: Warfarin PTA for hx CVA (no Hx AFib).  INR 2.81.  Had a few extra doses of 6 mg this week.  No bleeding or complications noted. PTA dose 3 mg daily EXCEPT for 6 mg on Mondays only  Goal of Therapy:  INR 2-3 Monitor platelets by anticoagulation protocol: Yes   Plan:  Warfarin 3 mg x1 today Daily PT/INR.  Tad Moore, BCPS  Clinical Pharmacist Pager [phone]  12/12/2014 7:22 AM

## 2014-12-12 NOTE — Consult Note (Addendum)
WOC consult requested to re-apply compression wraps. Bilat legs without open wounds or weeping, pt states previous edema has improved. Compression wraps were removed for physician to assess legs.  Pt had Una boots applied bilat earlier this week, according to the EMR.  Paged ortho tech to reapply [employer] and coban and change Q Fri and Tues.  Pt will need follow-up from home health nurses or in clinic if Una boots aredesired to be continued after discharge. Please re-consult if further assistance is needed.  Thank-you,  Cammie Mcgee MSN, RN, CWOCN, [place], CNS [phone]

## 2014-12-13 LAB — BASIC METABOLIC PANEL
Anion gap: 16 — ABNORMAL HIGH (ref 5–15)
BUN: 101 mg/dL — AB (ref 6–20)
CALCIUM: 8.6 mg/dL — AB (ref 8.9–10.3)
CO2: 33 mmol/L — ABNORMAL HIGH (ref 22–32)
Chloride: 83 mmol/L — ABNORMAL LOW (ref 101–111)
Creatinine, Ser: 2.78 mg/dL — ABNORMAL HIGH (ref 0.61–1.24)
GFR, EST AFRICAN AMERICAN: 24 mL/min — AB (ref >60)
GFR, EST NON AFRICAN AMERICAN: 21 mL/min — AB (ref >60)
GLUCOSE: 101 mg/dL — AB (ref 65–99)
Potassium: 3.9 mmol/L (ref 3.5–5.1)
SODIUM: 132 mmol/L — AB (ref 135–145)

## 2014-12-13 LAB — PROTIME-INR
INR: 2.5 — ABNORMAL HIGH (ref 0.00–1.49)
PROTHROMBIN TIME: 26.7 s — AB (ref 11.6–15.2)

## 2014-12-13 MED ORDER — WARFARIN SODIUM 3 MG PO TABS
3.0000 mg | ORAL_TABLET | Freq: Once | ORAL | Status: AC
  Administered 2014-12-13: 3 mg via ORAL
  Filled 2014-12-13: qty 1

## 2014-12-13 NOTE — Progress Notes (Signed)
ANTICOAGULATION CONSULT NOTE - Follow Up Consult  Pharmacy Consult for Coumadin Indication: h/o CVA  Allergies  Allergen Reactions  . Codeine Phosphate Nausea And Vomiting    Patient Measurements: Height: 5' 11.5" (181.6 cm) Weight: 146 lb 12.8 oz (66.588 kg) (scale a) IBW/kg (Calculated) : 76.45 Heparin Dosing Weight:    Vital Signs: Temp: 97.5 F (36.4 C) (06/18 0442) Temp Source: Oral (06/18 0442) BP: 97/50 mmHg (06/18 0800) Pulse Rate: 75 (06/18 0442)  Labs:  Recent Labs  12/11/14 0527 12/12/14 0308 12/13/14 0529  LABPROT 28.0* 29.1* 26.7*  INR 2.66* 2.81* 2.50*  CREATININE 2.79* 2.88* 2.78*    Estimated Creatinine Clearance: 22 mL/min (by C-G formula based on Cr of 2.78).   Assessment: CC:  CHF exacerbation- increasing LE edema, orthopnea, fatigue & chronically SOB.  Renal fx slight bump - backing off on diuresis today.  Anticoagulation: Warfarin PTA for hx CVA (no Hx AFib).  INR 2.5.  Had a few extra doses of 6 mg this week.  No bleeding or complications noted. PTA dose 3 mg daily EXCEPT for 6 mg on Mondays only  Goal of Therapy:  INR 2-3 Monitor platelets by anticoagulation protocol: Yes   Plan:  Warfarin 3 mg x1 today Daily PT/INR.  Vinnie Level, PharmD., BCPS Clinical Pharmacist Pager [phone]

## 2014-12-13 NOTE — Evaluation (Signed)
Physical Therapy Evaluation Patient Details Name: Clinton [name] MRN: [id number] DOB: 05-02-1941 Today's Date: 12/13/2014   History of Present Illness  Clinton [name] is a 74 y.o. male with a hx of CAD s/p CABG, PAD s/p Ao-bifem bypass and Ao-R renal bypass, carotid stenosis s/p bilateral CEA, LICA and L CCA stenting, HTN, HL, ICM, systolic HF, aortic stenosis, hypothyroidism, prior CVA, CKD. PAD is followed by [facility]. He was seen by Dr. Hillis Range in 2012 and declined ICD implantation. Admitted in 12/14 for a/c systolic HF. Recent CT showed pulmonary nodules and is followed by pulmonary. Has had increasing SOB and fatigue.  Clinical Impression  Pt admitted with above diagnosis. Pt currently with functional limitations due to the deficits listed below (see PT Problem List). Pt's mobility is improving as pain in LE's improves, ambulated 350' today with RW and min-guard A. Recommend RW for home for pain control. Pt will benefit from skilled PT to increase their independence and safety with mobility to allow discharge to the venue listed below.       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 No dizziness or LOB  Ambulation/Gait Ambulation/Gait assistance: Min guard Ambulation Distance (Feet): 350 Feet Assistive device: Rolling walker (2  wheeled) Gait Pattern/deviations: Step-through pattern Gait velocity: decreased Gait velocity interpretation: <1.8 ft/sec, indicative of risk for recurrent falls General Gait Details: use of RW to help manage pain in feet, pt reported that this did help and he was able to ambulate more today than he has in past week as foot pain is less today than it has been. Slow pace with vc's for posture  Stairs            Wheelchair Mobility    Modified Rankin (Stroke Patients Only)       Balance Overall balance assessment: Needs assistance Sitting-balance support: No upper extremity supported Sitting balance-Leahy Scale: Good     Standing balance support: No upper extremity supported Standing balance-Leahy Scale: Fair Standing balance comment: able to maintain static balance without UE support, UE support given for dynamic activity as pain in feet limits dynamic balance                             Pertinent Vitals/Pain Pain Assessment: Faces Faces Pain Scale: Hurts little more Pain Location: bilateral feet and legs R>L Pain Descriptors / Indicators: Aching Pain Intervention(s): Monitored during session;Limited activity within patient's tolerance  VSS on RA    Home Living Family/patient expects to be discharged to:: Private residence Living Arrangements: Children Available Help at Discharge: Family;Available PRN/intermittently Type of Home: House Home Access: Stairs to enter Entrance Stairs-Rails: None Entrance Stairs-Number of Steps: 3 Home Layout: One level Home Equipment: Cane - single point Additional Comments: pt can enter through  the front with no stairs. Lives with son who work 12 hr shifts. Still drives. Has not been mobile past few weeks due to gout in feet and knees    Prior Function Level of Independence: Independent         Comments: was independent before gout. Had PNA in winter and [facility]     Hand Dominance    Dominant Hand: Right    Extremity/Trunk Assessment   Upper Extremity Assessment: Overall WFL for tasks assessed           Lower Extremity Assessment: Generalized weakness;RLE deficits/detail;LLE deficits/detail RLE Deficits / Details: grossly 3+/5 throughout, limited by pain with resisted flexion and extension at knee.  LLE Deficits / Details: same as RLE  Cervical / Trunk Assessment: Kyphotic  Communication   Communication: HOH  Cognition Arousal/Alertness: Awake/alert Behavior During Therapy: WFL for tasks assessed/performed Overall Cognitive Status: Within Functional Limits for tasks assessed                      General Comments General comments (skin integrity, edema, etc.): pitting edema right LE    Exercises        Assessment/Plan    PT Assessment Patient needs continued PT services  PT Diagnosis Difficulty walking;Acute pain;Generalized weakness   PT Problem List Decreased strength;Decreased activity tolerance;Decreased balance;Decreased mobility;Decreased knowledge of use of DME;Decreased knowledge of precautions;Pain  PT Treatment Interventions DME instruction;Gait training;Stair training;Functional mobility training;Therapeutic activities;Therapeutic exercise;Balance training;Patient/family education   PT Goals (Current goals can be found in the Care Plan section) Acute Rehab PT Goals Patient Stated Goal: return home, pt does not want to go to rehab at this time PT Goal Formulation: With patient Time For Goal Achievement: 12/27/14 Potential to Achieve Goals: Good    Frequency Min 3X/week   Barriers to discharge        [name]-evaluation               End of Session Equipment Utilized During Treatment: Gait belt Activity Tolerance: Patient tolerated treatment well Patient left: in bed;with call bell/phone within reach Nurse Communication: Mobility status         Time: 0943-1000 PT Time Calculation (min) (ACUTE ONLY): 17  min   Charges:   PT Evaluation $Initial PT Evaluation Tier I: 1 Procedure     PT G Codes:       Clinton [name], PT  Acute Rehab Services  [phone]  [place], [place] 12/13/2014, 10:12 AM

## 2014-12-13 NOTE — Progress Notes (Signed)
Patient ID: Clinton [name], male   DOB: [birth year], 74 y.o.   MRN: [id number] Advanced Heart Failure Team Progress Note   Subjective   Clinton [name] is a 74 y.o. male with a hx of CAD s/p CABG, PAD s/p Ao-bifem bypass and Ao-R renal bypass, carotid stenosis s/p bilateral CEA, LICA and L CCA stenting, HTN, HL, ICM, systolic HF, aortic stenosis, hypothyroidism, prior CVA, CKD. PAD is followed by [facility].Admitted with low output HF, worsening renal function and marked volume overload.    Echo 12/04/14 - 30-35%, global hypokinesis with paradoxical septal motion. Grade 2 diastolic dysfunction. Moderate AS. Complete report below.    (similar to echo 07/10/14, EF unchanged) - felt to have severe low gradient AS  IV lasix and metolazone stopped yesterday. Milrinone being weaned. Seen by Palliative Care and now limited code (no intubation) . Weight down another 3 pounds. Creatinine stable. Feels ok.    Objective:    Vital Signs:   Temp:  [97.5 F (36.4 C)-98.4 F (36.9 C)] 97.5 F (36.4 C) (06/18 0442) Pulse Rate:  [75-84] 75 (06/18 0442) Resp:  [16-19] 16 (06/18 0442) BP: (102-130)/(61-77) 103/77 mmHg (06/18 0442) SpO2:  [95 %-97 %] 96 % (06/18 0442) Weight:  [66.588 kg (146 lb 12.8 oz)] 66.588 kg (146 lb 12.8 oz) (06/18 0442) Last BM Date: 12/11/14  Weight change: Filed Weights   12/11/14 0414 12/12/14 0445 12/13/14 0442  Weight: 68.811 kg (151 lb 11.2 oz) 67.631 kg (149 lb 1.6 oz) 66.588 kg (146 lb 12.8 oz)    Intake/Output:   Intake/Output Summary (Last 24 hours) at 12/13/14 0834 Last data filed at 12/13/14 0826  Gross per 24 hour  Intake 1957.85 ml  Output   2025 ml  Net -67.15 ml     Physical Exam: General:  Elderly. Sitting on bed. NAD. HEENT: normal Neck: supple. JVP 8 cm. Carotids 2+ bilat; bilateral bruits, hx of CEA. No lymphadenopathy or thryomegaly appreciated. Cor: PMI nondisplaced. Regular rate & rhythm. 2/6 AS with moderately depressed S2, 2/6 MR Lungs:  Bibasilar diminished  Abdomen: nontender.  non distended. Midline hernia present, easily reducible, non tender. No hepatosplenomegaly. No bruits or masses. Good bowel sounds. Extremities: no cyanosis, clubbing, rash. Tr edema TED hose Neuro: alert & orientedx3, cranial nerves grossly intact. moves all 4 extremities w/o difficulty. Affect pleasant  Telemetry: NSR 70s-80s with PVCs.   Labs: Basic Metabolic Panel:  Recent Labs Lab 12/08/14 1715 12/09/14 0419 12/10/14 0242 12/11/14 0527 12/12/14 0308 12/13/14 0529  NA 132* 133* 131* 131* 131* 132*  K 4.7 4.4 4.6 4.1 4.2 3.9  CL 89* 90* 87* 84* 84* 83*  CO2 28 30 31 31 31  33*  GLUCOSE 116* 88 113* 173* 205* 101*  BUN 79* 84* 85* 91* 98* 101*  CREATININE 2.75* 2.72* 2.76* 2.79* 2.88* 2.78*  CALCIUM 8.6* 8.5* 8.7* 8.7* 8.7* 8.6*  MG 2.6*  --   --   --   --   --     Liver Function Tests: No results for input(s): AST, ALT, ALKPHOS, BILITOT, PROT, ALBUMIN in the last 168 hours. No results for input(s): LIPASE, AMYLASE in the last 168 hours. No results for input(s): AMMONIA in the last 168 hours.  CBC:  Recent Labs Lab 12/07/14 0309 12/08/14 0350 12/10/14 1155  WBC 7.2 7.4 11.4*  HGB 10.0* 9.6* 10.8*  HCT 31.6* 30.9* 35.0*  MCV 74.7* 74.5* 76.6*  PLT 160 151 173    Cardiac Enzymes: No results for input(s): CKTOTAL,  CKMB, CKMBINDEX, TROPONINI in the last 168 hours.  BNP: BNP (last 3 results)  Recent Labs  12/03/14 1909  BNP >4500.0*    ProBNP (last 3 results)  Recent Labs  05/10/14 0935  PROBNP 63888.0*     CBG: No results for input(s): GLUCAP in the last 168 hours.  Coagulation Studies:  Recent Labs  12/11/14 0527 12/12/14 0308 12/13/14 0529  LABPROT 28.0* 29.1* 26.7*  INR 2.66* 2.81* 2.50*    Other results:   Imaging: No results found.  Latest Echo  12/04/14 LV EF: 30% -  35%  ------------------------------------------------------------------- Indications:   Aortic stenosis  424.1.  ------------------------------------------------------------------- History:  Risk factors: Chronic kidney disease. Dilated Cardiomyopathy. Leg swelling.  ------------------------------------------------------------------- Study Conclusions  - Left ventricle: The cavity size was normal. There was moderate concentric hypertrophy. Systolic function was moderately to severely reduced. The estimated ejection fraction was in the range of 30% to 35%. Wall motion was normal; there were no regional wall motion abnormalities. Features are consistent with a pseudonormal left ventricular filling pattern, with concomitant abnormal relaxation and increased filling pressure (grade 2 diastolic dysfunction). Doppler parameters are consistent with elevated ventricular end-diastolic filling pressure. - Ventricular septum: Septal motion showed paradox. The contour showed diastolic flattening and systolic flattening. - Aortic valve: Valve mobility was restricted. There was moderate stenosis. There was trivial regurgitation. Valve area (VTI): 0.99 cm^2. Valve area (Vmax): 0.87 cm^2. Valve area (Vmean): 0.85 cm^2. - Aortic root: The aortic root was normal in size. - Mitral valve: There was moderate regurgitation. Valve area by pressure half-time: 2.12 cm^2. Valve area by continuity equation (using LVOT flow): 1.32 cm^2. - Left atrium: The atrium was moderately dilated. - Right ventricle: The cavity size was mildly dilated. Wall thickness was normal. Pacer wire or catheter noted in right ventricle. Systolic function was mildly reduced. - Right atrium: The atrium was moderately dilated. Pacer wire or catheter noted in right atrium. - Tricuspid valve: There was severe regurgitation. - Pulmonic valve: There was mild regurgitation. - Pulmonary arteries: Systolic pressure was severely increased. PA peak pressure: 67 mm Hg (S).  Impressions:  - LVEF is  30-35%, there is global hypokinesis with paradoxical septal motion. Grade 2 diastolic dysfunction with severely elevated filling pressures. There is systolic and diastolic septal flattening consistent with RV pressure and volume overload. RV is mildly dilated with mild systolic dysfunction. There is severe pulmonary hypertension.  Medications:     Current Medications: . carvedilol  3.125 mg Oral BID WC  . dextromethorphan-guaiFENesin  1 tablet Oral BID  . ezetimibe  10 mg Oral Daily  . feeding supplement (ENSURE ENLIVE)  237 mL Oral BID BM  . hydrALAZINE  12.5 mg Oral 3 times per day  . isosorbide mononitrate  30 mg Oral Daily  . levothyroxine  75 mcg Oral QAC breakfast  . rosuvastatin  40 mg Oral Daily  . sodium chloride  3 mL Intravenous Q12H  . Warfarin - Pharmacist Dosing Inpatient   Does not apply q1800    Infusions: . milrinone 0.125 mcg/kg/min (12/12/14 2218)     Assessment:   1. Acute on chronic systolic and diastolic CHF, NYHA Class IV: Ischemic cardiomyopathy. EF 30-35%.  He has declined ICD in the past.  2. Acute on CKD stage 4, GFR 15-29: monitor closely with diuresis. 4. Severe, low-gradient AS 5. CAD, hx of CABG: crestor  6. HTN: on hydralazine/Imdur 7. History of stroke: on Coumadin per pharm.  Consult 6/14 8. PAD 9. Acute on chronic respiratory  failure 10. Ascites 11. Microcytic anemia - improved with feraheme 12. Acute gout - completed course of prednisone.  Volume status much improved. Now off diuretics Creatinine improved. Will continue to hold diuretics. Hydralazine started. Will stop milrinone. Have PT to see. Suspect He will need SNF. Not candidate for AVR. Now limited code.  Bravery Ketcham,MD 8:34 AM

## 2014-12-14 DIAGNOSIS — M79606 Pain in leg, unspecified: Secondary | ICD-10-CM

## 2014-12-14 LAB — PROTIME-INR
INR: 2.51 — ABNORMAL HIGH (ref 0.00–1.49)
PROTHROMBIN TIME: 26.8 s — AB (ref 11.6–15.2)

## 2014-12-14 LAB — BASIC METABOLIC PANEL
Anion gap: 13 (ref 5–15)
BUN: 94 mg/dL — AB (ref 6–20)
CO2: 33 mmol/L — ABNORMAL HIGH (ref 22–32)
CREATININE: 2.48 mg/dL — AB (ref 0.61–1.24)
Calcium: 8.8 mg/dL — ABNORMAL LOW (ref 8.9–10.3)
Chloride: 87 mmol/L — ABNORMAL LOW (ref 101–111)
GFR, EST AFRICAN AMERICAN: 28 mL/min — AB (ref >60)
GFR, EST NON AFRICAN AMERICAN: 24 mL/min — AB (ref >60)
GLUCOSE: 97 mg/dL (ref 65–99)
POTASSIUM: 3.8 mmol/L (ref 3.5–5.1)
SODIUM: 133 mmol/L — AB (ref 135–145)

## 2014-12-14 MED ORDER — WARFARIN SODIUM 6 MG PO TABS
6.0000 mg | ORAL_TABLET | ORAL | Status: DC
  Administered 2014-12-15: 6 mg via ORAL
  Filled 2014-12-14: qty 1

## 2014-12-14 MED ORDER — WARFARIN SODIUM 3 MG PO TABS
3.0000 mg | ORAL_TABLET | ORAL | Status: DC
  Administered 2014-12-14: 3 mg via ORAL
  Filled 2014-12-14 (×2): qty 1

## 2014-12-14 MED ORDER — TORSEMIDE 20 MG PO TABS
20.0000 mg | ORAL_TABLET | Freq: Two times a day (BID) | ORAL | Status: DC
  Administered 2014-12-14 – 2014-12-15 (×2): 20 mg via ORAL
  Filled 2014-12-14 (×4): qty 1

## 2014-12-14 MED ORDER — WARFARIN SODIUM 3 MG PO TABS
3.0000 mg | ORAL_TABLET | Freq: Once | ORAL | Status: DC
  Filled 2014-12-14: qty 1

## 2014-12-14 NOTE — Progress Notes (Signed)
ANTICOAGULATION CONSULT NOTE - Follow Up Consult  Pharmacy Consult for Coumadin Indication: h/o CVA  Allergies  Allergen Reactions  . Codeine Phosphate Nausea And Vomiting    Patient Measurements: Height: 5' 11.5" (181.6 cm) Weight: 147 lb 14.9 oz (67.1 kg) IBW/kg (Calculated) : 76.45 Heparin Dosing Weight:    Vital Signs: Temp: 98.1 F (36.7 C) (06/19 0616) Temp Source: Oral (06/19 0616) BP: 96/60 mmHg (06/19 0616) Pulse Rate: 75 (06/19 0616)  Labs:  Recent Labs  12/12/14 0308 12/13/14 0529 12/14/14 0521  LABPROT 29.1* 26.7* 26.8*  INR 2.81* 2.50* 2.51*  CREATININE 2.88* 2.78* 2.48*    Estimated Creatinine Clearance: 24.8 mL/min (by C-G formula based on Cr of 2.48).   Assessment: CC:  CHF exacerbation- increasing LE edema, orthopnea, fatigue & chronically SOB.  Renal fx slight bump - backing off on diuresis today.  Anticoagulation: Warfarin PTA for hx CVA (no Hx AFib).  INR 2.51.  Had a few extra doses of 6 mg this week.  No bleeding or complications noted. PTA dose 3 mg daily EXCEPT for 6 mg on Mondays only  Goal of Therapy:  INR 2-3 Monitor platelets by anticoagulation protocol: Yes   Plan:  Warfarin 3 mg daily except 6 mg on Monday (Home dose)  Daily PT/INR. If PT/INR remains stable after Monday dose. Switch to Q 72 hr monitoring   Vinnie Level, PharmD., BCPS Clinical Pharmacist Pager [phone]

## 2014-12-14 NOTE — Progress Notes (Signed)
Patient ID: Clinton [name], male   DOB: 10-12-1940, 74 y.o.   MRN: [id number] Advanced Heart Failure Team Progress Note   Subjective   Clinton [name] is a 74 y.o. male with a hx of CAD s/p CABG, PAD s/p Ao-bifem bypass and Ao-R renal bypass, carotid stenosis s/p bilateral CEA, LICA and L CCA stenting, HTN, HL, ICM, systolic HF, aortic stenosis, hypothyroidism, prior CVA, CKD. PAD is followed by [facility].Admitted with low output HF, worsening renal function and marked volume overload.    Echo 12/04/14 - 30-35%, global hypokinesis with paradoxical septal motion. Grade 2 diastolic dysfunction. Moderate AS. Complete report below.    (similar to echo 07/10/14, EF unchanged) - felt to have severe low gradient AS  IV lasix and metolazone stopped 6/17. Milrinone cut back on 6/19. Diuretics on hold. Weight stable. Creatinine back to 2.4. Feels ok. Only complaint is chronic leg pain.  Seen by PT and walked 340 feet with rolling walker. Recommends home PT.  Objective:    Vital Signs:   Temp:  [97.8 F (36.6 C)-98.8 F (37.1 C)] 98.1 F (36.7 C) (06/19 0616) Pulse Rate:  [75-82] 75 (06/19 0616) Resp:  [18] 18 (06/19 0616) BP: (96-120)/(58-60) 96/60 mmHg (06/19 0616) SpO2:  [95 %-98 %] 98 % (06/19 0616) Weight:  [67.1 kg (147 lb 14.9 oz)] 67.1 kg (147 lb 14.9 oz) (06/19 0616) Last BM Date: 12/13/14  Weight change: Filed Weights   12/12/14 0445 12/13/14 0442 12/14/14 0616  Weight: 67.631 kg (149 lb 1.6 oz) 66.588 kg (146 lb 12.8 oz) 67.1 kg (147 lb 14.9 oz)    Intake/Output:   Intake/Output Summary (Last 24 hours) at 12/14/14 1220 Last data filed at 12/14/14 0815  Gross per 24 hour  Intake   1410 ml  Output    775 ml  Net    635 ml     Physical Exam: General:  Elderly. Sitting in chair  NAD. HEENT: normal Neck: supple. JVP 9 cm. Carotids 2+ bilat; bilateral bruits, hx of CEA. No lymphadenopathy or thryomegaly appreciated. Cor: PMI nondisplaced. Regular rate & rhythm. 2/6 AS with  moderately depressed S2, 2/6 MR Lungs: Bibasilar diminished  Abdomen: nontender.  non distended. Midline hernia present, easily reducible, non tender. No hepatosplenomegaly. No bruits or masses. Good bowel sounds. Extremities: no cyanosis, clubbing, rash. 1+ edema  Neuro: alert & orientedx3, cranial nerves grossly intact. moves all 4 extremities w/o difficulty. Affect pleasant  Telemetry: NSR 70s-80s with PVCs.   Labs: Basic Metabolic Panel:  Recent Labs Lab 12/08/14 1715  12/10/14 0242 12/11/14 0527 12/12/14 0308 12/13/14 0529 12/14/14 0521  NA 132*  < > 131* 131* 131* 132* 133*  K 4.7  < > 4.6 4.1 4.2 3.9 3.8  CL 89*  < > 87* 84* 84* 83* 87*  CO2 28  < > 31 31 31  33* 33*  GLUCOSE 116*  < > 113* 173* 205* 101* 97  BUN 79*  < > 85* 91* 98* 101* 94*  CREATININE 2.75*  < > 2.76* 2.79* 2.88* 2.78* 2.48*  CALCIUM 8.6*  < > 8.7* 8.7* 8.7* 8.6* 8.8*  MG 2.6*  --   --   --   --   --   --   < > = values in this interval not displayed.  Liver Function Tests: No results for input(s): AST, ALT, ALKPHOS, BILITOT, PROT, ALBUMIN in the last 168 hours. No results for input(s): LIPASE, AMYLASE in the last 168 hours. No results for input(s):  AMMONIA in the last 168 hours.  CBC:  Recent Labs Lab 12/08/14 0350 12/10/14 1155  WBC 7.4 11.4*  HGB 9.6* 10.8*  HCT 30.9* 35.0*  MCV 74.5* 76.6*  PLT 151 173    Cardiac Enzymes: No results for input(s): CKTOTAL, CKMB, CKMBINDEX, TROPONINI in the last 168 hours.  BNP: BNP (last 3 results)  Recent Labs  12/03/14 1909  BNP >4500.0*    ProBNP (last 3 results)  Recent Labs  05/10/14 0935  PROBNP 63888.0*     CBG: No results for input(s): GLUCAP in the last 168 hours.  Coagulation Studies:  Recent Labs  12/12/14 0308 12/13/14 0529 12/14/14 0521  LABPROT 29.1* 26.7* 26.8*  INR 2.81* 2.50* 2.51*    Other results:   Imaging: No results found.  Latest Echo  12/04/14 LV EF: 30% -   35%  ------------------------------------------------------------------- Indications:   Aortic stenosis 424.1.  ------------------------------------------------------------------- History:  Risk factors: Chronic kidney disease. Dilated Cardiomyopathy. Leg swelling.  ------------------------------------------------------------------- Study Conclusions  - Left ventricle: The cavity size was normal. There was moderate concentric hypertrophy. Systolic function was moderately to severely reduced. The estimated ejection fraction was in the range of 30% to 35%. Wall motion was normal; there were no regional wall motion abnormalities. Features are consistent with a pseudonormal left ventricular filling pattern, with concomitant abnormal relaxation and increased filling pressure (grade 2 diastolic dysfunction). Doppler parameters are consistent with elevated ventricular end-diastolic filling pressure. - Ventricular septum: Septal motion showed paradox. The contour showed diastolic flattening and systolic flattening. - Aortic valve: Valve mobility was restricted. There was moderate stenosis. There was trivial regurgitation. Valve area (VTI): 0.99 cm^2. Valve area (Vmax): 0.87 cm^2. Valve area (Vmean): 0.85 cm^2. - Aortic root: The aortic root was normal in size. - Mitral valve: There was moderate regurgitation. Valve area by pressure half-time: 2.12 cm^2. Valve area by continuity equation (using LVOT flow): 1.32 cm^2. - Left atrium: The atrium was moderately dilated. - Right ventricle: The cavity size was mildly dilated. Wall thickness was normal. Pacer wire or catheter noted in right ventricle. Systolic function was mildly reduced. - Right atrium: The atrium was moderately dilated. Pacer wire or catheter noted in right atrium. - Tricuspid valve: There was severe regurgitation. - Pulmonic valve: There was mild regurgitation. - Pulmonary arteries:  Systolic pressure was severely increased. PA peak pressure: 67 mm Hg (S).  Impressions:  - LVEF is 30-35%, there is global hypokinesis with paradoxical septal motion. Grade 2 diastolic dysfunction with severely elevated filling pressures. There is systolic and diastolic septal flattening consistent with RV pressure and volume overload. RV is mildly dilated with mild systolic dysfunction. There is severe pulmonary hypertension.  Medications:     Current Medications: . carvedilol  3.125 mg Oral BID WC  . dextromethorphan-guaiFENesin  1 tablet Oral BID  . ezetimibe  10 mg Oral Daily  . feeding supplement (ENSURE ENLIVE)  237 mL Oral BID BM  . hydrALAZINE  12.5 mg Oral 3 times per day  . isosorbide mononitrate  30 mg Oral Daily  . levothyroxine  75 mcg Oral QAC breakfast  . rosuvastatin  40 mg Oral Daily  . sodium chloride  3 mL Intravenous Q12H  . warfarin  3 mg Oral Once per day on Sun Tue Wed Thu Fri Sat  . [START ON 12/15/2014] warfarin  6 mg Oral Once per day on Mon  . Warfarin - Pharmacist Dosing Inpatient   Does not apply q1800    Infusions: . milrinone 0.125 mcg/kg/min (  12/12/14 2218)     Assessment:   1. Acute on chronic systolic and diastolic CHF, NYHA Class IV: Ischemic cardiomyopathy. EF 30-35%.  He has declined ICD in the past.  2. Acute on CKD stage 4, GFR 15-29: monitor closely with diuresis. 4. Severe, low-gradient AS 5. CAD, hx of CABG: crestor  6. HTN: on hydralazine/Imdur 7. History of stroke: on Coumadin per pharm.  Consult 6/14 8. PAD 9. Acute on chronic respiratory failure 10. Ascites 11. Microcytic anemia - improved with feraheme 12. Acute gout - completed course of prednisone.  Volume status much improved. Now off diuretics. Will stop milrinone. Creatinine improved. Start torsemide today. Continue hydralazine. Not candidate for AVR. Now limited code.  Possibly home in 24-48 hours with HHPT. Unclear etiology for chronic LE  pain. Check ABIs.  Bensimhon, Daniel,MD 12:20 PM

## 2014-12-15 ENCOUNTER — Inpatient Hospital Stay (HOSPITAL_COMMUNITY): Payer: Medicare HMO

## 2014-12-15 DIAGNOSIS — M79606 Pain in leg, unspecified: Secondary | ICD-10-CM

## 2014-12-15 LAB — BASIC METABOLIC PANEL
ANION GAP: 13 (ref 5–15)
BUN: 91 mg/dL — ABNORMAL HIGH (ref 6–20)
CALCIUM: 8.5 mg/dL — AB (ref 8.9–10.3)
CO2: 33 mmol/L — ABNORMAL HIGH (ref 22–32)
Chloride: 86 mmol/L — ABNORMAL LOW (ref 101–111)
Creatinine, Ser: 2.51 mg/dL — ABNORMAL HIGH (ref 0.61–1.24)
GFR, EST AFRICAN AMERICAN: 27 mL/min — AB (ref >60)
GFR, EST NON AFRICAN AMERICAN: 24 mL/min — AB (ref >60)
Glucose, Bld: 125 mg/dL — ABNORMAL HIGH (ref 65–99)
Potassium: 3.6 mmol/L (ref 3.5–5.1)
SODIUM: 132 mmol/L — AB (ref 135–145)

## 2014-12-15 LAB — PROTIME-INR
INR: 2.54 — AB (ref 0.00–1.49)
PROTHROMBIN TIME: 27 s — AB (ref 11.6–15.2)

## 2014-12-15 MED ORDER — POTASSIUM CHLORIDE CRYS ER 20 MEQ PO TBCR
20.0000 meq | EXTENDED_RELEASE_TABLET | Freq: Once | ORAL | Status: AC
  Administered 2014-12-15: 20 meq via ORAL
  Filled 2014-12-15: qty 1

## 2014-12-15 MED ORDER — TORSEMIDE 20 MG PO TABS
40.0000 mg | ORAL_TABLET | Freq: Every day | ORAL | Status: DC
  Administered 2014-12-16: 40 mg via ORAL
  Filled 2014-12-15 (×2): qty 2

## 2014-12-15 MED ORDER — TORSEMIDE 20 MG PO TABS
40.0000 mg | ORAL_TABLET | Freq: Two times a day (BID) | ORAL | Status: DC
  Filled 2014-12-15 (×2): qty 2

## 2014-12-15 MED ORDER — TORSEMIDE 20 MG PO TABS
20.0000 mg | ORAL_TABLET | ORAL | Status: AC
  Administered 2014-12-15: 20 mg via ORAL
  Filled 2014-12-15: qty 1

## 2014-12-15 MED ORDER — TRAMADOL HCL 50 MG PO TABS
50.0000 mg | ORAL_TABLET | Freq: Three times a day (TID) | ORAL | Status: DC
  Administered 2014-12-15 – 2014-12-16 (×3): 50 mg via ORAL
  Filled 2014-12-15 (×3): qty 1

## 2014-12-15 MED ORDER — TORSEMIDE 20 MG PO TABS
20.0000 mg | ORAL_TABLET | Freq: Every day | ORAL | Status: DC
  Administered 2014-12-15: 20 mg via ORAL
  Filled 2014-12-15 (×2): qty 1

## 2014-12-15 NOTE — Progress Notes (Signed)
Patient ID: Clinton [name], male   DOB: [birth year], 74 y.o.   MRN: [id number] Advanced Heart Failure Team Progress Note   Subjective   Clinton [name] is a 74 y.o. male with a hx of CAD s/p CABG, PAD s/p Ao-bifem bypass and Ao-R renal bypass, carotid stenosis s/p bilateral CEA, LICA and L CCA stenting, HTN, HL, ICM, systolic HF, aortic stenosis, hypothyroidism, prior CVA, CKD. PAD is followed by [facility].Admitted with low output HF, worsening renal function and marked volume overload.    Echo 12/04/14 - 30-35%, global hypokinesis with paradoxical septal motion. Grade 2 diastolic dysfunction. Moderate AS. Complete report below.    (similar to echo 07/10/14, EF unchanged) - felt to have severe low gradient AS  Off lasix and milrinone.   Complaining of leg pain. Denies SOB.   Objective:    Vital Signs:   Temp:  [97.4 F (36.3 C)-98.6 F (37 C)] 98.6 F (37 C) (06/20 0432) Pulse Rate:  [72-80] 80 (06/20 0432) Resp:  [18-20] 18 (06/20 0432) BP: (90-112)/(52-61) 106/61 mmHg (06/20 0432) SpO2:  [96 %-97 %] 96 % (06/20 0432) Weight:  [148 lb 14.4 oz (67.541 kg)] 148 lb 14.4 oz (67.541 kg) (06/20 0432) Last BM Date: 12/14/14  Weight change: Filed Weights   12/13/14 0442 12/14/14 0616 12/15/14 0432  Weight: 146 lb 12.8 oz (66.588 kg) 147 lb 14.9 oz (67.1 kg) 148 lb 14.4 oz (67.541 kg)    Intake/Output:   Intake/Output Summary (Last 24 hours) at 12/15/14 1028 Last data filed at 12/15/14 1019  Gross per 24 hour  Intake   1140 ml  Output   1000 ml  Net    140 ml     Physical Exam: General:  Elderly. Sitting in chair  NAD. HEENT: normal Neck: supple. JVP 7-8 cm. Carotids 2+ bilat; bilateral bruits, hx of CEA. No lymphadenopathy or thryomegaly appreciated. Cor: PMI nondisplaced. Regular rate & rhythm. 2/6 AS with moderately depressed S2, 2/6 MR Lungs: Bibasilar diminished  Abdomen: nontender.  non distended. Midline hernia present, easily reducible, non tender. No hepatosplenomegaly.  No bruits or masses. Good bowel sounds. Extremities: no cyanosis, clubbing, rash. 1+ edema  Neuro: alert & orientedx3, cranial nerves grossly intact. moves all 4 extremities w/o difficulty. Affect pleasant  Telemetry: NSR 70s-80s with PVCs.   Labs: Basic Metabolic Panel:  Recent Labs Lab 12/08/14 1715  12/11/14 0527 12/12/14 0308 12/13/14 0529 12/14/14 0521 12/15/14 0400  NA 132*  < > 131* 131* 132* 133* 132*  K 4.7  < > 4.1 4.2 3.9 3.8 3.6  CL 89*  < > 84* 84* 83* 87* 86*  CO2 28  < > 31 31 33* 33* 33*  GLUCOSE 116*  < > 173* 205* 101* 97 125*  BUN 79*  < > 91* 98* 101* 94* 91*  CREATININE 2.75*  < > 2.79* 2.88* 2.78* 2.48* 2.51*  CALCIUM 8.6*  < > 8.7* 8.7* 8.6* 8.8* 8.5*  MG 2.6*  --   --   --   --   --   --   < > = values in this interval not displayed.  Liver Function Tests: No results for input(s): AST, ALT, ALKPHOS, BILITOT, PROT, ALBUMIN in the last 168 hours. No results for input(s): LIPASE, AMYLASE in the last 168 hours. No results for input(s): AMMONIA in the last 168 hours.  CBC:  Recent Labs Lab 12/10/14 1155  WBC 11.4*  HGB 10.8*  HCT 35.0*  MCV 76.6*  PLT 173  Cardiac Enzymes: No results for input(s): CKTOTAL, CKMB, CKMBINDEX, TROPONINI in the last 168 hours.  BNP: BNP (last 3 results)  Recent Labs  12/03/14 1909  BNP >4500.0*    ProBNP (last 3 results)  Recent Labs  05/10/14 0935  PROBNP 63888.0*     CBG: No results for input(s): GLUCAP in the last 168 hours.  Coagulation Studies:  Recent Labs  12/13/14 0529 12/14/14 0521 12/15/14 0400  LABPROT 26.7* 26.8* 27.0*  INR 2.50* 2.51* 2.54*    Other results:   Imaging: No results found.  Latest Echo  12/04/14 LV EF: 30% -  35%  ------------------------------------------------------------------- Indications:   Aortic stenosis 424.1.  ------------------------------------------------------------------- History:  Risk factors: Chronic kidney disease.  Dilated Cardiomyopathy. Leg swelling.  ------------------------------------------------------------------- Study Conclusions  - Left ventricle: The cavity size was normal. There was moderate concentric hypertrophy. Systolic function was moderately to severely reduced. The estimated ejection fraction was in the range of 30% to 35%. Wall motion was normal; there were no regional wall motion abnormalities. Features are consistent with a pseudonormal left ventricular filling pattern, with concomitant abnormal relaxation and increased filling pressure (grade 2 diastolic dysfunction). Doppler parameters are consistent with elevated ventricular end-diastolic filling pressure. - Ventricular septum: Septal motion showed paradox. The contour showed diastolic flattening and systolic flattening. - Aortic valve: Valve mobility was restricted. There was moderate stenosis. There was trivial regurgitation. Valve area (VTI): 0.99 cm^2. Valve area (Vmax): 0.87 cm^2. Valve area (Vmean): 0.85 cm^2. - Aortic root: The aortic root was normal in size. - Mitral valve: There was moderate regurgitation. Valve area by pressure half-time: 2.12 cm^2. Valve area by continuity equation (using LVOT flow): 1.32 cm^2. - Left atrium: The atrium was moderately dilated. - Right ventricle: The cavity size was mildly dilated. Wall thickness was normal. Pacer wire or catheter noted in right ventricle. Systolic function was mildly reduced. - Right atrium: The atrium was moderately dilated. Pacer wire or catheter noted in right atrium. - Tricuspid valve: There was severe regurgitation. - Pulmonic valve: There was mild regurgitation. - Pulmonary arteries: Systolic pressure was severely increased. PA peak pressure: 67 mm Hg (S).  Impressions:  - LVEF is 30-35%, there is global hypokinesis with paradoxical septal motion. Grade 2 diastolic dysfunction with severely elevated  filling pressures. There is systolic and diastolic septal flattening consistent with RV pressure and volume overload. RV is mildly dilated with mild systolic dysfunction. There is severe pulmonary hypertension.  Medications:     Current Medications: . carvedilol  3.125 mg Oral BID WC  . dextromethorphan-guaiFENesin  1 tablet Oral BID  . ezetimibe  10 mg Oral Daily  . feeding supplement (ENSURE ENLIVE)  237 mL Oral BID BM  . hydrALAZINE  12.5 mg Oral 3 times per day  . isosorbide mononitrate  30 mg Oral Daily  . levothyroxine  75 mcg Oral QAC breakfast  . rosuvastatin  40 mg Oral Daily  . sodium chloride  3 mL Intravenous Q12H  . torsemide  20 mg Oral BID  . warfarin  3 mg Oral Once per day on Sun Tue Wed Thu Fri Sat  . warfarin  6 mg Oral Once per day on Mon  . Warfarin - Pharmacist Dosing Inpatient   Does not apply q1800    Infusions:     Assessment:   1. Acute on chronic systolic and diastolic CHF, NYHA Class IV: Ischemic cardiomyopathy. EF 30-35%.  He has declined ICD in the past.  2. Acute on CKD stage 4, GFR  15-29: monitor closely with diuresis. 4. Severe, low-gradient AS 5. CAD, hx of CABG: crestor  6. HTN: on hydralazine/Imdur 7. History of stroke: on Coumadin per pharm.  Consult 6/14 8. PAD 9. Acute on chronic respiratory failure 10. Ascites 11. Microcytic anemia - improved with feraheme 12. Acute gout - completed course of prednisone.  Volume status starting to trend up. Increase torsemide to 40 mg in am and continue 20 mg in pm. Add 20 meq potassium daily. Continue hydralazine. Not candidate for AVR. Now limited code.  HH ordered. HHPT ordered. SW meeting with him today. ? Assisted Living.   Unclear etiology for chronic LE pain. ABI pending.   CLEGG,AMY, NP-C  10:28 AM   Patient seen and examined with Tonye Becket, NP. We discussed all aspects of the encounter. I agree with the assessment and plan as stated above.   Volume status going up  slightly. Increase demadex to 40/20. For ABIs today. Does not qualify for SNF. SW and CM helping [place] with disposition. Medical management for HF and AS.   Peggie Hornak,MD 12:46 PM

## 2014-12-15 NOTE — Progress Notes (Signed)
Physical Therapy Treatment Patient Details Name: Clinton [name] MRN: [id number] DOB: 1940-12-22 Today's Date: 12/15/2014    History of Present Illness Clinton [name] is a 74 y.o. male with a hx of CAD s/p CABG, PAD s/p Ao-bifem bypass and Ao-R renal bypass, carotid stenosis s/p bilateral CEA, LICA and L CCA stenting, HTN, HL, ICM, systolic HF, aortic stenosis, hypothyroidism, prior CVA, CKD. PAD is followed by [facility]. He was seen by Dr. Thompson Grayer in 2012 and declined ICD implantation. Admitted in 16/57 for a/c systolic HF. Recent CT showed pulmonary nodules and is followed by pulmonary. Has had increasing SOB and fatigue.    PT Comments    Pt moving well with report of no pain in leg other than a cramp behind his right knee. Pt moving well with transfers and gait and able to increase gait distance today. Recommend rail for stairs at home and continued HEP. Also discussed exercise and walking program for home as well as healthy eating restrictions. Will follow to maximize independence.   Follow Up Recommendations  Home health PT     Equipment Recommendations       Recommendations for Other Services       Precautions / Restrictions Precautions Precautions: Fall    Mobility  Bed Mobility               General bed mobility comments: in chair on arrival  Transfers Overall transfer level: Modified independent                  Ambulation/Gait Ambulation/Gait assistance: Modified independent (Device/Increase time) Ambulation Distance (Feet): 425 Feet Assistive device: Rolling walker (2 wheeled) Gait Pattern/deviations: Step-through pattern;Decreased stride length   Gait velocity interpretation: Below normal speed for age/gender General Gait Details: good use of RW with steady gait and 2 standing rests   Stairs Stairs: Yes Stairs assistance: Modified independent (Device/Increase time) Stair Management: One rail Left;Alternating pattern;Forwards Number  of Stairs: 3 General stair comments: recommend rail installation for home  Wheelchair Mobility    Modified Rankin (Stroke Patients Only)       Balance     Sitting balance-Leahy Scale: Good       Standing balance-Leahy Scale: Fair                      Cognition Arousal/Alertness: Awake/alert Behavior During Therapy: WFL for tasks assessed/performed Overall Cognitive Status: Within Functional Limits for tasks assessed                      Exercises General Exercises - Lower Extremity Long Arc Quad: AROM;Seated;Both;20 reps Hip ABduction/ADduction: AROM;Seated;Both;20 reps Hip Flexion/Marching: AROM;Seated;Both;20 reps    General Comments        Pertinent Vitals/Pain Pain Assessment: No/denies pain  HR 81 with gait sats 90% with gait on RA    Home Living                      Prior Function            PT Goals (current goals can now be found in the care plan section) Progress towards PT goals: Goals met and updated - see care plan    Frequency       PT Plan Current plan remains appropriate    Co-evaluation             End of Session   Activity Tolerance: Patient tolerated treatment well Patient left: in chair;with  call bell/phone within reach     Time: 1300-1320 PT Time Calculation (min) (ACUTE ONLY): 20 min  Charges:  $Gait Training: 8-22 mins                    G Codes:      Melford Aase 2014-12-28, 2:19 PM Elwyn Reach, [place]

## 2014-12-15 NOTE — Progress Notes (Signed)
VASCULAR LAB PRELIMINARY  ARTERIAL  ABI completed:ABIs are within normal limits with abnormal waveforms.    RIGHT    LEFT    PRESSURE WAVEFORM  PRESSURE WAVEFORM  BRACHIAL 127 B BRACHIAL 122 B  DP   DP    AT 124 M AT 100 M  PT 110 M PT 131 M  PER   PER    GREAT TOE  NA GREAT TOE  NA    RIGHT LEFT  ABI 0.98 1.03     Lorree Millar, RVT 12/15/2014, 11:13 AM

## 2014-12-15 NOTE — Progress Notes (Signed)
ANTICOAGULATION CONSULT NOTE - Follow Up Consult  Pharmacy Consult for Coumadin Indication: h/o CVA  Allergies  Allergen Reactions  . Codeine Phosphate Nausea And Vomiting    Patient Measurements: Height: 5' 11.5" (181.6 cm) Weight: 148 lb 14.4 oz (67.541 kg) IBW/kg (Calculated) : 76.45 Heparin Dosing Weight:    Vital Signs: Temp: 98.6 F (37 C) (06/20 0432) Temp Source: Oral (06/20 0432) BP: 106/61 mmHg (06/20 0432) Pulse Rate: 80 (06/20 0432)  Labs:  Recent Labs  12/13/14 0529 12/14/14 0521 12/15/14 0400  LABPROT 26.7* 26.8* 27.0*  INR 2.50* 2.51* 2.54*  CREATININE 2.78* 2.48* 2.51*    Estimated Creatinine Clearance: 24.7 mL/min (by C-G formula based on Cr of 2.51).   Assessment: CC:  CHF exacerbation- increasing LE edema, orthopnea, fatigue & chronically SOB.  Renal fx slight bump - backed off on diuresis now stable plan d/c home tomorrow.  Anticoagulation: Warfarin PTA for hx CVA (no Hx AFib).  INR 2.54.  Had a few extra doses of 6 mg last week.  No bleeding or complications noted.  Now seems to be holding stable PTA dose 3 mg daily EXCEPT for 6 mg on Mondays only  Goal of Therapy:  INR 2-3 Monitor platelets by anticoagulation protocol: Yes   Plan:  Warfarin 3 mg daily except 6 mg on Monday (Home dose)  Daily PT/INR   Leota Sauers Pharm.D. CPP, BCPS Clinical Pharmacist [phone] 12/15/2014 10:46 AM

## 2014-12-15 NOTE — Progress Notes (Signed)
UR COMPLETED  

## 2014-12-15 NOTE — Progress Notes (Signed)
Chaplain responded to spiritual care consult for advanced directive. Chaplain and pt worked to fill out Scientist, water quality. Chaplain working on Librarian, academic and witnesses for completion.   12/15/14 1200  Clinical Encounter Type  Visited With Patient and family together  Visit Type Spiritual support  Referral From Nurse  Spiritual Encounters  Spiritual Needs Prayer  Stress Factors  Patient Stress Factors Major life changes  Yennifer Segovia, Mayer Masker, Chaplain 12/15/2014 12:03 PM

## 2014-12-15 NOTE — Progress Notes (Signed)
Long discussion with patient son and ex spouse about DCP; Son wants patient to go to a facility but patient is ambulating 350 ft min guard assist and per physical therapy eval recommendation - home health care;CM explained to the pt/ family how insurance work/ the qualification for skilled placement vs custodial care ( not a covered benefit). Patient does not qualify to go to a SNF - no skilled need and [facility] will not cover the stay unless the pat/ family wants to privately pay.  CM can arrange HHC services - HHPT/ OT/ RN./ nurses aide/ SW also can give the pt/ family a list or private sitters.   Lupita Leash Soc Worker to talk to family members also about disposition. Abelino Derrick RN,BSN,MHA [phone]

## 2014-12-15 NOTE — Progress Notes (Signed)
Chaplain facilitated completion of advanced directive. Copies produced upon pt request. Chaplain also offered emotional support and prayer. Page chaplain as needed.    12/15/14 1400  Clinical Encounter Type  Visited With Patient  Visit Type Follow-up;Spiritual support  Spiritual Encounters  Spiritual Needs Prayer;Emotional  Stress Factors  Patient Stress Factors Major life changes  Advance Directives (For Healthcare)  Does patient have an advance directive? Yes  Type of Estate agent of [place];Living will  Copy of advanced directive(s) in chart? No - copy requested  Jiles Harold 12/15/2014 2:40 PM

## 2014-12-16 ENCOUNTER — Encounter (HOSPITAL_COMMUNITY): Payer: Self-pay | Admitting: Physician Assistant

## 2014-12-16 LAB — BASIC METABOLIC PANEL
ANION GAP: 15 (ref 5–15)
BUN: 98 mg/dL — ABNORMAL HIGH (ref 6–20)
CO2: 30 mmol/L (ref 22–32)
Calcium: 9 mg/dL (ref 8.9–10.3)
Chloride: 88 mmol/L — ABNORMAL LOW (ref 101–111)
Creatinine, Ser: 2.66 mg/dL — ABNORMAL HIGH (ref 0.61–1.24)
GFR calc Af Amer: 26 mL/min — ABNORMAL LOW (ref >60)
GFR calc non Af Amer: 22 mL/min — ABNORMAL LOW (ref >60)
GLUCOSE: 104 mg/dL — AB (ref 65–99)
Potassium: 4.4 mmol/L (ref 3.5–5.1)
Sodium: 133 mmol/L — ABNORMAL LOW (ref 135–145)

## 2014-12-16 LAB — CBC
HEMATOCRIT: 36.7 % — AB (ref 39.0–52.0)
Hemoglobin: 11.4 g/dL — ABNORMAL LOW (ref 13.0–17.0)
MCH: 24.6 pg — AB (ref 26.0–34.0)
MCHC: 31.1 g/dL (ref 30.0–36.0)
MCV: 79.1 fL (ref 78.0–100.0)
Platelets: 193 10*3/uL (ref 150–400)
RBC: 4.64 MIL/uL (ref 4.22–5.81)
RDW: 20.8 % — ABNORMAL HIGH (ref 11.5–15.5)
WBC: 10.6 10*3/uL — AB (ref 4.0–10.5)

## 2014-12-16 LAB — PROTIME-INR
INR: 2.62 — AB (ref 0.00–1.49)
PROTHROMBIN TIME: 27.6 s — AB (ref 11.6–15.2)

## 2014-12-16 MED ORDER — HYDRALAZINE HCL 25 MG PO TABS: 12.5000 mg | ORAL_TABLET | Freq: Three times a day (TID) | ORAL | Status: DC

## 2014-12-16 MED ORDER — ISOSORBIDE MONONITRATE ER 30 MG PO TB24: 30.0000 mg | ORAL_TABLET | Freq: Every day | ORAL | Status: DC

## 2014-12-16 MED ORDER — TORSEMIDE 20 MG PO TABS: 40.0000 mg | ORAL_TABLET | Freq: Two times a day (BID) | ORAL | Status: DC

## 2014-12-16 MED ORDER — TRAMADOL HCL 50 MG PO TABS: 50.0000 mg | ORAL_TABLET | Freq: Two times a day (BID) | ORAL | Status: DC | PRN

## 2014-12-16 MED ORDER — CARVEDILOL 3.125 MG PO TABS: 3.1250 mg | ORAL_TABLET | Freq: Two times a day (BID) | ORAL | Status: DC

## 2014-12-16 MED ORDER — WARFARIN SODIUM 6 MG PO TABS: ORAL_TABLET | ORAL | Status: DC

## 2014-12-16 NOTE — Discharge Instructions (Addendum)
HHRN to do BMET and INR Friday 06/24, results of BMET to Dr Gala Romney. Results of INR to coumadin clinic.  Information on my medicine - Coumadin   (Warfarin)  This medication education was reviewed with me or my healthcare representative as part of my discharge preparation.    Why was Coumadin prescribed for you? Coumadin was prescribed for you because you have a blood clot or a medical condition that can cause an increased risk of forming blood clots. Blood clots can cause serious health problems by blocking the flow of blood to the heart, lung, or brain. Coumadin can prevent harmful blood clots from forming. As a reminder your indication for Coumadin is:   Stroke Prevention Because Of Atrial Fibrillation  What test will check on my response to Coumadin? While on Coumadin (warfarin) you will need to have an INR test regularly to ensure that your dose is keeping you in the desired range. The INR (international normalized ratio) number is calculated from the result of the laboratory test called prothrombin time (PT).  If an INR APPOINTMENT HAS NOT ALREADY BEEN MADE FOR YOU please schedule an appointment to have this lab work done by your health care provider within 7 days. Your INR goal is usually a number between:  2 to 3  What  do you need to  know  About  COUMADIN? Take Coumadin (warfarin) exactly as prescribed by your healthcare provider about the same time each day.  DO NOT stop taking without talking to the doctor who prescribed the medication.  Stopping without other blood clot prevention medication to take the place of Coumadin may increase your risk of developing a new clot or stroke.  Get refills before you run out.  What do you do if you miss a dose? If you miss a dose, take it as soon as you remember on the same day then continue your regularly scheduled regimen the next day.  Do not take two doses of Coumadin at the same time.  Important Safety Information A possible side effect of  Coumadin (Warfarin) is an increased risk of bleeding. You should call your healthcare provider right away if you experience any of the following: ? Bleeding from an injury or your nose that does not stop. ? Unusual colored urine (red or dark brown) or unusual colored stools (red or black). ? Unusual bruising for unknown reasons. ? A serious fall or if you hit your head (even if there is no bleeding).  Some foods or medicines interact with Coumadin (warfarin) and might alter your response to warfarin. To help avoid this: ? Eat a balanced diet, maintaining a consistent amount of Vitamin K. ? Notify your provider about major diet changes you plan to make. ? Avoid alcohol or limit your intake to 1 drink for women and 2 drinks for men per day. (1 drink is 5 oz. wine, 12 oz. beer, or 1.5 oz. liquor.)  Make sure that ANY health care provider who prescribes medication for you knows that you are taking Coumadin (warfarin).  Also make sure the healthcare provider who is monitoring your Coumadin knows when you have started a new medication including herbals and non-prescription products.  Coumadin (Warfarin)  Major Drug Interactions  Increased Warfarin Effect Decreased Warfarin Effect  Alcohol (large quantities) Antibiotics (esp. Septra/Bactrim, Flagyl, Cipro) Amiodarone (Cordarone) Aspirin (ASA) Cimetidine (Tagamet) Megestrol (Megace) NSAIDs (ibuprofen, naproxen, etc.) Piroxicam (Feldene) Propafenone (Rythmol SR) Propranolol (Inderal) Isoniazid (INH) Posaconazole (Noxafil) Barbiturates (Phenobarbital) Carbamazepine (Tegretol) Chlordiazepoxide (Librium) Cholestyramine (Questran)  Oral Contraceptives °Rifampin °Sucralfate (Carafate) °Vitamin K  ° °Coumadin® (Warfarin) Major Herbal Interactions  °Increased Warfarin Effect Decreased Warfarin Effect  °Garlic °Ginseng °Ginkgo biloba Coenzyme Q10 °Green tea °St. John’s wort   ° °Coumadin® (Warfarin) FOOD Interactions  °Eat a consistent number of  servings per week of foods HIGH in Vitamin K °(1 serving = ½ cup)  °Collards (cooked, or boiled & drained) °Kale (cooked, or boiled & drained) °Mustard greens (cooked, or boiled & drained) °Parsley *serving size only = ¼ cup °Spinach (cooked, or boiled & drained) °Swiss chard (cooked, or boiled & drained) °Turnip greens (cooked, or boiled & drained)  °Eat a consistent number of servings per week of foods MEDIUM-HIGH in Vitamin K °(1 serving = 1 cup)  °Asparagus (cooked, or boiled & drained) °Broccoli (cooked, boiled & drained, or raw & chopped) °Brussel sprouts (cooked, or boiled & drained) *serving size only = ½ cup °Lettuce, raw (green leaf, endive, romaine) °Spinach, raw °Turnip greens, raw & chopped  ° °These websites have more information on Coumadin (warfarin):  www.coumadin.com; °www.ahrq.gov/consumer/coumadin.htm; ° ° °

## 2014-12-16 NOTE — Progress Notes (Signed)
[facility] called for rolling walker to be delivered to his room prior to discharging home today; Alexis Goodell [phone]

## 2014-12-16 NOTE — Progress Notes (Signed)
Pt sitting on side of bed and states he felt nauseous overnight for 10-15 minutes. States he no longer feels nauseous. Pt told to notify RN if needs nausea medicine or if is vomiting. Pt also states he will notify RN if he needs to get out of bed to bathroom or chair. Will continue to monitor. Huel Coventry, RN

## 2014-12-16 NOTE — Progress Notes (Addendum)
Patient ID: TAMARIUS ROSENFIELD, male DOB: 1940/12/23, 74 y.o. MRN: [id number] Advanced Heart Failure Rounding Note 12/16/2014   Subjective:    RADEN BYINGTON is a 74 y.o. male with a hx of CAD s/p CABG, PAD s/p Ao-bifem bypass and Ao-R renal bypass, carotid stenosis s/p bilateral CEA, LICA and L CCA stenting, HTN, HL, ICM, systolic HF, aortic stenosis, hypothyroidism, prior CVA, CKD. PAD is followed by [facility].Admitted with low output HF, worsening renal function and marked volume overload.   Echo 12/04/14 - 30-35%, global hypokinesis with paradoxical septal motion. Grade 2 diastolic dysfunction. Moderate AS. Complete report below. (similar to echo 07/10/14, EF unchanged) - felt to have severe low gradient AS  IV Lasix and milrinone d/c'd 06/17. Now on torsemide, no metolazone since 06/16  Complaining of leg pain. Denies SOB.   Objective:   Weight Range:  Vital Signs:   Temp:  [97.4 F (36.3 C)-98.4 F (36.9 C)] 97.4 F (36.3 C) (06/21 0522) Pulse Rate:  [65-81] 65 (06/21 1050) Resp:  [16-18] 16 (06/21 0522) BP: (115-126)/(60-74) 126/60 mmHg (06/21 1050) SpO2:  [90 %-98 %] 95 % (06/21 0522) Weight:  [150 lb (68.04 kg)] 150 lb (68.04 kg) (06/21 0522) Last BM Date: 12/15/14  Weight change: Filed Weights   12/14/14 0616 12/15/14 0432 12/16/14 0522  Weight: 147 lb 14.9 oz (67.1 kg) 148 lb 14.4 oz (67.541 kg) 150 lb (68.04 kg)   Weight change: 1 lb 1.6 oz (0.499 kg)  Intake/Output:   Intake/Output Summary (Last 24 hours) at 12/16/14 1224 Last data filed at 12/16/14 1154  Gross per 24 hour  Intake   1778 ml  Output   2075 ml  Net   -297 ml     Physical Exam: General:  Well appearing. No resp difficulty HEENT: normal Neck: supple. JVP 9 cm. Carotids 2+ bilat; no bruits. No lymphadenopathy or thryomegaly appreciated. Cor: PMI nondisplaced. Regular rate & rhythm. No rubs, gallops, 2/6 murmurs. Lungs:  Decreased BS bases Abdomen: Midline hernia present, easily reducible,  soft, nontender, nondistended. No hepatosplenomegaly. No bruits or masses. Good bowel sounds. Extremities: no cyanosis, clubbing, rash, trace edema Neuro: alert & orientedx3, cranial nerves grossly intact. moves all 4 extremities w/o difficulty. Affect pleasant  Telemetry: SR, had 8 bts VT 06/19  Labs: Basic Metabolic Panel:  Recent Labs Lab 12/12/14 0308 12/13/14 0529 12/14/14 0521 12/15/14 0400 12/16/14 0523  NA 131* 132* 133* 132* 133*  K 4.2 3.9 3.8 3.6 4.4  CL 84* 83* 87* 86* 88*  CO2 31 33* 33* 33* 30  GLUCOSE 205* 101* 97 125* 104*  BUN 98* 101* 94* 91* 98*  CREATININE 2.88* 2.78* 2.48* 2.51* 2.66*  CALCIUM 8.7* 8.6* 8.8* 8.5* 9.0    Liver Function Tests: No results for input(s): AST, ALT, ALKPHOS, BILITOT, PROT, ALBUMIN in the last 168 hours. No results for input(s): LIPASE, AMYLASE in the last 168 hours. No results for input(s): AMMONIA in the last 168 hours.  CBC:  Recent Labs Lab 12/10/14 1155 12/16/14 0523  WBC 11.4* 10.6*  HGB 10.8* 11.4*  HCT 35.0* 36.7*  MCV 76.6* 79.1  PLT 173 193    BNP: BNP (last 3 results)  Recent Labs  12/03/14 1909  BNP >4500.0*    ProBNP (last 3 results)  Recent Labs  05/10/14 0935  PROBNP 63888.0*    Medications:     Scheduled Medications: . carvedilol  3.125 mg Oral BID WC  . dextromethorphan-guaiFENesin  1 tablet Oral BID  . ezetimibe  10 mg Oral Daily  . feeding supplement (ENSURE ENLIVE)  237 mL Oral BID BM  . hydrALAZINE  12.5 mg Oral 3 times per day  . isosorbide mononitrate  30 mg Oral Daily  . levothyroxine  75 mcg Oral QAC breakfast  . rosuvastatin  40 mg Oral Daily  . sodium chloride  3 mL Intravenous Q12H  . torsemide  20 mg Oral q1800  . torsemide  40 mg Oral QAC breakfast  . traMADol  50 mg Oral 3 times per day  . warfarin  3 mg Oral Once per day on Sun Tue Wed Thu Fri Sat  . warfarin  6 mg Oral Once per day on Mon  . Warfarin - Pharmacist Dosing Inpatient   Does not apply q1800      Infusions:   None  PRN Medications:  sodium chloride, acetaminophen, ondansetron (ZOFRAN) IV, sodium chloride   Assessment:  1. Acute on chronic systolic and diastolic CHF, NYHA Class IV: Ischemic cardiomyopathy. EF 30-35%. He has declined ICD in the past.  2. Acute on CKD stage 4, GFR 15-29: monitor closely with diuresis. 4. Severe, low-gradient AS 5. CAD, hx of CABG: statin, BB, no ASA due to coumadin 6. HTN: on hydralazine/Imdur 7. History of stroke: on Coumadin per pharm. Consult 6/14 8. PAD 9. Acute on chronic respiratory failure 10. Ascites - minimal, no paracentesis per [place] 11. Microcytic anemia - improved with feraheme 12. Acute gout - completed course of prednisone.  Volume status starting to trend up.  Torsemide 40 mg in am and 20 mg in pm since 06/20. On 20 meq potassium daily. Continue hydralazine. Not candidate for AVR. Now limited code.  HH ordered. HHPT ordered. SW meeting with him today.  Assisted Living was cost-prohibitive, son will take him home.  Unclear etiology for chronic LE pain. ABI OK.    Plan/Discussion:    Plans are in place for pt to go home with son. He will need early f/u in clinic. HHRN can recheck BMET later this week and may be able to give IV/IM Lasix periodically if this will help.   Pt feels he is ready for d/c. If he is followed closely enough, may be able to keep him out of the hospital for a while.  MD advise d/c meds.    Length of Stay: 13 Leanna Battles 12/16/2014, 12:24 PM  Patient seen and examined with Theodore Demark, PA-C. We discussed all aspects of the encounter. I agree with the assessment and plan as stated above.   Feels better. Volume status trending up slowly. Will increase torsemide to 40 bid. Ok for d/c today. ABIs monophasic but not critical.   Needs f/u in HF Clinic soon. May need to consider home milrinone in future.   Home meds: Carvedilol 3.125 bid (dose reduced) Tramadol 50 bid  prn Hydralazine 12.5 tid Imdur 30 daily Torsemide 40 bid Crestor 40  Synthroid 75 mcg Warfarin  (stop lasix, zetia and kcl)  HF Clinic Appt Monday 6/27 130pm  Bensimhon, Daniel,MD 1:44 PM

## 2014-12-16 NOTE — Discharge Summary (Signed)
CARDIOLOGY DISCHARGE SUMMARY   Patient ID: Clinton [name] MRN: [id number] DOB/AGE: [birth year] 74 y.o.  Admit date: 12/03/2014 Discharge date: 12/16/2014  PCP: No PCP Per Patient Primary Cardiologist: Dr Jens Som  Primary Discharge Diagnosis:    Acute on chronic systolic and diastolic heart failure, NYHA class 3 - weight at discharge 150 lbs  Secondary Discharge Diagnosis:    TOBACCO ABUSE   Essential hypertension   CAD, ARTERY BYPASS GRAFT   Aortic stenosis   CKD (chronic kidney disease) stage 4, GFR 15-29 ml/min   Lower extremity edema   Palliative care encounter   Weakness generalized   DNR (do not resuscitate) discussion   Anemia, iron deficient  Consults: Dr. Deedra Ehrich, Dr. Jeri Modena, Dr Kathe Mariner Care, Physical Therapy  Procedures: Renal artery ultrasound, abdominal ultrasound, renal ultrasound, 2-D echocardiogram, ABIs, Unna boot application bilaterally  Hospital Course: Clinton [name] is a 74 y.o. male with a history of CABG, PAD s/p Ao-bifem bypass and Ao-R renal bypass, carotid stenosis s/p bilateral CEA, LICA and L CCA stenting, HTN, HL, ICM, systolic HF, aortic stenosis, hypothyroidism, prior CVA, and CKD.  He came to the hospital on the day of admission with increasing lower extremity edema, orthopnea and fatigue. He has chronic heart failure, class III but his symptoms had worsened and increasing his home Lasix dose was no benefit. He was admitted for further evaluation and treatment.  1. Acute on chronic systolic and diastolic CHF: He was diuresed with IV Lasix and initially did not lose very much weight and did not feel like he was urinating very much. CHF consult was called. He was seen by Dr. Gala Romney and a chest x-ray showed heart failure and bilateral pleural effusions. He admitted to dietary indiscretion with salt. He was felt to have low output CHF due to end-stage low-gradient aortic stenosis with cardiogenic shock and marked volume  overload.   He was started on milrinone, a Lasix drip at 20 mg per hour and metolazone 5 mg a day. His beta blocker was decreased. An abdominal ultrasound was performed to assess him for possible paracentesis. The abdominal ultrasound results are below, however he did not have fluid collections sufficient to allow for paracentesis.  On the milrinone and Lasix drip as well as the other meds, his weight began to decrease and his urine output increased.  He lost 25 pounds during his hospital stay. As his weight decreased, his respiratory status improved. The milrinone and lasix drips were d/c'd 06/17. He was changed to PO torsemide on 06/17 and the metolazone was d/c'd. His dose was adjusted at discharge because his weight was trending up. He will be watched closely. We may need to consider home milrinone in the future.  2. Chronic kidney disease stage IV: On admission, his BUN and creatinine were 68/2.85. With diuresis, there was an initial decrease in his creatinine although his BUN was stable/slightly higher. As diuresis continued, his BUN and creatinine increased again to a peak BUN of 101 and a peak creatinine of 2.88. His meds were adjusted and there was some improvement, discharge labs are below. A renal ultrasound was ordered to assess the patency of the previous graft to his right kidney, as well as his left kidney. 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  On the abdominal US, there was parenchymal thinning and bilateral renal cysts, some complex. He will need a follow-up ultrasound in 3-6 months as his creatinine is too high to do  any contrast studies.  3. Aortic stenosis: He had a 3/6 systolic murmur on exam. His echocardiogram was reviewed but it was felt that his gradient was probably underestimated due to his low EF. Once his case was fully reviewed, Dr. Gala Romney discussed the findings with Clinton [name]. Either AVR or TAVR would be very high risk because of renal  failure and PAD. Dr. Gala Romney felt there would be at least a 50% risk of renal failure requiring dialysis with any invasive procedures.   A TAVR consult was called to give Clinton [name] more information. He was seen by Dr. Excell Seltzer who felt that Clinton [name] is be on the point where we cannot help him with interventional therapies. There is a high likelihood of complication and progressive renal dysfunction intentionally requiring dialysis with any contrast procedures. The patient and his son were present during the discussion and agreed on a plan for palliative medical treatment.  4. Lower extremity edema: He continued to have problems with lower extremity edema and to help this, compression stocking were tried. This will be followed as an outpatient. The edema improved greatly and he will follow his weight as an outpatient.  5. Palliative care encounter: He was seen by the palliative care team and goals of care were established. For now, he is to be a limited code with no intubation. He does not wish artificial feeding at any time. He is agreeable to other procedures and treatments.  6. Generalized weakness: He was felt to have deconditioning from the prolonged hospital stay as well as generalized weakness prior to admission. The physical therapy consult was called. He was seen by PT during his hospital stay and he is to have home health PT plus a rolling walker with 5 inch wheels.  7. Anemia: He was noted to be anemic and his blood counts were followed during his hospital stay. There was concern for poor iron stores, so he was given Feraheme. Discharge labs are below.  8. PAD: he complained of leg pain and has had Ao-bifem in the past. ABIs were performed, results below. The waveforms are biphasic, but values are normal. No further workup is indicated.   9. Chronic anticoagulation: Clinton [name] was on coumadin PTA for history of CVA. His INR was followed during his stay and remained therapeutic. He will  be discharged on his home dose and will get a coumadin check this Friday. He has not history of atrial fibrillation.   There was concern that Clinton [name] would be better off with going to an Assisted Living facility. However, the cost was prohibitive and his son decided to take Clinton [name] home with home health.  On 06/21, he was seen by Dr Gala Romney and all data were reviewed. Medication adjustments were made to optimize his renal function and minimize med effects on his renal function. No further inpatient workup is indicated at this time and he is considered stable for discharge, to be followed closely as an outpatient.   Labs:   Lab Results  Component Value Date   WBC 10.6* 12/16/2014   HGB 11.4* 12/16/2014   HCT 36.7* 12/16/2014   MCV 79.1 12/16/2014   PLT 193 12/16/2014     Recent Labs Lab 12/16/14 0523  NA 133*  K 4.4  CL 88*  CO2 30  BUN 98*  CREATININE 2.66*  CALCIUM 9.0  GLUCOSE 104*   B NATRIURETIC PEPTIDE  Date/Time Value Ref Range Status  12/03/2014 07:09 PM >4500.0* 0.0 - 100.0 pg/mL Final  Recent Labs  12/16/14 0523  INR 2.62*   Lab Results  Component Value Date   IRON 48 12/08/2014   TIBC 547* 12/08/2014   FERRITIN 40 12/08/2014      Radiology: Dg Chest 2 View 12/02/2014   CLINICAL DATA:  Pneumonia.  Right pleural effusion.  EXAM: CHEST  2 VIEW  COMPARISON:  CT 07/22/2014 .  FINDINGS: Mediastinum hilar structures normal. Cardiomegaly with pulmonary vascular congestion. Diffuse interstitial prominence and bilateral pleural effusions noted. These findings are consistent congestive heart failure. No pneumothorax.  IMPRESSION: 1. Prior CABG. 2. Congestive heart failure with pulmonary interstitial edema and bilateral pleural effusions.   Electronically Signed   By: Maisie Fus  Register   On: 12/02/2014 09:32   US RenaL 12/08/2014   CLINICAL DATA:  74 year old male with renal failure. Initial encounter.  EXAM: RENAL / URINARY TRACT ULTRASOUND COMPLETE   COMPARISON:  05/10/2014 CT.  FINDINGS: Right Kidney:  Length: 10.6 cm. Renal parenchymal thinning without hydronephrosis. There are at least 3 renal cysts 2 of which appear simple (measuring up to 3.1 cm) however, 1 of the cysts in the upper pole region appears complex measuring 1.8 x 2.1 x 2 cm.  Left Kidney:  Length: 9.7 cm. Renal parenchymal thinning without hydronephrosis. 1.6 and a 2.4 cm cysts mid to lower pole region.  Bladder:  Partially contracted with a thick wall.  IMPRESSION: Bilateral renal parenchymal thinning without hydronephrosis.  Bilateral renal cysts. Right upper pole 2.1 cm cyst is complex. As patient has renal failure, this cannot be evaluated with contrast-enhanced Clinton. Recommend follow-up renal sonogram in 3-6 months for further delineation (sooner if patient develops hematuria).  Thickened urinary bladder wall. This may be partially explained by partial contraction and bladder outlet changes however, limited for excluding mucosal lesion.   Electronically Signed   By: Lacy Duverney M.D.   On: 12/08/2014 15:01   US Abdomen Limited 12/06/2014   CLINICAL DATA:  74 year old male presents for therapeutic paracentesis for ascites.  EXAM: LIMITED ABDOMEN ULTRASOUND FOR ASCITES  TECHNIQUE: Limited ultrasound survey for ascites was performed in all four abdominal quadrants.  COMPARISON:  CT scan of the abdomen and pelvis 05/10/2014  FINDINGS: Sonographic interrogation of the 4 quadrants of the abdomen demonstrates trace ascites in the perihepatic space and right lower quadrant. The ascites is inadequate for percutaneous drainage. Therefore, paracentesis was not performed.  IMPRESSION: Trace ascites which is of too small volume for paracentesis.  Signed,  Sterling Big, MD  [facility]  [facility]   Electronically Signed   By: Malachy Moan M.D.   On: 12/06/2014 10:13   Renal artery ultrasound: 12/10/2014 Summary: The right renal artery  graft is patent with a less than 60% Stenosis. The left renal artery had no Doppler flow proximally. 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  Other specific details can be found in the table(s) in the full report .  ABI 12/15/2014 Summary: ABIs are within normal limits with abnormal waveforms. Other specific details can be found in the table(s). Prepared and Electronically Authenticated by Dr Early  EKG: 12/08/2014 Sinus rhythm, left bundle branch block  Echo: 12/04/2014 Conclusions - Left ventricle: The cavity size was normal. There was moderateconcentric hypertrophy. Systolic function was moderately toseverely reduced. The estimated ejection fraction was in therange of 30% to 35%. Wall motion was normal; there were noregional wall motion abnormalities. Features are consistent witha pseudonormal left ventricular filling pattern, with concomitantabnormal relaxation and increased filling pressure (grade  2diastolic dysfunction). Doppler parameters are consistent withelevated ventricular end-diastolic filling pressure. - Ventricular septum: Septal motion showed paradox. The contourshowed diastolic flattening and systolic flattening. - Aortic valve: Valve mobility was restricted. There was moderatestenosis. There was trivial regurgitation. Valve area (VTI): 0.99cm^2. Valve area (Vmax): 0.87 cm^2. Valve area (Vmean): 0.85cm^2. Mean gradient (S): 19 mm Hg. Peak gradient (S): 36 mm Hg. - Aortic root: The aortic root was normal in size. - Mitral valve: There was moderate regurgitation. Valve area bypressure half-time: 2.12 cm^2. Valve area by continuity equation(using LVOT flow): 1.32 cm^2. - Left atrium: The atrium was moderately dilated. - Right ventricle: The cavity size was mildly dilated. Wall thickness was normal. Pacer wire or catheter noted in right ventricle. Systolic function was mildly reduced. - Right atrium: The atrium was moderately dilated. Pacer wire  orcatheter noted in right atrium. - Tricuspid valve: There was severe regurgitation. - Pulmonic valve: There was mild regurgitation. - Pulmonary arteries: Systolic pressure was severely increased. PA peak pressure: 67 mm Hg (S). Impressions: - LVEF is 30-35%, there is global hypokinesis with paradoxicalseptal motion. Grade 2 diastolic dysfunction with severely elevated filling pressures. There is systolic and diastolic septal flattening consistent withRV pressure and volume overload. RV is mildly dilated with mild systolic dysfunction. There is severe pulmonary hypertension.  FOLLOW UP PLANS AND APPOINTMENTS Allergies  Allergen Reactions  . Codeine Phosphate Nausea And Vomiting     Medication List    STOP taking these medications        ezetimibe 10 MG tablet  Commonly known as:  ZETIA     furosemide 40 MG tablet  Commonly known as:  LASIX     potassium chloride 10 MEQ tablet  Commonly known as:  K-DUR      TAKE these medications        carvedilol 3.125 MG tablet  Commonly known as:  COREG  Take 1 tablet (3.125 mg total) by mouth 2 (two) times daily with a meal.     feeding supplement (ENSURE COMPLETE) Liqd  Take 237 mLs by mouth 2 (two) times daily between meals.     hydrALAZINE 25 MG tablet  Commonly known as:  APRESOLINE  Take 0.5 tablets (12.5 mg total) by mouth 3 (three) times daily.     INCRUSE ELLIPTA 62.5 MCG/INH Aepb  Generic drug:  Umeclidinium Bromide  Inhale 1 puff into the lungs daily.     isosorbide mononitrate 30 MG 24 hr tablet  Commonly known as:  IMDUR  Take 1 tablet (30 mg total) by mouth at bedtime.     levothyroxine 75 MCG tablet  Commonly known as:  SYNTHROID, LEVOTHROID  Take 1 tablet (75 mcg total) by mouth daily before breakfast.     rosuvastatin 40 MG tablet  Commonly known as:  CRESTOR  Take 1 tablet (40 mg total) by mouth daily.     torsemide 20 MG tablet  Commonly known as:  DEMADEX  Take 2 tablets (40 mg total)  by mouth 2 (two) times daily.     traMADol 50 MG tablet  Commonly known as:  ULTRAM  Take 1 tablet (50 mg total) by mouth every 12 (twelve) hours as needed.     warfarin 6 MG tablet  Commonly known as:  COUMADIN  6 mg on Monday.  3 mg on all other days.        Discharge Instructions    (HEART FAILURE PATIENTS) Call MD:  Anytime you have any of the following symptoms: 1) 3  pound weight gain in 24 hours or 5 pounds in 1 week 2) shortness of breath, with or without a dry hacking cough 3) swelling in the hands, feet or stomach 4) if you have to sleep on extra pillows at night in order to breathe.    Complete by:  As directed      Diet - low sodium heart healthy    Complete by:  As directed      Increase activity slowly    Complete by:  As directed           Follow-up Information    Follow up with Arvilla Meres, MD On 12/22/2014.   Specialty:  Cardiology   Why:  See Provider at 1:30 pm, please arrive 15 minutes early for paperwork.   Contact information:   [address] [place] [address] [phone]       BRING ALL MEDICATIONS WITH YOU TO FOLLOW UP APPOINTMENTS  Time spent with patient to include physician time: 65 min Signed: Theodore Demark, PA-C 12/16/2014, 2:39 PM Co-Sign MD   Patient seen and examined with Theodore Demark, PA-C. We discussed all aspects of the encounter. I agree with the assessment and plan as stated above.   Clinton [name] is stable for d/c he diuresed well with milrinone support for end-stage low gradient AS and severe cardiomyopathy. We discussed palliative care and SNF placement but unfortunately didn't qualify for SNF so we have him go with family support and HHRN/PT.   Petra Dumler,MD 4:08 PM

## 2014-12-16 NOTE — Progress Notes (Signed)
Talked to [employer], she found a place for the patient but the family did not like the facility. ( only one place accepted him). VM to patient's son, only other alternative at this time would be discharge home with [facility] services ( HHRN/ PT/ OT/ nurses aide/ SW) and a private sitter list for the family to negotiate a rate and how many hours to stay with the patient. Awaiting a call back. Abelino Derrick RN,BSN,MHA [phone]

## 2014-12-16 NOTE — Progress Notes (Signed)
ANTICOAGULATION CONSULT NOTE - Follow Up Consult  Pharmacy Consult for Coumadin Indication: h/o CVA  Allergies  Allergen Reactions  . Codeine Phosphate Nausea And Vomiting    Patient Measurements: Height: 5' 11.5" (181.6 cm) Weight: 150 lb (68.04 kg) (scale a) IBW/kg (Calculated) : 76.45 Heparin Dosing Weight:    Vital Signs: Temp: 97.4 F (36.3 C) (06/21 0522) Temp Source: Oral (06/21 0522) BP: 126/60 mmHg (06/21 1050) Pulse Rate: 65 (06/21 1050)  Labs:  Recent Labs  12/14/14 0521 12/15/14 0400 12/16/14 0523  HGB  --   --  11.4*  HCT  --   --  36.7*  PLT  --   --  193  LABPROT 26.8* 27.0* 27.6*  INR 2.51* 2.54* 2.62*  CREATININE 2.48* 2.51* 2.66*    Estimated Creatinine Clearance: 23.4 mL/min (by C-G formula based on Cr of 2.66).   Assessment: CC:  CHF exacerbation- increasing LE edema, orthopnea, fatigue & chronically SOB.  Renal fx slight bump - backed off on diuresis now stable plan d/c home tomorrow.  Anticoagulation: Warfarin PTA for hx CVA (no Hx AFib).  INR 2.6.  Had a few extra doses of 6 mg last week.  No bleeding or complications noted.  Now seems to be holding stable PTA dose 3 mg daily EXCEPT for 6 mg on Mondays only  Goal of Therapy:  INR 2-3 Monitor platelets by anticoagulation protocol: Yes   Plan:  Warfarin 3 mg daily except 6 mg on Monday (Home dose)  Daily PT/INR   Leota Sauers Pharm.D. CPP, BCPS Clinical Pharmacist [phone] 12/16/2014 1:49 PM

## 2014-12-16 NOTE — Progress Notes (Signed)
Received call back from patient's son [name]. [name] stated that the family have worked out a plan to take the patient home and they will continue to look for Assisted Living Facilities. HHC choices offered, [name] chose [facility] for [facility] services. Lupita Leash with [facility] made aware and CM will give [name] a private sitter list to also assist in his care; Alexis Goodell [phone]

## 2014-12-16 NOTE — Progress Notes (Addendum)
CSW notified by [facility] that patient's son Clinton [name] has decided to take patient home with home health services. He has declined ALF placement at [facility] as they would accept patient for $1200.00 per month. (Patient's income is over $1300 per month.)  Son has declined this facility and CSW was unable to locate any other ALF willing to work with patient/family over income issues. Patient is over the income limit for Medicaid for ALF.  RNCM will arrange home health services and notified Tammy at [facility] of above.  CSW will sign off.  Lorri Frederick. Jaci Lazier, [place] [phone]

## 2014-12-18 ENCOUNTER — Emergency Department (HOSPITAL_COMMUNITY): Payer: Medicare HMO

## 2014-12-18 ENCOUNTER — Encounter (HOSPITAL_COMMUNITY): Payer: Self-pay | Admitting: Emergency Medicine

## 2014-12-18 ENCOUNTER — Inpatient Hospital Stay (HOSPITAL_COMMUNITY)
Admission: EM | Admit: 2014-12-18 | Discharge: 2014-12-23 | DRG: 297 | Disposition: A | Discharge status: Skilled Nursing Facility | Payer: Medicare HMO | Attending: Cardiology | Admitting: Cardiology

## 2014-12-18 DIAGNOSIS — R778 Other specified abnormalities of plasma proteins: Secondary | ICD-10-CM | POA: Diagnosis present

## 2014-12-18 DIAGNOSIS — K5900 Constipation, unspecified: Secondary | ICD-10-CM | POA: Diagnosis not present

## 2014-12-18 DIAGNOSIS — R55 Syncope and collapse: Secondary | ICD-10-CM | POA: Diagnosis present

## 2014-12-18 DIAGNOSIS — Z79899 Other long term (current) drug therapy: Secondary | ICD-10-CM | POA: Diagnosis not present

## 2014-12-18 DIAGNOSIS — I5022 Chronic systolic (congestive) heart failure: Secondary | ICD-10-CM | POA: Diagnosis present

## 2014-12-18 DIAGNOSIS — Z951 Presence of aortocoronary bypass graft: Secondary | ICD-10-CM | POA: Diagnosis not present

## 2014-12-18 DIAGNOSIS — Z66 Do not resuscitate: Secondary | ICD-10-CM | POA: Diagnosis present

## 2014-12-18 DIAGNOSIS — Z87891 Personal history of nicotine dependence: Secondary | ICD-10-CM | POA: Diagnosis not present

## 2014-12-18 DIAGNOSIS — Z7901 Long term (current) use of anticoagulants: Secondary | ICD-10-CM | POA: Diagnosis not present

## 2014-12-18 DIAGNOSIS — I350 Nonrheumatic aortic (valve) stenosis: Secondary | ICD-10-CM

## 2014-12-18 DIAGNOSIS — I5043 Acute on chronic combined systolic (congestive) and diastolic (congestive) heart failure: Secondary | ICD-10-CM | POA: Diagnosis present

## 2014-12-18 DIAGNOSIS — N179 Acute kidney failure, unspecified: Secondary | ICD-10-CM | POA: Diagnosis present

## 2014-12-18 DIAGNOSIS — Z8673 Personal history of transient ischemic attack (TIA), and cerebral infarction without residual deficits: Secondary | ICD-10-CM | POA: Diagnosis not present

## 2014-12-18 DIAGNOSIS — J449 Chronic obstructive pulmonary disease, unspecified: Secondary | ICD-10-CM | POA: Diagnosis present

## 2014-12-18 DIAGNOSIS — Z955 Presence of coronary angioplasty implant and graft: Secondary | ICD-10-CM

## 2014-12-18 DIAGNOSIS — Z95820 Peripheral vascular angioplasty status with implants and grafts: Secondary | ICD-10-CM | POA: Diagnosis not present

## 2014-12-18 DIAGNOSIS — E785 Hyperlipidemia, unspecified: Secondary | ICD-10-CM | POA: Diagnosis present

## 2014-12-18 DIAGNOSIS — J9610 Chronic respiratory failure, unspecified whether with hypoxia or hypercapnia: Secondary | ICD-10-CM | POA: Diagnosis present

## 2014-12-18 DIAGNOSIS — I469 Cardiac arrest, cause unspecified: Secondary | ICD-10-CM | POA: Diagnosis present

## 2014-12-18 DIAGNOSIS — E039 Hypothyroidism, unspecified: Secondary | ICD-10-CM | POA: Diagnosis present

## 2014-12-18 DIAGNOSIS — I248 Other forms of acute ischemic heart disease: Secondary | ICD-10-CM | POA: Diagnosis present

## 2014-12-18 DIAGNOSIS — I2510 Atherosclerotic heart disease of native coronary artery without angina pectoris: Secondary | ICD-10-CM | POA: Diagnosis present

## 2014-12-18 DIAGNOSIS — I129 Hypertensive chronic kidney disease with stage 1 through stage 4 chronic kidney disease, or unspecified chronic kidney disease: Secondary | ICD-10-CM | POA: Diagnosis present

## 2014-12-18 DIAGNOSIS — I252 Old myocardial infarction: Secondary | ICD-10-CM | POA: Diagnosis not present

## 2014-12-18 DIAGNOSIS — M109 Gout, unspecified: Secondary | ICD-10-CM | POA: Diagnosis present

## 2014-12-18 DIAGNOSIS — I10 Essential (primary) hypertension: Secondary | ICD-10-CM | POA: Diagnosis present

## 2014-12-18 DIAGNOSIS — I255 Ischemic cardiomyopathy: Secondary | ICD-10-CM | POA: Diagnosis present

## 2014-12-18 DIAGNOSIS — N184 Chronic kidney disease, stage 4 (severe): Secondary | ICD-10-CM | POA: Diagnosis present

## 2014-12-18 DIAGNOSIS — Z515 Encounter for palliative care: Secondary | ICD-10-CM | POA: Diagnosis not present

## 2014-12-18 DIAGNOSIS — R7989 Other specified abnormal findings of blood chemistry: Secondary | ICD-10-CM | POA: Diagnosis present

## 2014-12-18 LAB — I-STAT CHEM 8, ED
BUN: 115 mg/dL — ABNORMAL HIGH (ref 6–20)
CALCIUM ION: 1.06 mmol/L — AB (ref 1.13–1.30)
Chloride: 90 mmol/L — ABNORMAL LOW (ref 101–111)
Creatinine, Ser: 2.9 mg/dL — ABNORMAL HIGH (ref 0.61–1.24)
Glucose, Bld: 131 mg/dL — ABNORMAL HIGH (ref 65–99)
HEMATOCRIT: 36 % — AB (ref 39.0–52.0)
HEMOGLOBIN: 12.2 g/dL — AB (ref 13.0–17.0)
Potassium: 3.7 mmol/L (ref 3.5–5.1)
SODIUM: 130 mmol/L — AB (ref 135–145)
TCO2: 27 mmol/L (ref 0–100)

## 2014-12-18 LAB — CBC WITH DIFFERENTIAL/PLATELET
Basophils Absolute: 0 10*3/uL (ref 0.0–0.1)
Basophils Relative: 0 % (ref 0–1)
Eosinophils Absolute: 0 10*3/uL (ref 0.0–0.7)
Eosinophils Relative: 0 % (ref 0–5)
HEMATOCRIT: 33.1 % — AB (ref 39.0–52.0)
Hemoglobin: 10.6 g/dL — ABNORMAL LOW (ref 13.0–17.0)
LYMPHS ABS: 1.1 10*3/uL (ref 0.7–4.0)
Lymphocytes Relative: 12 % (ref 12–46)
MCH: 24.8 pg — AB (ref 26.0–34.0)
MCHC: 32 g/dL (ref 30.0–36.0)
MCV: 77.5 fL — AB (ref 78.0–100.0)
MONO ABS: 0.7 10*3/uL (ref 0.1–1.0)
Monocytes Relative: 7 % (ref 3–12)
Neutro Abs: 7.6 10*3/uL (ref 1.7–7.7)
Neutrophils Relative %: 81 % — ABNORMAL HIGH (ref 43–77)
PLATELETS: 167 10*3/uL (ref 150–400)
RBC: 4.27 MIL/uL (ref 4.22–5.81)
RDW: 21.2 % — ABNORMAL HIGH (ref 11.5–15.5)
WBC: 9.4 10*3/uL (ref 4.0–10.5)

## 2014-12-18 LAB — I-STAT TROPONIN, ED
TROPONIN I, POC: 0.42 ng/mL — AB (ref 0.00–0.08)
Troponin i, poc: 0.51 ng/mL (ref 0.00–0.08)

## 2014-12-18 LAB — PROTIME-INR
INR: 2.38 — ABNORMAL HIGH (ref 0.00–1.49)
Prothrombin Time: 25.7 seconds — ABNORMAL HIGH (ref 11.6–15.2)

## 2014-12-18 LAB — URIC ACID: Uric Acid, Serum: 13.2 mg/dL — ABNORMAL HIGH (ref 4.4–7.6)

## 2014-12-18 LAB — TROPONIN I: TROPONIN I: 0.98 ng/mL — AB (ref <0.031)

## 2014-12-18 MED ORDER — ENSURE COMPLETE PO LIQD
237.0000 mL | Freq: Every day | ORAL | Status: DC
  Administered 2014-12-18 – 2014-12-23 (×6): 237 mL via ORAL
  Filled 2014-12-18 (×7): qty 237

## 2014-12-18 MED ORDER — SODIUM CHLORIDE 0.9 % IJ SOLN: 3.0000 mL | INTRAMUSCULAR | Status: DC | PRN

## 2014-12-18 MED ORDER — ACETAMINOPHEN 325 MG PO TABS
650.0000 mg | ORAL_TABLET | ORAL | Status: DC | PRN
  Administered 2014-12-18 – 2014-12-19 (×2): 650 mg via ORAL
  Filled 2014-12-18 (×2): qty 2

## 2014-12-18 MED ORDER — ACETAMINOPHEN 325 MG PO TABS
650.0000 mg | ORAL_TABLET | Freq: Once | ORAL | Status: AC
  Administered 2014-12-18: 650 mg via ORAL
  Filled 2014-12-18: qty 2

## 2014-12-18 MED ORDER — UMECLIDINIUM BROMIDE 62.5 MCG/INH IN AEPB
1.0000 | INHALATION_SPRAY | Freq: Every day | RESPIRATORY_TRACT | Status: DC
  Administered 2014-12-19 – 2014-12-23 (×4): 1 via RESPIRATORY_TRACT

## 2014-12-18 MED ORDER — CARVEDILOL 3.125 MG PO TABS
3.1250 mg | ORAL_TABLET | Freq: Two times a day (BID) | ORAL | Status: DC
  Administered 2014-12-19: 3.125 mg via ORAL
  Filled 2014-12-18: qty 1

## 2014-12-18 MED ORDER — ONDANSETRON HCL 4 MG/2ML IJ SOLN
4.0000 mg | Freq: Four times a day (QID) | INTRAMUSCULAR | Status: DC | PRN
  Administered 2014-12-22: 4 mg via INTRAVENOUS
  Filled 2014-12-18: qty 2

## 2014-12-18 MED ORDER — FENTANYL CITRATE (PF) 100 MCG/2ML IJ SOLN
50.0000 ug | INTRAMUSCULAR | Status: DC | PRN
  Administered 2014-12-18 (×2): 50 ug via INTRAVENOUS
  Filled 2014-12-18 (×2): qty 2

## 2014-12-18 MED ORDER — LEVOTHYROXINE SODIUM 75 MCG PO TABS
75.0000 ug | ORAL_TABLET | Freq: Every day | ORAL | Status: DC
  Administered 2014-12-19 – 2014-12-23 (×5): 75 ug via ORAL
  Filled 2014-12-18 (×5): qty 1

## 2014-12-18 MED ORDER — FENTANYL CITRATE (PF) 100 MCG/2ML IJ SOLN
25.0000 ug | INTRAMUSCULAR | Status: DC | PRN
  Administered 2014-12-18 – 2014-12-19 (×2): 25 ug via INTRAVENOUS
  Filled 2014-12-18 (×3): qty 2

## 2014-12-18 MED ORDER — SODIUM CHLORIDE 0.9 % IJ SOLN
3.0000 mL | Freq: Two times a day (BID) | INTRAMUSCULAR | Status: DC
  Administered 2014-12-18 – 2014-12-19 (×2): 3 mL via INTRAVENOUS
  Administered 2014-12-19 – 2014-12-20 (×2): via INTRAVENOUS
  Administered 2014-12-20 – 2014-12-23 (×6): 3 mL via INTRAVENOUS

## 2014-12-18 MED ORDER — PREDNISONE 20 MG PO TABS
40.0000 mg | ORAL_TABLET | Freq: Every day | ORAL | Status: DC
  Administered 2014-12-19 – 2014-12-23 (×5): 40 mg via ORAL
  Filled 2014-12-18 (×5): qty 2

## 2014-12-18 MED ORDER — NITROGLYCERIN 0.4 MG SL SUBL: 0.4000 mg | SUBLINGUAL_TABLET | SUBLINGUAL | Status: DC | PRN

## 2014-12-18 MED ORDER — ROSUVASTATIN CALCIUM 40 MG PO TABS
40.0000 mg | ORAL_TABLET | Freq: Every day | ORAL | Status: DC
  Administered 2014-12-18: 40 mg via ORAL
  Filled 2014-12-18: qty 4
  Filled 2014-12-18 (×2): qty 1

## 2014-12-18 MED ORDER — ACETAMINOPHEN 500 MG PO TABS: 1000.0000 mg | ORAL_TABLET | Freq: Three times a day (TID) | ORAL | Status: DC | PRN

## 2014-12-18 MED ORDER — SODIUM CHLORIDE 0.9 % IV BOLUS (SEPSIS)
500.0000 mL | Freq: Once | INTRAVENOUS | Status: AC
  Administered 2014-12-18: 500 mL via INTRAVENOUS

## 2014-12-18 MED ORDER — ZOLPIDEM TARTRATE 5 MG PO TABS
5.0000 mg | ORAL_TABLET | Freq: Every evening | ORAL | Status: DC | PRN
  Administered 2014-12-19: 5 mg via ORAL
  Filled 2014-12-18: qty 1

## 2014-12-18 MED ORDER — ALPRAZOLAM 0.25 MG PO TABS
0.2500 mg | ORAL_TABLET | Freq: Two times a day (BID) | ORAL | Status: DC | PRN
  Administered 2014-12-20 – 2014-12-23 (×5): 0.25 mg via ORAL
  Filled 2014-12-18 (×5): qty 1

## 2014-12-18 MED ORDER — SODIUM CHLORIDE 0.9 % IV SOLN: 250.0000 mL | INTRAVENOUS | Status: DC | PRN

## 2014-12-18 MED ORDER — ONDANSETRON HCL 4 MG/2ML IJ SOLN
4.0000 mg | Freq: Once | INTRAMUSCULAR | Status: AC
  Administered 2014-12-18: 4 mg via INTRAVENOUS
  Filled 2014-12-18: qty 2

## 2014-12-18 NOTE — ED Notes (Signed)
Pt O2sat decreased to 85%ra after pain medication was administered. Pt placed on O2 via nasal cannula at 2lpm.

## 2014-12-18 NOTE — H&P (Signed)
History and Physical   Patient ID: Clinton [name] MRN: [id number], DOB/AGE: 1941-04-03 74 y.o. Date of Encounter: 12/18/2014  Primary Physician: No PCP Per Patient Primary Cardiologist: Dr Crenshaw/Dr Bensimhon  Chief Complaint:  Post-arrest  HPI: Clinton [name] is a 74 y.o. male with a history of CABG, PAD s/p Ao-bifem bypass and Ao-R renal bypass, carotid stenosis s/p bilateral CEA, LICA and L CCA stenting, HTN, HL, ICM, systolic HF, aortic stenosis, hypothyroidism, prior CVA, and CKD. He is on coumadin for a history of CVA.  He was discharged 06/21 after a 13 day hospital stay for CHF, at one point on Lasix and milrinone drips. He also has low-gradient (due to low EF) severe AS, not a TAVR or AVR candidate. During this past admission, he was seen by Palliative Care and made a limited code. He does not wish intubation or to be on a machine, but is agreeable to CPR, shocks if needed, and medications.  According to the patient, just after discharge, he his weight was 152 pounds. He weighed again the next day or today and his weight was 149 pounds. Additionally, the day of discharge and for the next 12-18 hours, he was very nauseated and by mouth intake was poor. He ambulates very little. He is still having symptoms from the gout with pain in all the joints of his right leg and some pain in his left knee.  Today, he was feeling okay, not unusually short of breath and not lightheaded. He had just eaten a salad and through the container in the trash, when he lost consciousness. He had no prodrome or warning. He lives with his son, who found him on the floor and started CPR.   According to information from records and his sons in the room, he required CPR for a couple of minutes. EMS was summoned in by their arrival, he had a pulse and was conscious. He was placed on oxygen but required no medications. He was transported to the hospital and in the hospital, his ECG is unchanged, he is  conscious and alert and complains only of chest wall soreness from the CPR plus leg pain.   Shortness of breath is at baseline. He has not had problems with being lightheaded and has had no presyncope. He has had no palpitations.   Past Medical History  Diagnosis Date  . CAD (coronary artery disease)     a. CABG 1985;  b. 05/2002 Rotablator/PTCA to ostial LCX and RI;  c. 12/2009 MV: inf and lat infarct w/ minimal mid lateral and basilar ant ischemia, EF 29%->Med Rx.  . Cardiomyopathy, ischemic     a. 06/2010 Cardiac MRI: EF 30%, anterolat, post, inf prior subendocardial infarcts;  b. 08/2012 Echo: EF 30-35%, mild LVH mult wma's, Gr 2 DD;  c. 05/2013 Echo: EF 20-25%, diff HK, mod AS, mild MR, mod dil LA, mild to mod TR, PASP .  . Stroke     remote  . Hypertension   . Hyperlipidemia   . Tobacco abuse   . Anticoagulated on Coumadin   . Chronic systolic CHF (congestive heart failure)     a. 05/2013 EF 20-25%, diff HK.  . Moderate aortic stenosis     a. 05/2013 Echo: at least moderate AS (Valve area 1.07 cm2 - VTI; 1.08cm2 - Vmax).  . Hypothyroidism     a. 05/2013 TSH 50.15 - synthroid 50 mcg daily started.  . Peripheral arterial disease     a.  Ao-bifem bypass w/ 16x8 hemashield dacron graft, R aortorenal bypass w/ 6mm dacron graft;  b. 09/2011 ABI: R 0/87, L 0.92.  . Carotid artery occlusion     a. 12/2002 R CEA;  b. 05/2007 L Carotid stenting;  c. 09/2008 repeat L carotid stenting 2/2 ISR;  d. 09/2011 Carotid U/S: RICA 40-59%, LICA 60-79%, >50 dist LCCA stenosis.  . CKD (chronic kidney disease), stage III   . Myocardial infarction 12/2009    MV: inf and lat infarct w/ minimal mid lateral and basilar ant ischemia, EF 29%->Med Rx  . Myocardial infarction 1985    "that's why I had the bypass"  . COPD (chronic obstructive pulmonary disease)   . Pneumonia 05/2014  . Arthritis     "hands" (12/03/2014)  . 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 Carotid endarterectomy Right 12/2002       . Aorta - bilateral femoral artery bypass graft  2004    Dr. Madilyn Fireman  . Aortic/renal bypass Right 2004    Dr. Madilyn Fireman  . Carotid stent Left 05/2007; 09/2008       . Coronary angioplasty with stent placement  05/2002    Rotablator/PTCA to ostial LCX and RI  . Inguinal hernia repair Right   . Back surgery    . Lumbar disc surgery  X 3?  Marland Kitchen Coronary artery bypass graft  1985    "CABG X5"     I have reviewed the patient's current medications. Medication Sig  acetaminophen (TYLENOL) 500 MG tablet Take 1,000 mg by mouth every 8 (eight) hours as needed (pain).  carvedilol (COREG) 3.125 MG tablet Take 1 tablet (3.125 mg total) by mouth 2 (two) times daily with a meal.  feeding supplement, ENSURE COMPLETE, (ENSURE COMPLETE) LIQD Take 237 mLs by mouth 2 (two) times daily between meals. Patient taking differently: Take 237 mLs by mouth daily.   hydrALAZINE (APRESOLINE) 25 MG tablet Take 0.5 tablets (12.5 mg total) by mouth 3 (three) times daily.  isosorbide mononitrate (IMDUR) 30 MG 24 hr tablet Take 1 tablet (30 mg total) by mouth at bedtime.  levothyroxine (SYNTHROID, LEVOTHROID) 75 MCG tablet Take 1 tablet (75 mcg total) by mouth daily before breakfast.  rosuvastatin (CRESTOR) 40 MG tablet Take 1 tablet (40 mg total) by mouth daily. Patient taking differently: Take 40 mg by mouth at bedtime.   torsemide (DEMADEX) 20 MG tablet Take 2 tablets (40 mg total) by mouth 2 (two) times daily.  Umeclidinium Bromide (INCRUSE ELLIPTA) 62.5 MCG/INH AEPB Inhale 1 puff into the lungs daily.  warfarin (COUMADIN) 6 MG tablet 6 mg on Monday.  3 mg on all other days.  traMADol (ULTRAM) 50 MG tablet Take 1 tablet (50 mg total) by mouth every 12 (twelve) hours as needed. Patient not taking: Reported on 12/18/2014   Scheduled Meds:  Continuous Infusions:  PRN Meds:.fentaNYL (SUBLIMAZE) injection  Allergies:  Allergies    Allergen Reactions  . Codeine Phosphate Nausea And Vomiting    History   Social History  . Marital Status: Divorced    Spouse Name: N/A  . Number of Children: N/A  . Years of Education: N/A   Occupational History  . Not on file.   Social History Main Topics  . Smoking status: Former Smoker -- 1.00 packs/day for 50 years    Types: Cigarettes    Quit date: 11/26/2014  . Smokeless  tobacco: Never Used  . Alcohol Use: No  . Drug Use: No  . Sexual Activity: No   Other Topics Concern  . Not on file   Social History Narrative   Lives in [place] with his son.  They eat out often and otherwise eat a lot of prepared/processed/microwave-type meals.  He continues to smoke a few cigarettes/day.    Family History  Problem Relation Age of Onset  . CAD    . Stroke Mother   . Heart attack Neg Hx    Family Status  Relation Status Death Age  . Mother Deceased 78    cad  . Father Deceased 52    cad  . Brother Deceased 55    cad  . Brother Deceased 66    cad    Review of Systems:   Full 14-point review of systems otherwise negative except as noted above.  Physical Exam: Blood pressure 123/89, pulse 62, temperature 97.8 F (36.6 C), temperature source Oral, resp. rate 21, height  (1.803 m), weight 142 lb (64.411 kg), SpO2 98 %. General: Well developed, well nourished,male in no acute distress. Head: Normocephalic, atraumatic, sclera non-icteric, no xanthomas, nares are without discharge. Dentition: Poor Neck: No carotid bruits. JVD to jaw. No thyromegally Lungs: Good expansion bilaterally. without wheezes or rhonchi. Rales bases Heart: Regular rate and rhythm with S1 S2.  No S3 or S4.  3/6 murmur, no rubs, or gallops appreciated. Abdomen: Soft, non-tender, non-distended with normoactive bowel sounds. No hepatomegaly. No rebound/guarding. No obvious abdominal masses. Msk:  Strength and tone appear generally weak for age. No joint deformities or effusions, no spine or  costo-vertebral angle tenderness. Extremities: No clubbing or cyanosis. No edema.  Distal pedal pulses are 1-2+ in 4 extrem Neuro: Alert and oriented X 3. Moves all extremities spontaneously. No focal deficits noted. Psych:  Responds to questions appropriately with a normal affect. Skin: No rashes or lesions noted. He has several areas of ecchymosis on his arms from his recent hospitalization.  Labs:   Lab Results  Component Value Date   WBC 9.4 12/18/2014   HGB 12.2* 12/18/2014   HCT 36.0* 12/18/2014   MCV 77.5* 12/18/2014   PLT 167 12/18/2014    Recent Labs  12/18/14 1319  INR 2.38*     Recent Labs Lab 12/16/14 0523 12/18/14 1344  NA 133* 130*  K 4.4 3.7  CL 88* 90*  CO2 30  --   BUN 98* 115*  CREATININE 2.66* 2.90*  CALCIUM 9.0  --   GLUCOSE 104* 131*    Recent Labs  12/18/14 1342  TROPIPOC 0.51*    B NATRIURETIC PEPTIDE  Date/Time Value Ref Range Status  12/03/2014 07:09 PM >4500.0* 0.0 - 100.0 pg/mL Final    Radiology/Studies: Dg Chest 2 View 12/18/2014   CLINICAL DATA:  Loss of consciousness. Fall at home today. Left neck pain. Chest pain is worse with expiration.  EXAM: CHEST - 2 VIEW  COMPARISON:  Two-view chest x-ray 12/02/2014.  FINDINGS: The heart is enlarged. A diffuse interstitial pattern is slightly more prominent than on the prior study. Airspace disease the right base is improved. A chronic right pleural effusion is again noted. Median sternotomy for CABG is evident.  IMPRESSION: 1. Cardiomegaly with slight increase in diffuse edema compatible with congestive heart failure. 2. Improved aeration at right lung base. 3. Similar appearance chronic right pleural effusion.   Electronically Signed   By: Marin Roberts M.D.   On: 12/18/2014 14:21  Ct Head and C spine Wo Contrast 12/18/2014   CLINICAL DATA:  Syncopal episode today.  Fell and hit head.  EXAM: CT HEAD WITHOUT CONTRAST  CT CERVICAL SPINE WITHOUT CONTRAST  TECHNIQUE: Multidetector CT  imaging of the head and cervical spine was performed following the standard protocol without intravenous contrast. Multiplanar CT image reconstructions of the cervical spine were also generated.  COMPARISON:  None.  FINDINGS: CT HEAD FINDINGS  The ventricles are in the midline without mass effect or shift. They are within normal limits in size and configuration given the degree of cerebral atrophy. There are remote appearing infarcts noted in the right posterior parietal region, right occipital region and right cerebellum. No findings for acute hemispheric infarction an or intracranial hemorrhage. No extra-axial fluid collections.  No acute skull fracture is identified. The paranasal sinuses and mastoid air cells are clear. The globes are intact.  CT CERVICAL SPINE FINDINGS  Degenerative cervical spondylosis with multilevel disc disease and facet disease. No acute cervical spine fracture. The facets are normally aligned. The skullbase C1 and C1-2 articulations are maintained. The dens is intact. No abnormal prevertebral soft tissue swelling. Mild bony foraminal stenosis at C3-4 and left due to uncinate spurring and facet disease. Similar findings at C5-6.  Emphysematous changes are noted at the lung apices along with scarring changes. A left carotid artery stent is noted.  IMPRESSION: 1. Remote appearing right-sided posterior parietal, occipital and cerebellar infarcts. No definite acute hemispheric infarction or intracranial hemorrhage. No mass lesions or extra-axial fluid collections. 2. No acute skull fracture. 3. Degenerative cervical spondylosis with multilevel disc disease and facet disease but no acute cervical spine fracture.   Electronically Signed   By: Rudie Meyer M.D.   On: 12/18/2014 14:43   Echo: 12/14/2014 - Left ventricle: The cavity size was normal. There was moderate concentric hypertrophy. Systolic function was moderately to severely reduced. The estimated ejection fraction was in  the range of 30% to 35%. Wall motion was normal; there were no regional wall motion abnormalities. Features are consistent with a pseudonormal left ventricular filling pattern, with concomitant abnormal relaxation and increased filling pressure (grade 2 diastolic dysfunction). Doppler parameters are consistent with elevated ventricular end-diastolic filling pressure. - Ventricular septum: Septal motion showed paradox. The contour showed diastolic flattening and systolic flattening. - Aortic valve: Valve mobility was restricted. There was moderate stenosis. There was trivial regurgitation. Valve area (VTI): 0.99 cm^2. Valve area (Vmax): 0.87 cm^2. Valve area (Vmean): 0.85 cm^2. - Aortic root: The aortic root was normal in size. - Mitral valve: There was moderate regurgitation. Valve area by pressure half-time: 2.12 cm^2. Valve area by continuity equation (using LVOT flow): 1.32 cm^2. - Left atrium: The atrium was moderately dilated. - Right ventricle: The cavity size was mildly dilated. Wall thickness was normal. Pacer wire or catheter noted in right ventricle. Systolic function was mildly reduced. - Right atrium: The atrium was moderately dilated. Pacer wire or catheter noted in right atrium. - Tricuspid valve: There was severe regurgitation. - Pulmonic valve: There was mild regurgitation. - Pulmonary arteries: Systolic pressure was severely increased. PA peak pressure: 67 mm Hg (S). Impressions: - LVEF is 30-35%, there is global hypokinesis with paradoxical septal motion. Grade 2 diastolic dysfunction with severely elevated filling pressures. There is systolic and diastolic septal flattening consistent with RV pressure and volume overload. RV is mildly dilated with mild systolic dysfunction. There is severe pulmonary hypertension.  ECG: 12/18/2014 Sinus rhythm, small P waves with borderline first-degree AV  block and left bundle branch  block which is old  ASSESSMENT AND PLAN:  Principal Problem:   Cardiac arrest - Patient had CPR from his son without shocks or medications being used. - Per his wishes, he is not a candidate for intubation - Follow on telemetry, his precipitating event may have been a combination of hypotension secondary to possible decreased circulating blood volume, CHF causing hypoxia, and severe aortic stenosis - Will discontinue isosorbide, hold torsemide and Aldactone. Hold hydralazine as well - Continue Coreg as blood pressure will allow - Patient to be evaluated by the CHF team in a.m. with further treatment plans to be made at that time. - Discussed with the patient and 2 of his sons the fine line that he walks between keeping his volume status low enough so that he can breathe, versus keeping enough volume on board so that he tolerates his aortic stenosis.  Otherwise, continue home medications.  Active Problems:   Essential hypertension -  Good control right now    CKD (chronic kidney disease) stage 4, GFR 15-29 ml/min - BUN/creatinine higher than usual, hold diuretics recheck in a.m.    Acute on chronic systolic and diastolic heart failure, NYHA class 3 - Check BNP - Chest x-ray shows slightly increased volume, but will hold off on diuresis as he may be dry secondary to weight loss    Aortic stenosis, severe - May be contributing to the event    Elevated troponin - We will cycle his enzymes but feel his elevated troponin is secondary to demand ischemia/CPR.   - Doubt any further evaluation is indicated.     Chronic anticoagulation - Hold Coumadin tonight.  - Per previous notes, he is on Coumadin for prior CVA.  - It may be time to reassess the risk/benefits of this medication.    Gout  - uric acid level was 10.6 on 12/08/2014 - recheck and restart steroids  Signed, Theodore Demark, PA-C 12/18/2014 5:21 PM Beeper [phone]

## 2014-12-18 NOTE — Progress Notes (Signed)
CRITICAL VALUE ALERT  Critical value received:  Troponin 0.98   Date of notification:  12/18/2014  Time of notification:  2147  Critical value read back:Yes.    Nurse who received alert:  B. Quintella Baton RN  MD notified (1st page):  [facility] on call for Cardiology   Time of first page:  2155  MD notified (2nd page):  [facility] on call for Cardiology   Time of second page:  2247    Responding MD:  Hiram Comber  Time MD responded:  2250  Patient has not complained of chest pain this shift, did complain of some rib pain related to CPR that was administered earlier today.  RN updated Hiram Comber of this information.  No new orders received at this time.

## 2014-12-18 NOTE — ED Provider Notes (Signed)
CSN: [id number]     Arrival date & time 12/18/14  1156 History   First MD Initiated Contact with Patient 12/18/14 1209     Chief Complaint  Patient presents with  . Loss of Consciousness     (Consider location/radiation/quality/duration/timing/severity/associated sxs/prior Treatment) HPI   Clinton [name] is a 73 y.o. male who presents for evaluation of syncope, with prolonged unresponsiveness. Patient was sitting on a bed, when he reportedly stood up, fell and struck his head. His son heard him fall, went to him and found him to be unconscious, lying on his left side. His son called EMS, who directed him to roll the patient over and began chest compressions. The son states that he gave him chest compressions for 2 minutes, whereupon the patient pushed his hand away and began talking. The patient injured his face, when he fell. He complains of chest soreness since receiving the chest compressions. The patient did not have preceding symptoms. He was discharged from the hospital 2 days ago, after treatment for volume overload. He probably has aortic valve pathology. He's been doing okay since being discharged to home. There's been no recent fever, chills, cough, shortness of breath, chest pain, or change in bowel and urinary habits. He is taking his usual medications. There are no other known modifying factors.   Past Medical History  Diagnosis Date  . CAD (coronary artery disease)     a. CABG 1985;  b. 05/2002 Rotablator/PTCA to ostial LCX and RI;  c. 12/2009 MV: inf and lat infarct w/ minimal mid lateral and basilar ant ischemia, EF 29%->Med Rx.  . Cardiomyopathy, ischemic     a. 06/2010 Cardiac MRI: EF 30%, anterolat, post, inf prior subendocardial infarcts;  b. 08/2012 Echo: EF 30-35%, mild LVH mult wma's, Gr 2 DD;  c. 05/2013 Echo: EF 20-25%, diff HK, mod AS, mild MR, mod dil LA, mild to mod TR, PASP .  . Stroke     remote  . Hypertension   . Hyperlipidemia   . Tobacco abuse   .  Anticoagulated on Coumadin   . Chronic systolic CHF (congestive heart failure)     a. 05/2013 EF 20-25%, diff HK.  . Moderate aortic stenosis     a. 05/2013 Echo: at least moderate AS (Valve area 1.07 cm2 - VTI; 1.08cm2 - Vmax).  . Hypothyroidism     a. 05/2013 TSH 50.15 - synthroid 50 mcg daily started.  . Peripheral arterial disease     a. Ao-bifem bypass w/ 16x8 hemashield dacron graft, R aortorenal bypass w/ 6mm dacron graft;  b. 09/2011 ABI: R 0/87, L 0.92.  . Carotid artery occlusion     a. 12/2002 R CEA;  b. 05/2007 L Carotid stenting;  c. 09/2008 repeat L carotid stenting 2/2 ISR;  d. 09/2011 Carotid U/S: RICA 40-59%, LICA 60-79%, >50 dist LCCA stenosis.  . CKD (chronic kidney disease), stage III   . Myocardial infarction 12/2009    MV: inf and lat infarct w/ minimal mid lateral and basilar ant ischemia, EF 29%->Med Rx  . Myocardial infarction 1985    "that's why I had the bypass"  . COPD (chronic obstructive pulmonary disease)   . Pneumonia 05/2014  . Arthritis     "hands" (12/03/2014)  . 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 Carotid endarterectomy Right 12/2002       . Aorta - bilateral femoral artery  bypass graft  2004    Dr. Madilyn Fireman  . Aortic/renal bypass Right 2004    Dr. Madilyn Fireman  . Carotid stent Left 05/2007; 09/2008       . Coronary angioplasty with stent placement  05/2002    Rotablator/PTCA to ostial LCX and RI  . Inguinal hernia repair Right   . Back surgery    . Lumbar disc surgery  X 3?  Marland Kitchen Coronary artery bypass graft  1985    "CABG X5"   Family History  Problem Relation Age of Onset  . CAD    . Stroke Mother   . Heart attack Neg Hx    History  Substance Use Topics  . Smoking status: Former Smoker -- 1.00 packs/day for 50 years    Types: Cigarettes    Quit date: 11/26/2014  . Smokeless tobacco: Never Used  . Alcohol Use: No    Review of Systems  All other systems reviewed and are  negative.     Allergies  Codeine phosphate  Home Medications   Prior to Admission medications   Medication Sig Start Date End Date Taking? Authorizing Provider  carvedilol (COREG) 3.125 MG tablet Take 1 tablet (3.125 mg total) by mouth 2 (two) times daily with a meal. 12/16/14   Joline Salt Barrett, PA-C  feeding supplement, ENSURE COMPLETE, (ENSURE COMPLETE) LIQD Take 237 mLs by mouth 2 (two) times daily between meals. Patient taking differently: Take 237 mLs by mouth daily.  05/24/14   Leana Roe Elgergawy, MD  hydrALAZINE (APRESOLINE) 25 MG tablet Take 0.5 tablets (12.5 mg total) by mouth 3 (three) times daily. 12/16/14   Rhonda G Barrett, PA-C  isosorbide mononitrate (IMDUR) 30 MG 24 hr tablet Take 1 tablet (30 mg total) by mouth at bedtime. 12/16/14   Rhonda G Barrett, PA-C  levothyroxine (SYNTHROID, LEVOTHROID) 75 MCG tablet Take 1 tablet (75 mcg total) by mouth daily before breakfast. 07/11/14   Lewayne Bunting, MD  rosuvastatin (CRESTOR) 40 MG tablet Take 1 tablet (40 mg total) by mouth daily. Patient taking differently: Take 40 mg by mouth at bedtime.  06/19/13   Rosalio Macadamia, NP  torsemide (DEMADEX) 20 MG tablet Take 2 tablets (40 mg total) by mouth 2 (two) times daily. 12/16/14   Rhonda G Barrett, PA-C  traMADol (ULTRAM) 50 MG tablet Take 1 tablet (50 mg total) by mouth every 12 (twelve) hours as needed. 12/16/14   Rhonda G Barrett, PA-C  Umeclidinium Bromide (INCRUSE ELLIPTA) 62.5 MCG/INH AEPB Inhale 1 puff into the lungs daily.    Historical Provider, MD  warfarin (COUMADIN) 6 MG tablet 6 mg on Monday.  3 mg on all other days. 12/16/14   Rhonda G Barrett, PA-C   BP 109/59 mmHg  Pulse 64  Temp(Src) 97.8 F (36.6 C) (Oral)  Resp 13  Ht  (1.803 m)  Wt 142 lb (64.411 kg)  BMI 19.81 kg/m2  SpO2 96% Physical Exam  Constitutional: He is oriented to person, place, and time. He appears well-developed.  Elderly, frail  HENT:  Head: Normocephalic.  Right Ear: External ear  normal.  Left Ear: External ear normal.  Abrasion right forehead, nonbleeding.  Eyes: Conjunctivae and EOM are normal. Pupils are equal, round, and reactive to light.  Neck: Normal range of motion and phonation normal. Neck supple.  Cardiovascular: Normal rate, regular rhythm and normal heart sounds.   Pulmonary/Chest: Effort normal and breath sounds normal. No respiratory distress. He has no wheezes. He has no rales. He  exhibits no tenderness and no bony tenderness.  rotation of the chest wall.  Abdominal: Soft. There is no tenderness.  Musculoskeletal: Normal range of motion.  No step-off of cervical spine. Normal range of motion, arms and legs bilaterally.  Neurological: He is alert and oriented to person, place, and time. No cranial nerve deficit or sensory deficit. He exhibits normal muscle tone. Coordination normal.  Skin: Skin is warm, dry and intact.  Small abrasion left anterior shin. Nonbleeding.  Psychiatric: He has a normal mood and affect. His behavior is normal. Judgment and thought content normal.  Nursing note and vitals reviewed.   ED Course  Procedures (including critical care time)  Medications  sodium chloride 0.9 % bolus 500 mL (500 mLs Intravenous New Bag/Given 12/18/14 1332)  acetaminophen (TYLENOL) tablet 650 mg (650 mg Oral Given 12/18/14 1337)    Patient Vitals for the past 24 hrs:  BP Temp Temp src Pulse Resp SpO2 Height Weight  12/18/14 1245 109/59 mmHg - - 64 13 96 % - -  12/18/14 1211 110/59 mmHg 97.8 F (36.6 C) Oral 68 20 99 % 5\' 11"  (1.803 m) 142 lb (64.411 kg)  12/18/14 1200 (!) 110/54 mmHg - - 67 13 93 % - -    2:33 PM Reevaluation with update and discussion. After initial assessment and treatment, an updated evaluation reveals he remains alert and cooperative. No ectopy on monitor. Noted elevated troponin, repeat ordered. He continues to have some mild chest soreness.Mancel Bale L   15:20- ED evaluation, complete, with the exception of second  troponin. Troponin today is elevated, but at recent baseline. EKG and clinical examination is not consistent with ACS. Cardiology paged for input, and possibly admission for evaluation, since he may have had a cardiac arrhythmia and/or sudden death with our ROSC.  Labs Review Labs Reviewed  CBC WITH DIFFERENTIAL/PLATELET - Abnormal; Notable for the following:    Hemoglobin 10.6 (*)    HCT 33.1 (*)    MCV 77.5 (*)    MCH 24.8 (*)    RDW 21.2 (*)    Neutrophils Relative % 81 (*)    All other components within normal limits  PROTIME-INR - Abnormal; Notable for the following:    Prothrombin Time 25.7 (*)    INR 2.38 (*)    All other components within normal limits  I-STAT CHEM 8, ED - Abnormal; Notable for the following:    Sodium 130 (*)    Chloride 90 (*)    BUN 115 (*)    Creatinine, Ser 2.90 (*)    Glucose, Bld 131 (*)    Calcium, Ion 1.06 (*)    Hemoglobin 12.2 (*)    HCT 36.0 (*)    All other components within normal limits  I-STAT TROPOININ, ED - Abnormal; Notable for the following:    Troponin i, poc 0.51 (*)    All other components within normal limits    Imaging Review Dg Chest 2 View  12/18/2014   CLINICAL DATA:  Loss of consciousness. Fall at home today. Left neck pain. Chest pain is worse with expiration.  EXAM: CHEST - 2 VIEW  COMPARISON:  Two-view chest x-ray 12/02/2014.  FINDINGS: The heart is enlarged. A diffuse interstitial pattern is slightly more prominent than on the prior study. Airspace disease the right base is improved. A chronic right pleural effusion is again noted. Median sternotomy for CABG is evident.  IMPRESSION: 1. Cardiomegaly with slight increase in diffuse edema compatible with congestive heart failure. 2.  Improved aeration at right lung base. 3. Similar appearance chronic right pleural effusion.   Electronically Signed   By: Marin Roberts M.D.   On: 12/18/2014 14:21     EKG Interpretation   Date/Time:  Thursday December 18 2014 12:01:18  EDT Ventricular Rate:  66 PR Interval:  108 QRS Duration: 218 QT Interval:  536 QTC Calculation: 562 R Axis:   -53 Text Interpretation:  Sinus or ectopic atrial rhythm Short PR interval  Left bundle branch block Since last tracing PR interval has decreased  Confirmed by Effie Shy  MD, Chimere Klingensmith ([id number]) on 12/18/2014 1:08:50 PM      MDM   Final diagnoses:  Syncope, unspecified syncope type    Syncope following recent diuresis, and likely volume depletion. He has ongoing chest soreness that I believe is secondary to receiving  chest compressions. Initial troponin is mildly elevated, at baseline. Repeat troponin is pending. No evidence for SBI, metabolic instability or suggestion for impending vascular collapse.  Nursing Notes Reviewed/ Care Coordinated, and agree without changes. Applicable Imaging Reviewed.  Interpretation of Laboratory Data incorporated into ED treatment  Plan: Admit    Mancel Bale, MD 12/22/14 1006

## 2014-12-18 NOTE — ED Notes (Signed)
Pt arrived from home with son by [facility] with c/o syncopal episode. Pt son stated to EMS that pt was walking to throw trash away and collapsed to floor unresponsive. Pt son started CPR on pt, first EMS arrived and stated that pt had a pulse with agonal respirations. Second EMS arrived and pt has been alert and talking ever since their arrival. Pt has abrasion to right side of forehead along with some swelling and brusing, skin tear to bilateral upper arms bleeding controlled. Pt is on Coumadin and took medication this morning. Pt c/o some chest discomfort possibly d/t chest compressions. Pt recently d/c from hospital after 2 week stay. Yesterday pt had 30lbs of fluid taken off from legs.  Pt does not recall syncopal event.

## 2014-12-18 NOTE — ED Notes (Signed)
CRITICAL VALUE ALERT  Critical value received:  Troponin 0.51 Date of notification: 12/18/14  Time of notification:  1356  Critical value read back:Yes.    Nurse who received alert: Maximiano Coss RN  MD notified (1st page): Dr. Effie Shy

## 2014-12-19 ENCOUNTER — Other Ambulatory Visit: Payer: Self-pay

## 2014-12-19 DIAGNOSIS — Z515 Encounter for palliative care: Secondary | ICD-10-CM

## 2014-12-19 LAB — COMPREHENSIVE METABOLIC PANEL
ALK PHOS: 165 U/L — AB (ref 38–126)
ALT: 19 U/L (ref 17–63)
AST: 21 U/L (ref 15–41)
Albumin: 3.1 g/dL — ABNORMAL LOW (ref 3.5–5.0)
Anion gap: 13 (ref 5–15)
BILIRUBIN TOTAL: 1.1 mg/dL (ref 0.3–1.2)
BUN: 110 mg/dL — ABNORMAL HIGH (ref 6–20)
CHLORIDE: 90 mmol/L — AB (ref 101–111)
CO2: 28 mmol/L (ref 22–32)
Calcium: 8.6 mg/dL — ABNORMAL LOW (ref 8.9–10.3)
Creatinine, Ser: 3.02 mg/dL — ABNORMAL HIGH (ref 0.61–1.24)
GFR calc Af Amer: 22 mL/min — ABNORMAL LOW (ref >60)
GFR, EST NON AFRICAN AMERICAN: 19 mL/min — AB (ref >60)
Glucose, Bld: 101 mg/dL — ABNORMAL HIGH (ref 65–99)
POTASSIUM: 3.9 mmol/L (ref 3.5–5.1)
Sodium: 131 mmol/L — ABNORMAL LOW (ref 135–145)
Total Protein: 6.8 g/dL (ref 6.5–8.1)

## 2014-12-19 LAB — TROPONIN I
Troponin I: 0.89 ng/mL (ref <0.031)
Troponin I: 0.83 ng/mL (ref <0.031)

## 2014-12-19 LAB — PROTIME-INR
INR: 2.79 — ABNORMAL HIGH (ref 0.00–1.49)
PROTHROMBIN TIME: 29 s — AB (ref 11.6–15.2)

## 2014-12-19 MED ORDER — DOCUSATE SODIUM 100 MG PO CAPS
100.0000 mg | ORAL_CAPSULE | Freq: Two times a day (BID) | ORAL | Status: DC | PRN
  Administered 2014-12-20 – 2014-12-22 (×4): 100 mg via ORAL
  Filled 2014-12-19 (×4): qty 1

## 2014-12-19 MED ORDER — CETYLPYRIDINIUM CHLORIDE 0.05 % MT LIQD
7.0000 mL | Freq: Two times a day (BID) | OROMUCOSAL | Status: DC
  Administered 2014-12-19 – 2014-12-23 (×9): 7 mL via OROMUCOSAL

## 2014-12-19 MED ORDER — OXYCODONE-ACETAMINOPHEN 5-325 MG PO TABS
1.0000 | ORAL_TABLET | ORAL | Status: DC | PRN
  Administered 2014-12-19 – 2014-12-20 (×4): 2 via ORAL
  Administered 2014-12-21 (×4): 1 via ORAL
  Administered 2014-12-22 (×3): 2 via ORAL
  Administered 2014-12-23 (×5): 1 via ORAL
  Filled 2014-12-19: qty 2
  Filled 2014-12-19: qty 1
  Filled 2014-12-19: qty 2
  Filled 2014-12-19 (×2): qty 1
  Filled 2014-12-19: qty 2
  Filled 2014-12-19 (×2): qty 1
  Filled 2014-12-19 (×2): qty 2
  Filled 2014-12-19 (×4): qty 1
  Filled 2014-12-19: qty 2
  Filled 2014-12-19: qty 1
  Filled 2014-12-19: qty 2

## 2014-12-19 MED ORDER — MORPHINE SULFATE 2 MG/ML IJ SOLN
2.0000 mg | Freq: Once | INTRAMUSCULAR | Status: AC
  Administered 2014-12-19: 2 mg via INTRAVENOUS
  Filled 2014-12-19: qty 1

## 2014-12-19 NOTE — Progress Notes (Signed)
Advanced Heart Failure Rounding Note   Subjective:    Clinton [name] is a 74 y.o. male with a history of CABG, PAD s/p Ao-bifem bypass and Ao-R renal bypass, carotid stenosis s/p bilateral CEA, LICA and L CCA stenting, systolic HF EF 30%, severe aortic stenosis, and CKD (baseline Cr ~2.5). He is on coumadin for a history of CVA. (No mention of PAF in chart)  He was discharged 06/21 after a 13 day hospital stay for cardiogenic shock in etting of severe low gradient AS. Treted with inotropes and IV lasix. Felt not to be candidate for TAVR due to renal failure and severe COPD.  Was made DNR. Admitted yesterday with syncope/cardiac arrest with CPR by his son.   Feels ok this am. Chest hurts from CPR. He feels very sad. Denies dyspnea.     Objective:   Weight Range:  Vital Signs:   Temp:  [97.8 F (36.6 C)-98.2 F (36.8 C)] 97.9 F (36.6 C) (06/24 0538) Pulse Rate:  [62-66] 66 (06/24 0538) Resp:  [20-21] 20 (06/24 0538) BP: (105-123)/(56-89) 115/56 mmHg (06/24 0538) SpO2:  [93 %-99 %] 95 % (06/24 0538) Weight:  [71.215 kg (157 lb)] 71.215 kg (157 lb) (06/24 0538) Last BM Date: 12/18/14  Weight change: Filed Weights   12/18/14 1211 12/19/14 0538  Weight: 64.411 kg (142 lb) 71.215 kg (157 lb)    Intake/Output:   Intake/Output Summary (Last 24 hours) at 12/19/14 1338 Last data filed at 12/19/14 1202  Gross per 24 hour  Intake    542 ml  Output    580 ml  Net    -38 ml     Physical Exam: General: Elderly. Sitting in chair. NAD HEENT: ecchymosis on R eye and forehead Neck: supple. JVP to jaw cm. Carotids 2+ bilat; no bruits. No lymphadenopathy or thryomegaly appreciated. Cor: PMI nondisplaced. Regular rate & rhythm. 2/6 AS with moderately depressed S2, 2/6 MR Lungs: Bibasilar crackles/diminished  Abdomen: nontender. soft. Midline hernia present, easily reducible, non tender. No hepatosplenomegaly. No bruits or masses. Good bowel sounds. Extremities: no cyanosis,  clubbing, rash. 1+ woody edema Neuro: alert & orientedx3, cranial nerves grossly intact. moves all 4 extremities w/o difficulty. Affect sad Telemetry: SR with LBBB  Labs: Basic Metabolic Panel:  Recent Labs Lab 12/13/14 0529 12/14/14 0521 12/15/14 0400 12/16/14 0523 12/18/14 1344 12/19/14 0653  NA 132* 133* 132* 133* 130* 131*  K 3.9 3.8 3.6 4.4 3.7 3.9  CL 83* 87* 86* 88* 90* 90*  CO2 33* 33* 33* 30  --  28  GLUCOSE 101* 97 125* 104* 131* 101*  BUN 101* 94* 91* 98* 115* 110*  CREATININE 2.78* 2.48* 2.51* 2.66* 2.90* 3.02*  CALCIUM 8.6* 8.8* 8.5* 9.0  --  8.6*    Liver Function Tests:  Recent Labs Lab 12/19/14 0653  AST 21  ALT 19  ALKPHOS 165*  BILITOT 1.1  PROT 6.8  ALBUMIN 3.1*   No results for input(s): LIPASE, AMYLASE in the last 168 hours. No results for input(s): AMMONIA in the last 168 hours.  CBC:  Recent Labs Lab 12/16/14 0523 12/18/14 1319 12/18/14 1344  WBC 10.6* 9.4  --   NEUTROABS  --  7.6  --   HGB 11.4* 10.6* 12.2*  HCT 36.7* 33.1* 36.0*  MCV 79.1 77.5*  --   PLT 193 167  --     Cardiac Enzymes:  Recent Labs Lab 12/18/14 2032 12/19/14 0115 12/19/14 0653  TROPONINI 0.98* 0.89* 0.83*    BNP:  BNP (last 3 results)  Recent Labs  12/03/14 1909  BNP >4500.0*    ProBNP (last 3 results)  Recent Labs  05/10/14 0935  PROBNP 16109.6*      Other results:  Imaging: Dg Chest 2 View  12/18/2014   CLINICAL DATA:  Loss of consciousness. Fall at home today. Left neck pain. Chest pain is worse with expiration.  EXAM: CHEST - 2 VIEW  COMPARISON:  Two-view chest x-ray 12/02/2014.  FINDINGS: The heart is enlarged. A diffuse interstitial pattern is slightly more prominent than on the prior study. Airspace disease the right base is improved. A chronic right pleural effusion is again noted. Median sternotomy for CABG is evident.  IMPRESSION: 1. Cardiomegaly with slight increase in diffuse edema compatible with congestive heart failure. 2.  Improved aeration at right lung base. 3. Similar appearance chronic right pleural effusion.   Electronically Signed   By: Marin Roberts M.D.   On: 12/18/2014 14:21   Ct Head Wo Contrast  12/18/2014   CLINICAL DATA:  Syncopal episode today.  Fell and hit head.  EXAM: CT HEAD WITHOUT CONTRAST  CT CERVICAL SPINE WITHOUT CONTRAST  TECHNIQUE: Multidetector CT imaging of the head and cervical spine was performed following the standard protocol without intravenous contrast. Multiplanar CT image reconstructions of the cervical spine were also generated.  COMPARISON:  None.  FINDINGS: CT HEAD FINDINGS  The ventricles are in the midline without mass effect or shift. They are within normal limits in size and configuration given the degree of cerebral atrophy. There are remote appearing infarcts noted in the right posterior parietal region, right occipital region and right cerebellum. No findings for acute hemispheric infarction an or intracranial hemorrhage. No extra-axial fluid collections.  No acute skull fracture is identified. The paranasal sinuses and mastoid air cells are clear. The globes are intact.  CT CERVICAL SPINE FINDINGS  Degenerative cervical spondylosis with multilevel disc disease and facet disease. No acute cervical spine fracture. The facets are normally aligned. The skullbase C1 and C1-2 articulations are maintained. The dens is intact. No abnormal prevertebral soft tissue swelling. Mild bony foraminal stenosis at C3-4 and left due to uncinate spurring and facet disease. Similar findings at C5-6.  Emphysematous changes are noted at the lung apices along with scarring changes. A left carotid artery stent is noted.  IMPRESSION: 1. Remote appearing right-sided posterior parietal, occipital and cerebellar infarcts. No definite acute hemispheric infarction or intracranial hemorrhage. No mass lesions or extra-axial fluid collections. 2. No acute skull fracture. 3. Degenerative cervical spondylosis with  multilevel disc disease and facet disease but no acute cervical spine fracture.   Electronically Signed   By: Rudie Meyer M.D.   On: 12/18/2014 14:43   Ct Cervical Spine Wo Contrast  12/18/2014   CLINICAL DATA:  Syncopal episode today.  Fell and hit head.  EXAM: CT HEAD WITHOUT CONTRAST  CT CERVICAL SPINE WITHOUT CONTRAST  TECHNIQUE: Multidetector CT imaging of the head and cervical spine was performed following the standard protocol without intravenous contrast. Multiplanar CT image reconstructions of the cervical spine were also generated.  COMPARISON:  None.  FINDINGS: CT HEAD FINDINGS  The ventricles are in the midline without mass effect or shift. They are within normal limits in size and configuration given the degree of cerebral atrophy. There are remote appearing infarcts noted in the right posterior parietal region, right occipital region and right cerebellum. No findings for acute hemispheric infarction an or intracranial hemorrhage. No extra-axial fluid collections.  No  acute skull fracture is identified. The paranasal sinuses and mastoid air cells are clear. The globes are intact.  CT CERVICAL SPINE FINDINGS  Degenerative cervical spondylosis with multilevel disc disease and facet disease. No acute cervical spine fracture. The facets are normally aligned. The skullbase C1 and C1-2 articulations are maintained. The dens is intact. No abnormal prevertebral soft tissue swelling. Mild bony foraminal stenosis at C3-4 and left due to uncinate spurring and facet disease. Similar findings at C5-6.  Emphysematous changes are noted at the lung apices along with scarring changes. A left carotid artery stent is noted.  IMPRESSION: 1. Remote appearing right-sided posterior parietal, occipital and cerebellar infarcts. No definite acute hemispheric infarction or intracranial hemorrhage. No mass lesions or extra-axial fluid collections. 2. No acute skull fracture. 3. Degenerative cervical spondylosis with  multilevel disc disease and facet disease but no acute cervical spine fracture.   Electronically Signed   By: Rudie Meyer M.D.   On: 12/18/2014 14:43      Medications:     Scheduled Medications: . antiseptic oral rinse  7 mL Mouth Rinse BID  . carvedilol  3.125 mg Oral BID WC  . feeding supplement (ENSURE COMPLETE)  237 mL Oral Daily  . levothyroxine  75 mcg Oral QAC breakfast  . predniSONE  40 mg Oral Q breakfast  . rosuvastatin  40 mg Oral QHS  . sodium chloride  3 mL Intravenous Q12H  . Umeclidinium Bromide  1 puff Inhalation Daily     Infusions:     PRN Medications:  sodium chloride, acetaminophen, ALPRAZolam, fentaNYL (SUBLIMAZE) injection, nitroGLYCERIN, ondansetron (ZOFRAN) IV, oxyCODONE-acetaminophen, sodium chloride, zolpidem   Assessment:   1. Syncope/cardiac arrest 2. Severe low gradient AS 3. Chronic systolic HF     - Ischemic cardiomyopathy. EF 30-35%. He has declined ICD in the past.  4. Acute on CKD stage 4, GFR 15-29: monitor closely with diuresis. (baseline cr = 2.5) 5. CAD, hx of CABG: crestor 40mg  6. PAD 7. COPD with chronic respiratory failure  Plan/Discussion:     Very difficult situation. I suspect he is nearing the end of his life and really has very few options. Neck veins are up and he has mild edema but given worsening renal function would hold diuretics at least one more day as he is preload dependent. Disposition will be a challenge. He clearly cannot go home again. Will need to see if he qualifies for SNF or [facility]. Will ask Palliative Care to see again. Confirmed DRN/DNI status with him and son.   He has been on coumadin for h/o CVA (no h/o PAF) so will stop.   Length of Stay: 1  Tinisha Etzkorn MD 12/19/2014, 1:38 PM  Advanced Heart Failure Team Pager [phone] (M-F; 7a - 4p)  Please contact [facility] for night-coverage after hours (4p -7a ) and weekends on [email or web address]

## 2014-12-19 NOTE — Consult Note (Signed)
Consultation Note Date: 12/19/2014   Patient Name: Clinton [name]  DOB: 08-28-1940  MRN: [id number]  Age / Sex: 74 y.o., male   PCP: No Pcp Per Patient Referring Physician: Quintella Reichert, MD  Reason for Consultation: Establishing goals of care  Palliative Care Assessment and Plan Summary of Established Goals of Care and Medical Treatment Preferences   Clinical Assessment/Narrative: Pt is a 74 yo man with multiple medical co-morbidities principally extensive heart disease and now with CKD stage 4. Pt has ICM, severe aortic stenosis who is not a candidate for AVR. He was seen by Palliative Medicine Team member, Lorinda Creed, NP on 12/12/14 where goals of care decided were DNI, but at that time was amenable to CPR, return hospitalization as well as medical management. Pt lives with son Clinton [name], and on 12/18/14 had syncopal/cardiac arrest in the home. Son started CPR and called 911. By the time EMS arrived pt had a pulse and was conscious. Initial impression is that pt had transient hypotension from poor cardiac out put.Pt is now a DNR after discussion with cardiology. Per cardiology pt unfortunately does not have many treatment options left. Additional concern that pt is at home alone at times when his son works and is at high risk for another acute event. Pt tells me he is tired. He has struggled with worsening debility and loss of functional status. He states since December he really hasn't been able to do a lot of things he would like such as fishing. He still finds meaning in his life thru his family, enjoys crossword puzzles and TV. Pt's affect blunted, he appears tired. He is c/o chest wall pain after CPR. He states the "doctor's told me if Clinton hadn't done CPR I'd be gone". I asked him how he felt about that and he acknowledged he didn't know. At this point his coumadin has been stopped because of his high fall risk. EF 30-35%, in setting of CKD stage 4.   Contacts/Participants in  Discussion: Primary Decision Maker: son, Clinton [name] but shares with his other 2 brothers and sister in [place]. His 3 sons live locally. Pt lives with Clinton [name]: yes . Son, Clinton [name] at the bedside Son, Clinton [name] called this evening and verbalized concern for his brother being able to manage this level of care and stress in the home of pt potentially having another acute episode.  ** Plan to meet son Clinton [name] and Clinton [name] 12/20/14 to further GOC and disposition discussion  Code Status/Advance Care Planning:  DNR   Symptom Management:   Chest wall pain s/p CPR: Cont with tylenol and he reports Percocet has helped the most    Additional Recommendations (Limitations, Scope, Preferences):  Will meet with son, Clinton [name], who is [name] to discuss further goals of care options, disease progression education, disposiiton options as well as son Clinton [name]. Pt in my opinion definately meets hospice in-home/SNF criteria but in-patient may be an option given his risk cardiac arrest and high likelihood of another episode.  Psycho-social/Spiritual:   Support System: family and friends  Desire for further Chaplaincy support:no  Prognosis: Unable to determine . Pt could easily have a prognosis of less than 6 months but given recent cardiac arrest and now acute on chronic kidney disease, would not be surprised if he had a prognosis of days to weeks Discharge Planning:  Undecided at this point. Home with hospice does not seem like a workable option for this family given everyone's work schedule, and inability to provide  24/7 care, but SNF with hospice and potentially in-pateint hospice. Pt is very social and may adjust better in SNF than family's initial concern especially with hospice support.        Chief Complaint/History of Present Illness: Pt is a 74 yo man with ICM, severe aortic stenosis who is not a candidate for AVR, CABG, PAD, bilateral endarterectomy, cardiac stenting and CKD who was just dc'd from  hospital on 12/16/14 after treatment for CHF, now admitted after syncopal/cardiac arrest in the home. Son, Clinton [name] started CPR. Pt had a pulse and was conscious when EMS arrived.  Primary Diagnoses  Present on Admission:  . Acute on chronic systolic and diastolic heart failure, NYHA class 3 . CKD (chronic kidney disease) stage 4, GFR 15-29 ml/min . Essential hypertension . Elevated troponin . Gout  Palliative Review of Systems: Pt verbalizing that his chest is sore from CPR. Not SHOB at rest.. Pt appears sad, tired. He denied he was depressed but is clearly struggling with his decline I have reviewed the medical record, interviewed the patient and family, and examined the patient. The following aspects are pertinent.  Past Medical History  Diagnosis Date  . CAD (coronary artery disease)     a. CABG 1985;  b. 05/2002 Rotablator/PTCA to ostial LCX and RI;  c. 12/2009 MV: inf and lat infarct w/ minimal mid lateral and basilar ant ischemia, EF 29%->Med Rx.  . Cardiomyopathy, ischemic     a. 06/2010 Cardiac MRI: EF 30%, anterolat, post, inf prior subendocardial infarcts;  b. 08/2012 Echo: EF 30-35%, mild LVH mult wma's, Gr 2 DD;  c. 05/2013 Echo: EF 20-25%, diff HK, mod AS, mild MR, mod dil LA, mild to mod TR, PASP .  . Stroke     remote  . Hypertension   . Hyperlipidemia   . Tobacco abuse   . Anticoagulated on Coumadin   . Chronic systolic CHF (congestive heart failure)     a. 05/2013 EF 20-25%, diff HK.  . Moderate aortic stenosis     a. 05/2013 Echo: at least moderate AS (Valve area 1.07 cm2 - VTI; 1.08cm2 - Vmax).  . Hypothyroidism     a. 05/2013 TSH 50.15 - synthroid 50 mcg daily started.  . Peripheral arterial disease     a. Ao-bifem bypass w/ 16x8 hemashield dacron graft, R aortorenal bypass w/ 6mm dacron graft;  b. 09/2011 ABI: R 0/87, L 0.92.  . Carotid artery occlusion     a. 12/2002 R CEA;  b. 05/2007 L Carotid stenting;  c. 09/2008 repeat L carotid stenting 2/2 ISR;  d. 09/2011  Carotid U/S: RICA 40-59%, LICA 60-79%, >50 dist LCCA stenosis.  . CKD (chronic kidney disease), stage III   . Myocardial infarction 12/2009    MV: inf and lat infarct w/ minimal mid lateral and basilar ant ischemia, EF 29%->Med Rx  . Myocardial infarction 1985    "that's why I had the bypass"  . COPD (chronic obstructive pulmonary disease)   . Pneumonia 05/2014  . Arthritis     "hands" (12/03/2014)  . 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 Marital Status: Divorced    Spouse Name: N/A  . Number of Children: N/A  . Years of Education: N/A   Social History Main Topics  . Smoking status: Former Smoker -- 1.00 packs/day for 50 years    Types: Cigarettes    Quit date: 11/26/2014  .  Smokeless tobacco: Never Used  . Alcohol Use: No  . Drug Use: No  . Sexual Activity: No   Other Topics Concern  . None   Social History Narrative   Lives in [place] with his son.  They eat out often and otherwise eat a lot of prepared/processed/microwave-type meals.  He continues to smoke a few cigarettes/day.   Family History  Problem Relation Age of Onset  . CAD    . Stroke Mother   . Heart attack Neg Hx    Scheduled Meds: . antiseptic oral rinse  7 mL Mouth Rinse BID  . feeding supplement (ENSURE COMPLETE)  237 mL Oral Daily  . levothyroxine  75 mcg Oral QAC breakfast  . predniSONE  40 mg Oral Q breakfast  . sodium chloride  3 mL Intravenous Q12H  . Umeclidinium Bromide  1 puff Inhalation Daily   Continuous Infusions:  PRN Meds:.sodium chloride, acetaminophen, ALPRAZolam, fentaNYL (SUBLIMAZE) injection, nitroGLYCERIN, ondansetron (ZOFRAN) IV, oxyCODONE-acetaminophen, sodium chloride, zolpidem Medications Prior to Admission:  Prior to Admission medications   Medication Sig Start Date End Date Taking? Authorizing Provider  acetaminophen (TYLENOL) 500 MG tablet Take 1,000 mg by mouth every 8 (eight) hours as needed (pain).   Yes Historical  Provider, MD  carvedilol (COREG) 3.125 MG tablet Take 1 tablet (3.125 mg total) by mouth 2 (two) times daily with a meal. 12/16/14  Yes Rhonda G Barrett, PA-C  feeding supplement, ENSURE COMPLETE, (ENSURE COMPLETE) LIQD Take 237 mLs by mouth 2 (two) times daily between meals. Patient taking differently: Take 237 mLs by mouth daily.  05/24/14  Yes Starleen Arms, MD  hydrALAZINE (APRESOLINE) 25 MG tablet Take 0.5 tablets (12.5 mg total) by mouth 3 (three) times daily. 12/16/14  Yes Rhonda G Barrett, PA-C  isosorbide mononitrate (IMDUR) 30 MG 24 hr tablet Take 1 tablet (30 mg total) by mouth at bedtime. 12/16/14  Yes Rhonda G Barrett, PA-C  levothyroxine (SYNTHROID, LEVOTHROID) 75 MCG tablet Take 1 tablet (75 mcg total) by mouth daily before breakfast. 07/11/14  Yes Lewayne Bunting, MD  rosuvastatin (CRESTOR) 40 MG tablet Take 1 tablet (40 mg total) by mouth daily. Patient taking differently: Take 40 mg by mouth at bedtime.  06/19/13  Yes Rosalio Macadamia, NP  torsemide (DEMADEX) 20 MG tablet Take 2 tablets (40 mg total) by mouth 2 (two) times daily. 12/16/14  Yes Rhonda G Barrett, PA-C  Umeclidinium Bromide (INCRUSE ELLIPTA) 62.5 MCG/INH AEPB Inhale 1 puff into the lungs daily.   Yes Historical Provider, MD  warfarin (COUMADIN) 6 MG tablet 6 mg on Monday.  3 mg on all other days. 12/16/14  Yes Rhonda G Barrett, PA-C  traMADol (ULTRAM) 50 MG tablet Take 1 tablet (50 mg total) by mouth every 12 (twelve) hours as needed. Patient not taking: Reported on 12/18/2014 12/16/14   Joline Salt Barrett, PA-C   Allergies  Allergen Reactions  . Codeine Phosphate Nausea And Vomiting   CBC:    Component Value Date/Time   WBC 9.4 12/18/2014 1319   HGB 12.2* 12/18/2014 1344   HCT 36.0* 12/18/2014 1344   PLT 167 12/18/2014 1319   MCV 77.5* 12/18/2014 1319   NEUTROABS 7.6 12/18/2014 1319   LYMPHSABS 1.1 12/18/2014 1319   MONOABS 0.7 12/18/2014 1319   EOSABS 0.0 12/18/2014 1319   BASOSABS 0.0 12/18/2014 1319    Comprehensive Metabolic Panel:    Component Value Date/Time   NA 131* 12/19/2014 0653   K 3.9 12/19/2014 0653   CL  90* 12/19/2014 0653   CO2 28 12/19/2014 0653   BUN 110* 12/19/2014 0653   CREATININE 3.02* 12/19/2014 0653   CREATININE 1.49* 07/17/2012 1257   GLUCOSE 101* 12/19/2014 0653   CALCIUM 8.6* 12/19/2014 0653   AST 21 12/19/2014 0653   ALT 19 12/19/2014 0653   ALKPHOS 165* 12/19/2014 0653   BILITOT 1.1 12/19/2014 0653   PROT 6.8 12/19/2014 0653   ALBUMIN 3.1* 12/19/2014 0653    Physical Exam: Vital Signs: BP 115/56 mmHg  Pulse 66  Temp(Src) 97.9 F (36.6 C) (Oral)  Resp 20  Ht 5\' 11"  (1.803 m)  Wt 71.215 kg (157 lb)  BMI 21.91 kg/m2  SpO2 95% SpO2: SpO2: 95 % O2 Device: O2 Device: Nasal Cannula O2 Flow Rate: O2 Flow Rate (L/min): 2 L/min Intake/output summary:  Intake/Output Summary (Last 24 hours) at 12/19/14 1736 Last data filed at 12/19/14 1202  Gross per 24 hour  Intake    542 ml  Output    580 ml  Net    -38 ml   LBM: Last BM Date: 12/18/14 Baseline Weight: Weight: 64.411 kg (142 lb) Most recent weight: Weight: 71.215 kg (157 lb)  Exam Findings:  General: Cachetic older man. A/O x 3. OOB to recliner  Resp: Clear. No work of breathing Cardiac: RRR, no edema Musculoskeletal: MAE x 4  Psych: Affect blunted. Thought processes clear. He denied depression but is verbalzing sadness over his worsening functional status         Palliative Performance Scale: 50-60%              Additional Data Reviewed: Recent Labs     12/18/14  1319  12/18/14  1344  12/19/14  0653  WBC  9.4   --    --   HGB  10.6*  12.2*   --   PLT  167   --    --   NA   --   130*  131*  BUN   --   115*  110*  CREATININE   --   2.90*  3.02*     Time In: 1530 Time Out: 1650 Time Total: 80 min Greater than 50%  of this time was spent counseling and coordinating care related to the above assessment and plan.  Signed by: Irean Hong, NP  Irean Hong, NP   12/19/2014, 5:36 PM  Please contact Palliative Medicine Team phone at [phone] for questions and concerns.

## 2014-12-19 NOTE — Care Management Note (Signed)
Case Management Note  Patient Details  Name: Clinton [name] MRN: [id number] Date of Birth: [birth year]  Subjective/Objective:    Pt admitted for LOC- Pt is from home with son that works 12 hr shifts. Previous admission and was set up with [facility] for HHR/PT services. If pt for home once stable will need resumption of [facility].                 Action/Plan: [facility] aware that pt is hospitalized. Palliative Care has been consulted. Pt will benefit from PT consult for recommendations to see if pt will need higher level of care. CM will continue to monitor.  Expected Discharge Date:                  Expected Discharge Plan:  Home w Home Health Services  In-House Referral:     Discharge planning Services  CM Consult  Post Acute Care Choice:  Home Health, Resumption of Svcs/PTA Provider Choice offered to:     DME Arranged:    DME Agency:     HH Arranged:    HH Agency:     Status of Service:     Medicare Important Message Given:  Yes Date Medicare IM Given:  12/19/14 Medicare IM give by:  Tomi Bamberger, RN,BSN  Date Additional Medicare IM Given:    Additional Medicare Important Message give by:     If discussed at Long Length of Stay Meetings, dates discussed:    Additional Comments:  Gala Lewandowsky, RN 12/19/2014, 4:56 PM

## 2014-12-19 NOTE — Progress Notes (Addendum)
Spiritual care relayed pt and family are leaning towards wanting comfort measures only. Emelda Brothers RN

## 2014-12-19 NOTE — Progress Notes (Signed)
ANTICOAGULATION CONSULT NOTE - Initial Consult  Pharmacy Consult for sq heparin for dvt px   Allergies  Allergen Reactions  . Codeine Phosphate Nausea And Vomiting    Patient Measurements: Height:  (180.3 cm) Weight: 157 lb (71.215 kg) IBW/kg (Calculated) : 75.3 Heparin Dosing Weight:   Vital Signs: Temp: 97.9 F (36.6 C) (06/24 0538) Temp Source: Oral (06/24 0538) BP: 115/56 mmHg (06/24 0538) Pulse Rate: 66 (06/24 0538)  Labs:  Recent Labs  12/18/14 1319 12/18/14 1344 12/18/14 2032 12/19/14 0115 12/19/14 0653  HGB 10.6* 12.2*  --   --   --   HCT 33.1* 36.0*  --   --   --   PLT 167  --   --   --   --   LABPROT 25.7*  --   --   --  29.0*  INR 2.38*  --   --   --  2.79*  CREATININE  --  2.90*  --   --  3.02*  TROPONINI  --   --  0.98* 0.89* 0.83*    Estimated Creatinine Clearance: 21.6 mL/min (by C-G formula based on Cr of 3.02).   Medical History: Past Medical History  Diagnosis Date  . CAD (coronary artery disease)     a. CABG 1985;  b. 05/2002 Rotablator/PTCA to ostial LCX and RI;  c. 12/2009 MV: inf and lat infarct w/ minimal mid lateral and basilar ant ischemia, EF 29%->Med Rx.  . Cardiomyopathy, ischemic     a. 06/2010 Cardiac MRI: EF 30%, anterolat, post, inf prior subendocardial infarcts;  b. 08/2012 Echo: EF 30-35%, mild LVH mult wma's, Gr 2 DD;  c. 05/2013 Echo: EF 20-25%, diff HK, mod AS, mild MR, mod dil LA, mild to mod TR, PASP .  . Stroke     remote  . Hypertension   . Hyperlipidemia   . Tobacco abuse   . Anticoagulated on Coumadin   . Chronic systolic CHF (congestive heart failure)     a. 05/2013 EF 20-25%, diff HK.  . Moderate aortic stenosis     a. 05/2013 Echo: at least moderate AS (Valve area 1.07 cm2 - VTI; 1.08cm2 - Vmax).  . Hypothyroidism     a. 05/2013 TSH 50.15 - synthroid 50 mcg daily started.  . Peripheral arterial disease     a. Ao-bifem bypass w/ 16x8 hemashield dacron graft, R aortorenal bypass w/ 6mm dacron graft;   b. 09/2011 ABI: R 0/87, L 0.92.  . Carotid artery occlusion     a. 12/2002 R CEA;  b. 05/2007 L Carotid stenting;  c. 09/2008 repeat L carotid stenting 2/2 ISR;  d. 09/2011 Carotid U/S: RICA 40-59%, LICA 60-79%, >50 dist LCCA stenosis.  . CKD (chronic kidney disease), stage III   . Myocardial infarction 12/2009    MV: inf and lat infarct w/ minimal mid lateral and basilar ant ischemia, EF 29%->Med Rx  . Myocardial infarction 1985    "that's why I had the bypass"  . COPD (chronic obstructive pulmonary disease)   . Pneumonia 05/2014  . Arthritis     "hands" (12/03/2014)  . 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 male with a history of CABG, PAD s/p Ao-bifem bypass and Ao-R renal bypass, carotid stenosis s/p bilateral CEA, LICA and L CCA stenting, systolic HF EF 30%, severe aortic stenosis, and CKD (baseline Cr ~2.5). He was on coumadin pta for a history of CVA.  D/w Dr.Bensimhon today on rounds, patient's disposition is currently being discussed with family. Given recent CPR and extensive bruising/fall will hold off on restarting coumadin at this time. He dose wish pharmacy to start pharmacologic dvt px once his INR normalized to below 2. Will plan on starting sq heparin as his elevated scr would make lovenox kinetics problematic.  Will order INR daily x 3 days and hope INR trends down in that time to start heparin.  Sheppard Coil PharmD., BCPS Clinical Pharmacist Pager [phone] 12/19/2014 2:41 PM

## 2014-12-19 NOTE — Progress Notes (Signed)
UR Completed Shaya Reddick Graves-Bigelow, RN,BSN [phone]  

## 2014-12-19 NOTE — Progress Notes (Signed)
   12/19/14 1100  Clinical Encounter Type  Visited With Patient and family together  Visit Type Spiritual support;Social support  Referral From Nurse  Consult/Referral To Chaplain  Spiritual Encounters  Spiritual Needs Emotional;Prayer  Stress Factors  Patient Stress Factors Major life changes  CH respond to referral from RN; PT prayer request; met with PT & son; discussed current status; PT and family would like progress and additional update on status and goals of care.

## 2014-12-19 NOTE — Progress Notes (Signed)
[facility]  Patient Status: Active (receiving services up to time of hospitalization)  [facility] is providing the following services: RN and PT  Referred to [facility] but readmitted to Hospital prior to start of services.  If patient discharges after hours, please call [phone].   Clinton [name] 12/19/2014, 9:31 AM

## 2014-12-20 LAB — PROTIME-INR
INR: 2.56 — ABNORMAL HIGH (ref 0.00–1.49)
Prothrombin Time: 27.2 seconds — ABNORMAL HIGH (ref 11.6–15.2)

## 2014-12-20 MED ORDER — DIPHENHYDRAMINE HCL 25 MG PO CAPS
25.0000 mg | ORAL_CAPSULE | Freq: Four times a day (QID) | ORAL | Status: DC | PRN
  Administered 2014-12-20 (×2): 25 mg via ORAL
  Filled 2014-12-20 (×2): qty 1

## 2014-12-20 MED ORDER — CALCIUM CARBONATE ANTACID 500 MG PO CHEW
1.0000 | CHEWABLE_TABLET | Freq: Once | ORAL | Status: DC
Start: 2014-12-20 — End: 2014-12-23

## 2014-12-20 NOTE — Evaluation (Signed)
Physical Therapy Evaluation Patient Details Name: Clinton [name] MRN: [id number] DOB: 25-Aug-1940 Today's Date: 12/20/2014   History of Present Illness  Pt arrived from home with son by [facility] with c/o syncopal episode. Pt son stated to EMS that pt was walking to throw trash away and collapsed to floor unresponsive. Pt son started CPR on pt, first EMS arrived and stated that pt had a pulse with agonal respirations. Second EMS arrived and pt has been alert and talking ever since their arrival. Pt has abrasion to right side of forehead along with some swelling and brusing, skin tear to bilateral upper arms bleeding controlled. Pt is on Coumadin and took medication this morning. Pt c/o some chest discomfort possibly d/t chest compressions. Pt recently d/c from hospital after 2 week stay. PMH: CAD, s/p CABG,  carotid stenosis, HTN, ICM, systolic HF, aortic stenosis, prior CVA, CKD, PAD  Clinical Impression  Pt admitted with above diagnosis. Pt currently with functional limitations due to the deficits listed below (see PT Problem List). Pt will benefit from skilled PT to increase their independence and safety with mobility to allow discharge to the venue listed below.  Pt able to ambulate in room with RW with standing rest breaks.  o2 did decrease on room air to 87%, but with 2 L/min was 95-97%.  Pt is unsure about d/c plans and noted family meeting regarding palliative or hospice.  Will adjust plan of care as needed based on d/c plans.     Follow Up Recommendations Other (comment) (To be determined- family meeting with palliative and considering hospice as well per notes)    Equipment Recommendations  None recommended by PT    Recommendations for Other Services       Precautions / Restrictions Precautions Precautions: Fall Restrictions Weight Bearing Restrictions: No      Mobility  Bed Mobility               General bed mobility comments: in recliner upon  arrival  Transfers Overall transfer level: Needs assistance Equipment used: Rolling walker (2 wheeled) Transfers: Sit to/from Stand Sit to Stand: Supervision         General transfer comment: S with cues for hand placement  Ambulation/Gait   Ambulation Distance (Feet): 24 Feet (plus 72) Assistive device: Rolling walker (2 wheeled) Gait Pattern/deviations: Step-through pattern;Decreased step length - right;Decreased step length - left     General Gait Details: Amb 24 feet in room on RA wuth o2 decreasing to 87%.  Sitting rest break and applied o2.  Ambulated laps in room and did ~ 72 more feet with RW and slow gait pattern with no c/o pain in legs.  Stairs            Wheelchair Mobility    Modified Rankin (Stroke Patients Only)       Balance             Standing balance-Leahy Scale: Fair Standing balance comment: Able to perfom static standing without RW, but needs support for dynamic activities                             Pertinent Vitals/Pain Pain Assessment: 0-10 Pain Score: 8  Pain Location: R rib pain with breathing Pain Intervention(s): Monitored during session;Limited activity within patient's tolerance o2 on RA at rest 94% o2 on RA with gait 87% o2 on 2L/min 95-97%    Home Living Family/patient expects to be discharged to::  Private residence Living Arrangements: Children (son) Available Help at Discharge: Family;Available PRN/intermittently (son works nights 3x/week) Type of Home: House Home Access: Stairs to enter;Level entry     Home Layout: One level Home Equipment: Environmental consultant - 2 wheels;Cane - single point      Prior Function Level of Independence: Independent with assistive device(s)         Comments: been using RW last week since hospitalization     Hand Dominance   Dominant Hand: Right    Extremity/Trunk Assessment   Upper Extremity Assessment: Generalized weakness;Overall [facility] for tasks assessed            Lower Extremity Assessment: Generalized weakness;Overall [facility] for tasks assessed      Cervical / Trunk Assessment: Kyphotic  Communication   Communication: HOH  Cognition Arousal/Alertness: Awake/alert Behavior During Therapy: WFL for tasks assessed/performed Overall Cognitive Status: Within Functional Limits for tasks assessed                      General Comments      Exercises        Assessment/Plan    PT Assessment Patient needs continued PT services  PT Diagnosis Generalized weakness   PT Problem List Decreased strength;Decreased activity tolerance;Cardiopulmonary status limiting activity  PT Treatment Interventions Gait training;Stair training;Functional mobility training;Balance training;Therapeutic exercise;Patient/family education   PT Goals (Current goals can be found in the Care Plan section) Acute Rehab PT Goals Patient Stated Goal: "I don't really know" PT Goal Formulation: With patient Time For Goal Achievement: 2015/01/08 Potential to Achieve Goals: Good    Frequency Min 3X/week   Barriers to discharge        Co-evaluation               End of Session Equipment Utilized During Treatment: Oxygen Activity Tolerance: Patient tolerated treatment well;Patient limited by fatigue Patient left: in chair;with call bell/phone within reach Nurse Communication: Mobility status         Time: 6962-9528 PT Time Calculation (min) (ACUTE ONLY): 27 min   Charges:   PT Evaluation $Initial PT Evaluation Tier I: 1 Procedure PT Treatments $Gait Training: 8-22 mins   PT G Codes:        Seila Liston LUBECK 12/20/2014, 10:59 AM

## 2014-12-20 NOTE — Progress Notes (Signed)
Subjective:    Clinton [name] is a 74 y.o. male with a history of CABG, PAD s/p Ao-bifem bypass and Ao-R renal bypass, carotid stenosis s/p bilateral CEA, LICA and L CCA stenting, systolic HF EF 30%, severe aortic stenosis, and CKD (baseline Cr ~2.5). He is on coumadin for a history of CVA. (No mention of PAF in chart)  He was discharged 06/21 after a 13 day hospital stay for cardiogenic shock in etting of severe low gradient AS. Treted with inotropes and IV lasix. Felt not to be candidate for TAVR due to renal failure and severe COPD.  Was made DNR. Admitted recently with syncope/cardiac arrest with CPR by his son.   Feels ok this am.  Denies dyspnea.     Objective:   Weight Range:  Vital Signs:   Temp:  [97.5 F (36.4 C)-97.7 F (36.5 C)] 97.7 F (36.5 C) (06/24 2356) Pulse Rate:  [66-74] 66 (06/25 0900) Resp:  [13-21] 14 (06/25 0900) BP: (114-149)/(56-97) 114/56 mmHg (06/25 0900) SpO2:  [86 %-98 %] 95 % (06/25 0900) Weight:  [71.169 kg (156 lb 14.4 oz)] 71.169 kg (156 lb 14.4 oz) (06/25 0535) Last BM Date: 12/19/14  Weight change: Filed Weights   12/18/14 1211 12/19/14 0538 12/20/14 0535  Weight: 64.411 kg (142 lb) 71.215 kg (157 lb) 71.169 kg (156 lb 14.4 oz)    Intake/Output:   Intake/Output Summary (Last 24 hours) at 12/20/14 0959 Last data filed at 12/20/14 0600  Gross per 24 hour  Intake   1090 ml  Output   1255 ml  Net   -165 ml     Physical Exam: General: Elderly. Sitting in chair. NAD HEENT: ecchymosis on R eye and forehead Neck: supple. JVP to jaw cm. Carotids 2+ bilat; no bruits. No lymphadenopathy or thryomegaly appreciated. Cor: PMI nondisplaced. Regular rate & rhythm. 2/6 AS with moderately depressed S2, 2/6 MR Lungs: Bibasilar crackles/diminished  Abdomen: nontender. soft. Midline hernia present, easily reducible, non tender. No hepatosplenomegaly. No bruits or masses. Good bowel sounds. Extremities: no cyanosis, clubbing, rash. 1+ woody  edema Neuro: alert & orientedx3, cranial nerves grossly intact. moves all 4 extremities w/o difficulty. Affect sad Telemetry: SR   Labs: Basic Metabolic Panel:  Recent Labs Lab 12/14/14 0521 12/15/14 0400 12/16/14 0523 12/18/14 1344 12/19/14 0653  NA 133* 132* 133* 130* 131*  K 3.8 3.6 4.4 3.7 3.9  CL 87* 86* 88* 90* 90*  CO2 33* 33* 30  --  28  GLUCOSE 97 125* 104* 131* 101*  BUN 94* 91* 98* 115* 110*  CREATININE 2.48* 2.51* 2.66* 2.90* 3.02*  CALCIUM 8.8* 8.5* 9.0  --  8.6*    Liver Function Tests:  Recent Labs Lab 12/19/14 0653  AST 21  ALT 19  ALKPHOS 165*  BILITOT 1.1  PROT 6.8  ALBUMIN 3.1*   No results for input(s): LIPASE, AMYLASE in the last 168 hours. No results for input(s): AMMONIA in the last 168 hours.  CBC:  Recent Labs Lab 12/16/14 0523 12/18/14 1319 12/18/14 1344  WBC 10.6* 9.4  --   NEUTROABS  --  7.6  --   HGB 11.4* 10.6* 12.2*  HCT 36.7* 33.1* 36.0*  MCV 79.1 77.5*  --   PLT 193 167  --     Cardiac Enzymes:  Recent Labs Lab 12/18/14 2032 12/19/14 0115 12/19/14 0653  TROPONINI 0.98* 0.89* 0.83*    BNP: BNP (last 3 results)  Recent Labs  12/03/14 1909  BNP >4500.0*  ProBNP (last 3 results)  Recent Labs  05/10/14 0935  PROBNP 16109.6*       Medications:     Scheduled Medications: . antiseptic oral rinse  7 mL Mouth Rinse BID  . feeding supplement (ENSURE COMPLETE)  237 mL Oral Daily  . levothyroxine  75 mcg Oral QAC breakfast  . predniSONE  40 mg Oral Q breakfast  . sodium chloride  3 mL Intravenous Q12H  . Umeclidinium Bromide  1 puff Inhalation Daily    Infusions:    PRN Medications: sodium chloride, acetaminophen, ALPRAZolam, docusate sodium, fentaNYL (SUBLIMAZE) injection, nitroGLYCERIN, ondansetron (ZOFRAN) IV, oxyCODONE-acetaminophen, sodium chloride, zolpidem   Assessment:   1. Syncope/cardiac arrest 2. Severe low gradient AS 3. Chronic systolic HF     - Ischemic cardiomyopathy. EF  30-35%. He has declined ICD in the past.  4. Acute on CKD stage 4, GFR 15-29: monitor closely with diuresis. (baseline cr = 2.5) 5. CAD, hx of CABG: crestor  6. PAD 7. COPD with chronic respiratory failure  Plan/Discussion:    Poor prognosis.  I agree with palliative efforts.  Palliative care has seen the patient. Would keep Is and Os about even--> creatinine is worse today.  He is preload dependant but also has elevated right heart pressures by exam. Comfort measures are appropriate  Length of Stay: 2  Hillis Range MD 12/20/2014, 9:59 AM

## 2014-12-20 NOTE — Progress Notes (Signed)
Daily Progress Note   Patient Name: Clinton [name]       Date: 12/20/2014 DOB: Nov 16, 1940  Age: 74 y.o. MRN#: [id number] Attending Physician: Sueanne Margarita, MD Primary Care Physician: No PCP Per Patient Admit Date: 12/18/2014  Reason for Consultation/Follow-up: Establishing goals of care. Plan to meet with pt and son Clinton [name] 12/21/14 at 0900 to discuss disease progression and EOL care, disposition  Subjective: Pt is a 74 yo man admitted after cardiac arrest in the home. He has extensive heart disease, ICM, CVA, CABG and now acute on chronic renal failure. He was just dc'd after a 13 day hospital stay on 12/16/14. He was seen by Palliative Care team member Wadie Lessen, NP for [facility] at that time. Pt is now a DNR. Hen is still verbalizing chest pain after receiving CPR but slept better. He drifts off to sleep often in conversation Interval Events: Met with son Clinton [name]. Reviewed disease progression related to cardio pulmonary failure as well as now acute on chronic kidney failure. Also supported Clinton [name] who is still reeling from having found his father unresponsive and administered CPR. Clinton [name]  recognizes that Mr. Mangual can no longer stay  in the home with him and that he now requires 24/7 care.  Length of Stay: 2 days  Current Medications: Scheduled Meds:  . antiseptic oral rinse  7 mL Mouth Rinse BID  . feeding supplement (ENSURE COMPLETE)  237 mL Oral Daily  . levothyroxine  75 mcg Oral QAC breakfast  . predniSONE  40 mg Oral Q breakfast  . sodium chloride  3 mL Intravenous Q12H  . Umeclidinium Bromide  1 puff Inhalation Daily    Continuous Infusions:    PRN Meds: sodium chloride, acetaminophen, ALPRAZolam, docusate sodium, fentaNYL (SUBLIMAZE) injection, nitroGLYCERIN, ondansetron (ZOFRAN) IV, oxyCODONE-acetaminophen, sodium chloride, zolpidem  Palliative Performance Scale: 50-60%     Vital Signs: BP 133/63 mmHg  Pulse 68  Temp(Src) 97.7 F (36.5 C) (Oral)  Resp 15  Ht 5' 11"  (1.803 m)  Wt 71.169 kg (156 lb 14.4 oz)  BMI 21.89 kg/m2  SpO2 92% SpO2: SpO2: 92 % O2 Device: O2 Device: Nasal Cannula O2 Flow Rate: O2 Flow Rate (L/min): 2 L/min  Intake/output summary:  Intake/Output Summary (Last 24 hours) at 12/20/14 0840 Last data filed at 12/20/14 0600  Gross per 24 hour  Intake   1330 ml  Output   1355 ml  Net    -25 ml   LBM:   Baseline Weight: Weight: 64.411 kg (142 lb) Most recent weight: Weight: 71.169 kg (156 lb 14.4 oz)  Physical Exam: General: Pt OOB to recliner. Alert but easily drifts off to sleep Resp: Diminished. Poor resp effort but no work of breathing observed Cardiac: RRR; 3/6 murmer              Additional Data Reviewed: Recent Labs     12/18/14  1319  12/18/14  1344  12/19/14  0653  WBC  9.4   --    --   HGB  10.6*  12.2*   --   PLT  167   --    --   NA   --   130*  131*  BUN   --   115*  110*  CREATININE   --   2.90*  3.02*     Problem List:  Patient Active Problem List   Diagnosis Date Noted  . Aortic stenosis, severe 12/18/2014  . Cardiac arrest 12/18/2014  . Elevated  troponin 12/18/2014  . Chronic anticoagulation - Coumadin 12/18/2014  . Gout 12/18/2014  . Palliative care encounter 12/12/2014  . Weakness generalized 12/12/2014  . DNR (do not resuscitate) discussion 12/12/2014  . Acute on chronic systolic and diastolic heart failure, NYHA class 3 12/05/2014  . CKD (chronic kidney disease) stage 4, GFR 15-29 ml/min   . Leg swelling   . Bilateral pleural effusion 08/01/2014  . COPD (chronic obstructive pulmonary disease) 08/01/2014  . Lung nodule 06/11/2014  . Orthostatic hypotension   . Transaminitis   . CAP (community acquired pneumonia) 05/10/2014  . Demand ischemia 05/10/2014  . Encounter for therapeutic drug monitoring 08/08/2013  . Hypothyroidism   . Acute on chronic systolic CHF (congestive heart failure) 06/05/2013  . Aortoiliac occlusive disease 10/21/2011  . Atherosclerosis of native arteries of the  extremities with intermittent claudication 10/21/2011  . Renal artery stenosis 10/21/2011  . Long term current use of anticoagulant therapy 09/30/2010  . Mitral valve regurgitation 08/23/2010  . CAROTID ARTERY STENOSIS 10/10/2008  . Hyperlipidemia 06/30/2008  . TOBACCO ABUSE 06/30/2008  . Essential hypertension 06/30/2008  . CAD, ARTERY BYPASS GRAFT 06/30/2008  . CARDIOMYOPATHY, ISCHEMIC 06/30/2008  . CVA 06/30/2008  . Peripheral vascular disease 06/30/2008     Palliative Care Assessment & Plan    Code Status:  DNR  Goals of Care:  No feeding tubes , dialysis or other aggressive measures. Desires comfort and dignity approach  Desire for further Chaplaincy support:yes  3. Symptom Management:  Chest pain s/p CPR: Cont percocet and fentanyl ,tlenol prn. Monitor for need for scheduled dosing   5. Prognosis: < 6 months. Pt clearly is hospice appropriate but he is very high risk for sudden cardiac death as exhibited by events on 12/18/14 where he did arrest. He now no longer wishes CPR  5. Discharge Planning: In the process of meeting with family and pt to discuss SNF with hospice or an in-patient hospice if he meets criteria   Care plan was discussed with sons, Clinton [name]. Plan to meet with son Clinton [name] 12/21/14 at 0900  Thank you for allowing the Palliative Medicine Team to assist in the care of this patient.   Time In: 0730 Time Out: 0830 Total Time 60 Prolonged Time Billed  no    Greater than 50%  of this time was spent counseling and coordinating care related to the above assessment and plan.   Dory Horn, NP  12/20/2014, 8:40 AM  Please contact Palliative Medicine Team phone at [phone] for questions and concerns.

## 2014-12-21 LAB — PROTIME-INR
INR: 2.62 — ABNORMAL HIGH (ref 0.00–1.49)
Prothrombin Time: 27.7 seconds — ABNORMAL HIGH (ref 11.6–15.2)

## 2014-12-21 NOTE — Progress Notes (Signed)
Subjective:    Clinton [name] is a 74 y.o. male with a history of CABG, PAD s/p Ao-bifem bypass and Ao-R renal bypass, carotid stenosis s/p bilateral CEA, LICA and L CCA stenting, systolic HF EF 22%, severe aortic stenosis, and CKD (baseline Cr ~2.5). He is on coumadin for a history of CVA. (No mention of PAF in chart)  He was discharged 06/21 after a 13 day hospital stay for cardiogenic shock in etting of severe low gradient AS. Treted with inotropes and IV lasix. Felt not to be candidate for TAVR due to renal failure and severe COPD.  Was made DNR. Admitted recently with syncope/cardiac arrest with CPR by his son.   Feels ok this am.  Denies dyspnea.  Met with palliative care team this am and feels good about options presented.    Objective:   Weight Range:  Vital Signs:   Temp:  [97.6 F (36.4 C)] 97.6 F (36.4 C) (06/25 2359) Pulse Rate:  [60-80] 80 (06/26 0154) Resp:  [11-23] 23 (06/26 0154) BP: (123-146)/(75-81) 123/81 mmHg (06/26 0154) SpO2:  [95 %-98 %] 95 % (06/26 0154) Weight:  [71.215 kg (157 lb)] 71.215 kg (157 lb) (06/26 0500) Last BM Date: 12/19/14  Weight change: Filed Weights   12/19/14 0538 12/20/14 0535 12/21/14 0500  Weight: 71.215 kg (157 lb) 71.169 kg (156 lb 14.4 oz) 71.215 kg (157 lb)    Intake/Output:   Intake/Output Summary (Last 24 hours) at 12/21/14 1033 Last data filed at 12/21/14 0600  Gross per 24 hour  Intake    723 ml  Output    875 ml  Net   -152 ml     Physical Exam: General: Elderly. Sitting in chair. NAD HEENT: ecchymosis on R eye and forehead Neck: supple. JVP to jaw cm. Carotids 2+ bilat; no bruits. No lymphadenopathy or thryomegaly appreciated. Cor: PMI nondisplaced. Regular rate & rhythm. 2/6 AS with moderately depressed S2, 2/6 MR Lungs: Bibasilar crackles/diminished  Abdomen: nontender. soft. Midline hernia present, easily reducible, non tender. No hepatosplenomegaly. No bruits or masses. Good bowel  sounds. Extremities: no cyanosis, clubbing, rash. 1+ woody edema Neuro: alert & orientedx3, cranial nerves grossly intact. moves all 4 extremities w/o difficulty. Affect sad Telemetry: SR   Labs: Basic Metabolic Panel:  Recent Labs Lab 12/15/14 0400 12/16/14 0523 12/18/14 1344 12/19/14 0653  NA 132* 133* 130* 131*  K 3.6 4.4 3.7 3.9  CL 86* 88* 90* 90*  CO2 33* 30  --  28  GLUCOSE 125* 104* 131* 101*  BUN 91* 98* 115* 110*  CREATININE 2.51* 2.66* 2.90* 3.02*  CALCIUM 8.5* 9.0  --  8.6*    Liver Function Tests:  Recent Labs Lab 12/19/14 0653  AST 21  ALT 19  ALKPHOS 165*  BILITOT 1.1  PROT 6.8  ALBUMIN 3.1*   No results for input(s): LIPASE, AMYLASE in the last 168 hours. No results for input(s): AMMONIA in the last 168 hours.  CBC:  Recent Labs Lab 12/16/14 0523 12/18/14 1319 12/18/14 1344  WBC 10.6* 9.4  --   NEUTROABS  --  7.6  --   HGB 11.4* 10.6* 12.2*  HCT 36.7* 33.1* 36.0*  MCV 79.1 77.5*  --   PLT 193 167  --     Cardiac Enzymes:  Recent Labs Lab 12/18/14 2032 12/19/14 0115 12/19/14 0653  TROPONINI 0.98* 0.89* 0.83*    BNP: BNP (last 3 results)  Recent Labs  12/03/14 1909  BNP >4500.0*    ProBNP (  last 3 results)  Recent Labs  05/10/14 0935  PROBNP 48889.1*       Medications:     Scheduled Medications: . antiseptic oral rinse  7 mL Mouth Rinse BID  . calcium carbonate  1 tablet Oral Once  . feeding supplement (ENSURE COMPLETE)  237 mL Oral Daily  . levothyroxine  75 mcg Oral QAC breakfast  . predniSONE  40 mg Oral Q breakfast  . sodium chloride  3 mL Intravenous Q12H  . Umeclidinium Bromide  1 puff Inhalation Daily    Infusions:    PRN Medications: sodium chloride, acetaminophen, ALPRAZolam, diphenhydrAMINE, docusate sodium, fentaNYL (SUBLIMAZE) injection, nitroGLYCERIN, ondansetron (ZOFRAN) IV, oxyCODONE-acetaminophen, sodium chloride, zolpidem   Assessment:   1. Syncope/cardiac arrest 2. Severe low  gradient AS 3. Chronic systolic HF     - Ischemic cardiomyopathy. EF 30-35%. He has declined ICD in the past.  4. Acute on CKD stage 4, GFR 15-29: (baseline cr = 2.5) 5. CAD, hx of CABG: crestor $RemoveBefor'40mg'[id number]$  6. PAD 7. COPD with chronic respiratory failure  Plan/Discussion:    Poor prognosis.  I agree with palliative efforts.  Palliative care has seen the patient this am and are looking at efforts of SNF with hospice vs home with hospice.  Pt and family are on board with this plan. Would keep Is and Os about even.   He is preload dependant but also has elevated right heart pressures by exam. Comfort measures are appropriate Repeat bmet tomorrow am.  Length of Stay: 3  Thompson Grayer MD 12/21/2014, 10:33 AM

## 2014-12-21 NOTE — Progress Notes (Signed)
Daily Progress Note   Patient Name: Clinton [name]       Date: 12/21/2014 DOB: 10-30-1940  Age: 74 y.o. MRN#: [id number] Attending Physician: Quintella Reichert, MD Primary Care Physician: No PCP Per Patient Admit Date: 12/18/2014  Reason for Consultation/Follow-up: Establishing goals of care  Subjective: Pt OOB to recliner this am. Still expressing soreness to chest after chest compressions. No dyspnea. He did meet with PT yesterday and was up in room with rolling walker. He did desat to 87% on RA but remained 95% on 2L. Eating small amounts. Met pt's other son, Harvie Heck, in the room to present option after discharge. Had a frank and respectful conversation about concerns about pt staying alone given functional decline , fragility of health, and recent arrest. Pt naturally wants to go back home but recognizes that he cannot at this point. He also is concerned about his son with whom he lives, well being after administering CPR. Pt is very alert, oriented and states when he was in SNF for rehab before the social aspect was good for him. Both he and his son agreed to SNF with trialof rehab. I did review that after pt no longer had a skilled need, that he could then elect his hospice medicare benefit. Pt's son ultimately at EOL would like to see his father admitted to hospice in-patient facilty Interval Events: none Length of Stay: 3 days  Current Medications: Scheduled Meds:  . antiseptic oral rinse  7 mL Mouth Rinse BID  . calcium carbonate  1 tablet Oral Once  . feeding supplement (ENSURE COMPLETE)  237 mL Oral Daily  . levothyroxine  75 mcg Oral QAC breakfast  . predniSONE  40 mg Oral Q breakfast  . sodium chloride  3 mL Intravenous Q12H  . Umeclidinium Bromide  1 puff Inhalation Daily    Continuous Infusions:    PRN Meds: sodium chloride, acetaminophen, ALPRAZolam, diphenhydrAMINE, docusate sodium, fentaNYL (SUBLIMAZE) injection, nitroGLYCERIN, ondansetron (ZOFRAN) IV,  oxyCODONE-acetaminophen, sodium chloride, zolpidem  Palliative Performance Scale: 50-60%     Vital Signs: BP 123/81 mmHg  Pulse 80  Temp(Src) 97.6 F (36.4 C) (Oral)  Resp 23  Ht 5\' 11"  (1.803 m)  Wt 71.215 kg (157 lb)  BMI 21.91 kg/m2  SpO2 95% SpO2: SpO2: 95 % O2 Device: O2 Device: Nasal Cannula O2 Flow Rate: O2 Flow Rate (L/min): 2 L/min  Intake/output summary:  Intake/Output Summary (Last 24 hours) at 12/21/14 1524 Last data filed at 12/21/14 1300  Gross per 24 hour  Intake    963 ml  Output   1200 ml  Net   -237 ml   LBM:   Baseline Weight: Weight: 64.411 kg (142 lb) Most recent weight: Weight: 71.215 kg (157 lb)  Physical Exam: General : Older man, OOB to recliner. No acute distress Resp: No work of breathing observed Musculoskeletal: MAE x 4              Additional Data Reviewed: Recent Labs     12/19/14  0653  NA  131*  BUN  110*  CREATININE  3.02*     Problem List:  Patient Active Problem List   Diagnosis Date Noted  . Aortic stenosis, severe 12/18/2014  . Cardiac arrest 12/18/2014  . Elevated troponin 12/18/2014  . Chronic anticoagulation - Coumadin 12/18/2014  . Gout 12/18/2014  . Palliative care encounter 12/12/2014  . Weakness generalized 12/12/2014  . DNR (do not resuscitate) discussion 12/12/2014  . Acute on chronic systolic and diastolic  heart failure, NYHA class 3 12/05/2014  . CKD (chronic kidney disease) stage 4, GFR 15-29 ml/min   . Leg swelling   . Bilateral pleural effusion 08/01/2014  . COPD (chronic obstructive pulmonary disease) 08/01/2014  . Lung nodule 06/11/2014  . Orthostatic hypotension   . Transaminitis   . CAP (community acquired pneumonia) 05/10/2014  . Demand ischemia 05/10/2014  . Encounter for therapeutic drug monitoring 08/08/2013  . Hypothyroidism   . Acute on chronic systolic CHF (congestive heart failure) 06/05/2013  . Aortoiliac occlusive disease 10/21/2011  . Atherosclerosis of native arteries of the  extremities with intermittent claudication 10/21/2011  . Renal artery stenosis 10/21/2011  . Long term current use of anticoagulant therapy 09/30/2010  . Mitral valve regurgitation 08/23/2010  . CAROTID ARTERY STENOSIS 10/10/2008  . Hyperlipidemia 06/30/2008  . TOBACCO ABUSE 06/30/2008  . Essential hypertension 06/30/2008  . CAD, ARTERY BYPASS GRAFT 06/30/2008  . CARDIOMYOPATHY, ISCHEMIC 06/30/2008  . CVA 06/30/2008  . Peripheral vascular disease 06/30/2008     Palliative Care Assessment & Plan    Code Status:  DNR  Goals of Care:  Ultimate goal is to not return to hospital and to be comfortable. At this point he is agreeable to SNF with rehab but recognizes that his ability to get better and go home is unlikely given severity of cardiac disease as well as COPD and now renal failure. Family will elect hospice benefit after skilled days  Desire for further Chaplaincy support:no  3. Symptom Management:  Pain: Cont with PRN percocet and fentanyl IV   5. Prognosis: < 6 months  5. Discharge Planning: [facility] for rehab with Palliative care service follow-up   Care plan was discussed with son Louie Casa as well as pt  Thank you for allowing the Palliative Medicine Team to assist in the care of this patient.   Time In: 0900 Time Out: 1000 Total Time 60 min Prolonged Time Billed  no     Greater than 50%  of this time was spent counseling and coordinating care related to the above assessment and plan.   Dory Horn, NP  12/21/2014, 3:24 PM  Please contact Palliative Medicine Team phone at [phone] for questions and concerns.

## 2014-12-22 ENCOUNTER — Inpatient Hospital Stay (HOSPITAL_COMMUNITY): Payer: Medicare HMO

## 2014-12-22 LAB — BASIC METABOLIC PANEL
ANION GAP: 12 (ref 5–15)
BUN: 101 mg/dL — ABNORMAL HIGH (ref 6–20)
CALCIUM: 9 mg/dL (ref 8.9–10.3)
CO2: 25 mmol/L (ref 22–32)
Chloride: 93 mmol/L — ABNORMAL LOW (ref 101–111)
Creatinine, Ser: 2.43 mg/dL — ABNORMAL HIGH (ref 0.61–1.24)
GFR calc non Af Amer: 25 mL/min — ABNORMAL LOW (ref >60)
GFR, EST AFRICAN AMERICAN: 29 mL/min — AB (ref >60)
Glucose, Bld: 143 mg/dL — ABNORMAL HIGH (ref 65–99)
Potassium: 4.9 mmol/L (ref 3.5–5.1)
Sodium: 130 mmol/L — ABNORMAL LOW (ref 135–145)

## 2014-12-22 LAB — PROTIME-INR
INR: 2.17 — AB (ref 0.00–1.49)
Prothrombin Time: 24 seconds — ABNORMAL HIGH (ref 11.6–15.2)

## 2014-12-22 MED ORDER — HYDRALAZINE HCL 25 MG PO TABS
12.5000 mg | ORAL_TABLET | Freq: Three times a day (TID) | ORAL | Status: DC
  Administered 2014-12-22: 12.5 mg via ORAL
  Filled 2014-12-22: qty 1

## 2014-12-22 MED ORDER — BISACODYL 10 MG RE SUPP
10.0000 mg | Freq: Once | RECTAL | Status: AC
  Administered 2014-12-22: 10 mg via RECTAL
  Filled 2014-12-22: qty 1

## 2014-12-22 MED ORDER — SORBITOL 70 % SOLN
30.0000 mL | Freq: Once | Status: AC
  Administered 2014-12-22: 30 mL via ORAL
  Filled 2014-12-22: qty 30

## 2014-12-22 MED ORDER — FUROSEMIDE 10 MG/ML IJ SOLN
80.0000 mg | Freq: Once | INTRAMUSCULAR | Status: AC
  Administered 2014-12-22: 80 mg via INTRAVENOUS
  Filled 2014-12-22: qty 8

## 2014-12-22 MED ORDER — ISOSORBIDE MONONITRATE ER 30 MG PO TB24
15.0000 mg | ORAL_TABLET | Freq: Every day | ORAL | Status: DC
  Administered 2014-12-22 (×2): 15 mg via ORAL
  Filled 2014-12-22: qty 1

## 2014-12-22 NOTE — Clinical Social Work Placement (Signed)
   CLINICAL SOCIAL WORK PLACEMENT  NOTE  Date:  12/22/2014  Patient Details  Name: [name] Dorce MRN: [id number] Date of Birth: 1940/10/23  Clinical Social Work is seeking post-discharge placement for this patient at the Skilled  Nursing Facility level of care (*CSW will initial, date and re-position this form in  chart as items are completed):  Yes   Patient/family provided with [facility] Clinical Social Work Department's list of facilities offering this level of care within the geographic area requested by the patient (or if unable, by the patient's family).  Yes   Patient/family informed of their freedom to choose among providers that offer the needed level of care, that participate in Medicare, Medicaid or managed care program needed by the patient, have an available bed and are willing to accept the patient.  Yes   Patient/family informed of [facility]'s ownership interest [facility] and [facility], as well as of the fact that they are under no obligation to receive care at these facilities.  PASRR submitted to EDS on       PASRR number received on       Existing PASRR number confirmed on 12/22/14     FL2 transmitted to all facilities in geographic area requested by pt/family on 12/22/14     FL2 transmitted to all facilities within larger geographic area on       Patient informed that his/her managed care company has contracts with or will negotiate with certain facilities, including the following:            Patient/family informed of bed offers received.  Patient chooses bed at       Physician recommends and patient chooses bed at      Patient to be transferred to   on  .  Patient to be transferred to facility by       Patient family notified on   of transfer.  Name of family member notified:        PHYSICIAN Please prepare priority discharge summary, including medications, Please sign FL2, Please prepare prescriptions, Please sign DNR      Additional CommentSharol Harness:    Kaspian Muccio, LCSWA [phone]

## 2014-12-22 NOTE — Progress Notes (Signed)
Physical Therapy Treatment Patient Details Name: Clinton HeadsCharles W Pospisil MRN: [id number] DOB: 09-09-1940 Today's Date: 12/22/2014    History of Present Illness Pt arrived from home with son [facility] with c/o syncopal episode. Pt son stated to EMS that pt was walking to throw trash away and collapsed to floor unresponsive. Pt son started CPR on pt, first EMS arrived and stated that pt had a pulse with agonal respirations. Second EMS arrived and pt has been alert and talking ever since their arrival. Pt has abrasion to right side of forehead along with some swelling and brusing, skin tear to bilateral upper arms bleeding controlled. Pt is on Coumadin and took medication this morning. Pt c/o some chest discomfort possibly d/t chest compressions. Pt recently d/c from hospital after 2 week stay. PMH: CAD, s/p CABG,  carotid stenosis, HTN, ICM, systolic HF, aortic stenosis, prior CVA, CKD, PAD    PT Comments    Pt making steady progress but still requiring assist for mobility and fatigues quickly. Feel pt needs ST-SNF at this time.  Follow Up Recommendations  SNF     Equipment Recommendations  None recommended by PT    Recommendations for Other Services       Precautions / Restrictions Precautions Precautions: Fall Precaution Comments: has had gout in bilateral LE's Restrictions Weight Bearing Restrictions: No    Mobility  Bed Mobility                  Transfers Overall transfer level: Needs assistance Equipment used: Rolling walker (2 wheeled) Transfers: Sit to/from Stand Sit to Stand: Min guard         General transfer comment: Assis for balance  Ambulation/Gait Ambulation/Gait assistance: Min Environmental consultantassist Ambulation Distance (Feet): 250 Feet Assistive device: 4-wheeled walker Gait Pattern/deviations: Step-through pattern;Decreased step length - right;Decreased step length - left;Trunk flexed Gait velocity: decreased Gait velocity interpretation: Below normal speed for  age/gender General Gait Details: Assist for balance. Pt amb on RA with SaO2 >90% throughout   Stairs            Wheelchair Mobility    Modified Rankin (Stroke Patients Only)       Balance Overall balance assessment: Needs assistance Sitting-balance support: No upper extremity supported;Feet supported Sitting balance-Leahy Scale: Good     Standing balance support: No upper extremity supported Standing balance-Leahy Scale: Fair                      Cognition Arousal/Alertness: Awake/alert Behavior During Therapy: WFL for tasks assessed/performed Overall Cognitive Status: Within Functional Limits for tasks assessed                      Exercises      General Comments        Pertinent Vitals/Pain Pain Assessment: 0-10 Pain Score: 4  Faces Pain Scale: Hurts little more Pain Location: ribs Pain Descriptors / Indicators: Sore Pain Intervention(s): Limited activity within patient's tolerance;Premedicated before session;Repositioned    Home Living                      Prior Function            PT Goals (current goals can now be found in the care plan section) Acute Rehab PT Goals Patient Stated Goal: "I don't really know" PT Goal Formulation: With patient Time For Goal Achievement: Aug 01, 2014 Potential to Achieve Goals: Good Progress towards PT goals: Progressing toward goals  Frequency  Min 3X/week    PT Plan Discharge plan needs to be updated    Co-evaluation             End of Session Equipment Utilized During Treatment: Gait belt Activity Tolerance: Patient tolerated treatment well Patient left: in chair;with call bell/phone within reach;with family/visitor present     Time: 8119-1478 PT Time Calculation (min) (ACUTE ONLY): 13 min  Charges:  $Gait Training: 8-22 mins                    G Codes:      Braylynn Ghan 2014/12/25, 10:47 AM  Skip Mayer PT [phone]

## 2014-12-22 NOTE — Progress Notes (Signed)
CSW (Clinical Child psychotherapist) aware of consult and palliative discussion with family. CSW will need PT to reassess for change in needs before submitting for insurance authorization.  Wretha Laris, LCSWA [phone]

## 2014-12-22 NOTE — Care Management Note (Signed)
Case Management Note  Patient Details  Name: Clinton [name] MRN: [id number] Date of Birth: January 21, 1941  Subjective/Objective:      Plan will be SNF once medically stable for d/c.               Action/Plan: CSW assisting with disposition needs.  No further needs from CM at this time.    Expected Discharge Date:                  Expected Discharge Plan:  Home w Home Health Services  In-House Referral:  Clinical Social Work  Discharge planning Services  CM Consult  Post Acute Care Choice:  Home Health, Resumption of Svcs/PTA Provider Choice offered to:     DME Arranged:    DME Agency:     HH Arranged:    HH Agency:     Status of Service:  Completed, signed off  Medicare Important Message Given:  Yes Date Medicare IM Given:  12/19/14 Medicare IM give by:  Tomi Bamberger, RN,BSN  Date Additional Medicare IM Given:    Additional Medicare Important Message give by:     If discussed at Long Length of Stay Meetings, dates discussed: 12-23-14   Additional Comments: Plan for SNF once medically stable.   Gala Lewandowsky, RN 12/22/2014, 3:23 PM

## 2014-12-22 NOTE — Progress Notes (Signed)
Advanced Heart Failure Rounding Note   Subjective:    Clinton [name] is a 74 y.o. male with a history of CABG, PAD s/p Ao-bifem bypass and Ao-R renal bypass, carotid stenosis s/p bilateral CEA, LICA and L CCA stenting, systolic HF EF 30%, severe aortic stenosis, and CKD (baseline Cr ~2.5). He is on coumadin for a history of CVA. (No mention of PAF in chart)  He was discharged 06/21 after a 13 day hospital stay for cardiogenic shock in etting of severe low gradient AS. Treted with inotropes and IV lasix. Felt not to be candidate for TAVR due to renal failure and severe COPD.  Was made DNR. Admitted with syncope/cardiac arrest with CPR by his son.   Complaining of pain in chest from CPR. Weight up another 4 pounds.    Objective:   Weight Range:  Vital Signs:   Temp:  [97.4 F (36.3 C)-97.5 F (36.4 C)] 97.4 F (36.3 C) (06/27 0400) Pulse Rate:  [69-78] 76 (06/27 0757) Resp:  [15-18] 17 (06/27 0400) BP: (109-142)/(86-101) 142/101 mmHg (06/27 0757) SpO2:  [93 %-98 %] 93 % (06/27 0757) Weight:  [161 lb (73.029 kg)] 161 lb (73.029 kg) (06/27 0400) Last BM Date: 12/19/14  Weight change: Filed Weights   12/20/14 0535 12/21/14 0500 12/22/14 0400  Weight: 156 lb 14.4 oz (71.169 kg) 157 lb (71.215 kg) 161 lb (73.029 kg)    Intake/Output:   Intake/Output Summary (Last 24 hours) at 12/22/14 1026 Last data filed at 12/22/14 1021  Gross per 24 hour  Intake   1062 ml  Output    650 ml  Net    412 ml     Physical Exam: General: Elderly. Ambulating with PT. NAD Son present.  HEENT: ecchymosis on R eye and forehead Neck: supple. JVP to jaw cm. Carotids 2+ bilat; no bruits. No lymphadenopathy or thryomegaly appreciated. Cor: PMI nondisplaced. Regular rate & rhythm. 2/6 AS with moderately depressed S2, 2/6 MR Lungs: Bibasilar crackles/diminished  Abdomen: nontender. soft. Midline hernia present, easily reducible, non tender. No hepatosplenomegaly. No bruits or masses. Good bowel  sounds. Extremities: no cyanosis, clubbing, rash. 1+ woody edema Neuro: alert & orientedx3, cranial nerves grossly intact. moves all 4 extremities w/o difficulty. Affect sad Telemetry: SR with LBBB  Labs: Basic Metabolic Panel:  Recent Labs Lab 12/16/14 0523 12/18/14 1344 12/19/14 0653 12/22/14 0303  NA 133* 130* 131* 130*  K 4.4 3.7 3.9 4.9  CL 88* 90* 90* 93*  CO2 30  --  28 25  GLUCOSE 104* 131* 101* 143*  BUN 98* 115* 110* 101*  CREATININE 2.66* 2.90* 3.02* 2.43*  CALCIUM 9.0  --  8.6* 9.0    Liver Function Tests:  Recent Labs Lab 12/19/14 0653  AST 21  ALT 19  ALKPHOS 165*  BILITOT 1.1  PROT 6.8  ALBUMIN 3.1*   No results for input(s): LIPASE, AMYLASE in the last 168 hours. No results for input(s): AMMONIA in the last 168 hours.  CBC:  Recent Labs Lab 12/16/14 0523 12/18/14 1319 12/18/14 1344  WBC 10.6* 9.4  --   NEUTROABS  --  7.6  --   HGB 11.4* 10.6* 12.2*  HCT 36.7* 33.1* 36.0*  MCV 79.1 77.5*  --   PLT 193 167  --     Cardiac Enzymes:  Recent Labs Lab 12/18/14 2032 12/19/14 0115 12/19/14 0653  TROPONINI 0.98* 0.89* 0.83*    BNP: BNP (last 3 results)  Recent Labs  12/03/14 1909  BNP >4500.0*  ProBNP (last 3 results)  Recent Labs  05/10/14 0935  PROBNP 63888.0*      Other results:  Imaging: No results found.   Medications:     Scheduled Medications: . antiseptic oral rinse  7 mL Mouth Rinse BID  . calcium carbonate  1 tablet Oral Once  . feeding supplement (ENSURE COMPLETE)  237 mL Oral Daily  . levothyroxine  75 mcg Oral QAC breakfast  . predniSONE  40 mg Oral Q breakfast  . sodium chloride  3 mL Intravenous Q12H  . Umeclidinium Bromide  1 puff Inhalation Daily    Infusions:    PRN Medications: sodium chloride, acetaminophen, ALPRAZolam, diphenhydrAMINE, docusate sodium, fentaNYL (SUBLIMAZE) injection, nitroGLYCERIN, ondansetron (ZOFRAN) IV, oxyCODONE-acetaminophen, sodium chloride,  zolpidem   Assessment:   1. Syncope/cardiac arrest 2. Severe low gradient AS 3. Chronic systolic HF     - Ischemic cardiomyopathy. EF 30-35%. He has declined ICD in the past.  4. Acute on CKD stage 4, GFR 15-29: monitor closely with diuresis. (baseline cr = 2.5) 5. CAD, hx of CABG: crestor  6. PAD 7. COPD with chronic respiratory failure 8. DNR   Plan/Discussion:    Palliative Care input appreciated.   Renal function down a little. Volume status up. Weight not accurate. Give a dose of IV lasix. Hold off on hydralazine and imdur. No ace with CKD.   He has been on coumadin for h/o CVA (no h/o PAF) so will stop.  Disposition- SNF-->. [name] Length of Stay: 4  CLEGG,AMY NP-C  12/22/2014, 10:26 AM  Advanced Heart Failure Team Pager [phone] (M-F; 7a - 4p)  Please contact [facility] for night-coverage after hours (4p -7a ) and weekends on [email or web address]  Patient seen and examined with Tonye Becket, NP. We discussed all aspects of the encounter. I agree with the assessment and plan as stated above.   Remains tenuous. Volume status up but need to be gentle with diuresis given severe AS. Willgive IV lasix. Also needs sorbitol for constipation. Palliative care input much appreciated. Possibly to SNF in 24-48 hours.   Darren Nodal,MD 12:46 AM

## 2014-12-22 NOTE — Clinical Social Work Note (Signed)
Clinical Social Work Assessment  Patient Details  Name: Clinton [name] MRN: [id number] Date of Birth: Dec 30, 1940  Date of referral:  12/22/14               Reason for consult:  Facility Placement                Permission sought to share information with:  Facility Industrial/product designer granted to share information::  Yes, Verbal Permission Granted  Name::        Agency::  Area SNFs for referral purposes  Relationship::     Contact Information:     Housing/Transportation Living arrangements for the past 2 months:  Single Family Home Source of Information:  Patient, Adult Children Patient Interpreter Needed:  None Criminal Activity/Legal Involvement Pertinent to Current Situation/Hospitalization:  No - Comment as needed Significant Relationships:  Adult Children Lives with:  Adult Children Do you feel safe going back to the place where you live?  No Need for family participation in patient care:  No (Coment) (Pt requesting son be present)  Care giving concerns: Pt and pt family have decided on SNF after speaking with palliative team   Social Worker assessment / plan:  CSW visited pt room and spoke with pt and pt son. They confirmed they would like for dc to plan to be SNF. CSW explained SNF referral process and insurance authorization need. Pt and pt son understanding. Pt has been to [facility] in the past but is unsure this will be an option this admission. Pt and pt son expressed no facility preferences and agreeable to referral being sent to all [facility].   Employment status:  Retired Database administrator PT Recommendations:  Skilled Nursing Facility Information / Referral to community resources:  Skilled Nursing Facility  Patient/Family's Response to care: Pt and pt son calm during assessment and in agreement with plan.  Patient/Family's Understanding of and Emotional Response to Diagnosis, Current Treatment, and Prognosis:  Pt  and pt son both with limited emotion during conversation but still appropriate. Seem to have a good understanding based off of questions asked by pt son.  Emotional Assessment Appearance:  Well-Groomed Attitude/Demeanor/Rapport:  Other (Cooperative) Affect (typically observed):  Accepting, Flat Orientation:  Oriented to Self, Oriented to Place, Oriented to  Time, Oriented to Situation Alcohol / Substance use:  Not Applicable Psych involvement (Current and /or in the community):  No (Comment)  Discharge Needs  Concerns to be addressed:  Discharge Planning Concerns Readmission within the last 30 days:  Yes Current discharge risk:  Dependent with Mobility, Chronically ill Barriers to Discharge:  Insurance Authorization   [name], Clinton [name] [phone]

## 2014-12-22 NOTE — Evaluation (Signed)
Occupational Therapy Evaluation Patient Details Name: Clinton [name] MRN: [id number] DOB: 25-Jul-1940 Today's Date: 12/22/2014    History of Present Illness Pt arrived from home with son by [facility] with c/o syncopal episode. Pt son stated to EMS that pt was walking to throw trash away and collapsed to floor unresponsive. Pt son started CPR on pt, first EMS arrived and stated that pt had a pulse with agonal respirations. Second EMS arrived and pt has been alert and talking ever since their arrival. Pt has abrasion to right side of forehead along with some swelling and brusing, skin tear to bilateral upper arms bleeding controlled. Pt is on Coumadin and took medication this morning. Pt c/o some chest discomfort possibly due to chest compressions. Pt recently d/c from hospital after 2 week stay. PMH: CAD, s/p CABG,  carotid stenosis, HTN, ICM, systolic HF, aortic stenosis, prior CVA, CKD, PAD   Clinical Impression   Pt admitted with above. Pt requiring assist at times for ADLs, PTA. Feel pt will benefit from acute OT to increase independence prior to d/c. Discharge plan is SNF.    Follow Up Recommendations  SNF    Equipment Recommendations  Other (comment) (defer to next venue)    Recommendations for Other Services       Precautions / Restrictions Precautions Precautions: Fall Precaution Comments: has had gout in bilateral LE's Restrictions Weight Bearing Restrictions: No      Mobility Bed Mobility Overal bed mobility: Modified Independent (sit to supine; HOB elevated)                Transfers Overall transfer level: Needs assistance Transfers: Sit to/from Stand Sit to Stand: Supervision            Balance Pt unsteady one time at sink (supervision-min guard when at sink during functional activities).       Min guard for ambulation with RW.                    ADL Overall ADL's : Needs assistance/impaired Eating/Feeding: Independent;Sitting   Grooming:  Wash/dry face;Applying deodorant;Min guard;Standing;Set up;Supervision/safety        Upper Body Bathing: Min guard; standing         Lower Body Dressing: Min guard;Sit to/from stand   Toilet Transfer: Min guard;Ambulation;RW (chair/bed)           Functional mobility during ADLs: Min guard;Rolling walker General ADL Comments: Educated on energy conservation. Son reports pt has difficulty with socks, so OT educated on sockaid,however pt able to don/doff socks in session with no physical assist given. Pt also washed off hair with washcloth while standing at sink.     Vision   Pt wears glasses.  Perception     Praxis      Pertinent Vitals/Pain Pain Assessment: 0-10 Pain Score: 7  Pain Location: ribs and neck and left arm/back area Pain Descriptors / Indicators: Sore;Aching Pain Intervention(s): Monitored during session  Monitor reading brady HR in session, however in short period of time HR would return to stable. Notified nurse. O2 reading 86% in session one time (unsure of accuracy), but went up and was 97% at end of session according to monitor.     Hand Dominance Right   Extremity/Trunk Assessment Upper Extremity Assessment Upper Extremity Assessment: Overall WFL for tasks assessed;Generalized weakness   Lower Extremity Assessment Lower Extremity Assessment: Defer to PT evaluation       Communication Communication Communication: No difficulties   Cognition Arousal/Alertness: Awake/alert Behavior  During Therapy: WFL for tasks assessed/performed Overall Cognitive Status: Within Functional Limits for tasks assessed                     General Comments       Exercises       Shoulder Instructions      Home Living Family/patient expects to be discharged to:: Private residence Living Arrangements: Children (son) Available Help at Discharge: Family;Available PRN/intermittently (son works nights 3x/week) Type of Home: House Home Access: Stairs to  [employer];Level entry Secretary/administratorntrance Stairs-Number of Steps: 3 Entrance Stairs-Rails: None Home Layout: One level               Home Equipment: Walker - 2 wheels;Cane - single point          Prior Functioning/Environment Level of Independence: Needs assistance    ADL's / Homemaking Assistance Needed: assist at times for ADLs   Comments: been using RW last week since hospitalization    OT Diagnosis: Generalized weakness;Acute pain   OT Problem List: Decreased strength;Decreased activity tolerance;Decreased knowledge of use of DME or AE;Decreased knowledge of precautions;Impaired balance (sitting and/or standing);Pain;Cardiopulmonary status limiting activity   OT Treatment/Interventions: Self-care/ADL training;DME and/or AE instruction;Therapeutic activities;Patient/family education;Balance training;Therapeutic exercise;Energy conservation    OT Goals(Current goals can be found in the care plan section) Acute Rehab OT Goals Patient Stated Goal: not stated OT Goal Formulation: With patient Time For Goal Achievement: 12/29/14 Potential to Achieve Goals: Good ADL Goals Pt Will Perform Grooming: standing;with supervision (including gathering items) Pt Will Perform Upper Body Bathing: with supervision;sitting;standing (including gathering items) Pt Will Perform Lower Body Bathing: with supervision;sit to/from stand (including gathering items) Pt Will Perform Lower Body Dressing: with supervision;sit to/from stand (including gathering items) Pt Will Transfer to Toilet: with supervision;ambulating Pt Will Perform Toileting - Clothing Manipulation and hygiene: sit to/from stand;with supervision  OT Frequency: Min 2X/week   Barriers to D/C:            Co-evaluation              End of Session Equipment Utilized During Treatment: Gait belt;Rolling walker Nurse Communication: Other (comment) (HR and O2 sats)  Activity Tolerance: Patient tolerated treatment well Patient left: in  bed;with call bell/phone within reach;with family/visitor present   Time: [id number] OT Time Calculation (min): 13 min Charges:  OT General Charges $OT Visit: 1 Procedure OT Evaluation $Initial OT Evaluation Tier I: 1 Procedure G-CodesEarlie Raveling:    Benjamen Koelling L OTR/L [phone] 12/22/2014, 11:34 AM

## 2014-12-22 NOTE — Clinical Social Work Placement (Signed)
   CLINICAL SOCIAL WORK PLACEMENT  NOTE  Date:  12/22/2014  Patient Details  Name: [name] Feldt MRN: [id number] Date of Birth: February 22, 1941  Clinical Social Work is seeking post-discharge placement for this patient at the Skilled  Nursing Facility level of care (*CSW will initial, date and re-position this form in  chart as items are completed):  Yes   Patient/family provided with [place] Clinical Social Work Department's list of facilities offering this level of care within the geographic area requested by the patient (or if unable, by the patient's family).  Yes   Patient/family informed of their freedom to choose among providers that offer the needed level of care, that participate in Medicare, Medicaid or managed care program needed by the patient, have an available bed and are willing to accept the patient.  Yes   Patient/family informed of [facility]'s ownership interest in [facility] and [facility], as well as of the fact that they are under no obligation to receive care at these facilities.  PASRR submitted to EDS on       PASRR number received on       Existing PASRR number confirmed on 12/22/14     FL2 transmitted to all facilities in geographic area requested by pt/family on 12/22/14     FL2 transmitted to all facilities within larger geographic area on       Patient informed that his/her managed care company has contracts with or will negotiate with certain facilities, including the following:        Yes   Patient/family informed of bed offers received.  [facility]     Physician recommends and patient chooses bed at      Patient to be transferred to   on  .  Patient to be transferred to facility by       Patient family notified on   of transfer.  Name of family member notified:        PHYSICIAN Please prepare priority discharge summary, including medications, Please sign FL2, Please prepare prescriptions, Please sign  DNR     Additional Comment:    _______________________________________________ Sharol HarnessPoonum Dakoda [place], Theresia MajorsLCSWA [phone]

## 2014-12-22 NOTE — Progress Notes (Signed)
CSW Proofreader(Clinical Social Worker) spoke with pt and pt son and provided bed offers. [facility] would [facility]. CSW notified facility and they confirmed they would start insurance authorization.  Clinton [name], LCSWA [phone]

## 2014-12-23 LAB — BASIC METABOLIC PANEL
Anion gap: 11 (ref 5–15)
BUN: 103 mg/dL — AB (ref 6–20)
CO2: 28 mmol/L (ref 22–32)
CREATININE: 2.59 mg/dL — AB (ref 0.61–1.24)
Calcium: 8.7 mg/dL — ABNORMAL LOW (ref 8.9–10.3)
Chloride: 90 mmol/L — ABNORMAL LOW (ref 101–111)
GFR calc Af Amer: 26 mL/min — ABNORMAL LOW (ref >60)
GFR, EST NON AFRICAN AMERICAN: 23 mL/min — AB (ref >60)
GLUCOSE: 119 mg/dL — AB (ref 65–99)
Potassium: 5.3 mmol/L — ABNORMAL HIGH (ref 3.5–5.1)
Sodium: 129 mmol/L — ABNORMAL LOW (ref 135–145)

## 2014-12-23 LAB — PROTIME-INR
INR: 1.9 — ABNORMAL HIGH (ref 0.00–1.49)
Prothrombin Time: 21.7 seconds — ABNORMAL HIGH (ref 11.6–15.2)

## 2014-12-23 MED ORDER — ROSUVASTATIN CALCIUM 10 MG PO TABS
40.0000 mg | ORAL_TABLET | Freq: Every day | ORAL | Status: DC
  Administered 2014-12-23: 40 mg via ORAL
  Filled 2014-12-23: qty 4

## 2014-12-23 MED ORDER — HEPARIN SODIUM (PORCINE) 5000 UNIT/ML IJ SOLN
5000.0000 [IU] | Freq: Three times a day (TID) | INTRAMUSCULAR | Status: DC
  Administered 2014-12-23: 5000 [IU] via SUBCUTANEOUS
  Filled 2014-12-23: qty 1

## 2014-12-23 MED ORDER — DOCUSATE SODIUM 100 MG PO CAPS: 100.0000 mg | ORAL_CAPSULE | Freq: Two times a day (BID) | ORAL | Status: AC | PRN

## 2014-12-23 MED ORDER — PREDNISONE 20 MG PO TABS: 40.0000 mg | ORAL_TABLET | Freq: Every day | ORAL | Status: DC

## 2014-12-23 MED ORDER — ASPIRIN 81 MG PO TBEC: 81.0000 mg | DELAYED_RELEASE_TABLET | Freq: Every day | ORAL | Status: DC

## 2014-12-23 MED ORDER — OXYCODONE-ACETAMINOPHEN 5-325 MG PO TABS: 1.0000 | ORAL_TABLET | ORAL | Status: DC | PRN

## 2014-12-23 MED ORDER — CLOPIDOGREL BISULFATE 75 MG PO TABS: 75.0000 mg | ORAL_TABLET | Freq: Every day | ORAL | Status: AC

## 2014-12-23 MED ORDER — TORSEMIDE 20 MG PO TABS: 40.0000 mg | ORAL_TABLET | Freq: Every day | ORAL | Status: AC

## 2014-12-23 MED ORDER — ALPRAZOLAM 0.25 MG PO TABS: 0.2500 mg | ORAL_TABLET | Freq: Two times a day (BID) | ORAL | Status: DC | PRN

## 2014-12-23 MED ORDER — ASPIRIN EC 81 MG PO TBEC
81.0000 mg | DELAYED_RELEASE_TABLET | Freq: Every day | ORAL | Status: DC
  Administered 2014-12-23: 81 mg via ORAL
  Filled 2014-12-23: qty 1

## 2014-12-23 NOTE — Progress Notes (Signed)
Attempted to call report to nurse at [facility]. No nurse was able to come to the phone. Pt discharged traveling to facility with family. Pt awake, alert and oriented upon discharge.

## 2014-12-23 NOTE — Discharge Summary (Signed)
Advanced Heart Failure Team  Discharge Summary   Patient ID: Clinton [name] MRN: [id number], DOB/AGE: 03/05/1941 74 y.o. Admit date: 12/18/2014 D/C date:     12/23/2014   Primary Discharge Diagnoses:  1. Syncope/cardiac arrest- 12/18/2014  2. Severe low gradient AS 3. Chronic systolic HF   - Ischemic cardiomyopathy. EF 30-35%. He has declined ICD in the past.  4. Acute on CKD stage 4, GFR 15-29: monitor closely with diuresis. (baseline cr = 2.5) 5. CAD, hx of CABG: crestor 40mg   6. PAD 7. COPD with chronic respiratory failure 8. DNR/DNI   Hospital Course:  Clinton [name] is a 74 year old with a history of CABG, PAD s/p Ao-bifem bypass and Ao-R renal bypass, carotid stenosis s/p bilateral CEA, LICA and L CCA stenting, HTN, HL, ICM, systolic HF, aortic stenosis, hypothyroidism, prior CVA (had been on coumadin), and CKD. Most recent discharge was June 21st after being treated for cardiogenic shock that required short term inotropes.    Admitted after being found down at home. CPR was performed by his son for a few minutes at his home. EMS arrived and he had pulse and was conscious.  He was placed on oxygen but required no medications. He was transported to the hospital and in the hospital, his ECG is unchanged, he is conscious and alert and complains only of chest wall soreness from the CPR plus leg pain. Chest XRay showed cardiomyopathy and increased diffuse edema c/w CHF. He sustained a bruise on his head and head CT showed prior remote infarcts but no hemorrhage or new infarct, no skull fracture was noted. Renal function on admit with creatinine 2.9 so diuretics held despite elevated volume status.   He was monitored closely and as his renal function improved he was started on reduced torsemide dose, 40 mg daily. Diuretic regimen will be monitored closely as an outpatient. He is preload dependent with severe AS therefore hydralazine/imdur not started but can consider as an outpatient. He  will not be on an ace or spiro with CKD. No b-blocker due to recent low output HF.    He does have history of a stroke and was on coumadin prior to admit but no PAF. Coumadin was stopped and he was placed on 81 mg aspirin, statin, and plavix.   He has had ongoing pain from CPR and will continue on prn pain meds.    Palliative Care consulted for goals of care and at the conclusion of family meeting he elected DNR. Considering Hospice once he finishes skilled care.   PT evaluated and recommended short term SNF. He will be discharged to [facility] today and DNR out of facility form has been completed. He will continue to be followed in the HF clinic and we will check BMET at post hospital visit.    Discharge Weight Range: 160 pounds Discharge Vitals: Blood pressure 121/76, pulse 74, temperature 98.3 F (36.8 C), temperature source Oral, resp. rate 22, height 5\' 11"  (1.803 m), weight 160 lb 14.4 oz (72.984 kg), SpO2 96 %.  Labs: Lab Results  Component Value Date   WBC 9.4 12/18/2014   HGB 12.2* 12/18/2014   HCT 36.0* 12/18/2014   MCV 77.5* 12/18/2014   PLT 167 12/18/2014    Recent Labs Lab 12/19/14 0653  12/23/14 0530  NA 131*  < > 129*  K 3.9  < > 5.3*  CL 90*  < > 90*  CO2 28  < > 28  BUN 110*  < >  103*  CREATININE 3.02*  < > 2.59*  CALCIUM 8.6*  < > 8.7*  PROT 6.8  --   --   BILITOT 1.1  --   --   ALKPHOS 165*  --   --   ALT 19  --   --   AST 21  --   --   GLUCOSE 101*  < > 119*  < > = values in this interval not displayed. Lab Results  Component Value Date   CHOL 243* 10/31/2013   HDL 40.90 10/31/2013   LDLCALC 182* 10/31/2013   TRIG 102.0 10/31/2013   BNP (last 3 results)  Recent Labs  12/03/14 1909  BNP >4500.0*    ProBNP (last 3 results)  Recent Labs  05/10/14 0935  PROBNP 63888.0*     Diagnostic Studies/Procedures   No results found.  Discharge Medications     Medication List    STOP taking these medications        carvedilol  3.125 MG tablet  Commonly known as:  COREG     feeding supplement (ENSURE COMPLETE) Liqd     hydrALAZINE 25 MG tablet  Commonly known as:  APRESOLINE     isosorbide mononitrate 30 MG 24 hr tablet  Commonly known as:  IMDUR     traMADol 50 MG tablet  Commonly known as:  ULTRAM     warfarin 6 MG tablet  Commonly known as:  COUMADIN      TAKE these medications        acetaminophen 500 MG tablet  Commonly known as:  TYLENOL  Take 1,000 mg by mouth every 8 (eight) hours as needed (pain).     ALPRAZolam 0.25 MG tablet  Commonly known as:  XANAX  Take 1 tablet (0.25 mg total) by mouth 2 (two) times daily as needed for anxiety.     aspirin 81 MG EC tablet  Take 1 tablet (81 mg total) by mouth daily.     clopidogrel 75 MG tablet  Commonly known as:  PLAVIX  Take 1 tablet (75 mg total) by mouth daily.     docusate sodium 100 MG capsule  Commonly known as:  COLACE  Take 1 capsule (100 mg total) by mouth 2 (two) times daily as needed for mild constipation.     INCRUSE ELLIPTA 62.5 MCG/INH Aepb  Generic drug:  Umeclidinium Bromide  Inhale 1 puff into the lungs daily.     levothyroxine 75 MCG tablet  Commonly known as:  SYNTHROID, LEVOTHROID  Take 1 tablet (75 mcg total) by mouth daily before breakfast.     oxyCODONE-acetaminophen 5-325 MG per tablet  Commonly known as:  PERCOCET/ROXICET  Take 1-2 tablets by mouth every 4 (four) hours as needed for severe pain.     rosuvastatin 40 MG tablet  Commonly known as:  CRESTOR  Take 1 tablet (40 mg total) by mouth daily.     torsemide 20 MG tablet  Commonly known as:  DEMADEX  Take 2 tablets (40 mg total) by mouth daily.        Disposition   The patient will be discharged in stable condition to home. Discharge Instructions    Contraindication to ACEI at discharge    Complete by:  As directed      Diet - low sodium heart healthy    Complete by:  As directed      Heart Failure patients record your daily weight using the  same scale at the same time of day  Complete by:  As directed      Heart failure skilled nursing facility orders    Complete by:  As directed   Heart Failure Follow-up Care:   Verify follow-up appointments per Patient Discharge Instructions. Arrange transportation.   Reconcile home medications with discharge medication list.    Assessments:    Vital signs and oxygen saturation daily and may decrease frequency as patient condition stabilizes.   If being transitioned from SNF facility to home, assess home environment for safety concerns, caregiver support and availability of low-sodium foods.   Consult Estate manager/land agent based on assessments.   Assess for increased dyspnea, orthopnea, chest discomfort or chest pain, presence of rales/crackles, perripheral edema, abdominal distention, and/or weight gain of 3 pounds in one day or 5 pounds in one week.   Daily Weights and Symptom Monitoring:   Weigh daily before breakfast and after voiding using same scale and record weights on HF Zone Tool/Weight Chart to track weights with symptoms. Standing scale preferred.   Call Physician/AHF Clinic for weight gain of 3 pounds or more in 24 hours or 5 pounds in one week, OR worsening symptoms as noted above.   Activity:    Develop individualized activity plan with patient/caregiver.   Patient Education:   Teach patient/caregiver to weigh daily and record on weight chart.   Reinforce the 5 Key Messages in the "Living with Heart Failure" book.   Reinforce use of HF Zone Tool to support early symptom recognition and appropriate action.   Reinforce low salt diet. Reinforce fluid restriction as ordered.  Heart Failure Follow-up Care:  Advanced Heart Failure (AHF) Clinic at [phone]  Obtain the following labs:   Basic Metabolic Panel Other see comments    Lab frequency:  Other see comments  Fax lab results to:  AHF Clinic at [phone]  Diet:  No Added Salt  Fluid  restrictions:  2000 mL Fluid  Daily Weights and Symptom Monitoring:  Fax weight chart weekly X 2 to AHF Clinic at [phone]  Activity:  As tolerated  PT/OT Consult:  Yes     Increase activity slowly    Complete by:  As directed           Follow-up Information    Follow up with Arvilla Meres, MD. Go on 01/12/2015.   Specialty:  Cardiology   Why:  at 3:00pm in the Advanced Heart Failure clinic--gate code 8000- please bring all medications to appt.    Contact information:   [address] [place] [address] [phone]         Duration of Discharge Encounter: Greater than 35 minutes   Signed, CLEGG,AMY NP_C 12/23/2014, 1:21 PM  Patient seen and examined with Tonye Becket, NP. We discussed all aspects of the encounter. I agree with the assessment and plan as stated above.   He is ready for d/c to SNF. Need to watch volume status closely. F/u in HF Clinic as scheduled.  Shabria Egley,MD 1:26 PM

## 2014-12-23 NOTE — Progress Notes (Signed)
Advanced Heart Failure Rounding Note   Subjective:    Clinton [name] is a 74 y.o. male with a history of CABG, PAD s/p Ao-bifem bypass and Ao-R renal bypass, carotid stenosis s/p bilateral CEA, LICA and L CCA stenting, systolic HF EF 30%, severe aortic stenosis, and CKD (baseline Cr ~2.5). He is on coumadin for a history of CVA. (No mention of PAF in chart)  He was discharged 06/21 after a 13 day hospital stay for cardiogenic shock in etting of severe low gradient AS. Treted with inotropes and IV lasix. Felt not to be candidate for TAVR due to renal failure and severe COPD.  Was made DNR. Admitted with syncope/cardiac arrest with CPR by his son.   Yesterday he was given a dose of IV lasix. Weight down 1 pound. Able to ambulate around clinic with PT.   Complaining of rib pain. Denies SOB.    Objective:   Weight Range:  Vital Signs:   Temp:  [97.7 F (36.5 C)-98.3 F (36.8 C)] 98.3 F (36.8 C) (06/28 0500) Pulse Rate:  [58-74] 74 (06/28 0500) Resp:  [22-25] 22 (06/28 0500) BP: (110-147)/(63-90) 121/76 mmHg (06/28 0500) SpO2:  [90 %-96 %] 96 % (06/28 0500) Weight:  [160 lb 14.4 oz (72.984 kg)] 160 lb 14.4 oz (72.984 kg) (06/28 0500) Last BM Date: 12/19/14  Weight change: Filed Weights   12/21/14 0500 12/22/14 0400 12/23/14 0500  Weight: 157 lb (71.215 kg) 161 lb (73.029 kg) 160 lb 14.4 oz (72.984 kg)    Intake/Output:   Intake/Output Summary (Last 24 hours) at 12/23/14 0945 Last data filed at 12/23/14 0830  Gross per 24 hour  Intake   1302 ml  Output    950 ml  Net    352 ml     Physical Exam: General: Elderly. In chair.  NAD  HEENT: ecchymosis on R eye and forehead Neck: supple. JVP to  10. Carotids 2+ bilat; no bruits. No lymphadenopathy or thryomegaly appreciated. Cor: PMI nondisplaced. Regular rate & rhythm. 2/6 AS with moderately depressed S2, 2/6 MR Lungs: Bibasilar crackles/diminished  Abdomen: nontender. soft. Midline hernia present, easily reducible, non  tender. No hepatosplenomegaly. No bruits or masses. Good bowel sounds. Extremities: no cyanosis, clubbing, rash. 1+ woody edema Neuro: alert & orientedx3, cranial nerves grossly intact. moves all 4 extremities w/o difficulty. Affect sad Telemetry: SR with LBBB  Labs: Basic Metabolic Panel:  Recent Labs Lab 12/18/14 1344 12/19/14 0653 12/22/14 0303 12/23/14 0530  NA 130* 131* 130* 129*  K 3.7 3.9 4.9 5.3*  CL 90* 90* 93* 90*  CO2  --  28 25 28   GLUCOSE 131* 101* 143* 119*  BUN 115* 110* 101* 103*  CREATININE 2.90* 3.02* 2.43* 2.59*  CALCIUM  --  8.6* 9.0 8.7*    Liver Function Tests:  Recent Labs Lab 12/19/14 0653  AST 21  ALT 19  ALKPHOS 165*  BILITOT 1.1  PROT 6.8  ALBUMIN 3.1*   No results for input(s): LIPASE, AMYLASE in the last 168 hours. No results for input(s): AMMONIA in the last 168 hours.  CBC:  Recent Labs Lab 12/18/14 1319 12/18/14 1344  WBC 9.4  --   NEUTROABS 7.6  --   HGB 10.6* 12.2*  HCT 33.1* 36.0*  MCV 77.5*  --   PLT 167  --     Cardiac Enzymes:  Recent Labs Lab 12/18/14 2032 12/19/14 0115 12/19/14 0653  TROPONINI 0.98* 0.89* 0.83*    BNP: BNP (last 3 results)  Recent  Labs  12/03/14 1909  BNP >4500.0*    ProBNP (last 3 results)  Recent Labs  05/10/14 0935  PROBNP 63888.0*      Other results:  Imaging: No results found.   Medications:     Scheduled Medications: . antiseptic oral rinse  7 mL Mouth Rinse BID  . calcium carbonate  1 tablet Oral Once  . feeding supplement (ENSURE COMPLETE)  237 mL Oral Daily  . levothyroxine  75 mcg Oral QAC breakfast  . predniSONE  40 mg Oral Q breakfast  . sodium chloride  3 mL Intravenous Q12H  . Umeclidinium Bromide  1 puff Inhalation Daily    Infusions:    PRN Medications: sodium chloride, acetaminophen, ALPRAZolam, diphenhydrAMINE, docusate sodium, fentaNYL (SUBLIMAZE) injection, nitroGLYCERIN, ondansetron (ZOFRAN) IV, oxyCODONE-acetaminophen, sodium chloride,  zolpidem   Assessment:   1. Syncope/cardiac arrest 2. Severe low gradient AS 3. Chronic systolic HF     - Ischemic cardiomyopathy. EF 30-35%. He has declined ICD in the past.  4. Acute on CKD stage 4, GFR 15-29: monitor closely with diuresis. (baseline cr = 2.5) 5. CAD, hx of CABG: crestor  6. PAD 7. COPD with chronic respiratory failure 8. DNR   Plan/Discussion:    Palliative Care input appreciated.   Renal function up a little. Weight down 1 pound after 80 mg IV lasix. Change to torsemide 40 mg daily. Prior to admit was on torsemide 40 mg twice a day but had syncope.  No ace with CKD.   He has been on coumadin for h/o CVA (no h/o PAF) so will stop. Restart statin and 81 mg aspirin.   Disposition- SNF  Today -->. [name] Length of Stay: 5  CLEGG,AMY NP-C  12/23/2014, 9:45 AM  Advanced Heart Failure Team Pager [phone] (M-F; 7a - 4p)  Please contact [facility] for night-coverage after hours (4p -7a ) and weekends on [email or web address]  Patient seen and examined with Tonye Becket, NP. We discussed all aspects of the encounter. I agree with the assessment and plan as stated above.   Overall stable. Agree with d/c today. Biggest challenge will be to find appropriate diuretic dose. Would start with torsemide 40 daily and see how he does. Keep off coumadin for now. Can use Plavix  daily with ECASA 81 mg daily for stroke prevention. No ACE/ARB due to CKD. No b-blocker with low output. Given syncope would hold off on hydral/nitrates for now. We can add in clinic if needed.   Reveca Desmarais,MD 10:29 AM

## 2014-12-23 NOTE — Care Management (Signed)
Important Message  Patient Details  Name: Clinton [name] MRN: [id number] Date of Birth: Jan 05, 1941   Medicare Important Message Given:  Yes-second notification given    Yvonna Alanisndra M Robinson 12/23/2014, 3:20 PM

## 2014-12-23 NOTE — Progress Notes (Signed)
Pt. SATURATION QUALIFICATIONS: (This note is used to comply with regulatory documentation for home oxygen)  Patient Saturations on Room Air at Rest = 86%  Patient Saturations on Room Air while Ambulating = N/A  Patient Saturations on 2 Liters of oxygen while Ambulating = 96%  Please briefly explain why patient needs home oxygen: Pt. Oxygen saturations drop when on room air.

## 2014-12-23 NOTE — Progress Notes (Addendum)
Facility received auth at 4:50pm  Patient will discharge to [facility] Anticipated discharge date:12/23/14 Family notified:at bedside Transportation by family  CSW signing off.     4:30pm CSW met with family to inform that [facility] has still not authorized pt to go to SNF- informed family that authorization was unlikely.  Pt family wants to speak with Dr but doctor is not available to meet with family- Dr states that he is not comfortable sending patient home due after patient had cardiac arrest after [place] home last time.  CSW contacted supervisor who will provide a 5 day LOG for pt to go to a facility.  CSW began search for LOG facility -[facility] will not accept [facility]- left message [facility] is evaluating patient [facility] refuses to take LOG for [facility] Medicare patient [facility] says no [place] [facility]- admissions director has already left for the day  CSW will continue to follow.  Domenica Reamer, [place] Social Worker [phone]

## 2014-12-24 ENCOUNTER — Ambulatory Visit: Payer: Medicare HMO | Admitting: Pulmonary Disease

## 2014-12-24 ENCOUNTER — Encounter: Payer: Self-pay | Admitting: Adult Health

## 2014-12-24 ENCOUNTER — Non-Acute Institutional Stay (SKILLED_NURSING_FACILITY): Payer: Medicare HMO | Admitting: Adult Health

## 2014-12-24 DIAGNOSIS — R55 Syncope and collapse: Secondary | ICD-10-CM | POA: Diagnosis not present

## 2014-12-24 DIAGNOSIS — E785 Hyperlipidemia, unspecified: Secondary | ICD-10-CM | POA: Diagnosis not present

## 2014-12-24 DIAGNOSIS — N184 Chronic kidney disease, stage 4 (severe): Secondary | ICD-10-CM

## 2014-12-24 DIAGNOSIS — E039 Hypothyroidism, unspecified: Secondary | ICD-10-CM | POA: Diagnosis not present

## 2014-12-24 DIAGNOSIS — I5022 Chronic systolic (congestive) heart failure: Secondary | ICD-10-CM | POA: Diagnosis not present

## 2014-12-24 DIAGNOSIS — I25700 Atherosclerosis of coronary artery bypass graft(s), unspecified, with unstable angina pectoris: Secondary | ICD-10-CM

## 2014-12-24 DIAGNOSIS — F419 Anxiety disorder, unspecified: Secondary | ICD-10-CM

## 2014-12-24 DIAGNOSIS — J449 Chronic obstructive pulmonary disease, unspecified: Secondary | ICD-10-CM

## 2014-12-24 DIAGNOSIS — K5900 Constipation, unspecified: Secondary | ICD-10-CM

## 2014-12-24 DIAGNOSIS — E871 Hypo-osmolality and hyponatremia: Secondary | ICD-10-CM | POA: Diagnosis not present

## 2014-12-24 DIAGNOSIS — R5381 Other malaise: Secondary | ICD-10-CM

## 2014-12-24 NOTE — Progress Notes (Signed)
Patient ID: Clinton [name], male   DOB: [birth year], 74 y.o.   MRN: [id number]   12/24/2014  Facility:  Nursing Home Location:  [facility] Room Number: 306-2 LEVEL OF CARE:  SNF (31)   Chief Complaint  Patient presents with  . Hospitalization Follow-up    Physical deconditioning, syncope, COPD, CAD, chronic kidney disease stage IV, CHF, hyperlipidemia, hypothyroidism, constipation, anxiety and hyponatremia    HISTORY OF PRESENT ILLNESS:  This is a 74 year old male was been admitted to [facility] on 12/23/14 from [facility]. He has PMH of history of CABG, PAD, S/P Ao-bifem bypass and Ao-Renal bypass, carotid stenosis S/P CEA, LICA and L CCA stenting, hypertension, hyperlipidemia, systolic heart failure, I aortic stenosis, hypothyroidism, prior CVA (had been on Coumadin), and CKD. He was recently discharged on 6/21 after being treated for cardiogenic shock that required short-term inotropes. He was found unconscious at home by his son who has been performed CPR for a few minutes. EMS arrived and he had pulse and was conscious. He was placed on O2 and required no medications. In the hospital, he complains of chest wall soreness from CPR and leg pain. Chest x-ray showed cardiomyopathy and increased diffuse edema complicated with CHF. He sustained a bruised head and head CT showed prior remote infarcts but no hemorrhage or new infarct. Creatinine was 2.9 so direct ex were held despite elevated volume status. His renal function then improved and was started on reduced torsemide at 40 mg daily. He is preload dependent with severe aortic stenosis therefore hydralazine/Imdur not started but can consider as an outpatient. He will not be on ACE or Spironolactone with CKD. No beta blocker due to recent low output HF. Coumadin was stopped and was put on aspirin 81 mg by mouth daily, a statin and Plavix. Positive care was consulted and have elected DO NOT RESUSCITATE. He is  considering hospice once he finishes skilled care.  He has been admitted for a short-term rehabilitation.  PAST MEDICAL HISTORY:  Past Medical History  Diagnosis Date  . CAD (coronary artery disease)     a. CABG 1985;  b. 05/2002 Rotablator/PTCA to ostial LCX and RI;  c. 12/2009 MV: inf and lat infarct w/ minimal mid lateral and basilar ant ischemia, EF 29%->Med Rx.  . Cardiomyopathy, ischemic     a. 06/2010 Cardiac MRI: EF 30%, anterolat, post, inf prior subendocardial infarcts;  b. 08/2012 Echo: EF 30-35%, mild LVH mult wma's, Gr 2 DD;  c. 05/2013 Echo: EF 20-25%, diff HK, mod AS, mild MR, mod dil LA, mild to mod TR, PASP .  . Stroke     remote  . Hypertension   . Hyperlipidemia   . Tobacco abuse   . Anticoagulated on Coumadin   . Chronic systolic CHF (congestive heart failure)     a. 05/2013 EF 20-25%, diff HK.  . Moderate aortic stenosis     a. 05/2013 Echo: at least moderate AS (Valve area 1.07 cm2 - VTI; 1.08cm2 - Vmax).  . Hypothyroidism     a. 05/2013 TSH 50.15 - synthroid 50 mcg daily started.  . Peripheral arterial disease     a. Ao-bifem bypass w/ 16x8 hemashield dacron graft, R aortorenal bypass w/ 6mm dacron graft;  b. 09/2011 ABI: R 0/87, L 0.92.  . Carotid artery occlusion     a. 12/2002 R CEA;  b. 05/2007 L Carotid stenting;  c. 09/2008 repeat L carotid stenting 2/2 ISR;  d. 09/2011  Carotid U/S: RICA 40-59%, LICA 60-79%, >50 dist LCCA stenosis.  . CKD (chronic kidney disease), stage III   . Myocardial infarction 12/2009    MV: inf and lat infarct w/ minimal mid lateral and basilar ant ischemia, EF 29%->Med Rx  . Myocardial infarction 1985    "that's why I had the bypass"  . COPD (chronic obstructive pulmonary disease)   . Pneumonia 05/2014  . Arthritis     "hands" (12/03/2014)  . 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 Codeine Phosphate Nausea And  Vomiting     REVIEW OF SYSTEMS:  GENERAL: no change in appetite, no fatigue, no weight changes, no fever, chills or weakness RESPIRATORY: no cough, SOB, DOE, wheezing, hemoptysis CARDIAC: no chest pain, edema or palpitations GI: no abdominal pain, diarrhea, heart burn, nausea or vomiting, + constipation  PHYSICAL EXAMINATION  GENERAL: no acute distress, normal body habitus EYES: conjunctivae normal, sclerae normal, normal eye lids; bruise below right eye NECK: supple, trachea midline, no neck masses, no thyroid tenderness, no thyromegaly LYMPHATICS: no LAN in the neck, no supraclavicular LAN RESPIRATORY: breathing is even & unlabored, BS CTAB CARDIAC: RRR, no murmur,no extra heart sounds, no edema GI: abdomen soft, normal BS, no masses, no tenderness, no hepatomegaly, no splenomegaly, + abdominal hernia EXTREMITIES:  Able to move 4 extremities PSYCHIATRIC: the patient is alert & oriented to person, affect & behavior appropriate  LABS/RADIOLOGY: Labs reviewed: Basic Metabolic Panel:  Recent Labs  16/10/96 0500  12/06/14 0224  12/08/14 1715  12/19/14 0653 12/22/14 0303 12/23/14 0530  NA 133*  < > 135  < > 132*  < > 131* 130* 129*  K 3.9  < > 4.4  < > 4.7  < > 3.9 4.9 5.3*  CL 95*  < > 99*  < > 89*  < > 90* 93* 90*  CO2 18*  < > 24  < > 28  < > 28 25 28   GLUCOSE 145*  < > 106*  < > 116*  < > 101* 143* 119*  BUN 97*  < > 71*  < > 79*  < > 110* 101* 103*  CREATININE 3.52*  < > 2.49*  < > 2.75*  < > 3.02* 2.43* 2.59*  CALCIUM 7.9*  < > 8.5*  < > 8.6*  < > 8.6* 9.0 8.7*  MG 2.3  --  2.4  --  2.6*  --   --   --   --   < > = values in this interval not displayed. Liver Function Tests:  Recent Labs  05/23/14 0504 12/03/14 1909 12/19/14 0653  AST 20 18 21   ALT 32 14* 19  ALKPHOS 266* 183* 165*  BILITOT 0.8 0.9 1.1  PROT 6.5 6.5 6.8  ALBUMIN 2.6* 3.2* 3.1*   CBC:  Recent Labs  05/13/14 1920  12/03/14 1909  12/10/14 1155 12/16/14 0523 12/18/14 1319  12/18/14 1344  WBC 11.6*  < > 8.2  < > 11.4* 10.6* 9.4  --   NEUTROABS 10.8*  --  6.2  --   --   --  7.6  --   HGB 9.6*  < > 11.7*  < > 10.8* 11.4* 10.6* 12.2*  HCT 27.6*  < > 36.9*  < > 35.0* 36.7* 33.1* 36.0*  MCV 79.8  < > 76.2*  < > 76.6* 79.1 77.5*  --  PLT 159  < > 180  < > 173 193 167  --   < > = values in this interval not displayed.  Cardiac Enzymes:  Recent Labs  12/18/14 2032 12/19/14 0115 12/19/14 0653  TROPONINI 0.98* 0.89* 0.83*     Dg Chest 2 View  12/18/2014   CLINICAL DATA:  Loss of consciousness. Fall at home today. Left neck pain. Chest pain is worse with expiration.  EXAM: CHEST - 2 VIEW  COMPARISON:  Two-view chest x-ray 12/02/2014.  FINDINGS: The heart is enlarged. A diffuse interstitial pattern is slightly more prominent than on the prior study. Airspace disease the right base is improved. A chronic right pleural effusion is again noted. Median sternotomy for CABG is evident.  IMPRESSION: 1. Cardiomegaly with slight increase in diffuse edema compatible with congestive heart failure. 2. Improved aeration at right lung base. 3. Similar appearance chronic right pleural effusion.   Electronically Signed   By: Marin Roberts M.D.   On: 12/18/2014 14:21   Dg Chest 2 View  12/02/2014   CLINICAL DATA:  Pneumonia.  Right pleural effusion.  EXAM: CHEST  2 VIEW  COMPARISON:  CT 07/22/2014 .  FINDINGS: Mediastinum hilar structures normal. Cardiomegaly with pulmonary vascular congestion. Diffuse interstitial prominence and bilateral pleural effusions noted. These findings are consistent congestive heart failure. No pneumothorax.  IMPRESSION: 1. Prior CABG. 2. Congestive heart failure with pulmonary interstitial edema and bilateral pleural effusions.   Electronically Signed   By: Maisie Fus  Register   On: 12/02/2014 09:32   Ct Head Wo Contrast  12/18/2014   CLINICAL DATA:  Syncopal episode today.  Fell and hit head.  EXAM: CT HEAD WITHOUT CONTRAST  CT CERVICAL SPINE WITHOUT  CONTRAST  TECHNIQUE: Multidetector CT imaging of the head and cervical spine was performed following the standard protocol without intravenous contrast. Multiplanar CT image reconstructions of the cervical spine were also generated.  COMPARISON:  None.  FINDINGS: CT HEAD FINDINGS  The ventricles are in the midline without mass effect or shift. They are within normal limits in size and configuration given the degree of cerebral atrophy. There are remote appearing infarcts noted in the right posterior parietal region, right occipital region and right cerebellum. No findings for acute hemispheric infarction an or intracranial hemorrhage. No extra-axial fluid collections.  No acute skull fracture is identified. The paranasal sinuses and mastoid air cells are clear. The globes are intact.  CT CERVICAL SPINE FINDINGS  Degenerative cervical spondylosis with multilevel disc disease and facet disease. No acute cervical spine fracture. The facets are normally aligned. The skullbase C1 and C1-2 articulations are maintained. The dens is intact. No abnormal prevertebral soft tissue swelling. Mild bony foraminal stenosis at C3-4 and left due to uncinate spurring and facet disease. Similar findings at C5-6.  Emphysematous changes are noted at the lung apices along with scarring changes. A left carotid artery stent is noted.  IMPRESSION: 1. Remote appearing right-sided posterior parietal, occipital and cerebellar infarcts. No definite acute hemispheric infarction or intracranial hemorrhage. No mass lesions or extra-axial fluid collections. 2. No acute skull fracture. 3. Degenerative cervical spondylosis with multilevel disc disease and facet disease but no acute cervical spine fracture.   Electronically Signed   By: Rudie Meyer M.D.   On: 12/18/2014 14:43   Ct Cervical Spine Wo Contrast  12/18/2014   CLINICAL DATA:  Syncopal episode today.  Fell and hit head.  EXAM: CT HEAD WITHOUT CONTRAST  CT CERVICAL SPINE WITHOUT CONTRAST   TECHNIQUE:  Multidetector CT imaging of the head and cervical spine was performed following the standard protocol without intravenous contrast. Multiplanar CT image reconstructions of the cervical spine were also generated.  COMPARISON:  None.  FINDINGS: CT HEAD FINDINGS  The ventricles are in the midline without mass effect or shift. They are within normal limits in size and configuration given the degree of cerebral atrophy. There are remote appearing infarcts noted in the right posterior parietal region, right occipital region and right cerebellum. No findings for acute hemispheric infarction an or intracranial hemorrhage. No extra-axial fluid collections.  No acute skull fracture is identified. The paranasal sinuses and mastoid air cells are clear. The globes are intact.  CT CERVICAL SPINE FINDINGS  Degenerative cervical spondylosis with multilevel disc disease and facet disease. No acute cervical spine fracture. The facets are normally aligned. The skullbase C1 and C1-2 articulations are maintained. The dens is intact. No abnormal prevertebral soft tissue swelling. Mild bony foraminal stenosis at C3-4 and left due to uncinate spurring and facet disease. Similar findings at C5-6.  Emphysematous changes are noted at the lung apices along with scarring changes. A left carotid artery stent is noted.  IMPRESSION: 1. Remote appearing right-sided posterior parietal, occipital and cerebellar infarcts. No definite acute hemispheric infarction or intracranial hemorrhage. No mass lesions or extra-axial fluid collections. 2. No acute skull fracture. 3. Degenerative cervical spondylosis with multilevel disc disease and facet disease but no acute cervical spine fracture.   Electronically Signed   By: Rudie MeyerP.  Gallerani M.D.   On: 12/18/2014 14:43   [place] Renal  12/08/2014   CLINICAL DATA:  74 year old male with renal failure. Initial encounter.  EXAM: RENAL / URINARY TRACT ULTRASOUND COMPLETE  COMPARISON:  05/10/2014 CT.   FINDINGS: Right Kidney:  Length: 10.6 cm. Renal parenchymal thinning without hydronephrosis. There are at least 3 renal cysts 2 of which appear simple (measuring up to 3.1 cm) however, 1 of the cysts in the upper pole region appears complex measuring 1.8 x 2.1 x 2 cm.  Left Kidney:  Length: 9.7 cm. Renal parenchymal thinning without hydronephrosis. 1.6 and a 2.4 cm cysts mid to lower pole region.  Bladder:  Partially contracted with a thick wall.  IMPRESSION: Bilateral renal parenchymal thinning without hydronephrosis.  Bilateral renal cysts. Right upper pole 2.1 cm cyst is complex. As patient has renal failure, this cannot be evaluated with contrast-enhanced MR. Recommend follow-up renal sonogram in 3-6 months for further delineation (sooner if patient develops hematuria).  Thickened urinary bladder wall. This may be partially explained by partial contraction and bladder outlet changes however, limited for excluding mucosal lesion.   Electronically Signed   By: Lacy DuverneySteven  Olson M.D.   On: 12/08/2014 15:01   [place] Abdomen Limited  12/06/2014   CLINICAL DATA:  74 year old male presents for therapeutic paracentesis for ascites.  EXAM: LIMITED ABDOMEN ULTRASOUND FOR ASCITES  TECHNIQUE: Limited ultrasound survey for ascites was performed in all four abdominal quadrants.  COMPARISON:  CT scan of the abdomen and pelvis 05/10/2014  FINDINGS: Sonographic interrogation of the 4 quadrants of the abdomen demonstrates trace ascites in the perihepatic space and right lower quadrant. The ascites is inadequate for percutaneous drainage. Therefore, paracentesis was not performed.  IMPRESSION: Trace ascites which is of too small volume for paracentesis.  Signed,  Sterling BigHeath K. McCullough, MD  [facility]  [facility]   Electronically Signed   By: Malachy MoanHeath  McCullough M.D.   On: 12/06/2014 10:13    ASSESSMENT/PLAN:  Physical  deconditioning - for rehabilitation  Syncope/cardiac arrest 6/23 -  he now complains of chest wall soreness; continue Percocet 5/325 mg 1 tab by mouth every 4 hours when necessary for pain  COPD - continue Incruse Ellipta 62.5 micrograms/inh inhale 2 puffs into her lungs daily; wean off O2 as tolerated  CAD with hx CABG - continue aspirin 81 mg by mouth daily and Plavix 75 mg by mouth daily  Chronic kidney disease stage IV - baseline creatinine 2.5; latest creatinine 2.5  Chronic systolic CHF - EF 30-35%; has declined ICD in the past; continue torsemide 20 mg 2 tabs = 40 mg by mouth daily; follow-up with Dr. Gala Romney, cardiology, on 7/18  Hyperlipidemia - continue Crestor 40 mg 1 tab by mouth daily  Hypothyroidism - continue levothyroxine 75 g by mouth daily  Constipation - discontinue Colace; start senna S2 tabs by mouth twice a day and MiraLAX 17 g last 4-6 ounces liquid by mouth 2 times a day  Anxiety - mood is stable; continue Xanax 0.25 mg 1 tab by mouth twice a day when necessary  Hyponatremia - Na 129; will monitor    Goals of care:  Short-term rehabilitation/ long-term care   Labs/test ordered:  BMP, CBC and TSH  Spent 50 minutes in patient care.    [facility], NP [employer] [phone]

## 2014-12-26 ENCOUNTER — Non-Acute Institutional Stay: Payer: Medicare HMO | Admitting: Internal Medicine

## 2014-12-26 DIAGNOSIS — I5043 Acute on chronic combined systolic (congestive) and diastolic (congestive) heart failure: Secondary | ICD-10-CM | POA: Diagnosis not present

## 2014-12-26 DIAGNOSIS — K5900 Constipation, unspecified: Secondary | ICD-10-CM | POA: Diagnosis not present

## 2014-12-26 DIAGNOSIS — N184 Chronic kidney disease, stage 4 (severe): Secondary | ICD-10-CM

## 2014-12-26 DIAGNOSIS — J449 Chronic obstructive pulmonary disease, unspecified: Secondary | ICD-10-CM

## 2014-12-26 DIAGNOSIS — I25700 Atherosclerosis of coronary artery bypass graft(s), unspecified, with unstable angina pectoris: Secondary | ICD-10-CM | POA: Diagnosis not present

## 2014-12-26 DIAGNOSIS — R5381 Other malaise: Secondary | ICD-10-CM | POA: Diagnosis not present

## 2014-12-26 DIAGNOSIS — I10 Essential (primary) hypertension: Secondary | ICD-10-CM | POA: Diagnosis not present

## 2014-12-26 DIAGNOSIS — E039 Hypothyroidism, unspecified: Secondary | ICD-10-CM | POA: Diagnosis not present

## 2014-12-26 DIAGNOSIS — R0789 Other chest pain: Secondary | ICD-10-CM | POA: Diagnosis not present

## 2014-12-26 DIAGNOSIS — I462 Cardiac arrest due to underlying cardiac condition: Secondary | ICD-10-CM | POA: Diagnosis not present

## 2014-12-26 NOTE — Progress Notes (Signed)
Patient ID: Clinton [name], male   DOB: 06/09/1941, 74 y.o.   MRN: [id number]     [facility]   PCP: No PCP Per Patient  Code Status: dnr  Allergies  Allergen Reactions  . Codeine Phosphate Nausea And Vomiting    Chief Complaint  Patient presents with  . New Admit To SNF     HPI:  74 year old patient is here for short term rehabilitation post hospital admission from 12/18/14-12/23/14 with syncope and cardiac arrest. He had CPR performed. He was noted to have pulmonary edema and was started on torsemide. Ct head ruled out acute bleed, infarcts and fracture. His coumadin was discontinued this admission and palliative care team was consulted. He was made DNR. he has PMH of history of CABG, PAD S/P Ao-bifem bypass and Ao-Renal bypass, carotid stenosis S/P CEA, LICA and L CCA stenting, hypertension, hyperlipidemia, systolic heart failure, aortic stenosis, hypothyroidism, prior CVA (had been on Coumadin), and CKD. He is seen in his room. He has generalized weakness. He complaints of chest area soreness.he is on o2.   Review of Systems:  Constitutional: Negative for fever, chills, diaphoresis. has generalized weakness HENT: Negative for headache, congestion, nasal discharge Eyes: Negative for eye pain, blurred vision, double vision and discharge.  Respiratory: Negative for cough, shortness of breath and wheezing.   Cardiovascular: Negative for chest pain, palpitations, leg swelling. Has chest soreness in CPR area  Gastrointestinal: Negative for nausea, vomiting, abdominal pain. Has occasional heartburn. Had bowel movement this am Genitourinary: Negative for dysuria Musculoskeletal: Negative for back pain, falls in facility Skin: Negative for itching, rash.  Neurological: Negative for dizziness, tingling, focal weakness Psychiatric/Behavioral: Negative for depression    Past Medical History  Diagnosis Date  . CAD (coronary artery disease)     a.  CABG 1985;  b. 05/2002 Rotablator/PTCA to ostial LCX and RI;  c. 12/2009 MV: inf and lat infarct w/ minimal mid lateral and basilar ant ischemia, EF 29%->Med Rx.  . Cardiomyopathy, ischemic     a. 06/2010 Cardiac MRI: EF 30%, anterolat, post, inf prior subendocardial infarcts;  b. 08/2012 Echo: EF 30-35%, mild LVH mult wma's, Gr 2 DD;  c. 05/2013 Echo: EF 20-25%, diff HK, mod AS, mild MR, mod dil LA, mild to mod TR, PASP .  . Stroke     remote  . Hypertension   . Hyperlipidemia   . Tobacco abuse   . Anticoagulated on Coumadin   . Chronic systolic CHF (congestive heart failure)     a. 05/2013 EF 20-25%, diff HK.  . Moderate aortic stenosis     a. 05/2013 Echo: at least moderate AS (Valve area 1.07 cm2 - VTI; 1.08cm2 - Vmax).  . Hypothyroidism     a. 05/2013 TSH 50.15 - synthroid 50 mcg daily started.  . Peripheral arterial disease     a. Ao-bifem bypass w/ 16x8 hemashield dacron graft, R aortorenal bypass w/ 6mm dacron graft;  b. 09/2011 ABI: R 0/87, L 0.92.  . Carotid artery occlusion     a. 12/2002 R CEA;  b. 05/2007 L Carotid stenting;  c. 09/2008 repeat L carotid stenting 2/2 ISR;  d. 09/2011 Carotid U/S: RICA 40-59%, LICA 60-79%, >50 dist LCCA stenosis.  . CKD (chronic kidney disease), stage III   . Myocardial infarction 12/2009    MV: inf and lat infarct w/ minimal mid lateral and basilar ant ischemia, EF 29%->Med Rx  . Myocardial infarction 1985    "that's  why I had the bypass"  . COPD (chronic obstructive pulmonary disease)   . Pneumonia 05/2014  . Arthritis     "hands" (12/03/2014)  . 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 Carotid endarterectomy Right 12/2002       . Aorta - bilateral femoral artery bypass graft  2004    Dr. Madilyn FiremanHayes  . Aortic/renal bypass Right 2004    Dr. Madilyn FiremanHayes  . Carotid stent Left 05/2007; 09/2008       . Coronary angioplasty with stent placement  05/2002    Rotablator/PTCA to ostial  LCX and RI  . Inguinal hernia repair Right   . Back surgery    . Lumbar disc surgery  X 3?  Marland Kitchen. Coronary artery bypass graft  1985    "CABG X5"   Social History:   reports that he quit smoking about 4 weeks ago. His smoking use included Cigarettes. He has a 50 pack-year smoking history. He has never used smokeless tobacco. He reports that he does not drink alcohol or use illicit drugs.  Family History  Problem Relation Age of Onset  . CAD    . Stroke Mother   . Heart attack Neg Hx     Medications: Patient's Medications  New Prescriptions   No medications on file  Previous Medications   ACETAMINOPHEN (TYLENOL) 500 MG TABLET    Take 1,000 mg by mouth every 8 (eight) hours as needed (pain).   ALPRAZOLAM (XANAX) 0.25 MG TABLET    Take 1 tablet (0.25 mg total) by mouth 2 (two) times daily as needed for anxiety.   ASPIRIN EC 81 MG EC TABLET    Take 1 tablet (81 mg total) by mouth daily.   CLOPIDOGREL (PLAVIX) 75 MG TABLET    Take 1 tablet (75 mg total) by mouth daily.   DOCUSATE SODIUM (COLACE) 100 MG CAPSULE    Take 1 capsule (100 mg total) by mouth 2 (two) times daily as needed for mild constipation.   LEVOTHYROXINE (SYNTHROID, LEVOTHROID) 75 MCG TABLET    Take 1 tablet (75 mcg total) by mouth daily before breakfast.   OXYCODONE-ACETAMINOPHEN (PERCOCET/ROXICET) 5-325 MG PER TABLET    Take 1-2 tablets by mouth every 4 (four) hours as needed for severe pain.   ROSUVASTATIN (CRESTOR) 40 MG TABLET    Take 1 tablet (40 mg total) by mouth daily.   TORSEMIDE (DEMADEX) 20 MG TABLET    Take 2 tablets (40 mg total) by mouth daily.   UMECLIDINIUM BROMIDE (INCRUSE ELLIPTA) 62.5 MCG/INH AEPB    Inhale 1 puff into the lungs daily.  Modified Medications   No medications on file  Discontinued Medications   No medications on file     Physical Exam: Filed Vitals:   12/26/14 1505  BP: 119/67  Pulse: 74  Temp: 97 F (36.1 C)  Resp: 18  Weight: 163 lb 9.6 oz (74.208 kg)  SpO2: 95%    General-  elderly male, thin built, in no acute distress Head- normocephalic, atraumatic Throat- moist mucus membrane Eyes- PERRLA, EOMI, no pallor, no icterus, no discharge, normal conjunctiva, normal sclera Neck- no cervical lymphadenopathy, no jugular vein distension Cardiovascular- normal s1,s2, no murmurs, trace leg edema, reproducible chest pain Respiratory- bilateral clear to auscultation, no wheeze, no rhonchi, no crackles, no use of accessory muscles, on o2 Abdomen- bowel sounds present, soft, non tender, abdominal wall hernia + Musculoskeletal-  able to move all 4 extremities, generalized weakness, on wheelchair Neurological- no focal deficit Skin- warm and dry, bruise on face and arms, sternum scar and abdominal surgical scar noted Psychiatry- alert and oriented to person and place, normal mood and affect    Labs reviewed: Basic Metabolic Panel:  Recent Labs  16/10/96 0500  12/06/14 0224  12/08/14 1715  12/19/14 0653 12/22/14 0303 12/23/14 0530  NA 133*  < > 135  < > 132*  < > 131* 130* 129*  K 3.9  < > 4.4  < > 4.7  < > 3.9 4.9 5.3*  CL 95*  < > 99*  < > 89*  < > 90* 93* 90*  CO2 18*  < > 24  < > 28  < > 28 25 28   GLUCOSE 145*  < > 106*  < > 116*  < > 101* 143* 119*  BUN 97*  < > 71*  < > 79*  < > 110* 101* 103*  CREATININE 3.52*  < > 2.49*  < > 2.75*  < > 3.02* 2.43* 2.59*  CALCIUM 7.9*  < > 8.5*  < > 8.6*  < > 8.6* 9.0 8.7*  MG 2.3  --  2.4  --  2.6*  --   --   --   --   < > = values in this interval not displayed. Liver Function Tests:  Recent Labs  05/23/14 0504 12/03/14 1909 12/19/14 0653  AST 20 18 21   ALT 32 14* 19  ALKPHOS 266* 183* 165*  BILITOT 0.8 0.9 1.1  PROT 6.5 6.5 6.8  ALBUMIN 2.6* 3.2* 3.1*   No results for input(s): LIPASE, AMYLASE in the last 8760 hours. No results for input(s): AMMONIA in the last 8760 hours. CBC:  Recent Labs  05/13/14 1920  12/03/14 1909  12/10/14 1155 12/16/14 0523 12/18/14 1319 12/18/14 1344  WBC 11.6*  < > 8.2  <  > 11.4* 10.6* 9.4  --   NEUTROABS 10.8*  --  6.2  --   --   --  7.6  --   HGB 9.6*  < > 11.7*  < > 10.8* 11.4* 10.6* 12.2*  HCT 27.6*  < > 36.9*  < > 35.0* 36.7* 33.1* 36.0*  MCV 79.8  < > 76.2*  < > 76.6* 79.1 77.5*  --   PLT 159  < > 180  < > 173 193 167  --   < > = values in this interval not displayed. Cardiac Enzymes:  Recent Labs  12/18/14 2032 12/19/14 0115 12/19/14 0653  TROPONINI 0.98* 0.89* 0.83*    Imaging  Dg Chest 2 View  12/18/2014   CLINICAL DATA:  Loss of consciousness. Fall at home today. Left neck pain. Chest pain is worse with expiration.  EXAM: CHEST - 2 VIEW  COMPARISON:  Two-view chest x-ray 12/02/2014.  FINDINGS: The heart is enlarged. A diffuse interstitial pattern is slightly more prominent than on the prior study. Airspace disease the right base is improved. A chronic right pleural effusion is again noted. Median sternotomy for CABG is evident.  IMPRESSION: 1. Cardiomegaly with slight increase in diffuse edema compatible with congestive heart failure. 2. Improved aeration at right lung base. 3. Similar appearance chronic right pleural effusion.   Electronically Signed   By: Marin Roberts M.D.   On: 12/18/2014 14:21   Dg Chest 2 View  12/02/2014   CLINICAL DATA:  Pneumonia.  Right pleural effusion.  EXAM: CHEST  2  VIEW  COMPARISON:  CT 07/22/2014 .  FINDINGS: Mediastinum hilar structures normal. Cardiomegaly with pulmonary vascular congestion. Diffuse interstitial prominence and bilateral pleural effusions noted. These findings are consistent congestive heart failure. No pneumothorax.  IMPRESSION: 1. Prior CABG. 2. Congestive heart failure with pulmonary interstitial edema and bilateral pleural effusions.   Electronically Signed   By: Maisie Fus  Register   On: 12/02/2014 09:32   Ct Head Wo Contrast  12/18/2014   CLINICAL DATA:  Syncopal episode today.  Fell and hit head.  EXAM: CT HEAD WITHOUT CONTRAST  CT CERVICAL SPINE WITHOUT CONTRAST  TECHNIQUE: Multidetector  CT imaging of the head and cervical spine was performed following the standard protocol without intravenous contrast. Multiplanar CT image reconstructions of the cervical spine were also generated.  COMPARISON:  None.  FINDINGS: CT HEAD FINDINGS  The ventricles are in the midline without mass effect or shift. They are within normal limits in size and configuration given the degree of cerebral atrophy. There are remote appearing infarcts noted in the right posterior parietal region, right occipital region and right cerebellum. No findings for acute hemispheric infarction an or intracranial hemorrhage. No extra-axial fluid collections.  No acute skull fracture is identified. The paranasal sinuses and mastoid air cells are clear. The globes are intact.  CT CERVICAL SPINE FINDINGS  Degenerative cervical spondylosis with multilevel disc disease and facet disease. No acute cervical spine fracture. The facets are normally aligned. The skullbase C1 and C1-2 articulations are maintained. The dens is intact. No abnormal prevertebral soft tissue swelling. Mild bony foraminal stenosis at C3-4 and left due to uncinate spurring and facet disease. Similar findings at C5-6.  Emphysematous changes are noted at the lung apices along with scarring changes. A left carotid artery stent is noted.  IMPRESSION: 1. Remote appearing right-sided posterior parietal, occipital and cerebellar infarcts. No definite acute hemispheric infarction or intracranial hemorrhage. No mass lesions or extra-axial fluid collections. 2. No acute skull fracture. 3. Degenerative cervical spondylosis with multilevel disc disease and facet disease but no acute cervical spine fracture.   Electronically Signed   By: Rudie Meyer M.D.   On: 12/18/2014 14:43   Ct Cervical Spine Wo Contrast  12/18/2014   CLINICAL DATA:  Syncopal episode today.  Fell and hit head.  EXAM: CT HEAD WITHOUT CONTRAST  CT CERVICAL SPINE WITHOUT CONTRAST  TECHNIQUE: Multidetector CT  imaging of the head and cervical spine was performed following the standard protocol without intravenous contrast. Multiplanar CT image reconstructions of the cervical spine were also generated.  COMPARISON:  None.  FINDINGS: CT HEAD FINDINGS  The ventricles are in the midline without mass effect or shift. They are within normal limits in size and configuration given the degree of cerebral atrophy. There are remote appearing infarcts noted in the right posterior parietal region, right occipital region and right cerebellum. No findings for acute hemispheric infarction an or intracranial hemorrhage. No extra-axial fluid collections.  No acute skull fracture is identified. The paranasal sinuses and mastoid air cells are clear. The globes are intact.  CT CERVICAL SPINE FINDINGS  Degenerative cervical spondylosis with multilevel disc disease and facet disease. No acute cervical spine fracture. The facets are normally aligned. The skullbase C1 and C1-2 articulations are maintained. The dens is intact. No abnormal prevertebral soft tissue swelling. Mild bony foraminal stenosis at C3-4 and left due to uncinate spurring and facet disease. Similar findings at C5-6.  Emphysematous changes are noted at the lung apices along with scarring changes. A left  carotid artery stent is noted.  IMPRESSION: 1. Remote appearing right-sided posterior parietal, occipital and cerebellar infarcts. No definite acute hemispheric infarction or intracranial hemorrhage. No mass lesions or extra-axial fluid collections. 2. No acute skull fracture. 3. Degenerative cervical spondylosis with multilevel disc disease and facet disease but no acute cervical spine fracture.   Electronically Signed   By: Rudie Meyer M.D.   On: 12/18/2014 14:43   US Renal  12/08/2014   CLINICAL DATA:  74 year old male with renal failure. Initial encounter.  EXAM: RENAL / URINARY TRACT ULTRASOUND COMPLETE  COMPARISON:  05/10/2014 CT.  FINDINGS: Right Kidney:  Length:  10.6 cm. Renal parenchymal thinning without hydronephrosis. There are at least 3 renal cysts 2 of which appear simple (measuring up to 3.1 cm) however, 1 of the cysts in the upper pole region appears complex measuring 1.8 x 2.1 x 2 cm.  Left Kidney:  Length: 9.7 cm. Renal parenchymal thinning without hydronephrosis. 1.6 and a 2.4 cm cysts mid to lower pole region.  Bladder:  Partially contracted with a thick wall.  IMPRESSION: Bilateral renal parenchymal thinning without hydronephrosis.  Bilateral renal cysts. Right upper pole 2.1 cm cyst is complex. As patient has renal failure, this cannot be evaluated with contrast-enhanced MR. Recommend follow-up renal sonogram in 3-6 months for further delineation (sooner if patient develops hematuria).  Thickened urinary bladder wall. This may be partially explained by partial contraction and bladder outlet changes however, limited for excluding mucosal lesion.   Electronically Signed   By: Lacy Duverney M.D.   On: 12/08/2014 15:01   US Abdomen Limited  12/06/2014   CLINICAL DATA:  74 year old male presents for therapeutic paracentesis for ascites.  EXAM: LIMITED ABDOMEN ULTRASOUND FOR ASCITES  TECHNIQUE: Limited ultrasound survey for ascites was performed in all four abdominal quadrants.  COMPARISON:  CT scan of the abdomen and pelvis 05/10/2014  FINDINGS: Sonographic interrogation of the 4 quadrants of the abdomen demonstrates trace ascites in the perihepatic space and right lower quadrant. The ascites is inadequate for percutaneous drainage. Therefore, paracentesis was not performed.  IMPRESSION: Trace ascites which is of too small volume for paracentesis.  Signed,  Sterling Big, MD  [facility]  [facility]   Electronically Signed   By: Malachy Moan M.D.   On: 12/06/2014 10:13   Assessment/Plan  Physical deconditioning Will have him work with physical therapy and occupational therapy team to help with gait  training and muscle strengthening exercises.fall precautions. Skin care. Encourage to be out of bed. Goal is to return home with hospice services  Syncope Had syncope with cardiac arrest, received CPR. No further syncopal episode. To work with therapy team here to help regain and restore strength  Chest wall tenderness Post CPR. On prn percocet for pain  CHF Ef 30-35%. euvolemic on exam. Continue torsemide 40 mg daily, has pending cardiology appointment  Constipation Stable, continue senna s and miralax for now  CAD s/p CABG Continue aspirin and plavix with crestor and monitor. Remains chest pain free  COPD continue his bronchodilator and o2, wean o2 as tolerated  Chronic kidney disease stage IV latest creatinine 2.5, monitor renal function  Hypothyroidism continue levothyroxine 75 mcg daily  Anemia of chronic disease Hb 12.2. Monitor h&h   Goals of care: short term rehabilitation   Labs/tests ordered: cbc, bmp  Family/ staff Communication: reviewed care plan with patient and nursing supervisor    Oneal Grout, MD  [facility] [phone] (Monday-Friday 8 am -  5 pm) [phone] (afterhours)

## 2014-12-31 ENCOUNTER — Encounter (HOSPITAL_COMMUNITY): Payer: Self-pay | Admitting: Emergency Medicine

## 2014-12-31 ENCOUNTER — Emergency Department (HOSPITAL_COMMUNITY): Payer: Medicare HMO

## 2014-12-31 ENCOUNTER — Observation Stay (HOSPITAL_COMMUNITY)
Admission: EM | Admit: 2014-12-31 | Discharge: 2015-01-01 | Disposition: A | Discharge status: Home / Self Care | Payer: Medicare HMO | Attending: Internal Medicine | Admitting: Internal Medicine

## 2014-12-31 DIAGNOSIS — R011 Cardiac murmur, unspecified: Secondary | ICD-10-CM | POA: Diagnosis not present

## 2014-12-31 DIAGNOSIS — I5022 Chronic systolic (congestive) heart failure: Secondary | ICD-10-CM | POA: Insufficient documentation

## 2014-12-31 DIAGNOSIS — I129 Hypertensive chronic kidney disease with stage 1 through stage 4 chronic kidney disease, or unspecified chronic kidney disease: Secondary | ICD-10-CM | POA: Diagnosis not present

## 2014-12-31 DIAGNOSIS — E039 Hypothyroidism, unspecified: Secondary | ICD-10-CM | POA: Diagnosis not present

## 2014-12-31 DIAGNOSIS — I252 Old myocardial infarction: Secondary | ICD-10-CM | POA: Insufficient documentation

## 2014-12-31 DIAGNOSIS — N183 Chronic kidney disease, stage 3 (moderate): Secondary | ICD-10-CM | POA: Diagnosis not present

## 2014-12-31 DIAGNOSIS — J441 Chronic obstructive pulmonary disease with (acute) exacerbation: Secondary | ICD-10-CM | POA: Diagnosis not present

## 2014-12-31 DIAGNOSIS — I422 Other hypertrophic cardiomyopathy: Secondary | ICD-10-CM | POA: Insufficient documentation

## 2014-12-31 DIAGNOSIS — Z7902 Long term (current) use of antithrombotics/antiplatelets: Secondary | ICD-10-CM | POA: Diagnosis not present

## 2014-12-31 DIAGNOSIS — Z8673 Personal history of transient ischemic attack (TIA), and cerebral infarction without residual deficits: Secondary | ICD-10-CM | POA: Diagnosis not present

## 2014-12-31 DIAGNOSIS — Z951 Presence of aortocoronary bypass graft: Secondary | ICD-10-CM | POA: Insufficient documentation

## 2014-12-31 DIAGNOSIS — I5023 Acute on chronic systolic (congestive) heart failure: Secondary | ICD-10-CM

## 2014-12-31 DIAGNOSIS — M19041 Primary osteoarthritis, right hand: Secondary | ICD-10-CM | POA: Diagnosis not present

## 2014-12-31 DIAGNOSIS — M19042 Primary osteoarthritis, left hand: Secondary | ICD-10-CM | POA: Diagnosis not present

## 2014-12-31 DIAGNOSIS — I2510 Atherosclerotic heart disease of native coronary artery without angina pectoris: Secondary | ICD-10-CM | POA: Insufficient documentation

## 2014-12-31 DIAGNOSIS — N184 Chronic kidney disease, stage 4 (severe): Secondary | ICD-10-CM

## 2014-12-31 DIAGNOSIS — Z79899 Other long term (current) drug therapy: Secondary | ICD-10-CM | POA: Insufficient documentation

## 2014-12-31 DIAGNOSIS — Z7982 Long term (current) use of aspirin: Secondary | ICD-10-CM | POA: Diagnosis not present

## 2014-12-31 DIAGNOSIS — R630 Anorexia: Secondary | ICD-10-CM | POA: Insufficient documentation

## 2014-12-31 DIAGNOSIS — Z9861 Coronary angioplasty status: Secondary | ICD-10-CM | POA: Insufficient documentation

## 2014-12-31 DIAGNOSIS — E785 Hyperlipidemia, unspecified: Secondary | ICD-10-CM | POA: Diagnosis not present

## 2014-12-31 DIAGNOSIS — Z8701 Personal history of pneumonia (recurrent): Secondary | ICD-10-CM | POA: Diagnosis not present

## 2014-12-31 DIAGNOSIS — R0602 Shortness of breath: Secondary | ICD-10-CM | POA: Insufficient documentation

## 2014-12-31 DIAGNOSIS — I509 Heart failure, unspecified: Secondary | ICD-10-CM

## 2014-12-31 DIAGNOSIS — I350 Nonrheumatic aortic (valve) stenosis: Secondary | ICD-10-CM | POA: Diagnosis not present

## 2014-12-31 LAB — URINALYSIS, ROUTINE W REFLEX MICROSCOPIC
Bilirubin Urine: NEGATIVE
Glucose, UA: NEGATIVE mg/dL
Ketones, ur: NEGATIVE mg/dL
Leukocytes, UA: NEGATIVE
NITRITE: NEGATIVE
Protein, ur: 100 mg/dL — AB
Specific Gravity, Urine: 1.012 (ref 1.005–1.030)
UROBILINOGEN UA: 1 mg/dL (ref 0.0–1.0)
pH: 6 (ref 5.0–8.0)

## 2014-12-31 LAB — URINE MICROSCOPIC-ADD ON

## 2014-12-31 LAB — BASIC METABOLIC PANEL
ANION GAP: 20 — AB (ref 5–15)
BUN: 154 mg/dL — ABNORMAL HIGH (ref 6–20)
CHLORIDE: 93 mmol/L — AB (ref 101–111)
CO2: 21 mmol/L — ABNORMAL LOW (ref 22–32)
CREATININE: 3.89 mg/dL — AB (ref 0.61–1.24)
Calcium: 8.4 mg/dL — ABNORMAL LOW (ref 8.9–10.3)
GFR, EST AFRICAN AMERICAN: 16 mL/min — AB (ref >60)
GFR, EST NON AFRICAN AMERICAN: 14 mL/min — AB (ref >60)
Glucose, Bld: 165 mg/dL — ABNORMAL HIGH (ref 65–99)
Potassium: 5.9 mmol/L — ABNORMAL HIGH (ref 3.5–5.1)
Sodium: 134 mmol/L — ABNORMAL LOW (ref 135–145)

## 2014-12-31 LAB — I-STAT TROPONIN, ED: Troponin i, poc: 7.63 ng/mL (ref 0.00–0.08)

## 2014-12-31 LAB — CBC
HCT: 38.8 % — ABNORMAL LOW (ref 39.0–52.0)
Hemoglobin: 12.2 g/dL — ABNORMAL LOW (ref 13.0–17.0)
MCH: 25.1 pg — ABNORMAL LOW (ref 26.0–34.0)
MCHC: 31.4 g/dL (ref 30.0–36.0)
MCV: 79.8 fL (ref 78.0–100.0)
Platelets: 148 10*3/uL — ABNORMAL LOW (ref 150–400)
RBC: 4.86 MIL/uL (ref 4.22–5.81)
RDW: 24.6 % — AB (ref 11.5–15.5)
WBC: 15.3 10*3/uL — AB (ref 4.0–10.5)

## 2014-12-31 MED ORDER — LEVOTHYROXINE SODIUM 75 MCG PO TABS
75.0000 ug | ORAL_TABLET | Freq: Every day | ORAL | Status: DC
  Administered 2015-01-01: 75 ug via ORAL
  Filled 2014-12-31: qty 1

## 2014-12-31 MED ORDER — ASPIRIN EC 81 MG PO TBEC: 81.0000 mg | DELAYED_RELEASE_TABLET | Freq: Every day | ORAL | Status: DC

## 2014-12-31 MED ORDER — DOCUSATE SODIUM 100 MG PO CAPS: 100.0000 mg | ORAL_CAPSULE | Freq: Two times a day (BID) | ORAL | Status: DC | PRN

## 2014-12-31 MED ORDER — ROSUVASTATIN CALCIUM 40 MG PO TABS
40.0000 mg | ORAL_TABLET | Freq: Every day | ORAL | Status: DC
  Administered 2015-01-01: 40 mg via ORAL
  Filled 2014-12-31: qty 4
  Filled 2014-12-31: qty 1

## 2014-12-31 MED ORDER — SODIUM CHLORIDE 0.9 % IV BOLUS (SEPSIS)
500.0000 mL | Freq: Once | INTRAVENOUS | Status: AC
Start: 2014-12-31 — End: 2014-12-31
  Administered 2014-12-31: 500 mL via INTRAVENOUS

## 2014-12-31 MED ORDER — INSULIN ASPART 100 UNIT/ML ~~LOC~~ SOLN
6.0000 [IU] | Freq: Once | SUBCUTANEOUS | Status: AC
  Administered 2014-12-31: 6 [IU] via INTRAVENOUS
  Filled 2014-12-31: qty 1

## 2014-12-31 MED ORDER — PREDNISONE 20 MG PO TABS
40.0000 mg | ORAL_TABLET | Freq: Every day | ORAL | Status: DC
  Administered 2015-01-01: 40 mg via ORAL
  Filled 2014-12-31: qty 2

## 2014-12-31 MED ORDER — UMECLIDINIUM BROMIDE 62.5 MCG/INH IN AEPB: 1.0000 | INHALATION_SPRAY | Freq: Every day | RESPIRATORY_TRACT | Status: DC

## 2014-12-31 MED ORDER — ALPRAZOLAM 0.25 MG PO TABS: 0.2500 mg | ORAL_TABLET | Freq: Two times a day (BID) | ORAL | Status: DC | PRN

## 2014-12-31 MED ORDER — CLOPIDOGREL BISULFATE 75 MG PO TABS
75.0000 mg | ORAL_TABLET | Freq: Every day | ORAL | Status: DC
  Administered 2015-01-01: 75 mg via ORAL
  Filled 2014-12-31: qty 1

## 2014-12-31 MED ORDER — SODIUM BICARBONATE 8.4 % IV SOLN
50.0000 meq | Freq: Once | INTRAVENOUS | Status: AC
  Administered 2014-12-31: 50 meq via INTRAVENOUS
  Filled 2014-12-31: qty 50

## 2014-12-31 MED ORDER — DEXTROSE 50 % IV SOLN
25.0000 g | Freq: Once | INTRAVENOUS | Status: AC
  Administered 2014-12-31: 25 g via INTRAVENOUS
  Filled 2014-12-31: qty 50

## 2014-12-31 MED ORDER — SODIUM CHLORIDE 0.9 % IV BOLUS (SEPSIS)
500.0000 mL | Freq: Once | INTRAVENOUS | Status: AC
  Administered 2014-12-31: 500 mL via INTRAVENOUS

## 2014-12-31 MED ORDER — SODIUM POLYSTYRENE SULFONATE 15 GM/60ML PO SUSP
15.0000 g | Freq: Once | ORAL | Status: AC
  Administered 2014-12-31: 15 g via ORAL
  Filled 2014-12-31: qty 60

## 2014-12-31 MED ORDER — OXYCODONE-ACETAMINOPHEN 5-325 MG PO TABS
1.0000 | ORAL_TABLET | ORAL | Status: DC | PRN
  Filled 2014-12-31: qty 2

## 2014-12-31 MED ORDER — CALCIUM GLUCONATE 10 % IV SOLN
1.0000 g | Freq: Once | INTRAVENOUS | Status: AC
  Administered 2014-12-31: 1 g via INTRAVENOUS
  Filled 2014-12-31: qty 10

## 2014-12-31 MED ORDER — ALPRAZOLAM 0.25 MG PO TABS
0.1250 mg | ORAL_TABLET | Freq: Two times a day (BID) | ORAL | Status: DC | PRN
  Administered 2015-01-01: 0.125 mg via ORAL
  Filled 2014-12-31: qty 1

## 2014-12-31 MED ORDER — ACETAMINOPHEN 500 MG PO TABS: 1000.0000 mg | ORAL_TABLET | Freq: Three times a day (TID) | ORAL | Status: DC | PRN

## 2014-12-31 NOTE — ED Notes (Signed)
Pt used bedside commode and mixed urine and feces.  Unable to obtain sample.  Pt sts he does not feel the urge to urinate at this time.

## 2014-12-31 NOTE — ED Notes (Signed)
MD at bedside. 

## 2014-12-31 NOTE — ED Notes (Signed)
Update Dr. Tenny Crawoss on patient's status and POC.

## 2014-12-31 NOTE — ED Notes (Signed)
MD aware of BP

## 2014-12-31 NOTE — H&P (Addendum)
Primary Physician: Primary Cardiologist:  Crenshaw/ Bensimhon   HPI: Clinton [name] is a 74 y.o. male with a history of CABG, PAD s/p Ao-bifem bypass and Ao-R renal bypass, carotid stenosis s/p bilateral CEA, LICA and L CCA stenting, HTN, HL, ICM, systolic HF, aortic stenosis, hypothyroidism, prior CVA, and CKD. He is on coumadin for a history of CVA.  He was discharged 06/21 after a 13 day hospital stay for CHF, at one point on Lasix and milrinone drips. He also has low-gradient (due to low EF) severe AS, not a TAVR or AVR candidate. During this past admission, he was seen by Palliative Care and made a limited code. He does not wish intubation or to be on a machine, but is agreeable to CPR, shocks if needed, and medications. He was found  Down at home on 6/23  Cardiac arrest.  resusicted with CPR  Admitted.  Meds grrestarted.  Noted nt on hydralazine/imdur due to AS, ACE I or spiro to CKD  Palliative care consulted   Discharged to [facility]    Presents to ER with SOB   Has felt this way since d/c Nauseated.  Eating very little  No CP  Son says this is the most awake he has been in 3 days  Did notice rash on feet this AM       Past Medical History  Diagnosis Date  . CAD (coronary artery disease)     a. CABG 1985;  b. 05/2002 Rotablator/PTCA to ostial LCX and RI;  c. 12/2009 MV: inf and lat infarct w/ minimal mid lateral and basilar ant ischemia, EF 29%->Med Rx.  . Cardiomyopathy, ischemic     a. 06/2010 Cardiac MRI: EF 30%, anterolat, post, inf prior subendocardial infarcts;  b. 08/2012 Echo: EF 30-35%, mild LVH mult wma's, Gr 2 DD;  c. 05/2013 Echo: EF 20-25%, diff HK, mod AS, mild MR, mod dil LA, mild to mod TR, PASP .  . Stroke     remote  . Hypertension   . Hyperlipidemia   . Tobacco abuse   . Anticoagulated on Coumadin   . Chronic systolic CHF (congestive heart failure)     a. 05/2013 EF 20-25%, diff HK.  . Moderate aortic stenosis     a. 05/2013 Echo: at least  moderate AS (Valve area 1.07 cm2 - VTI; 1.08cm2 - Vmax).  . Hypothyroidism     a. 05/2013 TSH 50.15 - synthroid 50 mcg daily started.  . Peripheral arterial disease     a. Ao-bifem bypass w/ 16x8 hemashield dacron graft, R aortorenal bypass w/ 6mm dacron graft;  b. 09/2011 ABI: R 0/87, L 0.92.  . Carotid artery occlusion     a. 12/2002 R CEA;  b. 05/2007 L Carotid stenting;  c. 09/2008 repeat L carotid stenting 2/2 ISR;  d. 09/2011 Carotid U/S: RICA 40-59%, LICA 60-79%, >50 dist LCCA stenosis.  . CKD (chronic kidney disease), stage III   . Myocardial infarction 12/2009    MV: inf and lat infarct w/ minimal mid lateral and basilar ant ischemia, EF 29%->Med Rx  . Myocardial infarction 1985    "that's why I had the bypass"  . COPD (chronic obstructive pulmonary disease)   . Pneumonia 05/2014  . Arthritis     "hands" (12/03/2014)  . Lung anomaly     "I've got spots on my lung" (12/03/2014)     (Not in a hospital admission)   . calcium gluconate  1 g Intravenous Once  .  sodium polystyrene  15 g Oral Once    Infusions: . sodium chloride      Allergies  Allergen Reactions  . Codeine Phosphate Nausea And Vomiting    History   Social History  . Marital Status: Divorced    Spouse Name: N/A  . Number of Children: N/A  . Years of Education: N/A   Occupational History  . Not on file.   Social History Main Topics  . Smoking status: Former Smoker -- 1.00 packs/day for 50 years    Types: Cigarettes    Quit date: 11/26/2014  . Smokeless tobacco: Never Used  . Alcohol Use: No  . Drug Use: No  . Sexual Activity: No   Other Topics Concern  . Not on file   Social History Narrative   [place] in [place].  They eat out often and otherwise eat a lot of prepared/processed/microwave-type meals.  He continues to smoke a few cigarettes/day.    Family History  Problem Relation Age of Onset  . CAD    . Stroke Mother   . Heart attack Neg Hx     REVIEW OF SYSTEMS:  All  systems reviewed  Negative to the above problem except as noted above.    PHYSICAL EXAM: Filed Vitals:   12/31/14 2135  BP: 77/40  Pulse:   Temp:   Resp: 22    No intake or output data in the 24 hours ending 12/31/14 2151  General:  Thin 74 yo in NAD  Sl icteric HEENT: normal Neck: supple. JVP is increase Carotids 2+ bilat; no bruits. Cor:. Regular rate & rhythm. No rubs, gallops GR II/VI systolic murmru at base   Lungs: Rel clear Abdomen: soft,nondistended. RUQ tenderness  No bruits or masses.Nl bowel sounds. Extremities: Feet cool  Toes dusky  Small petechiae on dorsum of feet  1+ edema   Neuro: alert & oriented x 3, cranial nerves grossly intact. moves all 4 extremities w/o difficulty. Affect pleasant.  ECG:  Results for orders placed or performed during the hospital encounter of 12/31/14 (from the past 24 hour(s))  CBC     Status: Abnormal   Collection Time: 12/31/14  7:55 PM  Result Value Ref Range   WBC 15.3 (H) 4.0 - 10.5 K/uL   RBC 4.86 4.22 - 5.81 MIL/uL   Hemoglobin 12.2 (L) 13.0 - 17.0 g/dL   HCT 16.138.8 (L) 09.639.0 - 04.552.0 %   MCV 79.8 78.0 - 100.0 fL   MCH 25.1 (L) 26.0 - 34.0 pg   MCHC 31.4 30.0 - 36.0 g/dL   RDW 40.924.6 (H) 81.111.5 - 91.415.5 %   Platelets 148 (L) 150 - 400 K/uL  Basic metabolic panel     Status: Abnormal   Collection Time: 12/31/14  7:55 PM  Result Value Ref Range   Sodium 134 (L) 135 - 145 mmol/L   Potassium 5.9 (H) 3.5 - 5.1 mmol/L   Chloride 93 (L) 101 - 111 mmol/L   CO2 21 (L) 22 - 32 mmol/L   Glucose, Bld 165 (H) 65 - 99 mg/dL   BUN 782154 (H) 6 - 20 mg/dL   Creatinine, Ser 9.563.89 (H) 0.61 - 1.24 mg/dL   Calcium 8.4 (L) 8.9 - 10.3 mg/dL   GFR calc non Af Amer 14 (L) >60 mL/min   GFR calc Af Amer 16 (L) >60 mL/min   Anion gap 20 (H) 5 - 15  BNP (order ONLY if patient complains of dyspnea/SOB AND you have documented it  for THIS visit)     Status: Abnormal   Collection Time: 12/31/14  7:55 PM  Result Value Ref Range   B Natriuretic Peptide >45000.0  (H) 0.0 - 100.0 pg/mL  I-stat troponin, ED  (not at [facility])     Status: Abnormal   Collection Time: 12/31/14  8:00 PM  Result Value Ref Range   Troponin i, poc 7.63 (HH) 0.00 - 0.08 ng/mL   Comment NOTIFIED PHYSICIAN    Comment 3           Dg Chest Port 1 View  12/31/2014   CLINICAL DATA:  Shortness of breath and weakness today.  EXAM: PORTABLE CHEST - 1 VIEW  COMPARISON:  PA and lateral chest 12/18/2014.  FINDINGS: There is marked cardiomegaly. Interstitial edema seen on the prior examination appears improved. Small right effusion is not notably changed. Stress a small bilateral pleural effusions, greater on the right are not notably changed. No pneumothorax identified.  IMPRESSION: Cardiomegaly without edema.  No change in small bilateral pleural effusions, larger on the right.   Electronically Signed   By: Drusilla Kanner M.D.   On: 12/31/2014 20:12     ASSESSMENT:    Clinton [name] is an unfortunate 74 yo with history of CAD, chronic systolic CHF, PAD, CV dz and aortic stenosis.  Just discharged from hospital 6/28   Has continued to dwindle.  SOB  Tired.   Pt is DNR  Palliative care contacted on last admission    1  Acute on chronic systolic CHF Pt in a low output state On exam pt is hypotensive  Lungs rel clear.  AS murmur  Evid of volume increase  Feet cool, dusky  Some petechiae Labs signif for K5.9, Cr 3.89, WBC 15.4. Plt 148  BNP >45,000,  Trop 7.73    Pt and family want him to be comfortable .   WIll give IV lasix x 1 tonight if BP improves  FOllow renal function, BP Would not start any inotropic support given DNR status    2.  Elevated trop.  In setting of low output state, AS and known CAD  Follow trends  Will check INR.  If less than 2 would start heparin temporarily.  3.  AS  Severe  Not a candidate for intervention.  4.  CKD  Cr has bumped  frin baseline of 2.5  Follow  5  Hx CVA  On plavix.    6.  DNR  Prognosis is poor.  Will contact hospice/palliative care in AM  Need  to define plans/ orders in order to keep pt comfortable/out of hospital.  ? [facility]    Will order Xanax tonight.  Pt requested.

## 2014-12-31 NOTE — ED Notes (Signed)
Per EMS:  Pt from [facility].  Pt c/o nausea all day without v or d and SOB.  When EMS arrived sts patient was slumped over using accessory muscles, tripod position, and somewhat altered.  EMS placed pt on NRB which resolved the episode.  Pt is on 2L [place] at home, but is on 4L at this time, maintaining 100% and alert and oriented in room at this time.

## 2014-12-31 NOTE — ED Notes (Signed)
Spoke to pt placement about pt's status

## 2014-12-31 NOTE — ED Provider Notes (Addendum)
CSN: [id number]     Arrival date & time 12/31/14  1924 History   First MD Initiated Contact with Patient 12/31/14 1928     Chief Complaint  Patient presents with  . Shortness of Breath     (Consider location/radiation/quality/duration/timing/severity/associated sxs/prior Treatment) Patient is a 74 y.o. male presenting with shortness of breath. The history is provided by the patient and a relative.  Shortness of Breath Associated symptoms: no abdominal pain, no chest pain, no cough, no fever, no headaches, no neck pain, no rash, no sore throat and no vomiting   Patient with hx cardiomyopathy ef 30%, cad, chf, presents w generalized weakness and sob ever since being discharged from hospital last week.  No focal or unilateral numbness or weakness. Patient was admitted for 2 weeks 1 month ago for chf, was on lasix and milrinone gtts.  Was subsequently readmitted 6/23 after syncope/?cardiac arrest, at which time he had chest compression for 1-2 minutes. Pt states he hasnt felt well since hospital d/c.  Feels weakness and sob slowly worse. No acute or abrupt change today. Denies chest pain. +poor po intake. Denies headache. No cough or uri c/o. No fever or chills. No abd pain. No nvd. No dysuria.      Past Medical History  Diagnosis Date  . CAD (coronary artery disease)     a. CABG 1985;  b. 05/2002 Rotablator/PTCA to ostial LCX and RI;  c. 12/2009 MV: inf and lat infarct w/ minimal mid lateral and basilar ant ischemia, EF 29%->Med Rx.  . Cardiomyopathy, ischemic     a. 06/2010 Cardiac MRI: EF 30%, anterolat, post, inf prior subendocardial infarcts;  b. 08/2012 Echo: EF 30-35%, mild LVH mult wma's, Gr 2 DD;  c. 05/2013 Echo: EF 20-25%, diff HK, mod AS, mild MR, mod dil LA, mild to mod TR, PASP .  . Stroke     remote  . Hypertension   . Hyperlipidemia   . Tobacco abuse   . Anticoagulated on Coumadin   . Chronic systolic CHF (congestive heart failure)     a. 05/2013 EF 20-25%, diff HK.  .  Moderate aortic stenosis     a. 05/2013 Echo: at least moderate AS (Valve area 1.07 cm2 - VTI; 1.08cm2 - Vmax).  . Hypothyroidism     a. 05/2013 TSH 50.15 - synthroid 50 mcg daily started.  . Peripheral arterial disease     a. Ao-bifem bypass w/ 16x8 hemashield dacron graft, R aortorenal bypass w/ 6mm dacron graft;  b. 09/2011 ABI: R 0/87, L 0.92.  . Carotid artery occlusion     a. 12/2002 R CEA;  b. 05/2007 L Carotid stenting;  c. 09/2008 repeat L carotid stenting 2/2 ISR;  d. 09/2011 Carotid U/S: RICA 40-59%, LICA 60-79%, >50 dist LCCA stenosis.  . CKD (chronic kidney disease), stage III   . Myocardial infarction 12/2009    MV: inf and lat infarct w/ minimal mid lateral and basilar ant ischemia, EF 29%->Med Rx  . Myocardial infarction 1985    "that's why I had the bypass"  . COPD (chronic obstructive pulmonary disease)   . Pneumonia 05/2014  . Arthritis     "hands" (12/03/2014)  . 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 Carotid endarterectomy Right 12/2002       . Aorta - bilateral femoral artery bypass graft  2004    Dr. Madilyn Fireman  . Aortic/renal bypass  Right 2004    Dr. Madilyn FiremanHayes  . Carotid stent Left 05/2007; 09/2008       . Coronary angioplasty with stent placement  05/2002    Rotablator/PTCA to ostial LCX and RI  . Inguinal hernia repair Right   . Back surgery    . Lumbar disc surgery  X 3?  Marland Kitchen. Coronary artery bypass graft  1985    "CABG X5"   Family History  Problem Relation Age of Onset  . CAD    . Stroke Mother   . Heart attack Neg Hx    History  Substance Use Topics  . Smoking status: Former Smoker -- 1.00 packs/day for 50 years    Types: Cigarettes    Quit date: 11/26/2014  . Smokeless tobacco: Never Used  . Alcohol Use: No    Review of Systems  Constitutional: Negative for fever and chills.  HENT: Negative for sore throat.   Eyes: Negative for redness.  Respiratory: Positive for shortness of  breath. Negative for cough.   Cardiovascular: Negative for chest pain.  Gastrointestinal: Negative for vomiting, abdominal pain and diarrhea.  Genitourinary: Negative for dysuria and flank pain.  Musculoskeletal: Negative for back pain and neck pain.  Skin: Negative for rash.  Neurological: Positive for weakness. Negative for numbness and headaches.  Hematological: Does not bruise/bleed easily.  Psychiatric/Behavioral: Negative for confusion.      Allergies  Codeine phosphate  Home Medications   Prior to Admission medications   Medication Sig Start Date End Date Taking? Authorizing Provider  acetaminophen (TYLENOL) 500 MG tablet Take 1,000 mg by mouth every 8 (eight) hours as needed (pain).    Historical Provider, MD  ALPRAZolam Prudy Feeler(XANAX) 0.25 MG tablet Take 1 tablet (0.25 mg total) by mouth 2 (two) times daily as needed for anxiety. 12/23/14   Amy D Filbert Schilderlegg, NP  aspirin EC 81 MG EC tablet Take 1 tablet (81 mg total) by mouth daily. 12/23/14   Amy D Filbert Schilderlegg, NP  clopidogrel (PLAVIX) 75 MG tablet Take 1 tablet (75 mg total) by mouth daily. 12/23/14   Amy D Filbert Schilderlegg, NP  docusate sodium (COLACE) 100 MG capsule Take 1 capsule (100 mg total) by mouth 2 (two) times daily as needed for mild constipation. 12/23/14   Amy D Filbert Schilderlegg, NP  levothyroxine (SYNTHROID, LEVOTHROID) 75 MCG tablet Take 1 tablet (75 mcg total) by mouth daily before breakfast. 07/11/14   Lewayne BuntingBrian S Crenshaw, MD  oxyCODONE-acetaminophen (PERCOCET/ROXICET) 5-325 MG per tablet Take 1-2 tablets by mouth every 4 (four) hours as needed for severe pain. 12/23/14   Amy D Clegg, NP  rosuvastatin (CRESTOR) 40 MG tablet Take 1 tablet (40 mg total) by mouth daily. Patient taking differently: Take 40 mg by mouth at bedtime.  06/19/13   Rosalio MacadamiaLori C Gerhardt, NP  torsemide (DEMADEX) 20 MG tablet Take 2 tablets (40 mg total) by mouth daily. 12/23/14   Amy D Filbert Schilderlegg, NP  Umeclidinium Bromide (INCRUSE ELLIPTA) 62.5 MCG/INH AEPB Inhale 1 puff into the lungs daily.     Historical Provider, MD   BP 137/86 mmHg  Pulse 85  Temp(Src) 97.6 F (36.4 C) (Oral)  Resp 24  SpO2 100% Physical Exam  Constitutional: He is oriented to person, place, and time. He appears well-developed and well-nourished. No distress.  HENT:  Head: Atraumatic.  Mouth/Throat: Oropharynx is clear and moist.  Eyes: Conjunctivae are normal. No scleral icterus.  Neck: Neck supple. No tracheal deviation present.  Cardiovascular: Normal rate, regular rhythm and intact distal pulses.  Exam reveals no gallop and no friction rub.   Murmur heard. Pulmonary/Chest: Effort normal and breath sounds normal. No accessory muscle usage. No respiratory distress.  Abdominal: Soft. Bowel sounds are normal. He exhibits no distension. There is no tenderness.  Genitourinary:  No cva tenderness  Musculoskeletal: Normal range of motion.  Mild bilateral foot and ankle edema. Petechial rash to dorsum bil feet, sparse. Poor flow to feet, w no palp pulse and weakly dopplerable signal.   Neurological: He is alert and oriented to person, place, and time.  Motor intact bil. sens grossly intact.   Skin: Skin is warm and dry. He is not diaphoretic.  Psychiatric: He has a normal mood and affect.  Nursing note and vitals reviewed.   ED Course  Procedures (including critical care time) Labs Review  Results for orders placed or performed during the hospital encounter of 12/31/14  CBC  Result Value Ref Range   WBC 15.3 (H) 4.0 - 10.5 K/uL   RBC 4.86 4.22 - 5.81 MIL/uL   Hemoglobin 12.2 (L) 13.0 - 17.0 g/dL   HCT 16.1 (L) 09.6 - 04.5 %   MCV 79.8 78.0 - 100.0 fL   MCH 25.1 (L) 26.0 - 34.0 pg   MCHC 31.4 30.0 - 36.0 g/dL   RDW 40.9 (H) 81.1 - 91.4 %   Platelets 148 (L) 150 - 400 K/uL  Basic metabolic panel  Result Value Ref Range   Sodium 134 (L) 135 - 145 mmol/L   Potassium 5.9 (H) 3.5 - 5.1 mmol/L   Chloride 93 (L) 101 - 111 mmol/L   CO2 21 (L) 22 - 32 mmol/L   Glucose, Bld 165 (H) 65 - 99 mg/dL    BUN 782 (H) 6 - 20 mg/dL   Creatinine, Ser 9.56 (H) 0.61 - 1.24 mg/dL   Calcium 8.4 (L) 8.9 - 10.3 mg/dL   GFR calc non Af Amer 14 (L) >60 mL/min   GFR calc Af Amer 16 (L) >60 mL/min   Anion gap 20 (H) 5 - 15  I-stat troponin, ED  (not at [facility])  Result Value Ref Range   Troponin i, poc 7.63 (HH) 0.00 - 0.08 ng/mL   Comment NOTIFIED PHYSICIAN    Comment 3             Dg Chest Port 1 View  12/31/2014   CLINICAL DATA:  Shortness of breath and weakness today.  EXAM: PORTABLE CHEST - 1 VIEW  COMPARISON:  PA and lateral chest 12/18/2014.  FINDINGS: There is marked cardiomegaly. Interstitial edema seen on the prior examination appears improved. Small right effusion is not notably changed. Stress a small bilateral pleural effusions, greater on the right are not notably changed. No pneumothorax identified.  IMPRESSION: Cardiomegaly without edema.  No change in small bilateral pleural effusions, larger on the right.   Electronically Signed   By: Drusilla Kanner M.D.   On: 12/31/2014 20:12       EKG Interpretation   Date/Time:  Wednesday December 31 2014 19:38:01 EDT Ventricular Rate:  82 PR Interval:  226 QRS Duration: 202 QT Interval:  480 QTC Calculation: 561 R Axis:   -66 Text Interpretation:  Sinus rhythm Prolonged PR interval Left bundle  branch block No significant change since last tracing Confirmed by Norbert Malkin   MD, Caryn Bee ([id number]) on 12/31/2014 7:44:30 PM      MDM   Iv ns. Continuous pulse ox and monitor. o2 [place].  Labs. Cxr.  Reviewed nursing notes and prior  charts for additional history.   From labs, troponin markedly elevated as compared to prior.  No current chest pain. ecg same as prior.   Also on labs, aki on top of cri, and with high k.   d50 iv, hco3 iv, insulin iv, kayexalate po. Ca glu iv.  aki on labs, recheck bp low, ns boluses.   As recent d/c from cardiology service and markedly elev trop, contacted re admission to them vs medical service.  On review recent  records and discussion w pt, DNR status.  Pt wishes for medical treatment. No cpr.   CRITICAL CARE  RE dyspnea, weakness, acute kidney injury, hyperkalemia, elevated troponin, hypokalemia. Performed by: Suzi Roots Total critical care time: 35 Critical care time was exclusive of separately billable procedures and treating other patients. Critical care was necessary to treat or prevent imminent or life-threatening deterioration. Critical care was time spent personally by me on the following activities: development of treatment plan with patient and/or surrogate as well as nursing, discussions with consultants, evaluation of patient's response to treatment, examination of patient, obtaining history from patient or surrogate, ordering and performing treatments and interventions, ordering and review of laboratory studies, ordering and review of radiographic studies, pulse oximetry and re-evaluation of patient's condition.  Pt appears to have impending cardiac shock/failure.  Discussed care goals/wishes briefly w pt - pt confirms no cpr, no intubation, no shock, requests medical/supportive therapy.   Cardiology admitting pt.      Cathren Laine, MD 12/31/14 2228

## 2015-01-01 ENCOUNTER — Encounter (HOSPITAL_COMMUNITY): Payer: Self-pay | Admitting: Primary Care

## 2015-01-01 DIAGNOSIS — I350 Nonrheumatic aortic (valve) stenosis: Secondary | ICD-10-CM | POA: Diagnosis not present

## 2015-01-01 DIAGNOSIS — R0602 Shortness of breath: Secondary | ICD-10-CM

## 2015-01-01 DIAGNOSIS — Z515 Encounter for palliative care: Secondary | ICD-10-CM

## 2015-01-01 DIAGNOSIS — R531 Weakness: Secondary | ICD-10-CM

## 2015-01-01 DIAGNOSIS — N179 Acute kidney failure, unspecified: Secondary | ICD-10-CM | POA: Diagnosis not present

## 2015-01-01 DIAGNOSIS — I5023 Acute on chronic systolic (congestive) heart failure: Secondary | ICD-10-CM | POA: Diagnosis not present

## 2015-01-01 LAB — CBC WITH DIFFERENTIAL/PLATELET
BASOS ABS: 0 10*3/uL (ref 0.0–0.1)
Basophils Relative: 0 % (ref 0–1)
Eosinophils Absolute: 0 10*3/uL (ref 0.0–0.7)
Eosinophils Relative: 0 % (ref 0–5)
HEMATOCRIT: 39.6 % (ref 39.0–52.0)
HEMOGLOBIN: 12.7 g/dL — AB (ref 13.0–17.0)
LYMPHS PCT: 7 % — AB (ref 12–46)
Lymphs Abs: 1.4 10*3/uL (ref 0.7–4.0)
MCH: 25.9 pg — ABNORMAL LOW (ref 26.0–34.0)
MCHC: 32.1 g/dL (ref 30.0–36.0)
MCV: 80.8 fL (ref 78.0–100.0)
MONO ABS: 0.6 10*3/uL (ref 0.1–1.0)
Monocytes Relative: 3 % (ref 3–12)
Neutro Abs: 17.6 10*3/uL — ABNORMAL HIGH (ref 1.7–7.7)
Neutrophils Relative %: 90 % — ABNORMAL HIGH (ref 43–77)
Platelets: 143 10*3/uL — ABNORMAL LOW (ref 150–400)
RBC: 4.9 MIL/uL (ref 4.22–5.81)
RDW: 24.6 % — ABNORMAL HIGH (ref 11.5–15.5)
WBC: 19.6 10*3/uL — ABNORMAL HIGH (ref 4.0–10.5)

## 2015-01-01 LAB — HEPATIC FUNCTION PANEL
ALBUMIN: 3.3 g/dL — AB (ref 3.5–5.0)
ALT: 46 U/L (ref 17–63)
AST: 87 U/L — AB (ref 15–41)
Alkaline Phosphatase: 280 U/L — ABNORMAL HIGH (ref 38–126)
BILIRUBIN TOTAL: 1.9 mg/dL — AB (ref 0.3–1.2)
Bilirubin, Direct: 1.1 mg/dL — ABNORMAL HIGH (ref 0.1–0.5)
Indirect Bilirubin: 0.8 mg/dL (ref 0.3–0.9)
Total Protein: 6.9 g/dL (ref 6.5–8.1)

## 2015-01-01 LAB — BRAIN NATRIURETIC PEPTIDE: B Natriuretic Peptide: >4500 pg/mL — ABNORMAL HIGH (ref 0.0–100.0)

## 2015-01-01 LAB — CREATININE, SERUM
Creatinine, Ser: 3.78 mg/dL — ABNORMAL HIGH (ref 0.61–1.24)
GFR calc Af Amer: 17 mL/min — ABNORMAL LOW (ref >60)
GFR, EST NON AFRICAN AMERICAN: 14 mL/min — AB (ref >60)

## 2015-01-01 LAB — PROTIME-INR
INR: 1.58 — AB (ref 0.00–1.49)
Prothrombin Time: 18.9 seconds — ABNORMAL HIGH (ref 11.6–15.2)

## 2015-01-01 MED ORDER — ALPRAZOLAM 0.25 MG PO TABS: 0.2500 mg | ORAL_TABLET | Freq: Two times a day (BID) | ORAL | Status: AC | PRN

## 2015-01-01 MED ORDER — ACETAMINOPHEN 325 MG PO TABS: 650.0000 mg | ORAL_TABLET | ORAL | Status: DC | PRN

## 2015-01-01 MED ORDER — OXYCODONE HCL 5 MG/5ML PO SOLN
5.0000 mg | ORAL | Status: DC | PRN
  Administered 2015-01-01: 5 mg via ORAL
  Filled 2015-01-01: qty 5

## 2015-01-01 MED ORDER — HEPARIN SODIUM (PORCINE) 5000 UNIT/ML IJ SOLN: 5000.0000 [IU] | Freq: Three times a day (TID) | INTRAMUSCULAR | Status: DC

## 2015-01-01 MED ORDER — FUROSEMIDE 10 MG/ML IJ SOLN
80.0000 mg | Freq: Two times a day (BID) | INTRAMUSCULAR | Status: DC
  Administered 2015-01-01: 80 mg via INTRAVENOUS
  Filled 2015-01-01: qty 8

## 2015-01-01 MED ORDER — SODIUM CHLORIDE 0.9 % IJ SOLN
3.0000 mL | Freq: Two times a day (BID) | INTRAMUSCULAR | Status: DC
  Administered 2015-01-01 (×2): 3 mL via INTRAVENOUS

## 2015-01-01 MED ORDER — MAGIC MOUTHWASH W/LIDOCAINE
5.0000 mL | Freq: Four times a day (QID) | ORAL | Status: DC
  Filled 2015-01-01 (×3): qty 5

## 2015-01-01 MED ORDER — SODIUM CHLORIDE 0.9 % IJ SOLN: 3.0000 mL | INTRAMUSCULAR | Status: DC | PRN

## 2015-01-01 MED ORDER — TIOTROPIUM BROMIDE MONOHYDRATE 18 MCG IN CAPS
18.0000 ug | ORAL_CAPSULE | Freq: Every day | RESPIRATORY_TRACT | Status: DC
  Administered 2015-01-01: 18 ug via RESPIRATORY_TRACT
  Filled 2015-01-01: qty 5

## 2015-01-01 MED ORDER — ALPRAZOLAM 0.25 MG PO TABS: 0.2500 mg | ORAL_TABLET | Freq: Two times a day (BID) | ORAL | Status: DC

## 2015-01-01 MED ORDER — SODIUM CHLORIDE 0.9 % IV SOLN: 250.0000 mL | INTRAVENOUS | Status: DC | PRN

## 2015-01-01 MED ORDER — ALPRAZOLAM 0.25 MG PO TABS: 0.2500 mg | ORAL_TABLET | Freq: Four times a day (QID) | ORAL | Status: DC | PRN

## 2015-01-01 MED ORDER — ASPIRIN EC 81 MG PO TBEC
81.0000 mg | DELAYED_RELEASE_TABLET | Freq: Every day | ORAL | Status: DC
  Administered 2015-01-01: 81 mg via ORAL
  Filled 2015-01-01: qty 1

## 2015-01-01 MED ORDER — ONDANSETRON HCL 4 MG/2ML IJ SOLN
4.0000 mg | Freq: Four times a day (QID) | INTRAMUSCULAR | Status: DC | PRN
  Administered 2015-01-01: 4 mg via INTRAVENOUS
  Filled 2015-01-01: qty 2

## 2015-01-01 MED ORDER — MAGIC MOUTHWASH W/LIDOCAINE: 5.0000 mL | Freq: Four times a day (QID) | ORAL | Status: AC

## 2015-01-01 MED ORDER — SENNOSIDES-DOCUSATE SODIUM 8.6-50 MG PO TABS: 2.0000 | ORAL_TABLET | Freq: Every day | ORAL | Status: DC

## 2015-01-01 MED ORDER — OXYCODONE HCL 5 MG/5ML PO SOLN: 5.0000 mg | ORAL | Status: AC | PRN

## 2015-01-01 NOTE — Progress Notes (Signed)
CH requested by PT prior to discharge for spiritual and emotional support; both provided as well as support for family.

## 2015-01-01 NOTE — Progress Notes (Signed)
CSW (Clinical Child psychotherapistocial Worker) notified by [facility] liaison that pt is able to be admitted to facility today. CSW spoke with family and notified. Family aware and confirmed they will be doing paperwork at facility around 4pm and requesting 3pm transport for pt to meet them at facility. CSW notified pt nurse and prepared pt dc packet. Non-emergent ambulance arranged. CSW signing off.  Trayquan Kolakowski, LCSWA [phone]

## 2015-01-01 NOTE — ED Notes (Signed)
Spoke to [facility] RN Shanda Bumps(Jessica) on 3W and will accept pt.  Will contact patient placement

## 2015-01-01 NOTE — Progress Notes (Signed)
CSW Proofreader(Clinical Social Worker) notified family would prefer for pt to dc [facility]. CSW called in referral and notified family.  Leonte Horrigan, LCSWA [phone]

## 2015-01-01 NOTE — Discharge Summary (Signed)
Advanced Heart Failure Team  Discharge Summary   Patient ID: CID AGENA MRN: [id number], DOB/AGE: 1940/09/08 74 y.o. 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 CAD s/p CABG 6. Hx of CVA  7. PAD s/p Ao-bifem bypass and Ao-R renal bypass 8. Carotid stenosis s/p bilateral CEA, LICA, and L CCA stenting 9. Hypertension 10.Hyperkalemia 11. DNR - To [facility] Course:   Clinton [name] is a 74 y.o. male with hx of Combined diastolic/sytolic CHF ECHO 12/04/14 EF 30-35% with Gr2 DD, CAD s/p CABG, PAD s/p Ao-bifem bypass and Ao-R renal bypass, carotid stenosis s/p bilateral CEA, LICA and L CCA stenting, Hx of CVA on Plavix, HTN, HL, ICM, systolic HF, aortic stenosis, hypothyroidism, prior CVA, and CKD.   Pt had two admissions in June for worsening CHF, most notable on 12/18/14 where he was found down at home and required CPR for resuscitation.  During that admission it was determined that despite his significant AS, he was not a candidate for a TAVR or any advanced heart procedures due to worsening kidney function and poor prognosis.  He was seen by palliative care on this visit and chose to have a DNR in place. He was discharged to [facility], as home with hospice was not an option 2/2 lack of help at home.  He presented to [facility] on 12/30/13 with progressive dyspnea and nausea. Concern was for low output HF, but has DNR in place so inotropes were not recommended. He was diuresed with IV lasix with poor response. Due to declining condition palliative care was consulted for goals of care. At the conclusion of the palliative care meeting he and his family elected comfort care. All non essential medications will be discontinued.   He will be transferred to [facility] in [place].  Discharge Weight: 154.8 lb Discharge Vitals: Blood pressure 126/89, pulse 82, temperature 97.6 F (36.4 C), temperature source Oral, resp. rate 14, height 5' 11.5" (1.816 m), weight 154 lb 8.7 oz (70.1 kg), SpO2 100 %.  Labs: Lab Results  Component Value Date   WBC 19.6* 01/01/2015   HGB 12.7* 01/01/2015   HCT 39.6 01/01/2015   MCV 80.8 01/01/2015   PLT 143* 01/01/2015     Recent Labs Lab 12/31/14 1955 12/31/14 2321 01/01/15 0259  NA 134*  --   --   K 5.9*  --   --   CL 93*  --   --   CO2 21*  --   --   BUN 154*  --   --   CREATININE 3.89*  --  3.78*  CALCIUM 8.4*  --   --   PROT  --  6.9  --   BILITOT  --  1.9*  --   ALKPHOS  --  280*  --   ALT  --  46  --   AST  --  87*  --   GLUCOSE 165*  --   --    Lab Results  Component Value Date   CHOL 243* 10/31/2013   HDL 40.90 10/31/2013   LDLCALC 182* 10/31/2013   TRIG 102.0 10/31/2013   BNP (last 3 results)  Recent Labs  12/03/14 1909 12/31/14 1955  BNP >4500.0* >45000.0*    ProBNP (last  3 results)  Recent Labs  05/10/14 0935  PROBNP 40981.163888.0*     Diagnostic Studies/Procedures   Dg Chest Port 1 View  12/31/2014   CLINICAL DATA:  Shortness of breath and weakness today.  EXAM: PORTABLE CHEST - 1 VIEW  COMPARISON:  PA and lateral chest 12/18/2014.  FINDINGS: There is marked cardiomegaly. Interstitial edema seen on the prior examination appears improved. Small right effusion is not notably changed. Stress a small bilateral pleural effusions, greater on the right are not notably changed. No pneumothorax identified.  IMPRESSION: Cardiomegaly without edema.  No change in small bilateral pleural effusions, larger on the right.   Electronically Signed   By: Drusilla Kannerhomas  Dalessio M.D.   On: 12/31/2014 20:12    Discharge Medications     Medication List    STOP taking these medications        aspirin 81 MG EC tablet     oxyCODONE-acetaminophen 5-325 MG per tablet  Commonly known as:   PERCOCET/ROXICET     rosuvastatin 40 MG tablet  Commonly known as:  CRESTOR      TAKE these medications        acetaminophen 500 MG tablet  Commonly known as:  TYLENOL  Take 1,000 mg by mouth every 8 (eight) hours as needed (pain).     ALPRAZolam 0.25 MG tablet  Commonly known as:  XANAX  Take 1 tablet (0.25 mg total) by mouth 2 (two) times daily as needed for anxiety.     clopidogrel 75 MG tablet  Commonly known as:  PLAVIX  Take 1 tablet (75 mg total) by mouth daily.     docusate sodium 100 MG capsule  Commonly known as:  COLACE  Take 1 capsule (100 mg total) by mouth 2 (two) times daily as needed for mild constipation.     INCRUSE ELLIPTA 62.5 MCG/INH Aepb  Generic drug:  Umeclidinium Bromide  Inhale 1 puff into the lungs daily.     levothyroxine 75 MCG tablet  Commonly known as:  SYNTHROID, LEVOTHROID  Take 1 tablet (75 mcg total) by mouth daily before breakfast.     magic mouthwash w/lidocaine Soln  Take 5 mLs by mouth 4 (four) times daily.     oxyCODONE 5 MG/5ML solution  Commonly known as:  ROXICODONE  Take 5 mLs (5 mg total) by mouth every 4 (four) hours as needed for moderate pain, severe pain or breakthrough pain.     predniSONE 20 MG tablet  Commonly known as:  DELTASONE  Take 40 mg by mouth daily with breakfast.     torsemide 20 MG tablet  Commonly known as:  DEMADEX  Take 2 tablets (40 mg total) by mouth daily.        Disposition   He will be [facility].    Duration of Discharge Encounter: Greater than 35 minutes   Signed, Graciella FreerMichael Andrew Tillery PA-C 01/01/2015, 1:40 PM  Advanced Heart Failure Team Pager [phone] (M-F; 7a - 4p)  Please contact [facility] for night-coverage after hours (4p -7a ) and weekends on [email or web address]

## 2015-01-01 NOTE — Consult Note (Signed)
Consultation Note Date: 01/01/2015   Patient Name: Clinton [name]  DOB: 1941-03-27  MRN: [id number]  Age / Sex: 74 y.o., male   PCP: No Pcp Per Patient Referring Physician: Pricilla RifflePaula Ross V, MD  Reason for Consultation: Inpatient hospice referral and Psychosocial/spiritual support  Palliative Care Assessment and Plan Summary of Established Goals of Care and Medical Treatment Preferences   Clinical Assessment/Narrative: Clinton [name] looks ill, frail, and elderly sitting up in his geri-chair this morning.  He is pleasant, and makes but does not keep eye contact.  We talk about his work in [employer] and at [employer], and his love of fishing.   As we talk about his health problems his sons, [name] and Loraine LericheMark come into the room.  [name] talks about his conversations with Doctors this morning, and that they have decided to make Clinton [name] comfortable and give him anything he wants to eat.  They wish for Clinton [name] to be made full comfort care.  Liberalize diet, no lab draws, optimize pain and anxiety medications, with goal to transfer to inpatient Hospice [facility] [place] as soon as possible.   [name] and Loraine LericheMark state they are unable to care for him at home.  [name] states that everyone comes in here talking about death and dying, and wee all know that, we prefer to not continue to discuss death and dying.   [name] states that he has heard that no one knows the time, but has been advised to ask loved ones who want to to come see Clinton [name], and that when his time comes to pass that it would likely be quickly.  We discussed normal changes such as eating and drinking less, interacting less, and sleeping more.  The Palliative medicine team has been able to develop a relationship with Clinton [name] and his family and this has been very helpful.  Thank you for early consults.   Contacts/Participants in Discussion: Primary Decision Maker:  Clinton [name] is able to make his own decisions, but leans on his sons to help him.    HCPOA: Sons [name] and Loraine LericheMark are making decisions for their father.   Son [name], and a daughter who lives in [place] are also involved in decision making.  They are in agreement regarding plan of care and disposition.   Code Status/Advance Care Planning:  DNR  Symptom Management:  Pain:   Percocet 5/325 mg 1-2 tabs every 4 hours PRN,   Tylenol 325mg  PO Q 4  hours PRN,  Anxiety: Alprazolam 0.125 mg PO BID, INCREASED to 0.25 mg PO BID, and Q 6 hours PRN.  Oral thrush:  Magic Mouthwash 5ml Po QID Dyspnea:  Continue respiratory meds as tolerated/able, family wishes Clinton [name] to continue use of oxgen.  N/V: Zofram 4mg  IV Q 6 hours PRN    Palliative Prophylaxis: added Senna S 2 tabs QHS,  Colace 100 m BID PRN   Psycho-social/Spiritual:   Support System: Sons [name] and Loraine LericheMark are making decisions for their father. Son [name], and a daughter who lives in [place] are also involved in decision making.  They are in agreement regarding plan of care and disposition to an inpatient hospice facility, preferably [facility].    Desire for further Chaplaincy support: Chaplain services enter family room as we are finishing discussion, and stays to support family.    Prognosis: Unable to determine  Discharge Planning:  Hospice facility       Chief Complaint: SOB/ Nausea History of  Present Illness:   Clinton [name] is a 74 y.o. male with a history of CABG, PAD s/p Ao-bifem bypass and Ao-R renal bypass, carotid stenosis s/p bilateral CEA, LICA and L CCA stenting, HTN, HL, ICM, systolic HF, aortic stenosis, hypothyroidism, prior CVA, and CKD. He is on coumadin for a history of CVA.   He was discharged 06/21 after a 13 day hospital stay for CHF. He also has low-gradient (due to low EF) severe AS, not a TAVR or AVR candidate. During this past admission, he was seen by Palliative Care.  He is now a DNR.  He was found down at home on 6/23and  resusicted with CPR.  He was then discharged to SNF.   He presents to ER with SOB since d/c and nauseated. Eating very little No CP Son says this is the most awake he has been in 3 days.  His lower legs are cold to the touch and hard.     Primary Diagnoses  Present on Admission:  **None**  Palliative Review of Systems: Pain:   States he is having some pain in his legs, with 'needles' at his knees. Anxiety:  Denies at this time, but son states he is having anxiety and not sleeping well at night.   Oral thrush:  Magic Mouthwash 5ml Po QID Dyspnea:   Not at this time, using [place] oxygen.  N/V: States chronic ongoing low level nausea.    I have reviewed the medical record, interviewed the patient and family, and examined the patient. The following aspects are pertinent.  Past Medical History  Diagnosis Date  . CAD (coronary artery disease)     a. CABG 1985;  b. 05/2002 Rotablator/PTCA to ostial LCX and RI;  c. 12/2009 MV: inf and lat infarct w/ minimal mid lateral and basilar ant ischemia, EF 29%->Med Rx.  . Cardiomyopathy, ischemic     a. 06/2010 Cardiac MRI: EF 30%, anterolat, post, inf prior subendocardial infarcts;  b. 08/2012 Echo: EF 30-35%, mild LVH mult wma's, Gr 2 DD;  c. 05/2013 Echo: EF 20-25%, diff HK, mod AS, mild Clinton, mod dil LA, mild to mod TR, PASP .  . Stroke     remote  . Hypertension   . Hyperlipidemia   . Tobacco abuse   . Anticoagulated on Coumadin   . Chronic systolic CHF (congestive heart failure)     a. 05/2013 EF 20-25%, diff HK.  . Moderate aortic stenosis     a. 05/2013 Echo: at least moderate AS (Valve area 1.07 cm2 - VTI; 1.08cm2 - Vmax).  . Hypothyroidism     a. 05/2013 TSH 50.15 - synthroid 50 mcg daily started.  . Peripheral arterial disease     a. Ao-bifem bypass w/ 16x8 hemashield dacron graft, R aortorenal bypass w/ 6mm dacron graft;  b. 09/2011 ABI: R 0/87, L 0.92.  . Carotid artery occlusion     a. 12/2002 R CEA;  b. 05/2007 L Carotid stenting;  c. 09/2008 repeat L carotid stenting 2/2 ISR;  d. 09/2011  Carotid U/S: RICA 40-59%, LICA 60-79%, >50 dist LCCA stenosis.  . CKD (chronic kidney disease), stage III   . Myocardial infarction 12/2009    MV: inf and lat infarct w/ minimal mid lateral and basilar ant ischemia, EF 29%->Med Rx  . Myocardial infarction 1985    "that's why I had the bypass"  . COPD (chronic obstructive pulmonary disease)   . Pneumonia 05/2014  . Arthritis     "hands" (12/03/2014)  .  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 Marital Status: Divorced    Spouse Name: N/A  . Number of Children: N/A  . Years of Education: N/A   Social History Main Topics  . Smoking status: Former Smoker -- 1.00 packs/day for 50 years    Types: Cigarettes    Quit date: 11/26/2014  . Smokeless tobacco: Never Used  . Alcohol Use: No  . Drug Use: No  . Sexual Activity: No   Other Topics Concern  . None   Social History Narrative   Lives in [place] with his son.  They eat out often and otherwise eat a lot of prepared/processed/microwave-type meals.  He continues to smoke a few cigarettes/day.   Family History  Problem Relation Age of Onset  . CAD    . Stroke Mother   . Heart attack Neg Hx    Scheduled Meds: . aspirin EC  81 mg Oral Daily  . clopidogrel  75 mg Oral Daily  . furosemide  80 mg Intravenous BID  . heparin  5,000 Units Subcutaneous 3 times per day  . levothyroxine  75 mcg Oral QAC breakfast  . predniSONE  40 mg Oral Q breakfast  . rosuvastatin  40 mg Oral Daily  . sodium chloride  3 mL Intravenous Q12H  . tiotropium  18 mcg Inhalation Daily   Continuous Infusions:  PRN Meds:.sodium chloride, acetaminophen, ALPRAZolam, docusate sodium, ondansetron (ZOFRAN) IV, oxyCODONE-acetaminophen, sodium chloride Medications Prior to Admission:  Prior to Admission medications   Medication Sig Start Date End Date Taking? Authorizing Provider  acetaminophen (TYLENOL) 500 MG tablet Take 1,000 mg by mouth every 8 (eight) hours as  needed (pain).   Yes Historical Provider, MD  ALPRAZolam (XANAX) 0.25 MG tablet Take 1 tablet (0.25 mg total) by mouth 2 (two) times daily as needed for anxiety. 12/23/14  Yes Amy D Clegg, NP  aspirin EC 81 MG EC tablet Take 1 tablet (81 mg total) by mouth daily. 12/23/14  Yes Amy D Clegg, NP  clopidogrel (PLAVIX) 75 MG tablet Take 1 tablet (75 mg total) by mouth daily. 12/23/14  Yes Amy D Clegg, NP  docusate sodium (COLACE) 100 MG capsule Take 1 capsule (100 mg total) by mouth 2 (two) times daily as needed for mild constipation. 12/23/14  Yes Amy D Clegg, NP  levothyroxine (SYNTHROID, LEVOTHROID) 75 MCG tablet Take 1 tablet (75 mcg total) by mouth daily before breakfast. 07/11/14  Yes Lewayne Bunting, MD  oxyCODONE-acetaminophen (PERCOCET/ROXICET) 5-325 MG per tablet Take 1-2 tablets by mouth every 4 (four) hours as needed for severe pain. 12/23/14  Yes Amy D Clegg, NP  predniSONE (DELTASONE) 20 MG tablet Take 40 mg by mouth daily with breakfast.   Yes Historical Provider, MD  rosuvastatin (CRESTOR) 40 MG tablet Take 1 tablet (40 mg total) by mouth daily. Patient taking differently: Take 40 mg by mouth every evening.  06/19/13  Yes Rosalio Macadamia, NP  torsemide (DEMADEX) 20 MG tablet Take 2 tablets (40 mg total) by mouth daily. 12/23/14  Yes Amy D Clegg, NP  Umeclidinium Bromide (INCRUSE ELLIPTA) 62.5 MCG/INH AEPB Inhale 1 puff into the lungs daily.   Yes Historical Provider, MD   Allergies  Allergen Reactions  . Codeine Phosphate Nausea And Vomiting   CBC:    Component Value Date/Time   WBC 19.6* 01/01/2015 0259   HGB 12.7* 01/01/2015 0259   HCT 39.6 01/01/2015  0259   PLT 143* 01/01/2015 0259   MCV 80.8 01/01/2015 0259   NEUTROABS 17.6* 01/01/2015 0259   LYMPHSABS 1.4 01/01/2015 0259   MONOABS 0.6 01/01/2015 0259   EOSABS 0.0 01/01/2015 0259   BASOSABS 0.0 01/01/2015 0259   Comprehensive Metabolic Panel:    Component Value Date/Time   NA 134* 12/31/2014 1955   K 5.9* 12/31/2014 1955     CL 93* 12/31/2014 1955   CO2 21* 12/31/2014 1955   BUN 154* 12/31/2014 1955   CREATININE 3.78* 01/01/2015 0259   CREATININE 1.49* 07/17/2012 1257   GLUCOSE 165* 12/31/2014 1955   CALCIUM 8.4* 12/31/2014 1955   AST 87* 12/31/2014 2321   ALT 46 12/31/2014 2321   ALKPHOS 280* 12/31/2014 2321   BILITOT 1.9* 12/31/2014 2321   PROT 6.9 12/31/2014 2321   ALBUMIN 3.3* 12/31/2014 2321    Physical Exam: Vital Signs: BP 126/89 mmHg  Pulse 93  Temp(Src) 97.6 F (36.4 C) (Oral)  Resp 11  Ht 5' 11.5" (1.816 m)  Wt 70.1 kg (154 lb 8.7 oz)  BMI 21.26 kg/m2  SpO2 92% SpO2: SpO2: 92 % O2 Device: O2 Device: Nasal Cannula O2 Flow Rate: O2 Flow Rate (L/min): 3 L/min Intake/output summary:  Intake/Output Summary (Last 24 hours) at 01/01/15 1044 Last data filed at 01/01/15 0749  Gross per 24 hour  Intake    240 ml  Output      0 ml  Net    240 ml   LBM: Last BM Date: 01/01/15 Baseline Weight: Weight: 70.1 kg (154 lb 8.7 oz) Most recent weight: Weight: 70.1 kg (154 lb 8.7 oz)  Exam Findings:  Constitutional:  ill, frail, and elderly.  Makes but does not keep eye contact.  Resp: even and non labored with supplemental oxygen via [place].  Cardio: Lower legs, pale, hairless and cold. Unable to palpate pedal pulses.  GI:  Abdomen soft, rounded, non tender.  Skin:  Multiple bruises along bl forearms.  Muscl:  Generalized muscle wasting.           Palliative Performance Scale:             3 months ago: 70% 2 weeks ago: 40% Today:  20%   Additional Data Reviewed: Recent Labs     12/31/14  1955  01/01/15  0259  WBC  15.3*  19.6*  HGB  12.2*  12.7*  PLT  148*  143*  NA  134*   --   BUN  154*   --   CREATININE  3.89*  3.78*     Time In: 0950 Time Out: 1130 Time Total: 90 minutes Greater than 50%  of this time was spent counseling and coordinating care related to the above assessment and plan. Discussion and plan shared with nursing staff, CM, SW and Advanced Heart failure team.    The Palliative medicine team has been able to develop a relationship with Clinton Reidinger and his family and this has been very helpful.  Thank you for early consults.    Signed by: Katheran Awe, NP  Katheran Awe, NP  01/01/2015, 10:44 AM  Please contact Palliative Medicine Team phone at [phone] for questions and concerns.

## 2015-01-01 NOTE — Clinical Social Work Note (Signed)
Clinical Social Work Assessment  Patient Details  Name: Clinton [name] MRN: [id number] Date of Birth: [birth year]  Date of referral:  01/01/15               Reason for consult:  Facility Placement                Permission sought to share information with:  Facility Medical sales representativeContact Representative, Family Supports Permission granted to share information::     Name::        Agency::     Relationship::     Contact Information:     Housing/Transportation Living arrangements for the past 2 months:  Skilled Building surveyorursing Facility Source of Information:  Adult Children Patient Interpreter Needed:  None Criminal Activity/Legal Involvement Pertinent to Current Situation/Hospitalization:  No - Comment as needed Significant Relationships:  Adult Children Lives with:  Facility Resident Do you feel safe going back to the place where you live?  Yes Need for family participation in patient care:  Yes (Comment)  Care giving concerns:  CSW notified family spoke with MD about comfort care and potentially residential hospice   Social Worker assessment / plan:  CSW familiar with pt and pt family from previous admission. During last admission, pt deferred to his sons during most conversations. Pt and pt son Clinton [name] speaking with palliative during CSW visit. Pt son Clinton [name] outside of pt room and spoke with CSW. Clinton [name] confirmed that family is leaning towards comfort care at residential hospice instead of returning to SNF but they are still discussing with palliative at this time. CSW briefly explained residential hospice referral process. Clinton [name] understanding but would like CSW to revisit after palliative discussion to confirm wishes. Clinton [name] was agreeable to CSW [facility] so they can begin to review pt information for eligibility.  Employment status:  Retired Database administratornsurance information:  Managed Medicare PT Recommendations:  Not assessed at this time Information / Referral to community  resources:  Other (Comment Required) (Residential Hospice)  Patient/Family's Response to care:  Randy calm during discussion with CSW. Still wanting to confirm pt wishes and wishes of other family members.  Patient/Family's Understanding of and Emotional Response to Diagnosis, Current Treatment, and Prognosis:  Pt son seems to have a good understanding of pt condition after speaking with MD this morning and during previous admission. Pt son with appropriate emotional response.  Emotional Assessment Appearance:  Appears older than stated age Attitude/Demeanor/Rapport:  Unable to Assess Affect (typically observed):  Unable to Assess Orientation:  Oriented to Self, Oriented to Place, Oriented to  Time, Oriented to Situation Alcohol / Substance use:  Not Applicable Psych involvement (Current and /or in the community):  No (Comment)  Discharge Needs  Concerns to be addressed:  Discharge Planning Concerns, Grief and Loss Concerns, Adjustment to Illness Readmission within the last 30 days:  Yes Current discharge risk:  Chronically ill, Terminally ill, Dependent with Mobility Barriers to Discharge:  Continued Medical Work up  [employer] Clancy [name], [employer] [phone]

## 2015-01-01 NOTE — Progress Notes (Signed)
   01/01/15 1100  Clinical Encounter Type  Visited With Patient;Family;Patient and family together;Health care provider  Visit Type Spiritual support;Social support;Psychological support  Referral From Nurse  Consult/Referral To Chaplain  Spiritual Encounters  Spiritual Needs Emotional;Prayer  Stress Factors  Patient Stress Factors Loss;Loss of control;Exhausted  Family Stress Factors Loss;Major life changes;Exhausted  Ch joined Palliative team in consult with family; CH followed Palliative consult with family and Pt visit; PT sitting up, tired but with full cognition; family (sons) were dealing with the comfort care decision and struggling with reality of situation: CH will follow-up; PT waiting on orders to transfer to hospice care.

## 2015-01-01 NOTE — Progress Notes (Signed)
Advanced Heart Failure Rounding Note  Subjective:    Says he just has not been feeling very good.  Gradually worse since discharge, family says the same. He says he just doesn't have any "get up and go."  Feels very fatigued. He denies SOB at rest, but has with minimal activity as well as Orthopnea.  No CP. Does have some swelling in his legs and "spots on his feet". Discharge weight 11/2814 160 lbs.  154.8 lbs today. No output recorded but has had UO as well as BM.  Objective:   Weight Range: 154 lb 8.7 oz (70.1 kg) Body mass index is 21.26 kg/(m^2).   Vital Signs:   Temp:  [97.6 F (36.4 C)] 97.6 F (36.4 C) (07/06 1933) Pulse Rate:  [78-93] 93 (07/07 0210) Resp:  [11-26] 11 (07/07 0210) BP: (55-141)/(16-89) 126/89 mmHg (07/07 0155) SpO2:  [77 %-100 %] 100 % (07/07 0210) Weight:  [154 lb 8.7 oz (70.1 kg)] 154 lb 8.7 oz (70.1 kg) (07/07 0210) Last BM Date: 01/01/15  Weight change: Filed Weights   01/01/15 0210  Weight: 154 lb 8.7 oz (70.1 kg)    Intake/Output:   Intake/Output Summary (Last 24 hours) at 01/01/15 0836 Last data filed at 01/01/15 0749  Gross per 24 hour  Intake    240 ml  Output      0 ml  Net    240 ml     Physical Exam: General: Pleasant, Chronically ill and Elderly appearing. Neuro: Alert and oriented X 3. Moves all extremities spontaneously. Psych: Affect depressed. HEENT: Normal Neck: Supple. Murmur radiating from severe AS.  JVP to ear seated in chair Lungs: Resp regular and unlabored, diminished bibasilarly. Heart: RRR no s3, s4, 2/6 AS, 2/6 MR Abdomen: Soft, non-tender, ++ distended, BS + x 4.  Extremities: No clubbing, cyanosis. 2+ Edema to knees bilaterally.  Petechiae bilaterally feet, L > R.   Telemetry:  NSR in 80s-90s, LBBB similar to previous tracings  Labs: CBC  Recent Labs  12/31/14 1955 01/01/15 0259  WBC 15.3* 19.6*  NEUTROABS  --  17.6*  HGB 12.2* 12.7*  HCT 38.8* 39.6  MCV 79.8 80.8  PLT 148* 143*   Basic  Metabolic Panel  Recent Labs  12/31/14 1955  NA 134*  K 5.9*  CL 93*  CO2 21*  GLUCOSE 165*  BUN 154*  CALCIUM 8.4*   Liver Function Tests  Recent Labs  12/31/14 2321  AST 87*  ALT 46  ALKPHOS 280*  BILITOT 1.9*  PROT 6.9  ALBUMIN 3.3*   No results for input(s): LIPASE, AMYLASE in the last 72 hours. Cardiac Enzymes No results for input(s): CKTOTAL, CKMB, CKMBINDEX, TROPONINI in the last 72 hours.  BNP: BNP (last 3 results)  Recent Labs  12/03/14 1909 12/31/14 1955  BNP >4500.0* >45000.0*    ProBNP (last 3 results)  Recent Labs  05/10/14 0935  PROBNP 63888.0*     D-Dimer No results for input(s): DDIMER in the last 72 hours. Hemoglobin A1C No results for input(s): HGBA1C in the last 72 hours. Fasting Lipid Panel No results for input(s): CHOL, HDL, LDLCALC, TRIG, CHOLHDL, LDLDIRECT in the last 72 hours. Thyroid Function Tests No results for input(s): TSH, T4TOTAL, T3FREE, THYROIDAB in the last 72 hours.  Invalid input(s): FREET3  Other results:     Imaging/Studies:  Dg Chest Port 1 View  12/31/2014   CLINICAL DATA:  Shortness of breath and weakness today.  EXAM: PORTABLE CHEST - 1 VIEW  COMPARISON:  PA and lateral chest 12/18/2014.  FINDINGS: There is marked cardiomegaly. Interstitial edema seen on the prior examination appears improved. Small right effusion is not notably changed. Stress a small bilateral pleural effusions, greater on the right are not notably changed. No pneumothorax identified.  IMPRESSION: Cardiomegaly without edema.  No change in small bilateral pleural effusions, larger on the right.   Electronically Signed   By: Drusilla Kanner M.D.   On: 12/31/2014 20:12     Latest Echo  Latest Cath   Medications:     Scheduled Medications: . aspirin EC  81 mg Oral Daily  . clopidogrel  75 mg Oral Daily  . heparin  5,000 Units Subcutaneous 3 times per day  . levothyroxine  75 mcg Oral QAC breakfast  . predniSONE  40 mg Oral Q  breakfast  . rosuvastatin  40 mg Oral Daily  . sodium chloride  3 mL Intravenous Q12H  . tiotropium  18 mcg Inhalation Daily     Infusions:     PRN Medications:  sodium chloride, acetaminophen, ALPRAZolam, docusate sodium, ondansetron (ZOFRAN) IV, oxyCODONE-acetaminophen, sodium chloride   Assessment/Plan   1 Acute on chronic combined CHF, ECHO 12/04/14 EF 30-35% Grade 2 diastolic dysfunction - Low output state - More normotensive this morning. - Labs on admission K5.9, Cr 3.89, WBC 15.4. Plt 148 BNP >45,000, Trop 7.73  - Pt and family want him to be comfortable  - Very slight improvement of renal function with 1 dose IV lasix last night. - Will consult MD concerning optimal diuresis in setting of CKD and End Stage HF.  Was on Torsemide 40 mg daily at home - No inotropic support given DNR status  2. Elevated trop. - Likely in setting of low output , AS and known CAD - Follow trends  - INR 1.58.  - On heparin with sub-therapeutic INR 3. ASSevere  - Saw together with Dr Excell Seltzer last admission. Not a candidate for intervention. 4.AKI on CKD  - Cr up to 3.78 from discharge Cr of 2.5  5 Hx of CVA  - On plavix.  6. DNR - Prognosis is poor.  - Family wants to know if pt can have any diet restrictions.  - Hospice/palliative care to see this morning ? [facility]     Length of Stay:    Luane School 01/01/2015, 8:36 AM  Advanced Heart Failure Team Pager [phone] (M-F; 7a - 4p)  Please contact [facility] for night-coverage after hours (4p -7a ) and weekends on [email or web address]  Patient seen with PA, agree with the above note.  More stable this morning. Family at bedside.  Denies dyspnea currently.  Xanax helping with anxiety.  Long discussion with family about current situation.  End-stage CHF with severe aortic stenosis and progressive renal failure.  We do not have further life-prolonging therapies to offer here.  Family and patient  understand.  Plan to involve palliative care service, possibly he could go to [facility].  For now will give Lasix 80 mg IV bid to try to keep fluid down.    Marca Ancona 01/01/2015 9:36 AM

## 2015-01-12 ENCOUNTER — Encounter (HOSPITAL_COMMUNITY): Payer: Medicare HMO

## 2015-01-19 NOTE — Progress Notes (Signed)
HPI: FU CAD s/p CABG, PAD s/p Ao-bifem bypass and Ao-R renal bypass, carotid stenosis s/p bilateral CEA, LICA and L CCA stenting, HTN, HL, ICM, systolic HF, aortic stenosis, hypothyroidism, prior CVA, CKD. PAD is followed by [facility]. He was seen by Dr. Hillis Range in 2012 and declined ICD implantation.  Patient with recent admissions for congestive heart failure/cardiogenic shock, cardiac arrest. Also noted to have progressive renal insufficiency. Felt not to be a candidate for TAVR; seen by CHF clinic and no additional options for therapy felt possible. Patient made no CODE BLUE. Palliative care saw patient as well. Patient transferred to hospice care.    Studies/Reports Reviewed Today:  Echo 6/16 Ejection fraction 30-35%, grade 2 diastolic dysfunction, moderate aortic stenosis, trace aortic insufficiency, moderate mitral regurgitation, moderate left atrial enlargement, moderate right atrial enlargement, severe tricuspid regurgitation, mild pulmonic insufficiency and severely elevated pulmonary pressures.  Renal Artery [place] 12/13 R renal artery bypass patent L renal artery origin severe stenosis  Myoview 7/11 Inferior and lateral infarct with minimal ischemia in mid lateral wall and base of anterior wall, EF 29%  Current Outpatient Prescriptions  Medication Sig Dispense Refill  . acetaminophen (TYLENOL) 500 MG tablet Take 1,000 mg by mouth every 8 (eight) hours as needed (pain).    Marland Kitchen ALPRAZolam (XANAX) 0.25 MG tablet Take 1 tablet (0.25 mg total) by mouth 2 (two) times daily as needed for anxiety. 60 tablet 0  . Alum & Mag Hydroxide-Simeth (MAGIC MOUTHWASH W/LIDOCAINE) SOLN Take 5 mLs by mouth 4 (four) times daily.  0  . clopidogrel (PLAVIX) 75 MG tablet Take 1 tablet (75 mg total) by mouth daily. 30 tablet 6  . docusate sodium (COLACE) 100 MG capsule Take 1 capsule (100 mg total) by mouth 2 (two) times daily as needed for mild constipation. 10 capsule 0  . levothyroxine  (SYNTHROID, LEVOTHROID) 75 MCG tablet Take 1 tablet (75 mcg total) by mouth daily before breakfast. 30 tablet 12  . oxyCODONE (ROXICODONE) 5 MG/5ML solution Take 5 mLs (5 mg total) by mouth every 4 (four) hours as needed for moderate pain, severe pain or breakthrough pain. 15 mL 0  . predniSONE (DELTASONE) 20 MG tablet Take 40 mg by mouth daily with breakfast.    . torsemide (DEMADEX) 20 MG tablet Take 2 tablets (40 mg total) by mouth daily. 60 tablet 6  . Umeclidinium Bromide (INCRUSE ELLIPTA) 62.5 MCG/INH AEPB Inhale 1 puff into the lungs daily.     No current facility-administered medications for this visit.     Past Medical History  Diagnosis Date  . CAD (coronary artery disease)     a. CABG 1985;  b. 05/2002 Rotablator/PTCA to ostial LCX and RI;  c. 12/2009 MV: inf and lat infarct w/ minimal mid lateral and basilar ant ischemia, EF 29%->Med Rx.  . Cardiomyopathy, ischemic     a. 06/2010 Cardiac MRI: EF 30%, anterolat, post, inf prior subendocardial infarcts;  b. 08/2012 Echo: EF 30-35%, mild LVH mult wma's, Gr 2 DD;  c. 05/2013 Echo: EF 20-25%, diff HK, mod AS, mild MR, mod dil LA, mild to mod TR, PASP .  . Stroke     remote  . Hypertension   . Hyperlipidemia   . Tobacco abuse   . Anticoagulated on Coumadin   . Chronic systolic CHF (congestive heart failure)     a. 05/2013 EF 20-25%, diff HK.  . Moderate aortic stenosis     a. 05/2013 Echo: at least moderate AS (  Valve area 1.07 cm2 - VTI; 1.08cm2 - Vmax).  . Hypothyroidism     a. 05/2013 TSH 50.15 - synthroid 50 mcg daily started.  . Peripheral arterial disease     a. Ao-bifem bypass w/ 16x8 hemashield dacron graft, R aortorenal bypass w/ 6mm dacron graft;  b. 09/2011 ABI: R 0/87, L 0.92.  . Carotid artery occlusion     a. 12/2002 R CEA;  b. 05/2007 L Carotid stenting;  c. 09/2008 repeat L carotid stenting 2/2 ISR;  d. 09/2011 Carotid U/S: RICA 40-59%, LICA 60-79%, >50 dist LCCA stenosis.  . CKD (chronic kidney disease), stage III    . Myocardial infarction 12/2009    MV: inf and lat infarct w/ minimal mid lateral and basilar ant ischemia, EF 29%->Med Rx  . Myocardial infarction 1985    "that's why I had the bypass"  . COPD (chronic obstructive pulmonary disease)   . Pneumonia 05/2014  . Arthritis     "hands" (12/03/2014)  . 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 Carotid endarterectomy Right 12/2002       . Aorta - bilateral femoral artery bypass graft  2004    Dr. Madilyn Fireman  . Aortic/renal bypass Right 2004    Dr. Madilyn Fireman  . Carotid stent Left 05/2007; 09/2008       . Coronary angioplasty with stent placement  05/2002    Rotablator/PTCA to ostial LCX and RI  . Inguinal hernia repair Right   . Back surgery    . Lumbar disc surgery  X 3?  Marland Kitchen Coronary artery bypass graft  1985    "CABG X5"    History   Social History  . Marital Status: Divorced    Spouse Name: N/A  . Number of Children: N/A  . Years of Education: N/A   Occupational History  . Not on file.   Social History Main Topics  . Smoking status: Former Smoker -- 1.00 packs/day for 50 years    Types: Cigarettes    Quit date: 11/26/2014  . Smokeless tobacco: Never Used  . Alcohol Use: No  . Drug Use: No  . Sexual Activity: No   Other Topics Concern  . Not on file   Social History Narrative   Lives in [place] with his son.  They eat out often and otherwise eat a lot of prepared/processed/microwave-type meals.  He continues to smoke a few cigarettes/day.    ROS: no fevers or chills, productive cough, hemoptysis, dysphasia, odynophagia, melena, hematochezia, dysuria, hematuria, rash, seizure activity, orthopnea, PND, pedal edema, claudication. Remaining systems are negative.  Physical Exam: Well-developed well-nourished in no acute distress.  Skin is warm and dry.  HEENT is normal.  Neck is supple.  Chest is clear to auscultation with normal expansion.    Cardiovascular exam is regular rate and rhythm.  Abdominal exam nontender or distended. No masses palpated. Extremities show no edema. neuro grossly intact  ECG     This encounter was created in error - please disregard.

## 2015-01-20 ENCOUNTER — Encounter: Payer: Medicare HMO | Admitting: Cardiology

## 2015-01-21 ENCOUNTER — Encounter: Payer: Self-pay | Admitting: Cardiology

## 2015-01-22 ENCOUNTER — Encounter: Payer: Self-pay | Admitting: Internal Medicine

## 2015-01-26 DEATH — deceased

## 2015-02-12 ENCOUNTER — Encounter: Payer: Self-pay | Admitting: Cardiology

## 2015-02-18 ENCOUNTER — Encounter: Payer: Self-pay | Admitting: Endocrinology

## 2015-03-11 ENCOUNTER — Telehealth: Payer: Self-pay | Admitting: Adult Health

## 2015-03-11 NOTE — Telephone Encounter (Signed)
Pt cancelled CT chest to follow lung nodule  Can we call and get rescheduled with follow up ov with MD after to dsicuss results.  CT chest w/o contrast follow up lung nodule

## 2015-03-11 NOTE — Telephone Encounter (Signed)
Attempted to call pt on home and mobile number Unable to LVM on either numbers Phone stated customer was not excepting phone calls at this time.

## 2015-03-16 NOTE — Telephone Encounter (Signed)
Attempted to call pt  Unable to LVM Services states he is not excepting phone calls

## 2015-03-19 NOTE — Telephone Encounter (Signed)
Attempted to call pt Unable to LVM 

## 2015-03-25 NOTE — Telephone Encounter (Signed)
ATC PT. Received message the person I am trying to reach is not accepting calls at this time, [facility]

## 2015-03-26 NOTE — Telephone Encounter (Signed)
Pt is currently living in [facility] so Shanda Bumps is his contact person there for appointments etc.. [phone] LVM for Shanda Bumps to return call

## 2015-03-27 NOTE — Telephone Encounter (Signed)
Pt son returning call and state that father passed away in 02/07/2023 can be reached @ [phone].Caren Griffins

## 2015-03-27 NOTE — Telephone Encounter (Signed)
Shanda Bumps w/ [name] returned call Pt is no longer living at that facility and is in the care of his son Clinton [name] @ [phone]  [facility] TCB x1 for pt's son [name] to reschedule the CT Chest w/o contrast to follow up lung nodule

## 2015-03-27 NOTE — Telephone Encounter (Signed)
Will forward to [place] as FYI

## 2015-03-30 NOTE — Telephone Encounter (Signed)
Noted, thank you. Will forward to TP to make her

## 2016-10-01 IMAGING — CT CT CERVICAL SPINE W/O CM
3 of 5 series · 12 of 33 positions shown, 14 images · non-contrast
Comparison: None.

CLINICAL DATA: Syncopal episode today.  Fell and hit head.

EXAM:
CT HEAD WITHOUT CONTRAST
CT CERVICAL SPINE WITHOUT CONTRAST
TECHNIQUE: Multidetector CT imaging of the head and cervical spine was
performed following the standard protocol without intravenous
contrast. Multiplanar CT image reconstructions of the cervical spine
were also generated.

[Series 7: coronals · coronal · 0.29mm/px · 3 of 48 slices shown]
[im 10/48  bone]
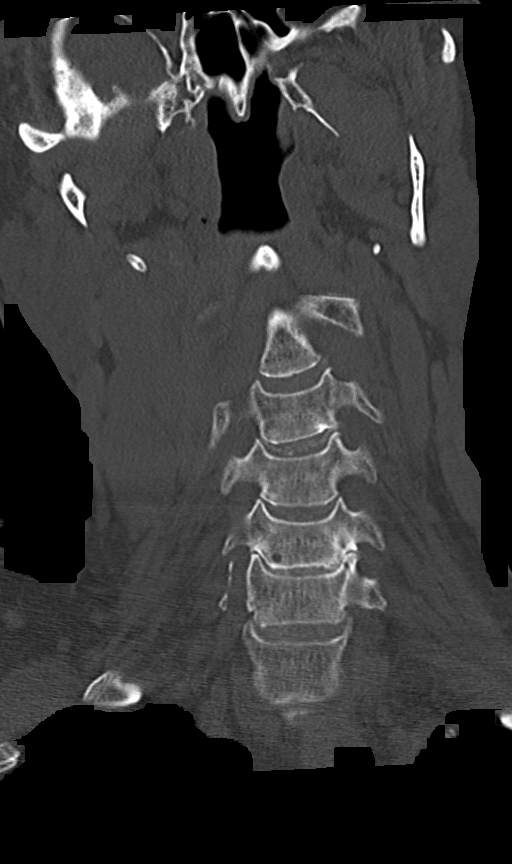
[im 19/48  bone]
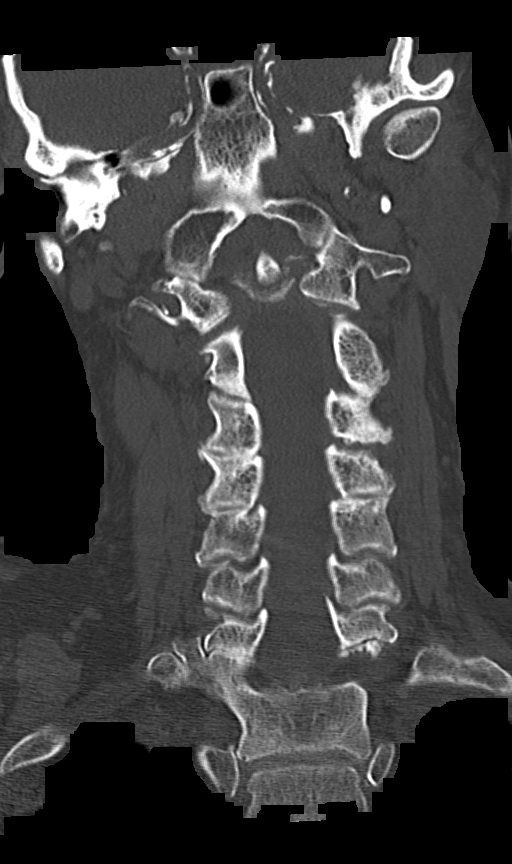
[im 29/48  bone]
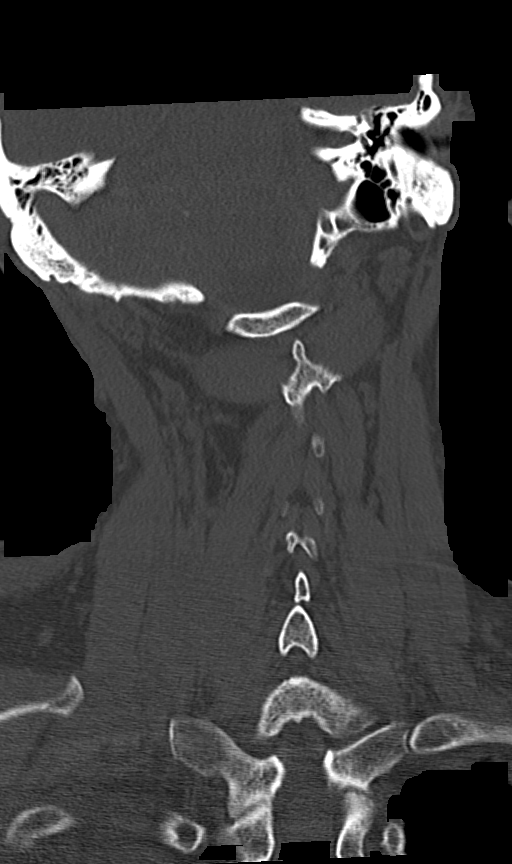

[Series 8: sagittals · sagittal · 0.30mm/px · 5 of 61 slices shown, 6 images]
[im 21/61  bone]
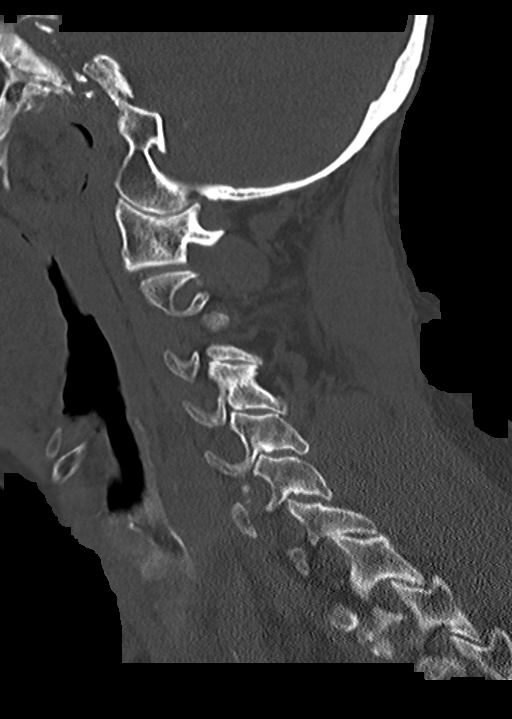
[im 26/61  bone]
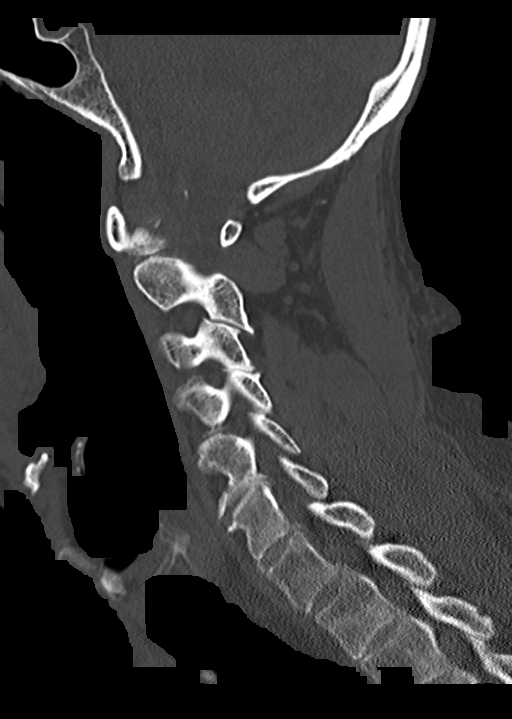
[im 31/61  soft-tissue]
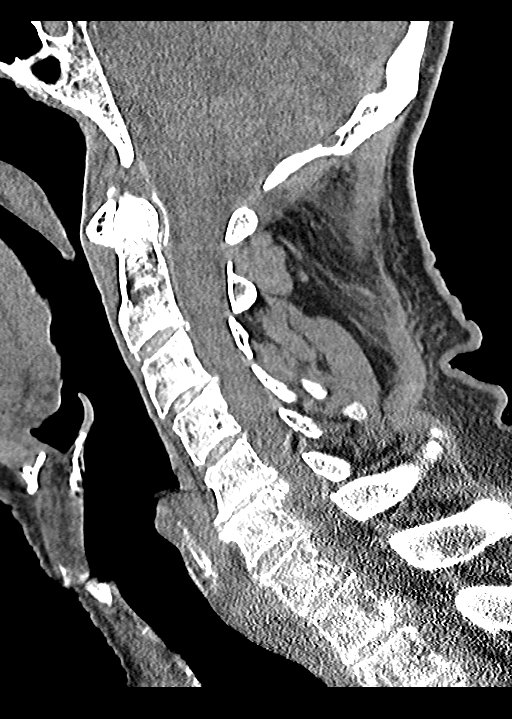
[im 31/61  bone]
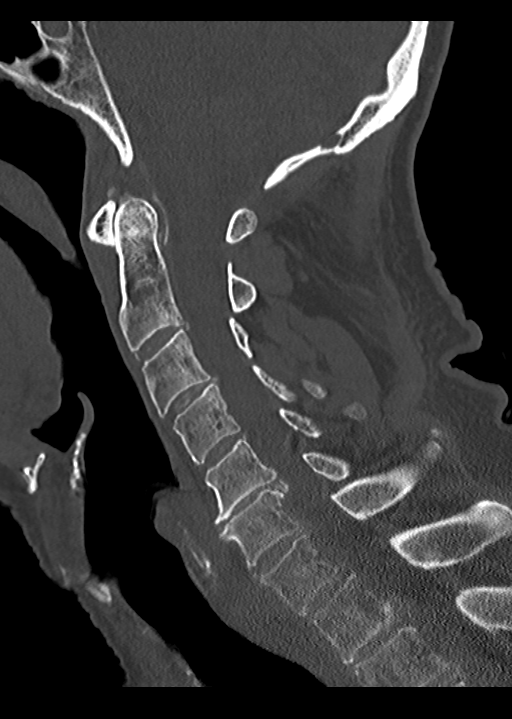
[im 36/61  bone]
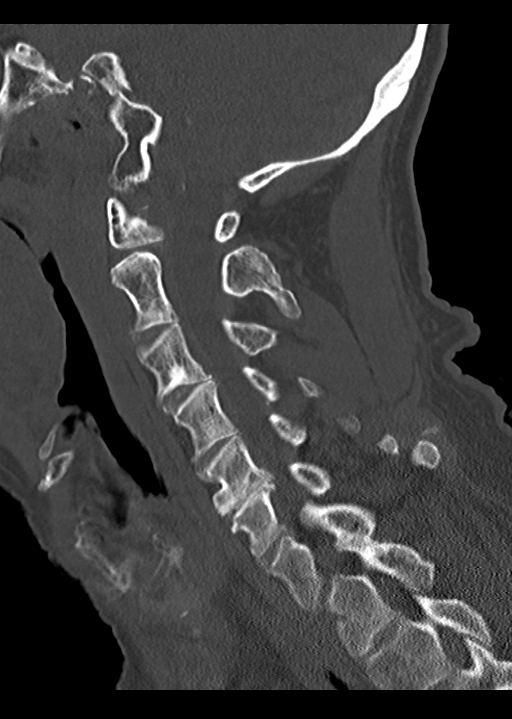
[im 41/61  bone]
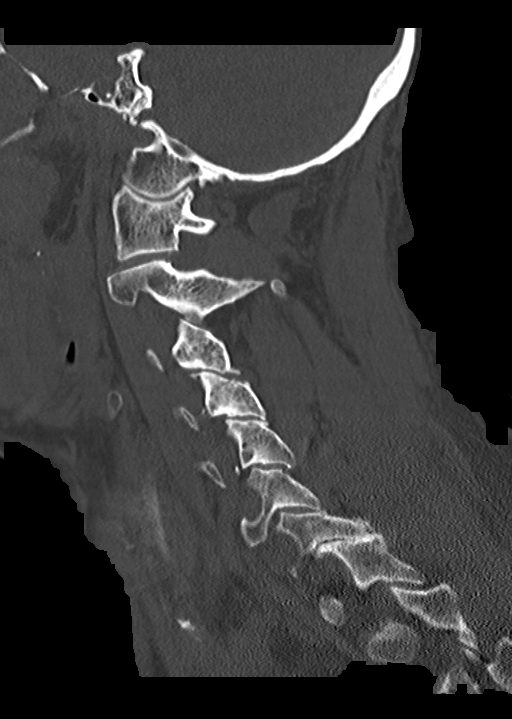

[Series 9: orthogonals · axial · 0.23mm/px · z∈[-318,-175]mm · 4 of 127 slices shown, 5 images]
[im 22/127  soft-tissue]
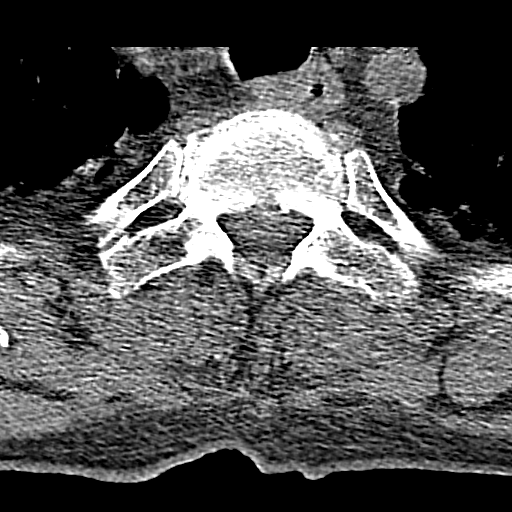
[im 22/127  bone]
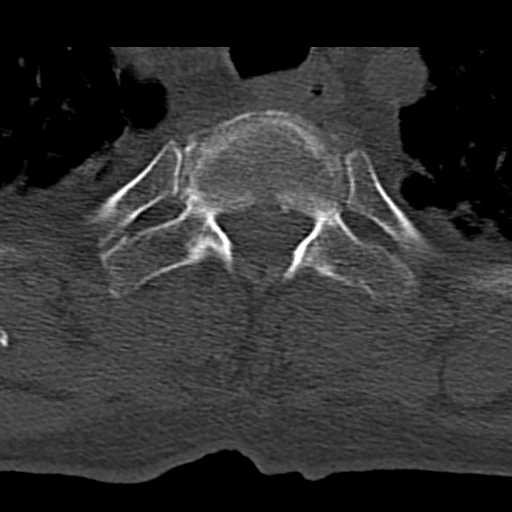
[im 43/127  bone]
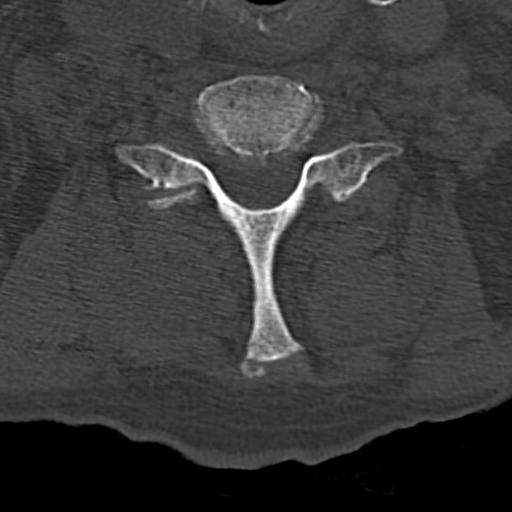
[im 85/127  bone]
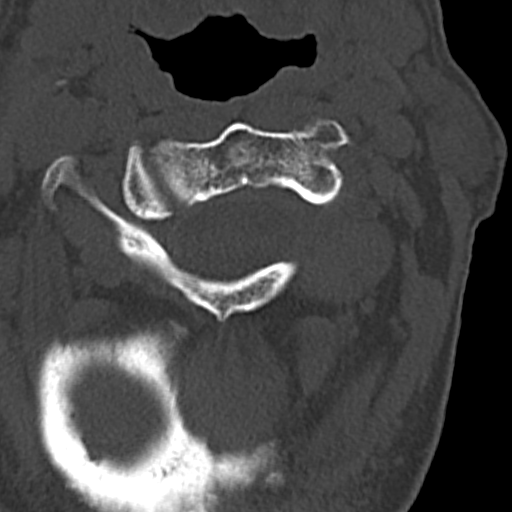
[im 106/127  bone]
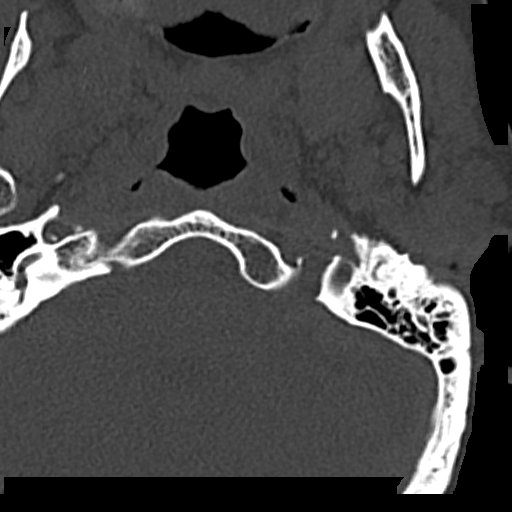

[12 of 33 positions shown; findings below may reference images not displayed]

FINDINGS: CT HEAD FINDINGS

The ventricles are in the midline without mass effect or shift. They
are within normal limits in size and configuration given the degree
of cerebral atrophy. There are remote appearing infarcts noted in
the right posterior parietal region, right occipital region and
right cerebellum. No findings for acute hemispheric infarction an or
intracranial hemorrhage. No extra-axial fluid collections.

No acute skull fracture is identified. The paranasal sinuses and
mastoid air cells are clear. The globes are intact.

CT CERVICAL SPINE FINDINGS

Degenerative cervical spondylosis with multilevel disc disease and
facet disease. No acute cervical spine fracture. The facets are
normally aligned. The skullbase C1 and C1-2 articulations are
maintained. The dens is intact. No abnormal prevertebral soft tissue
swelling. Mild bony foraminal stenosis at C3-4 and left due to
uncinate spurring and facet disease. Similar findings at C5-6.

Emphysematous changes are noted at the lung apices along with
scarring changes. A left carotid artery stent is noted.
IMPRESSION: 1. Remote appearing right-sided posterior parietal, occipital and
cerebellar infarcts. No definite acute hemispheric infarction or
intracranial hemorrhage. No mass lesions or extra-axial fluid
collections.
2. No acute skull fracture.
3. Degenerative cervical spondylosis with multilevel disc disease
and facet disease but no acute cervical spine fracture.
